# Patient Record
Sex: Female | Born: 1935 | Smoking: Former smoker
Health system: Southern US, Community
[De-identification: ages and names within clinical notes are randomized; demographics above are authoritative.]

## PROBLEM LIST (undated history)

## (undated) DIAGNOSIS — E785 Hyperlipidemia, unspecified: Secondary | ICD-10-CM

## (undated) DIAGNOSIS — I82409 Acute embolism and thrombosis of unspecified deep veins of unspecified lower extremity: Secondary | ICD-10-CM

## (undated) DIAGNOSIS — K5900 Constipation, unspecified: Secondary | ICD-10-CM

## (undated) DIAGNOSIS — I10 Essential (primary) hypertension: Secondary | ICD-10-CM

## (undated) DIAGNOSIS — M1990 Unspecified osteoarthritis, unspecified site: Secondary | ICD-10-CM

## (undated) DIAGNOSIS — E039 Hypothyroidism, unspecified: Secondary | ICD-10-CM

## (undated) DIAGNOSIS — I509 Heart failure, unspecified: Secondary | ICD-10-CM

## (undated) DIAGNOSIS — C541 Malignant neoplasm of endometrium: Secondary | ICD-10-CM

## (undated) HISTORY — PX: DILATION AND CURETTAGE OF UTERUS: SHX78

## (undated) HISTORY — DX: Unspecified osteoarthritis, unspecified site: M19.90

## (undated) HISTORY — DX: Hyperlipidemia, unspecified: E78.5

## (undated) HISTORY — DX: Malignant neoplasm of endometrium: C54.1

## (undated) HISTORY — PX: TONSILLECTOMY: SUR1361

---

## 2015-11-18 ENCOUNTER — Ambulatory Visit: Payer: BLUE CROSS/BLUE SHIELD | Admitting: Family

## 2016-01-27 ENCOUNTER — Ambulatory Visit: Payer: BLUE CROSS/BLUE SHIELD | Admitting: Family

## 2016-01-27 NOTE — H&P (Signed)
HPI:  Vickie Barton is a 82 y.o. [id number] here for female here for surgery. . Chief Complaint: post menopausal bleeding             Location:  uterus            Quality:  Heavy bleeding with clots            Severity:  moderate            Duration:  month            Timing:  Constant now            Modifying factors: nothing            Associated signs and symptoms:  Endorses weight loss, cramping, back pain,             Context:   Vickie Barton presented with first episode of PMB a couple weeks ago and it has progressively worsened.  She has lost 9 lbs in the last month.  She at first declined any intervention, but has since changed her mind as the cramping and pain have become bothersome.    She is s/p sonohystogram and endometrial biopsy.  Due to worsening of her symptoms, however, I would like to offer her a D&C.  Patient's last menstrual period was 1975               Problem List  Never Reviewed         Codes Priority Class Noted - Resolved   PMB (postmenopausal bleeding) ICD-10-CM: N95.0 ICD-9-CM: 627.1   Unknown - Present       Past Medical History:  has a past medical history of Hyperlipidemia, unspecified; Hypertension; and Thyroid disease.  Past Surgical History:  has a past surgical history that includes tonsillectomy. Family History: family history includes Diabetes mellitus in her father and paternal grandmother; Heart disease in her father and mother; Hypertension in her brother, father, mother, and sister; Stroke in her mother; Thyroid disease in her sister. Social History:  reports that she has quit smoking. She has never used smokeless tobacco. She reports that she does not drink alcohol or use drugs. OB/GYN History:          OB History    Gravida Para Term Preterm AB Living   5       5     SAB TAB Ectopic Molar Multiple Live Births   5                Allergies: is allergic to sulfa (sulfonamide antibiotics) and  zinc. Medications:  Current Outpatient Prescriptions:  .  ACETAMINOPHEN (TYLENOL ARTHRITIS ORAL), Take by mouth., Disp: , Rfl:  .  amLODIPine-benazepril (LOTREL) 5-20 mg capsule, Take 1 capsule by mouth once daily., Disp: , Rfl:  .  aspirin 81 MG EC tablet, Take 81 mg by mouth once daily., Disp: , Rfl:  .  hydroCHLOROthiazide (HYDRODIURIL) 25 MG tablet, Take 25 mg by mouth once daily., Disp: , Rfl:  .  levothyroxine (SYNTHROID, LEVOTHROID) 88 MCG tablet, Take 88 mcg by mouth once daily. Take on an empty stomach with a glass of water at least 30-60 minutes before breakfast., Disp: , Rfl:   Review of Systems: General:                      No fatigue or weight loss Eyes:  No vision changes Ears:                            No hearing difficulty Respiratory:                No cough or shortness of breath Pulmonary:                  No asthma or shortness of breath Cardiovascular:           No chest pain, palpitations, dyspnea on exertion Gastrointestinal:          No abdominal bloating, chronic diarrhea, constipations, masses, pain or hematochezia Genitourinary:             No hematuria, dysuria Lymphatic:                   No swollen lymph nodes Musculoskeletal:         No muscle weakness Neurologic:                  No extremity weakness, syncope, seizure disorder Psychiatric:                  No history of depression, delusions or suicidal/homicidal ideation    Exam:      Vitals:   01/27/16 1351  BP: 131/78  Pulse: 72    Body mass index is 33.63 kg/m.  WDWN white female in NAD   Lungs: normal respiratory effort, CTABL CV: RRR ABD: non tender, non distended Pelvic: tanner stage 5 ,              External genitalia: vulva /labia no lesions             Urethra: no prolapse, no diverticulum, no caruncle             Bladder: no tenderness to palpation, no cystocele             Vagina: normal physiologic d/c, no lesions, normal apical support              Cervix: no lesions, no cervical motion tenderness                 Ultrasound:  UT: 9x7x6cm with EE 64mm, heterogeneousm and with subserosal multiple fibroids LO: 4x2x2cm, RO 2x2x1cm No free fluid Poor distention with saline Endometrial biopsy collected.               Impression:   The encounter diagnosis was PMB (postmenopausal bleeding).    Plan:   - Given the thickness of the endometrium and the patient's current symptoms, I have offered her a D&C, hysteroscopy - I will also start her on Provera following her procedure -  The patient and I discussed the technical aspects of the procedure including the potential for risks and complications.These include but are not limited to the risk of infection requiring post-operative antibiotics or further procedures.We talked about the risk of injury to adjacent organs including bladder, bowel, ureter, blood vessels or nerves.We talked about the possibleneed for blood transfusion.We talked aboutpostop complications such asthromboembolic or cardiopulmonary complications.All of her questions were answered. Her preoperative exam was completed andthe appropriate consents were signed.     CHELSEA CRIST WARD, MD

## 2016-01-28 ENCOUNTER — Ambulatory Visit: Payer: Medicare Other | Admitting: Anesthesiology

## 2016-01-28 ENCOUNTER — Ambulatory Visit
Admission: RE | Admit: 2016-01-28 | Discharge: 2016-01-28 | Disposition: A | Discharge status: Home / Self Care | Payer: Medicare Other | Source: Ambulatory Visit | Attending: Obstetrics & Gynecology | Admitting: Obstetrics & Gynecology

## 2016-01-28 ENCOUNTER — Encounter: Payer: Self-pay | Admitting: *Deleted

## 2016-01-28 ENCOUNTER — Encounter
Admission: RE | Disposition: A | Discharge status: Home / Self Care | Payer: Self-pay | Source: Ambulatory Visit | Attending: Obstetrics & Gynecology

## 2016-01-28 DIAGNOSIS — Z7982 Long term (current) use of aspirin: Secondary | ICD-10-CM | POA: Insufficient documentation

## 2016-01-28 DIAGNOSIS — Z823 Family history of stroke: Secondary | ICD-10-CM | POA: Insufficient documentation

## 2016-01-28 DIAGNOSIS — N840 Polyp of corpus uteri: Secondary | ICD-10-CM | POA: Diagnosis not present

## 2016-01-28 DIAGNOSIS — I10 Essential (primary) hypertension: Secondary | ICD-10-CM | POA: Insufficient documentation

## 2016-01-28 DIAGNOSIS — Z833 Family history of diabetes mellitus: Secondary | ICD-10-CM | POA: Insufficient documentation

## 2016-01-28 DIAGNOSIS — E039 Hypothyroidism, unspecified: Secondary | ICD-10-CM | POA: Insufficient documentation

## 2016-01-28 DIAGNOSIS — Z882 Allergy status to sulfonamides status: Secondary | ICD-10-CM | POA: Diagnosis not present

## 2016-01-28 DIAGNOSIS — Z888 Allergy status to other drugs, medicaments and biological substances status: Secondary | ICD-10-CM | POA: Diagnosis not present

## 2016-01-28 DIAGNOSIS — E785 Hyperlipidemia, unspecified: Secondary | ICD-10-CM | POA: Diagnosis not present

## 2016-01-28 DIAGNOSIS — Z8249 Family history of ischemic heart disease and other diseases of the circulatory system: Secondary | ICD-10-CM | POA: Diagnosis not present

## 2016-01-28 DIAGNOSIS — N950 Postmenopausal bleeding: Secondary | ICD-10-CM | POA: Insufficient documentation

## 2016-01-28 DIAGNOSIS — Z87891 Personal history of nicotine dependence: Secondary | ICD-10-CM | POA: Insufficient documentation

## 2016-01-28 DIAGNOSIS — C541 Malignant neoplasm of endometrium: Secondary | ICD-10-CM

## 2016-01-28 HISTORY — PX: HYSTEROSCOPY WITH D & C: SHX1775

## 2016-01-28 HISTORY — DX: Malignant neoplasm of endometrium: C54.1

## 2016-01-28 HISTORY — DX: Hypothyroidism, unspecified: E03.9

## 2016-01-28 HISTORY — DX: Essential (primary) hypertension: I10

## 2016-01-28 LAB — TYPE AND SCREEN
ABO/RH(D): AB POS
Antibody Screen: NEGATIVE

## 2016-01-28 LAB — BASIC METABOLIC PANEL
Anion gap: 10 (ref 5–15)
BUN: 14 mg/dL (ref 6–20)
CHLORIDE: 98 mmol/L — AB (ref 101–111)
CO2: 30 mmol/L (ref 22–32)
CREATININE: 0.82 mg/dL (ref 0.44–1.00)
Calcium: 8.8 mg/dL — ABNORMAL LOW (ref 8.9–10.3)
GFR calc Af Amer: >60 mL/min (ref >60)
GFR calc non Af Amer: >60 mL/min (ref >60)
GLUCOSE: 106 mg/dL — AB (ref 65–99)
POTASSIUM: 3.1 mmol/L — AB (ref 3.5–5.1)
SODIUM: 138 mmol/L (ref 135–145)

## 2016-01-28 LAB — CBC
HCT: 36.1 % (ref 35.0–47.0)
Hemoglobin: 12.6 g/dL (ref 12.0–16.0)
MCH: 30.7 pg (ref 26.0–34.0)
MCHC: 35 g/dL (ref 32.0–36.0)
MCV: 87.9 fL (ref 80.0–100.0)
PLATELETS: 392 10*3/uL (ref 150–440)
RBC: 4.1 MIL/uL (ref 3.80–5.20)
RDW: 13.9 % (ref 11.5–14.5)
WBC: 6.4 10*3/uL (ref 3.6–11.0)

## 2016-01-28 SURGERY — DILATATION AND CURETTAGE /HYSTEROSCOPY
Anesthesia: General | Wound class: Clean Contaminated

## 2016-01-28 MED ORDER — FENTANYL CITRATE (PF) 100 MCG/2ML IJ SOLN
INTRAMUSCULAR | Status: AC
  Filled 2016-01-28: qty 2

## 2016-01-28 MED ORDER — LIDOCAINE HCL (PF) 1 % IJ SOLN
INTRAMUSCULAR | Status: AC
  Filled 2016-01-28: qty 30

## 2016-01-28 MED ORDER — MEDROXYPROGESTERONE ACETATE 10 MG PO TABS: 10.0000 mg | ORAL_TABLET | Freq: Two times a day (BID) | ORAL | 1 refills | Status: DC | PRN

## 2016-01-28 MED ORDER — LACTATED RINGERS IV SOLN
INTRAVENOUS | Status: DC | PRN
  Administered 2016-01-28: 10:00:00 via INTRAVENOUS

## 2016-01-28 MED ORDER — FENTANYL CITRATE (PF) 100 MCG/2ML IJ SOLN
25.0000 ug | INTRAMUSCULAR | Status: DC | PRN
  Administered 2016-01-28 (×4): 25 ug via INTRAVENOUS

## 2016-01-28 MED ORDER — ONDANSETRON HCL 4 MG/2ML IJ SOLN
INTRAMUSCULAR | Status: DC | PRN
  Administered 2016-01-28: 4 mg via INTRAVENOUS

## 2016-01-28 MED ORDER — LIDOCAINE HCL (CARDIAC) 20 MG/ML IV SOLN
INTRAVENOUS | Status: DC | PRN
  Administered 2016-01-28: 80 mg via INTRAVENOUS

## 2016-01-28 MED ORDER — OXYCODONE HCL 5 MG/5ML PO SOLN: 5.0000 mg | Freq: Once | ORAL | Status: DC | PRN

## 2016-01-28 MED ORDER — PROMETHAZINE HCL 25 MG/ML IJ SOLN
6.2500 mg | INTRAMUSCULAR | Status: DC | PRN
Start: 2016-01-28 — End: 2016-01-28

## 2016-01-28 MED ORDER — DEXAMETHASONE SODIUM PHOSPHATE 10 MG/ML IJ SOLN
INTRAMUSCULAR | Status: DC | PRN
  Administered 2016-01-28: 10 mg via INTRAVENOUS

## 2016-01-28 MED ORDER — MEPERIDINE HCL 25 MG/ML IJ SOLN: 6.2500 mg | INTRAMUSCULAR | Status: DC | PRN

## 2016-01-28 MED ORDER — PROPOFOL 10 MG/ML IV BOLUS
INTRAVENOUS | Status: DC | PRN
  Administered 2016-01-28: 150 mg via INTRAVENOUS

## 2016-01-28 MED ORDER — FENTANYL CITRATE (PF) 100 MCG/2ML IJ SOLN
INTRAMUSCULAR | Status: DC | PRN
  Administered 2016-01-28: 25 ug via INTRAVENOUS
  Administered 2016-01-28: 50 ug via INTRAVENOUS
  Administered 2016-01-28: 25 ug via INTRAVENOUS

## 2016-01-28 MED ORDER — OXYCODONE HCL 5 MG PO TABS: 5.0000 mg | ORAL_TABLET | Freq: Once | ORAL | Status: DC | PRN

## 2016-01-28 MED ORDER — KETOROLAC TROMETHAMINE 30 MG/ML IJ SOLN
INTRAMUSCULAR | Status: DC | PRN
  Administered 2016-01-28: 30 mg via INTRAVENOUS

## 2016-01-28 SURGICAL SUPPLY — 17 items
BAG INFUSER PRESSURE 100CC (MISCELLANEOUS) ×3 IMPLANT
ELECT REM PT RETURN 9FT ADLT (ELECTROSURGICAL) ×3
ELECTRODE REM PT RTRN 9FT ADLT (ELECTROSURGICAL) ×1 IMPLANT
GLOVE PI ORTHOPRO 6.5 (GLOVE) ×2
GLOVE PI ORTHOPRO STRL 6.5 (GLOVE) ×1 IMPLANT
GLOVE SURG SYN 6.5 ES PF (GLOVE) ×3 IMPLANT
GOWN STRL REUS W/ TWL LRG LVL3 (GOWN DISPOSABLE) ×2 IMPLANT
GOWN STRL REUS W/TWL LRG LVL3 (GOWN DISPOSABLE) ×4
IV LACTATED RINGERS 1000ML (IV SOLUTION) ×3 IMPLANT
KIT RM TURNOVER CYSTO AR (KITS) ×3 IMPLANT
NEEDLE SPNL 22GX3.5 QUINCKE BK (NEEDLE) ×3 IMPLANT
PACK DNC HYST (MISCELLANEOUS) ×3 IMPLANT
PAD OB MATERNITY 4.3X12.25 (PERSONAL CARE ITEMS) ×3 IMPLANT
PAD PREP 24X41 OB/GYN DISP (PERSONAL CARE ITEMS) ×3 IMPLANT
SYRINGE 10CC LL (SYRINGE) ×3 IMPLANT
TUBING CONNECTING 10 (TUBING) ×2 IMPLANT
TUBING CONNECTING 10' (TUBING) ×1

## 2016-01-28 NOTE — Op Note (Signed)
Operative Report Hysteroscopy, Dilation and Curettage 01/28/2016  Patient:  Vickie Barton  82 y.o. female Preoperative diagnosis:  PMB  Thickened Endometrium Postoperative diagnosis:  PMB  Thickened Endometrium, Endometrial polyps  PROCEDURE:  Procedure(s): DILATATION AND CURETTAGE /HYSTEROSCOPY (N/A) Surgeon:  Surgeon(s) and Role:    * Seryna Marek Loletha Grayer Cullin Dishman, MD - Primary Anesthesia:  LMA I/O: Total I/O In: 600 [I.V.:600] Out: -  Specimens:  Endometrial curettings Complications: None Apparent Disposition:  VS stable to PACU  Findings: Uterus, mobile, normal size, sounding to 9 cm; normal cervix, vagina, perineum.  Endometrium entirely replaced with fluffy polypoid tissue in cavity.    Indication for procedure/Consents: 82 y.o.  here for scheduled surgery for the aforementioned diagnoses. She has been bleeding with clots and significant pain.  Risks of surgery were discussed with the patient including but not limited to: bleeding which may require transfusion; infection which may require antibiotics; injury to uterus or surrounding organs; intrauterine scarring which may impair future fertility; need for additional procedures including laparotomy or laparoscopy; and other postoperative/anesthesia complications. Written informed consent was obtained.    Procedure Details:   The patient was then taken to the operating room where anesthesia was administered and was found to be adequate.  After a formal and adequate timeout was performed, she was placed in the dorsal lithotomy position and examined with the above findings. She was then prepped and draped in the sterile manner.  A speculum was then placed in the patient's vagina and a single tooth tenaculum was applied to the anterior lip of the cervix.    The uterus was sounded to 9 cm. Her cervix was serially dilated to accommodate the hysteroscope, with findings as above. Forceps were used to evacuate the polyp tissue. A sharp curettage was then  performed until there was a gritty texture in all four quadrants. The specimens were handed off to nursing.  The camera was reinserted and confirmed the uterus had been evacuated. The tenaculum was removed from the anterior lip of the cervix and the vaginal speculum was removed after noting good hemostasis. The patient tolerated the procedure well and was taken to the recovery area awake, extubated and in stable condition.  The patient will be discharged to home as per PACU criteria.  Routine postoperative instructions given. She will follow up in the clinic in two to four weeks for postoperative evaluation.  Larey Days, MD [facility] OBGYN Attending Gynecologist

## 2016-01-28 NOTE — Transfer of Care (Signed)
Immediate Anesthesia Transfer of Care Note  Patient: Vickie Barton  Procedure(s) Performed: Procedure(s): DILATATION AND CURETTAGE /HYSTEROSCOPY (N/A)  Patient Location: PACU  Anesthesia Type:General  Level of Consciousness: awake, alert  and oriented  Airway & Oxygen Therapy: Patient connected to face mask oxygen  Post-op Assessment: Post -op Vital signs reviewed and stable  Post vital signs: stable  Last Vitals:  Vitals:   01/28/16 0842 01/28/16 1117  BP: (!) 145/70 (!) 182/82  Pulse: 79 66  Resp: 16 17  Temp: 36.8 C 36.9 C    Last Pain:  Vitals:   01/28/16 1117  TempSrc: Temporal         Complications: No apparent anesthesia complications

## 2016-01-28 NOTE — Anesthesia Preprocedure Evaluation (Signed)
Anesthesia Evaluation  Patient identified by MRN, date of birth, ID band Patient awake    Reviewed: Allergy & Precautions, NPO status , Patient's Chart, lab work & pertinent test results  History of Anesthesia Complications Negative for: history of anesthetic complications  Airway Mallampati: III  TM Distance: >3 FB Neck ROM: Full    Dental  (+) Poor Dentition, Chipped   Pulmonary neg pulmonary ROS, neg sleep apnea, neg COPD,    breath sounds clear to auscultation- rhonchi (-) wheezing      Cardiovascular hypertension, Pt. on medications (-) CAD and (-) Past MI  Rhythm:Regular Rate:Normal - Systolic murmurs and - Diastolic murmurs    Neuro/Psych negative neurological ROS  negative psych ROS   GI/Hepatic negative GI ROS, Neg liver ROS,   Endo/Other  neg diabetesHypothyroidism   Renal/GU negative Renal ROS     Musculoskeletal negative musculoskeletal ROS (+)   Abdominal (+) + obese,   Peds  Hematology negative hematology ROS (+)   Anesthesia Other Findings Past Medical History: No date: Hypertension No date: Hypothyroidism   Reproductive/Obstetrics Post menopausal bleeding                             Anesthesia Physical Anesthesia Plan  ASA: II  Anesthesia Plan: General   Post-op Pain Management:    Induction: Intravenous  Airway Management Planned: LMA and Oral ETT  Additional Equipment:   Intra-op Plan:   Post-operative Plan: Extubation in OR  Informed Consent: I have reviewed the patients History and Physical, chart, labs and discussed the procedure including the risks, benefits and alternatives for the proposed anesthesia with the patient or authorized representative who has indicated his/her understanding and acceptance.   Dental advisory given  Plan Discussed with: CRNA and Anesthesiologist  Anesthesia Plan Comments:         Anesthesia Quick Evaluation

## 2016-01-28 NOTE — Anesthesia Postprocedure Evaluation (Signed)
Anesthesia Post Note  Patient: Vickie Barton  Procedure(s) Performed: Procedure(s) (LRB): DILATATION AND CURETTAGE /HYSTEROSCOPY (N/A)  Patient location during evaluation: PACU Anesthesia Type: General Level of consciousness: awake and alert and oriented Pain management: pain level controlled Vital Signs Assessment: post-procedure vital signs reviewed and stable Respiratory status: spontaneous breathing, nonlabored ventilation and respiratory function stable Cardiovascular status: blood pressure returned to baseline and stable Postop Assessment: no signs of nausea or vomiting Anesthetic complications: no    Last Vitals:  Vitals:   01/28/16 1157 01/28/16 1230  BP: (!) 186/71 (!) 164/68  Pulse: (!) 58 60  Resp: 16 16  Temp: 36.4 C     Last Pain:  Vitals:   01/28/16 1157  TempSrc: Temporal  PainSc:                  [name]

## 2016-01-28 NOTE — Discharge Instructions (Signed)
AMBULATORY SURGERY  DISCHARGE INSTRUCTIONS   1) The drugs that you were given will stay in your system until tomorrow so for the next 24 hours you should not:  A) Drive an automobile B) Make any legal decisions C) Drink any alcoholic beverage   2) You may resume regular meals tomorrow.  Today it is better to start with liquids and gradually work up to solid foods.  You may eat anything you prefer, but it is better to start with liquids, then soup and crackers, and gradually work up to solid foods.   3) Please notify your doctor immediately if you have any unusual bleeding, trouble breathing, redness and pain at the surgery site, drainage, fever, or pain not relieved by medication. 4)   5) Your post-operative visit with Dr.                                     is: Date:                        Time:    Please call to schedule your post-operative visit.  6) Additional Instructions:      You should expect to have some cramping and vaginal bleeding for about a week. This should taper off and subside, much like a period. If heavy bleeding continues or gets worse, you should contact the office for an earlier appointment.   Please call the office or physician on call for fever >101, severe pain, and heavy bleeding.   [phone]  NOTHING IN THE VAGINA FOR 2 WEEKS!!

## 2016-01-28 NOTE — H&P (Signed)
H&P Update   Pt was last seen in my office, and complete history and physical performed.  The surgical history has been reviewed and remains accurate without interval change. The patient was re-examined and patient's physiologic condition has not changed significantly in the last day.  No new pharmacological allergies or types of therapy has been initiated.  Allergies  Allergen Reactions  . Sulfa Antibiotics Rash  . Zinc Rash    Past Medical History:  Diagnosis Date  . Hypertension   . Hypothyroidism    Past Surgical History:  Procedure Laterality Date  . TONSILLECTOMY      BP (!) 145/70   Pulse 79   Temp 98.3 F (36.8 C) (Oral)   Resp 16   Ht 5' 1.5" (1.562 m)   Wt 79.4 kg (175 lb)   SpO2 97%   BMI 32.53 kg/m   NAD RRR no murmurs CTAB, no wheezing, resps unlabored +BS, soft, NTTP No c/c/e Pelvic exam deferred  The above history was confirmed with the patient. The condition still exists that makes this procedure necessary. Surgical plan includes Hysteroscopy, Dilation and Curettage, as confirmed on the consent. The treatment plan remains the same, without new options for care.  The patient understands the potential benefits and risks and the consents have been signed and placed on the chart.     Larey Days, MD Attending Obstetrician Gynecologist [facility], Department of [facility]

## 2016-01-28 NOTE — Anesthesia Procedure Notes (Signed)
Procedure Name: LMA Insertion Date/Time: 01/28/2016 10:32 AM Performed by: Aline Brochure Pre-anesthesia Checklist: Patient identified, Emergency Drugs available, Suction available and Patient being monitored Patient Re-evaluated:Patient Re-evaluated prior to inductionOxygen Delivery Method: Circle system utilized Preoxygenation: Pre-oxygenation with 100% oxygen Intubation Type: IV induction Ventilation: Mask ventilation without difficulty LMA: LMA inserted LMA Size: 3.5 Number of attempts: 1 Placement Confirmation: positive ETCO2 and breath sounds checked- equal and bilateral Tube secured with: Tape Dental Injury: Teeth and Oropharynx as per pre-operative assessment

## 2016-01-29 ENCOUNTER — Encounter: Payer: Self-pay | Admitting: Obstetrics & Gynecology

## 2016-01-30 ENCOUNTER — Other Ambulatory Visit: Payer: Self-pay | Admitting: Obstetrics & Gynecology

## 2016-01-30 DIAGNOSIS — C541 Malignant neoplasm of endometrium: Secondary | ICD-10-CM

## 2016-02-03 LAB — SURGICAL PATHOLOGY

## 2016-02-19 ENCOUNTER — Inpatient Hospital Stay: Payer: Medicare Other | Attending: Obstetrics and Gynecology | Admitting: Obstetrics and Gynecology

## 2016-02-19 VITALS — BP 153/74 | HR 75 | Temp 98.2°F | Ht 61.5 in | Wt 176.0 lb

## 2016-02-19 DIAGNOSIS — E039 Hypothyroidism, unspecified: Secondary | ICD-10-CM | POA: Diagnosis not present

## 2016-02-19 DIAGNOSIS — Z87891 Personal history of nicotine dependence: Secondary | ICD-10-CM | POA: Diagnosis not present

## 2016-02-19 DIAGNOSIS — Z7982 Long term (current) use of aspirin: Secondary | ICD-10-CM

## 2016-02-19 DIAGNOSIS — I10 Essential (primary) hypertension: Secondary | ICD-10-CM | POA: Insufficient documentation

## 2016-02-19 DIAGNOSIS — B373 Candidiasis of vulva and vagina: Secondary | ICD-10-CM | POA: Diagnosis not present

## 2016-02-19 DIAGNOSIS — E785 Hyperlipidemia, unspecified: Secondary | ICD-10-CM | POA: Insufficient documentation

## 2016-02-19 DIAGNOSIS — E876 Hypokalemia: Secondary | ICD-10-CM | POA: Diagnosis not present

## 2016-02-19 DIAGNOSIS — C541 Malignant neoplasm of endometrium: Secondary | ICD-10-CM | POA: Insufficient documentation

## 2016-02-19 DIAGNOSIS — Z79899 Other long term (current) drug therapy: Secondary | ICD-10-CM | POA: Insufficient documentation

## 2016-02-19 NOTE — Patient Instructions (Signed)
Total Laparoscopic Hysterectomy A total laparoscopic hysterectomy is a minimally invasive surgery to remove your uterus and cervix. This surgery is performed by making several small cuts (incisions) in your abdomen. It can also be done with a thin, lighted tube (laparoscope) inserted into two small incisions in your lower abdomen. Your fallopian tubes and ovaries can be removed (bilateral salpingo-oophorectomy) during this surgery as well.Benefits of minimally invasive surgery include:  Less pain.  Less risk of blood loss.  Less risk of infection.  Quicker return to normal activities. Tell a health care provider about:  Any allergies you have.  All medicines you are taking, including vitamins, herbs, eye drops, creams, and over-the-counter medicines.  Any problems you or family members have had with anesthetic medicines.  Any blood disorders you have.  Any surgeries you have had.  Any medical conditions you have. What are the risks? Generally, this is a safe procedure. However, as with any procedure, complications can occur. Possible complications include:  Bleeding.  Blood clots in the legs or lung.  Infection.  Injury to surrounding organs.  Problems with anesthesia.  Early menopause symptoms (hot flashes, night sweats, insomnia).  Risk of conversion to an open abdominal incision. What happens before the procedure?  Ask your health care provider about changing or stopping your regular medicines.  Do not take aspirin or blood thinners (anticoagulants) for 1 week before the surgery or as told by your health care provider.  Do not eat or drink anything for 8 hours before the surgery or as told by your health care provider.  Quit smoking if you smoke.  Arrange for a ride home after surgery and for someone to help you at home during recovery. What happens during the procedure?  You will be given antibiotic medicine.  An IV tube will be placed in your arm. You will  be given medicine to make you sleep (general anesthetic).  A gas (carbon dioxide) will be used to inflate your abdomen. This will allow your surgeon to look inside your abdomen, perform your surgery, and treat any other problems found if necessary.  Three or four small incisions (often less than 1/2 inch) will be made in your abdomen. One of these incisions will be made in the area of your belly button (navel). The laparoscope will be inserted into the incision. Your surgeon will look through the laparoscope while doing your procedure.  Other surgical instruments will be inserted through the other incisions.  Your uterus may be removed through your vagina or cut into small pieces and removed through the small incisions.  Your incisions will be closed. What happens after the procedure?  The gas will be released from inside your abdomen.  You will be taken to the recovery area where a nurse will watch and check your progress. Once you are awake, stable, and taking fluids well, without other problems, you will return to your room or be allowed to go home.  There is usually minimal discomfort following the surgery because the incisions are so small.  You will be given pain medicine while you are in the hospital and for when you go home. This information is not intended to replace advice given to you by your health care provider. Make sure you discuss any questions you have with your health care provider. Document Released: 01/04/2007 Document Revised: 08/15/2015 Document Reviewed: 09/27/2012 Elsevier Interactive Patient Education  2017 [place].   Uterine Cancer Uterine cancer is an abnormal growth of tissue (tumor) in the  uterus that is cancerous (malignant). Unlike noncancerous (benign) tumors, malignant tumors can spread to other parts of your body. The wall of the uterus has two layers of tissue. The inner layer is the endometrium. The outer layer of muscle tissue is the myometrium.  The most common type of uterine cancer begins in the endometrium. This is called endometrial cancer. Cancer that begins in the myometrium is called uterine sarcoma, which is very rare. What increases the risk? Although the exact cause of uterine cancer is unknown, there are a number of risk factors that can increase your chances of getting uterine cancer. They include:  Your age. Uterine cancer occurs mostly in women older than 50 years.  Having an enlarged endometrium (endometrial hyperplasia).  Using hormone therapy.  Obesity.  Taking the drug tamoxifen.  White race.  Infertility.  Never being pregnant.  Beginning menstrual periods at an age younger than 12 years.  Having menstrual periods at an age older than 36 years.  Personal history of ovarian, intestinal, or colorectal cancer.  Having a family history of uterine cancer.  Having a family history of hereditary nonpolyposis colon cancer (HNPCC).  Having diabetes, high blood pressure, thyroid disease, or gallbladder disease.  Long-term use of high-dose birth control pills.  Exposure to radiation.  Smoking. What are the signs or symptoms?  Abnormal vaginal bleeding or discharge. Bleeding may start as a watery, blood-streaked flow that gradually contains more blood.  Any vaginal bleeding after menopause.  Difficult or painful urination.  Pain during intercourse.  Pain in the pelvic area.  Mass in the vagina.  Pain or fullness in the abdomen.  Frequent urination.  Bleeding between periods.  Growth of the stomach.  Unexplained weight loss. Uterine cancer usually occurs after menopause. However, it may also occur around the time that menopause begins. Abnormal vaginal bleeding is the most common symptom of uterine cancer. Women should not assume that abnormal vaginal bleeding is part of menopause. How is this diagnosed? Your health care provider will ask about your medical history. He or she may also  perform a number of procedures, such as:  A physical and pelvic exam. Your health care provider will feel your pelvis for any lumps.  Blood and urine tests.  X-rays.  Imaging tests, such as CT scans, ultrasonography, or MRIs.  A hysteroscopy to view the inside of your uterus.  A Pap test to sample cells from the cervix and upper vagina to check for abnormal cells.  Taking a tissue sample (biopsy) from the uterine lining to look for cancer cells.  A dilation and curettage (D&C). This involves stretching (dilation) the cervix and scraping (curettage) the inside lining of the uterus to get a tissue sample. The sample is examined under a microscope to look for cancer cells. Your cancer will be staged to determine its severity and extent. Staging is a careful attempt to find out the size of the tumor, whether the cancer has spread, and if so, to what parts of the body. You may need to have more tests to determine the stage of your cancer. The test results will help determine what treatment plan is best for you. Cancer stages include:  Stage I. The cancer is only found in the uterus.  Stage II. The cancer has spread to the cervix.  Stage III. The cancer has spread outside the uterus, but not outside the pelvis. The cancer may have spread to the lymph nodes in the pelvis.  Stage IV. The cancer has spread  to other parts of the body, such as the bladder or rectum. How is this treated? Most women with uterine cancer are treated with surgery. This includes removing the uterus, cervix, fallopian tubes, and ovaries (total hysterectomy). Your lymph nodes near the tumor may also be removed. Some women have radiation, chemotherapy, or hormonal therapy. Other women have a combination of these therapies. Follow these instructions at home:  Take medicines only as directed by your health care provider.  Maintain a healthy diet.  Exercise regularly.  If you have diabetes, high blood pressure, thyroid  disease, or gallbladder disease, follow your health care provider's instructions to keep it under control.  Do not smoke.  Consider joining a support group. This may help you learn to cope with the stress of having uterine cancer.  Seek advice to help you manage treatment side effects.  Keep all follow-up visits as directed by your health care provider. Contact a health care provider if:  You have increased stomach or pelvic pain.  You cannot urinate.  You have abnormal bleeding. This information is not intended to replace advice given to you by your health care provider. Make sure you discuss any questions you have with your health care provider. Document Released: 03/09/2005 Document Revised: 08/15/2015 Document Reviewed: 08/26/2012 Elsevier Interactive Patient Education  2017 [place].    Skin Yeast Infection Skin yeast infection is a condition in which there is an overgrowth of yeast (candida) that normally lives on the skin. This condition usually occurs in areas of the skin that are constantly warm and moist, such as the armpits or the groin. What are the causes? This condition is caused by a change in the normal balance of the yeast and bacteria that live on the skin. What increases the risk? This condition is more likely to develop in:  People who are obese.  Pregnant women.  Women who take birth control pills.  People who have diabetes.  People who take antibiotic medicines.  People who take steroid medicines.  People who are malnourished.  People who have a weak defense (immune) system.  People who are 80 years of age or older. What are the signs or symptoms? Symptoms of this condition include:  A red, swollen area of the skin.  Bumps on the skin.  Itchiness. How is this diagnosed? This condition is diagnosed with a medical history and physical exam. Your health care provider may check for yeast by taking light scrapings of the skin to be viewed  under a microscope. How is this treated? This condition is treated with medicine. Medicines may be prescribed or be available over-the-counter. The medicines may be:  Taken by mouth (orally).  Applied as a cream. Follow these instructions at home:  Take or apply over-the-counter and prescription medicines only as told by your health care provider.  Eat more yogurt. This may help to keep your yeast infection from returning.  Maintain a healthy weight. If you need help losing weight, talk with your health care provider.  Keep your skin clean and dry.  If you have diabetes, keep your blood sugar under control. Contact a health care provider if:  Your symptoms go away and then return.  Your symptoms do not get better with treatment.  Your symptoms get worse.  Your rash spreads.  You have a fever or chills.  You have new symptoms.  You have new warmth or redness of your skin. This information is not intended to replace advice given to you by  your health care provider. Make sure you discuss any questions you have with your health care provider. Document Released: 11/25/2010 Document Revised: 11/03/2015 Document Reviewed: 09/10/2014 Elsevier Interactive Patient Education  2017 [place].   Hypokalemia Hypokalemia means that the amount of potassium in the blood is lower than normal.Potassium is a chemical that helps regulate the amount of fluid in the body (electrolyte). It also stimulates muscle tightening (contraction) and helps nerves work properly.Normally, most of the body's potassium is inside of cells, and only a very small amount is in the blood. Because the amount in the blood is so small, minor changes to potassium levels in the blood can be life-threatening. What are the causes? This condition may be caused by:  Antibiotic medicine.  Diarrhea or vomiting. Taking too much of a medicine that helps you have a bowel movement (laxative) can cause diarrhea and lead to  hypokalemia.  Chronic kidney disease (CKD).  Medicines that help the body get rid of excess fluid (diuretics).  Eating disorders, such as bulimia.  Low magnesium levels in the body.  Sweating a lot. What are the signs or symptoms? Symptoms of this condition include:  Weakness.  Constipation.  Fatigue.  Muscle cramps.  Mental confusion.  Skipped heartbeats or irregular heartbeat (palpitations).  Tingling or numbness. How is this diagnosed? This condition is diagnosed with a blood test. How is this treated? Hypokalemia can be treated by taking potassium supplements by mouth or adjusting the medicines that you take. Treatment may also include eating more foods that contain a lot of potassium. If your potassium level is very low, you may need to get potassium through an IV tube in one of your veins and be monitored in the hospital. Follow these instructions at home:  Take over-the-counter and prescription medicines only as told by your health care provider. This includes vitamins and supplements.  Eat a healthy diet. A healthy diet includes fresh fruits and vegetables, whole grains, healthy fats, and lean proteins.  If instructed, eat more foods that contain a lot of potassium, such as:  Nuts, such as peanuts and pistachios.  Seeds, such as sunflower seeds and pumpkin seeds.  Peas, lentils, and lima beans.  Whole grain and bran cereals and breads.  Fresh fruits and vegetables, such as apricots, avocado, bananas, cantaloupe, kiwi, oranges, tomatoes, asparagus, and potatoes.  Orange juice.  Tomato juice.  Red meats.  Yogurt.  Keep all follow-up visits as told by your health care provider. This is important. Contact a health care provider if:  You have weakness that gets worse.  You feel your heart pounding or racing.  You vomit.  You have diarrhea.  You have diabetes (diabetes mellitus) and you have trouble keeping your blood sugar (glucose) in your  target range. Get help right away if:  You have chest pain.  You have shortness of breath.  You have vomiting or diarrhea that lasts for more than 2 days.  You faint. This information is not intended to replace advice given to you by your health care provider. Make sure you discuss any questions you have with your health care provider. Document Released: 03/09/2005 Document Revised: 10/26/2015 Document Reviewed: 10/26/2015 Elsevier Interactive Patient Education  2017 [employer].

## 2016-02-19 NOTE — Progress Notes (Signed)
Patient here for initial visit. States she has a moderate of discharge using three pads a day. Her blood pressure is elevated patient states her blood pressure is usually much higher. She has lost 30 pounds this past year.

## 2016-02-19 NOTE — Progress Notes (Signed)
  Oncology Nurse Navigator Documentation Copy of signed consent faxed to [facility] at [facility]. [id number] Navigator Location: [facility] (02/19/16 1500)   )Navigator Encounter Type: Letter/Fax/Email (02/19/16 1500)                                                    Time Spent with Patient: 15 (02/19/16 1500)

## 2016-02-19 NOTE — Progress Notes (Signed)
[facility] Oncology Consult Visit   Referring Provider: Maceo Barton, [place]ick[place] Barton, [place] [address] [phone]   Chief Concern: endometrial cancer  Subjective:  Vickie Barton is a 82 y.o. female who is seen in consultation from Dr. Leonides Schanz for grade 2 endometrial cancer.   She presented with postmenopausal bleeding characterized Barton heavy bleeding with clots. Her evaluation included the following:   01/27/2016 sonohystogram and endometrial biopsy  Ultrasound:  UT: 9x7x6cm with EE 54m, heterogeneousm and with subserosal multiple fibroids LO: 4x2x2cm, RO 2x2x1cm No free fluid Poor distention with saline Endometrial biopsy collected - HIGH GRADE CARCINOMA, FAVOR SEROUS  Due to heavy bleeding the patient underwent D&C Hysteroscopy on 01/28/2016. The uterus sounded to 9 cm and uterine cavity filled with fluffy polypoid tissue.   Surgical Pathology  CASE: [id number]  PATIENT: Vickie Barton Surgical Pathology Report      SPECIMEN SUBMITTED:  A. Endometrial polyps  B. Endometrial curettings   CLINICAL HISTORY:  None provided   PRE-OPERATIVE DIAGNOSIS:  Post-menopausal bleeding, thickened endometrium   POST-OPERATIVE DIAGNOSIS:  Same as pre-op      DIAGNOSIS:  A. ENDOMETRIAL POLYPS; CURETTAGE:  - SEROUS CARCINOMA.  - ENDOMETRIAL POLYP FRAGMENTS.   B. ENDOMETRIUM; CURETTAGE:  - SEROUS CARCINOMA.  - ENDOMETRIAL POLYP FRAGMENTS.   Comment:  Much of the tumor shows a poorly differentiated solid growth pattern,  but there is focal differentiation morphologically consistent with  serous carcinoma. Immunohistochemistry (IHC) was performed for further  characterization. The neoplastic cells demonstrate aberrant staining for  p53, with diffuse strong nuclear staining. There is diffuse strong  nuclear and cytoplasmic staining for p16. These features support the  above diagnosis.   IHC for DNA mismatch repair (MMR) was also  performed, and the tumor  shows intact nuclear expression of MLH1, MSH2, MSH6, and PMS2. The  intact expression of MMR proteins indicates a low probability of MSI-H.  Serous carcinomas usually show intact MMR expression.   The serous carcinoma appears to involve fragments of endometrial polyps,  consistent with primary uterine serous carcinoma. Correlation with  clinical and imaging findings is recommended.        She has a CT scan ordered for tomorrow.  She is scheduled for surgery on 02/26/2016 with Drs. BCoolidgeand Ward. She presents today for preoperative assessment.     Problem List: Patient Active Problem List   Diagnosis Date Noted  . Endometrial cancer ([facility] 02/19/2016    Past Medical History: Past Medical History:  Diagnosis Date  . Arthritis   . Endometrial cancer ([facility] 01/28/2016  . Hyperlipidemia   . Hypertension   . Hypothyroidism     Past Surgical History: Past Surgical History:  Procedure Laterality Date  . HYSTEROSCOPY W/D&C N/A 01/28/2016   Procedure: DILATATION AND CURETTAGE /HYSTEROSCOPY;  Surgeon: CHonor LohWard, Vickie;  Location: [facility] ORS;  Service: Gynecology;  Laterality: N/A;  . TONSILLECTOMY      Past Vickie History:  Menarche: 18 History of Abnormal pap: no, no abnormalities Last mammogram: in her 387's  OB History:  OB History  Gravida Para Term Preterm AB Living  0 0          SAB TAB Ectopic Multiple Live Births                   Family History: Family History  Problem Relation Age of Onset  . Diabetes Father   . Heart disease Father   . Hypertension Father   .  Diabetes Paternal Grandmother   . Heart disease Mother   . Hypertension Mother   . Stroke Mother   . Hypertension Sister   . Thyroid disease Sister   . Hypertension Brother     Social History: Social History   Social History  . Marital status: Widowed    Spouse name: N/A  . Number of children: N/A  . Years of education: N/A   Occupational History  .  Not on file.   Social History Main Topics  . Smoking status: Former Research scientist (life sciences)  . Smokeless tobacco: Never Used  . Alcohol use No  . Drug use: No  . Sexual activity: Not on file   Other Topics Concern  . Not on file   Social History Narrative  . No narrative on file    Allergies: Allergies  Allergen Reactions  . Sulfa Antibiotics Rash  . Zinc Rash    Current Medications: Current Outpatient Prescriptions  Medication Sig Dispense Refill  . acetaminophen (TYLENOL) 325 MG tablet Take 650 mg Barton mouth every 6 (six) hours as needed for moderate pain.    [name] amLODipine-benazepril (LOTREL) 5-20 MG capsule Take 1 capsule Barton mouth daily.    [name] aspirin 81 MG chewable tablet Chew 81 mg Barton mouth daily.    . hydrochlorothiazide (HYDRODIURIL) 25 MG tablet Take 25 mg Barton mouth daily.    [name] levothyroxine (SYNTHROID, LEVOTHROID) 88 MCG tablet Take 88 mcg Barton mouth daily before breakfast.    . medroxyPROGESTERone (PROVERA) 10 MG tablet Take 1 tablet (10 mg total) Barton mouth 3 times/day as needed-between meals & bedtime. 60 tablet 1   No current facility-administered medications for this visit.     Review of Systems General: negative for, fevers, changes in weight or appetite. Intentional weight loss of 30 pounds.  Skin: negative for changes in color, texture, moles or lesions Eyes: negative for, changes in vision HEENT: negative for, change in hearing, tinnitus, sore throat Pulmonary: negative for, dyspnea, productive cough Cardiac: negative for, palpitations, pain Gastrointestinal: negative for, nausea, vomiting, constipation, diarrhea, hematemesis, hematochezia Genitourinary/Sexual: negative for, dysuria, hesitancy, retention, infections Ob/Gyn: irregular bleeding Musculoskeletal: positive for left knee pain. She did have left lower extremity swelling in [place] but that has resolved.  Hematology: negative for, easy bruising, bleeding Neurologic/Psych: negative for, headaches, seizures, paralysis,  weakness  Objective:  Physical Examination:  BP (!) 153/74 (BP Location: Left Arm, Patient Position: Sitting)   Pulse 75   Temp 98.2 F (36.8 C) (Tympanic)   Ht 5' 1.5" (1.562 m)   Wt 176 lb (79.8 kg)   BMI 32.72 kg/m    ECOG Performance Status: 1 - Symptomatic but completely ambulatory  General appearance: alert, cooperative and appears stated age HEENT:PERRLA, extra ocular movement intact and sclera clear, anicteric Lymph node survey: non-palpable, axillary, inguinal, supraclavicular Breast exam: Cardiovascular: regular rate and rhythm Respiratory: normal air entry, lungs clear to auscultation Abdomen: soft, protuberant, nondistended, nontender, no masses palpated, no ascites and no hernias Back: inspection of back is normal Extremities: extremities normal, atraumatic, no cyanosis or edema. No cords or Homan's sign. Skin exam - normal coloration and turgor, no rashes, no suspicious skin lesions noted. Neurological exam reveals alert, oriented, normal speech, no focal findings or movement disorder noted.  Pelvic: exam chaperoned Barton nurse;  Vulva: vulvar erythema otherwise normal; Vagina: normal vagina; Adnexa: unable to determine size due to habitus, nontender and no masses; Uterus: uterus is normal size, shape, may be retroverted approximately 10 cm and nontender; Cervix: no  lesions, nulliparous; Rectal: not indicated    Lab Review   Chemistry      Component Value Date/Time   NA 138 01/28/2016 0809   K 3.1 (L) 01/28/2016 0809   CL 98 (L) 01/28/2016 0809   CO2 30 01/28/2016 0809   BUN 14 01/28/2016 0809   CREATININE 0.82 01/28/2016 0809      Component Value Date/Time   CALCIUM 8.8 (L) 01/28/2016 0809     Lab Results  Component Value Date   WBC 6.4 01/28/2016   HGB 12.6 01/28/2016   HCT 36.1 01/28/2016   MCV 87.9 01/28/2016   PLT 392 01/28/2016    Radiologic Imaging: CT tomorrow    Assessment:  Jahara Dail is a 82 y.o. female diagnosed with high grade  serous endometrial cancer.  Hypokalemia. Vulvar candidiasis, symptomatic.  Medical co-morbidities complicating care: HTN and obesity. Body mass index is 32.72 kg/m.  Plan:   Problem List Items Addressed This Visit      Genitourinary   Endometrial cancer ([place]) - Primary      We discussed options for management including surgery, radiation, and hormonal therapy. Based on her diagnosis and no contraindications for surgery, we recommend definitive surgical evaluation. We will follow up on CT scan. We will contact Dr. Leonides Schanz about her potassium levels which can be rechecked preop. We gave her information about dietary supplementation today. She will stop the baby aspirin today.   On exam there is no evidence of assymetrical leg swelling. We discussed risks of VTE.   Vulvar candidiasis - she would like to try diflucan for treatment of yeast infection, but that is not on formulary for her. Therefore I recommended topical anti-yeast creams that she can purchase over the counter.  Risks were discussed in detail. These include infection, anesthesia, bleeding, transfusion, wound separation, vaginal cuff dehiscence, medical issues (blood clots, stroke, heart attack, fluid in the lungs, pneumonia, abnormal heart rhythm, death), possible exploratory surgery with larger incision, lymphedema, lymphocyst, allergic reaction, injury to adjacent organs (bowel, bladder, blood vessels, nerves, ureters, uterus)    Suggested return to clinic in  6 weeks for postoperative visit.    The patient's diagnosis, an outline of the further diagnostic and laboratory studies which will be required, the recommendation, and alternatives were discussed.  All questions were answered to the patient's satisfaction.  A total of 60 minutes were spent with the patient/family today; 50% was spent in education, counseling and coordination of care for endometrial cancer.    Gillis Ends, Vickie    CC:  Maceo Pro,  [place], [place] [address] [phone]

## 2016-02-19 NOTE — Progress Notes (Signed)
  Oncology Nurse Navigator Documentation Chaperoned pelvic exam. Surgery has been arranged by Dr Guido Sander office for 12/6 with Dr. Andreas Ohm Navigator Location: [facility] (02/19/16 1100)   )Navigator Encounter Type: Initial GynOnc (02/19/16 1100)                                                    Time Spent with Patient: 30 (02/19/16 1100)

## 2016-02-20 ENCOUNTER — Ambulatory Visit
Admission: RE | Admit: 2016-02-20 | Discharge: 2016-02-20 | Disposition: A | Discharge status: Home / Self Care | Payer: Medicare Other | Source: Ambulatory Visit | Attending: Obstetrics & Gynecology | Admitting: Obstetrics & Gynecology

## 2016-02-20 DIAGNOSIS — K5730 Diverticulosis of large intestine without perforation or abscess without bleeding: Secondary | ICD-10-CM | POA: Insufficient documentation

## 2016-02-20 DIAGNOSIS — C541 Malignant neoplasm of endometrium: Secondary | ICD-10-CM | POA: Diagnosis present

## 2016-02-20 DIAGNOSIS — I2510 Atherosclerotic heart disease of native coronary artery without angina pectoris: Secondary | ICD-10-CM | POA: Diagnosis not present

## 2016-02-20 DIAGNOSIS — I700 Atherosclerosis of aorta: Secondary | ICD-10-CM | POA: Diagnosis not present

## 2016-02-20 DIAGNOSIS — K449 Diaphragmatic hernia without obstruction or gangrene: Secondary | ICD-10-CM | POA: Insufficient documentation

## 2016-02-20 DIAGNOSIS — D71 Functional disorders of polymorphonuclear neutrophils: Secondary | ICD-10-CM | POA: Diagnosis not present

## 2016-02-20 MED ORDER — IOPAMIDOL (ISOVUE-300) INJECTION 61%
100.0000 mL | Freq: Once | INTRAVENOUS | Status: AC | PRN
  Administered 2016-02-20: 100 mL via INTRAVENOUS

## 2016-02-24 NOTE — H&P (Signed)
Chief Complaint:    Patient ID: Vickie Barton is a 82 y.o. female presenting with Pre Op Consulting   HPI: Vickie Barton was diagnosed with endometrial cancer after presenting with post menopausal bleeding.  She had an ultrasound that showed a mass in the endometrium with a heterogenous uterus.  She was heavily bleeding and was brought to the OR for a D&C.  The specimen from this procedure showed serous cancer, undifferentiated type but favoring uterine origin.  A CT scan was obtained that showed a heterogenous uterus and a 4cm left ovarian mass.  No lymphadenopathy in the pelvis or para-aortic.  She continues to have bleeding.    Past Medical History:  has a past medical history of Cancer (CMS-HCC); Hyperlipidemia, unspecified; Hypertension; and Thyroid disease.  Past Surgical History:  has a past surgical history that includes tonsillectomy. Family History: family history includes Diabetes in her father and paternal grandmother; Heart disease in her father and mother; High blood pressre (Hypertension) in her brother, father, mother, and sister; Stroke in her mother; Thyroid disease in her sister. Social History:  reports that she has quit smoking. She has never used smokeless tobacco. She reports that she does not drink alcohol or use drugs. OB/GYN History:  OB History    Gravida Para Term Preterm AB Living   5       5     SAB TAB Ectopic Molar Multiple Live Births   5                Allergies: is allergic to sulfa (sulfonamide antibiotics) and zinc. Medications:  Current Outpatient Prescriptions:  .  ACETAMINOPHEN (TYLENOL ARTHRITIS ORAL), Take by mouth., Disp: , Rfl:  .  amLODIPine-benazepril (LOTREL) 5-20 mg capsule, Take 1 capsule by mouth once daily., Disp: , Rfl:  .  aspirin 81 MG EC tablet, Take 81 mg by mouth once daily., Disp: , Rfl:  .  hydroCHLOROthiazide (HYDRODIURIL) 25 MG tablet, Take 25 mg by mouth once daily., Disp: , Rfl:  .  levothyroxine (SYNTHROID, LEVOTHROID) 88  MCG tablet, Take 88 mcg by mouth once daily. Take on an empty stomach with a glass of water at least 30-60 minutes before breakfast., Disp: , Rfl:  .  medroxyPROGESTERone (PROVERA) 10 MG tablet, Take by mouth., Disp: , Rfl:    Review of Systems: No SOB, no palpitations or chest pain, no new lower extremity edema, no nausea or vomiting or bowel or bladder complaints. See HPI for gyn specific ROS.   Exam:     BP 115/75   Pulse 75   Ht 154.9 cm (5\' 1" )   Wt 80 kg (176 lb 6.4 oz)   LMP 03/23/1973 (Approximate)   BMI 33.33 kg/m   General: Patient is well-groomed, well-nourished, appears stated age in no acute distress  HEENT: head is atraumatic and normocephalic, trachea is midline, neck is supple with no palpable nodules  CV: Regular rhythm and normal heart rate, no murmur  Pulm: Clear to auscultation throughout lung fields with no wheezing, crackles, or rhonchi. No increased work of breathing  Abdomen: soft , no mass, non-tender, no rebound tenderness, no hepatomegaly  Pelvic: deferred, done by Dr. Theora Gianotti on 11/28.  Impression:   The encounter diagnosis was Endometrial cancer (CMS-HCC).    Plan:   The patient and I discussed the technical aspects of the procedure including the potential for risks and complications.These include but are not limited to the risk of infection requiring post-operative antibiotics or further procedures.We talked about  the risk of injury to adjacent organs including bladder, bowel, ureter, blood vessels or nerves.We talked about the need to convert to an open incision.We talked about the possibleneed for blood transfusion.We talked aboutpostop complications such asthromboembolic or cardiopulmonary complications.All of her questions were answered. Her preoperative exam was completed andthe appropriate consents were signed. She is scheduled to undergo this procedure in the near future.  She will return on 12/6 for surgery: TLH BSO  sentinel node mapping/biopsy, possible pelvic/para=aortic lymph node dissection, possible open.  Consents signed today.    Return in about 2 weeks (around 03/09/2016) for operative follow up.  CHELSEA CRIST WARD, MD

## 2016-02-26 ENCOUNTER — Inpatient Hospital Stay: Payer: Medicare Other | Admitting: *Deleted

## 2016-02-26 ENCOUNTER — Ambulatory Visit
Admission: RE | Admit: 2016-02-26 | Discharge: 2016-02-26 | Disposition: A | Discharge status: Home / Self Care | Payer: Medicare Other | Source: Ambulatory Visit | Attending: Obstetrics & Gynecology | Admitting: Obstetrics & Gynecology

## 2016-02-26 ENCOUNTER — Other Ambulatory Visit: Payer: Self-pay | Admitting: *Deleted

## 2016-02-26 ENCOUNTER — Other Ambulatory Visit: Payer: Federal, State, Local not specified - PPO

## 2016-02-26 ENCOUNTER — Encounter
Admission: RE | Disposition: A | Discharge status: Home / Self Care | Payer: Self-pay | Source: Ambulatory Visit | Attending: Obstetrics & Gynecology

## 2016-02-26 DIAGNOSIS — N838 Other noninflammatory disorders of ovary, fallopian tube and broad ligament: Secondary | ICD-10-CM | POA: Insufficient documentation

## 2016-02-26 DIAGNOSIS — N72 Inflammatory disease of cervix uteri: Secondary | ICD-10-CM | POA: Diagnosis not present

## 2016-02-26 DIAGNOSIS — D649 Anemia, unspecified: Secondary | ICD-10-CM | POA: Insufficient documentation

## 2016-02-26 DIAGNOSIS — I10 Essential (primary) hypertension: Secondary | ICD-10-CM | POA: Insufficient documentation

## 2016-02-26 DIAGNOSIS — D259 Leiomyoma of uterus, unspecified: Secondary | ICD-10-CM | POA: Insufficient documentation

## 2016-02-26 DIAGNOSIS — C541 Malignant neoplasm of endometrium: Secondary | ICD-10-CM | POA: Diagnosis present

## 2016-02-26 DIAGNOSIS — C7982 Secondary malignant neoplasm of genital organs: Secondary | ICD-10-CM | POA: Insufficient documentation

## 2016-02-26 DIAGNOSIS — E039 Hypothyroidism, unspecified: Secondary | ICD-10-CM | POA: Insufficient documentation

## 2016-02-26 DIAGNOSIS — Z888 Allergy status to other drugs, medicaments and biological substances status: Secondary | ICD-10-CM | POA: Diagnosis not present

## 2016-02-26 DIAGNOSIS — N803 Endometriosis of pelvic peritoneum: Secondary | ICD-10-CM | POA: Insufficient documentation

## 2016-02-26 DIAGNOSIS — Z882 Allergy status to sulfonamides status: Secondary | ICD-10-CM | POA: Diagnosis not present

## 2016-02-26 DIAGNOSIS — E785 Hyperlipidemia, unspecified: Secondary | ICD-10-CM | POA: Insufficient documentation

## 2016-02-26 DIAGNOSIS — Z7982 Long term (current) use of aspirin: Secondary | ICD-10-CM | POA: Insufficient documentation

## 2016-02-26 DIAGNOSIS — Z87891 Personal history of nicotine dependence: Secondary | ICD-10-CM | POA: Insufficient documentation

## 2016-02-26 HISTORY — PX: SENTINEL NODE BIOPSY: SHX6608

## 2016-02-26 HISTORY — PX: LAPAROSCOPIC HYSTERECTOMY: SHX1926

## 2016-02-26 HISTORY — PX: LAPAROSCOPIC BILATERAL SALPINGO OOPHERECTOMY: SHX5890

## 2016-02-26 LAB — POCT I-STAT 4, (NA,K, GLUC, HGB,HCT)
GLUCOSE: 95 mg/dL (ref 65–99)
HEMATOCRIT: 38 % (ref 36.0–46.0)
HEMOGLOBIN: 12.9 g/dL (ref 12.0–15.0)
POTASSIUM: 3.3 mmol/L — AB (ref 3.5–5.1)
SODIUM: 140 mmol/L (ref 135–145)

## 2016-02-26 LAB — TYPE AND SCREEN
ABO/RH(D): AB POS
Antibody Screen: NEGATIVE

## 2016-02-26 SURGERY — HYSTERECTOMY, TOTAL, LAPAROSCOPIC
Anesthesia: General

## 2016-02-26 MED ORDER — FAMOTIDINE 20 MG PO TABS
20.0000 mg | ORAL_TABLET | Freq: Once | ORAL | Status: AC
  Administered 2016-02-26: 20 mg via ORAL

## 2016-02-26 MED ORDER — BUPIVACAINE HCL (PF) 0.5 % IJ SOLN
INTRAMUSCULAR | Status: AC
  Filled 2016-02-26: qty 30

## 2016-02-26 MED ORDER — FENTANYL CITRATE (PF) 100 MCG/2ML IJ SOLN
INTRAMUSCULAR | Status: DC | PRN
  Administered 2016-02-26: 100 ug via INTRAVENOUS
  Administered 2016-02-26 (×2): 50 ug via INTRAVENOUS
  Administered 2016-02-26: 100 ug via INTRAVENOUS

## 2016-02-26 MED ORDER — ACETAMINOPHEN 325 MG PO TABS: 650.0000 mg | ORAL_TABLET | ORAL | Status: DC | PRN

## 2016-02-26 MED ORDER — ACETAMINOPHEN 650 MG RE SUPP
650.0000 mg | RECTAL | Status: DC | PRN
  Filled 2016-02-26: qty 1

## 2016-02-26 MED ORDER — THROMBIN 5000 UNITS EX SOLR
CUTANEOUS | Status: DC | PRN
  Administered 2016-02-26: 5000 [IU] via TOPICAL

## 2016-02-26 MED ORDER — HEPARIN SODIUM (PORCINE) 5000 UNIT/ML IJ SOLN
5000.0000 [IU] | Freq: Once | INTRAMUSCULAR | Status: AC
  Administered 2016-02-26: 5000 [IU] via SUBCUTANEOUS

## 2016-02-26 MED ORDER — IBUPROFEN 600 MG PO TABS: 600.0000 mg | ORAL_TABLET | Freq: Four times a day (QID) | ORAL | 1 refills | Status: DC

## 2016-02-26 MED ORDER — HEPARIN SODIUM (PORCINE) 5000 UNIT/ML IJ SOLN
INTRAMUSCULAR | Status: AC
  Administered 2016-02-26: 5000 [IU] via SUBCUTANEOUS
  Filled 2016-02-26: qty 1

## 2016-02-26 MED ORDER — GABAPENTIN 300 MG PO CAPS
300.0000 mg | ORAL_CAPSULE | Freq: Once | ORAL | Status: AC
  Administered 2016-02-26: 300 mg via ORAL

## 2016-02-26 MED ORDER — LACTATED RINGERS IV SOLN: INTRAVENOUS | Status: DC | PRN

## 2016-02-26 MED ORDER — FENTANYL CITRATE (PF) 100 MCG/2ML IJ SOLN
25.0000 ug | INTRAMUSCULAR | Status: DC | PRN
  Administered 2016-02-26 (×4): 25 ug via INTRAVENOUS

## 2016-02-26 MED ORDER — THROMBIN 5000 UNITS EX SOLR
CUTANEOUS | Status: AC
  Filled 2016-02-26: qty 5000

## 2016-02-26 MED ORDER — INDOCYANINE GREEN 25 MG IV SOLR
INTRAVENOUS | Status: AC
  Filled 2016-02-26: qty 25

## 2016-02-26 MED ORDER — GLYCOPYRROLATE 0.2 MG/ML IJ SOLN
INTRAMUSCULAR | Status: DC | PRN
  Administered 2016-02-26: 0.2 mg via INTRAVENOUS

## 2016-02-26 MED ORDER — CEFAZOLIN SODIUM-DEXTROSE 2-4 GM/100ML-% IV SOLN
2.0000 g | Freq: Once | INTRAVENOUS | Status: AC
  Administered 2016-02-26: 2 g via INTRAVENOUS

## 2016-02-26 MED ORDER — INDOCYANINE GREEN 25 MG IV SOLR
INTRAVENOUS | Status: DC | PRN
  Administered 2016-02-26: 20 mg

## 2016-02-26 MED ORDER — ROCURONIUM BROMIDE 100 MG/10ML IV SOLN
INTRAVENOUS | Status: DC | PRN
  Administered 2016-02-26: 5 mg via INTRAVENOUS
  Administered 2016-02-26: 20 mg via INTRAVENOUS
  Administered 2016-02-26: 5 mg via INTRAVENOUS

## 2016-02-26 MED ORDER — CEFAZOLIN SODIUM-DEXTROSE 2-4 GM/100ML-% IV SOLN
INTRAVENOUS | Status: AC
  Administered 2016-02-26: 2 g via INTRAVENOUS
  Filled 2016-02-26: qty 100

## 2016-02-26 MED ORDER — GABAPENTIN 300 MG PO CAPS
ORAL_CAPSULE | ORAL | Status: AC
  Administered 2016-02-26: 300 mg via ORAL
  Filled 2016-02-26: qty 1

## 2016-02-26 MED ORDER — LABETALOL HCL 5 MG/ML IV SOLN
INTRAVENOUS | Status: DC | PRN
  Administered 2016-02-26 (×3): 5 mg via INTRAVENOUS

## 2016-02-26 MED ORDER — KETOROLAC TROMETHAMINE 30 MG/ML IJ SOLN: 30.0000 mg | Freq: Four times a day (QID) | INTRAMUSCULAR | Status: DC

## 2016-02-26 MED ORDER — MORPHINE SULFATE (PF) 4 MG/ML IV SOLN: 1.0000 mg | INTRAVENOUS | Status: DC | PRN

## 2016-02-26 MED ORDER — ONDANSETRON HCL 4 MG/2ML IJ SOLN
INTRAMUSCULAR | Status: AC
  Administered 2016-02-26: 4 mg via INTRAVENOUS
  Filled 2016-02-26: qty 2

## 2016-02-26 MED ORDER — MIDAZOLAM HCL 2 MG/2ML IJ SOLN
INTRAMUSCULAR | Status: DC | PRN
  Administered 2016-02-26: 1 mg via INTRAVENOUS

## 2016-02-26 MED ORDER — BUPIVACAINE HCL 0.5 % IJ SOLN
INTRAMUSCULAR | Status: DC | PRN
  Administered 2016-02-26: 10 mL

## 2016-02-26 MED ORDER — SUGAMMADEX SODIUM 200 MG/2ML IV SOLN
INTRAVENOUS | Status: DC | PRN
  Administered 2016-02-26: 159.6 mg via INTRAVENOUS

## 2016-02-26 MED ORDER — OXYCODONE HCL 5 MG PO TABS: 5.0000 mg | ORAL_TABLET | ORAL | 0 refills | Status: DC | PRN

## 2016-02-26 MED ORDER — PROPOFOL 10 MG/ML IV BOLUS
INTRAVENOUS | Status: DC | PRN
  Administered 2016-02-26: 125 mg via INTRAVENOUS

## 2016-02-26 MED ORDER — ONDANSETRON HCL 4 MG/2ML IJ SOLN
4.0000 mg | Freq: Once | INTRAMUSCULAR | Status: AC | PRN
  Administered 2016-02-26: 4 mg via INTRAVENOUS

## 2016-02-26 MED ORDER — LACTATED RINGERS IV SOLN
INTRAVENOUS | Status: DC
  Administered 2016-02-26 (×2): via INTRAVENOUS

## 2016-02-26 MED ORDER — FENTANYL CITRATE (PF) 100 MCG/2ML IJ SOLN
INTRAMUSCULAR | Status: AC
  Administered 2016-02-26: 25 ug via INTRAVENOUS
  Filled 2016-02-26: qty 2

## 2016-02-26 SURGICAL SUPPLY — 77 items
APPLICATOR SURGIFLO ENDO (HEMOSTASIS) ×4 IMPLANT
BACTOSHIELD CHG 4% 4OZ (MISCELLANEOUS)
BAG URO DRAIN 2000ML W/SPOUT (MISCELLANEOUS) ×4 IMPLANT
BLADE SURG SZ11 CARB STEEL (BLADE) ×4 IMPLANT
CANISTER SUCT 1200ML W/VALVE (MISCELLANEOUS) ×4 IMPLANT
CANNULA DILATOR  5MM W/SLV (CANNULA) ×2
CANNULA DILATOR 5 W/SLV (CANNULA) ×6 IMPLANT
CATH FOLEY 2WAY  5CC 16FR (CATHETERS) ×2
CATH ROBINSON RED A/P 16FR (CATHETERS) ×4 IMPLANT
CATH URTH 16FR FL 2W BLN LF (CATHETERS) ×2 IMPLANT
CHLORAPREP W/TINT 26ML (MISCELLANEOUS) ×8 IMPLANT
DEFOGGER SCOPE WARMER CLEARIFY (MISCELLANEOUS) ×8 IMPLANT
DEVICE SUTURE ENDOST 10MM (ENDOMECHANICALS) IMPLANT
DRAPE LEGGINS SURG 28X43 STRL (DRAPES) ×4 IMPLANT
DRAPE SHEET LG 3/4 BI-LAMINATE (DRAPES) ×4 IMPLANT
DRAPE UNDER BUTTOCK W/FLU (DRAPES) ×4 IMPLANT
DRESSING TELFA 4X3 1S ST N-ADH (GAUZE/BANDAGES/DRESSINGS) IMPLANT
DRSG TEGADERM 2-3/8X2-3/4 SM (GAUZE/BANDAGES/DRESSINGS) IMPLANT
ENDOPOUCH RETRIEVER 10 (MISCELLANEOUS) IMPLANT
GAUZE SPONGE NON-WVN 2X2 STRL (MISCELLANEOUS) IMPLANT
GLOVE BIOGEL PI IND STRL 6.5 (GLOVE) ×8 IMPLANT
GLOVE BIOGEL PI INDICATOR 6.5 (GLOVE) ×8
GLOVE PI ORTHOPRO 6.5 (GLOVE) ×2
GLOVE PI ORTHOPRO STRL 6.5 (GLOVE) ×2 IMPLANT
GLOVE SURG SYN 6.5 ES PF (GLOVE) ×24 IMPLANT
GOWN STRL REUS W/ TWL LRG LVL3 (GOWN DISPOSABLE) ×6 IMPLANT
GOWN STRL REUS W/ TWL XL LVL3 (GOWN DISPOSABLE) ×2 IMPLANT
GOWN STRL REUS W/TWL LRG LVL3 (GOWN DISPOSABLE) ×6
GOWN STRL REUS W/TWL XL LVL3 (GOWN DISPOSABLE) ×2
GRASPER SUT TROCAR 14GX15 (MISCELLANEOUS) ×4 IMPLANT
IRRIGATION STRYKERFLOW (MISCELLANEOUS) ×2 IMPLANT
IRRIGATOR STRYKERFLOW (MISCELLANEOUS) ×4
IV LACTATED RINGERS 1000ML (IV SOLUTION) ×4 IMPLANT
KIT PINK PAD W/HEAD ARE REST (MISCELLANEOUS) ×4
KIT PINK PAD W/HEAD ARM REST (MISCELLANEOUS) ×2 IMPLANT
KIT RM TURNOVER CYSTO AR (KITS) ×4 IMPLANT
L-HOOK LAP DISP 36CM (ELECTROSURGICAL)
LABEL OR SOLS (LABEL) ×4 IMPLANT
LHOOK LAP DISP 36CM (ELECTROSURGICAL) IMPLANT
LIGASURE BLUNT 5MM 37CM (INSTRUMENTS) ×4 IMPLANT
LIGASURE MARYLAND LAP STAND (ELECTROSURGICAL) IMPLANT
LIQUID BAND (GAUZE/BANDAGES/DRESSINGS) ×4 IMPLANT
MANIPULATOR UTERINE 4.5 ZUMI (MISCELLANEOUS) IMPLANT
MANIPULATOR VCARE LG CRV RETR (MISCELLANEOUS) IMPLANT
MANIPULATOR VCARE SML CRV RETR (MISCELLANEOUS) ×4 IMPLANT
MANIPULATOR VCARE STD CRV RETR (MISCELLANEOUS) IMPLANT
NEEDLE VERESS 14GA 120MM (NEEDLE) ×4 IMPLANT
NS IRRIG 500ML POUR BTL (IV SOLUTION) ×4 IMPLANT
PACK LAP CHOLECYSTECTOMY (MISCELLANEOUS) ×4 IMPLANT
PAD OB MATERNITY 4.3X12.25 (PERSONAL CARE ITEMS) ×4 IMPLANT
PAD PREP 24X41 OB/GYN DISP (PERSONAL CARE ITEMS) ×4 IMPLANT
PENCIL ELECTRO HAND CTR (MISCELLANEOUS) ×4 IMPLANT
SCISSORS METZENBAUM CVD 33 (INSTRUMENTS) IMPLANT
SCRUB CHG 4% DYNA-HEX 4OZ (MISCELLANEOUS) IMPLANT
SET CYSTO W/LG BORE CLAMP LF (SET/KITS/TRAYS/PACK) IMPLANT
SHEARS ENDO 5MM 31CM (CUTTER) ×4 IMPLANT
SHEARS HARMONIC ACE PLUS 36CM (ENDOMECHANICALS) IMPLANT
SLEEVE ENDOPATH XCEL 5M (ENDOMECHANICALS) IMPLANT
SPONGE VERSALON 2X2 STRL (MISCELLANEOUS)
SPONGE XRAY 4X4 16PLY STRL (MISCELLANEOUS) ×4 IMPLANT
SURGIFLO W/THROMBIN 8M KIT (HEMOSTASIS) ×4 IMPLANT
SURGILUBE 2OZ TUBE FLIPTOP (MISCELLANEOUS) ×4 IMPLANT
SUT ENDO VLOC 180-0-8IN (SUTURE) ×4 IMPLANT
SUT MNCRL 4-0 (SUTURE) ×2
SUT MNCRL 4-0 27XMFL (SUTURE) ×2
SUT MNCRL AB 4-0 PS2 18 (SUTURE) ×4 IMPLANT
SUT VIC AB 0 CT1 36 (SUTURE) ×8 IMPLANT
SUT VIC AB 2-0 UR6 27 (SUTURE) ×8 IMPLANT
SUT VIC AB 4-0 PS2 18 (SUTURE) IMPLANT
SUTURE MNCRL 4-0 27XMF (SUTURE) ×2 IMPLANT
SYRINGE 10CC LL (SYRINGE) ×4 IMPLANT
TROCAR BLUNT TIP 12MM OMST12BT (TROCAR) IMPLANT
TROCAR ENDO BLADELESS 11MM (ENDOMECHANICALS) IMPLANT
TROCAR VERSASTEP PLUS 12MM (TROCAR) ×4 IMPLANT
TROCAR XCEL NON-BLD 5MMX100MML (ENDOMECHANICALS) IMPLANT
TUBING INSUFFLATOR HEATED (MISCELLANEOUS) ×4 IMPLANT
TUBING INSUFFLATOR HI FLOW (MISCELLANEOUS) ×4 IMPLANT

## 2016-02-26 NOTE — Anesthesia Preprocedure Evaluation (Signed)
Anesthesia Evaluation  Patient identified by MRN, date of birth, ID band Patient awake    Reviewed: Allergy & Precautions, NPO status , Patient's Chart, lab work & pertinent test results  History of Anesthesia Complications Negative for: history of anesthetic complications  Airway Mallampati: III       Dental   Pulmonary neg pulmonary ROS, former smoker,           Cardiovascular hypertension, Pt. on medications (-) Past MI and (-) CHF (-) dysrhythmias (-) Valvular Problems/Murmurs     Neuro/Psych negative neurological ROS     GI/Hepatic negative GI ROS, Neg liver ROS,   Endo/Other  Hypothyroidism   Renal/GU negative Renal ROS     Musculoskeletal   Abdominal   Peds  Hematology  (+) anemia ,   Anesthesia Other Findings   Reproductive/Obstetrics                            Anesthesia Physical Anesthesia Plan  ASA: II  Anesthesia Plan: General   Post-op Pain Management:    Induction: Intravenous  Airway Management Planned: Oral ETT  Additional Equipment:   Intra-op Plan:   Post-operative Plan:   Informed Consent: I have reviewed the patients History and Physical, chart, labs and discussed the procedure including the risks, benefits and alternatives for the proposed anesthesia with the patient or authorized representative who has indicated his/her understanding and acceptance.     Plan Discussed with:   Anesthesia Plan Comments:         Anesthesia Quick Evaluation

## 2016-02-26 NOTE — Anesthesia Postprocedure Evaluation (Signed)
Anesthesia Post Note  Patient: Vickie Barton  Procedure(s) Performed: Procedure(s) (LRB): HYSTERECTOMY TOTAL LAPAROSCOPIC (N/A) LAPAROSCOPIC BILATERAL SALPINGO OOPHORECTOMY (Bilateral) SENTINEL NODE BIOPSY (N/A)  Patient location during evaluation: PACU Anesthesia Type: General Level of consciousness: awake and alert Pain management: pain level controlled Vital Signs Assessment: post-procedure vital signs reviewed and stable Respiratory status: spontaneous breathing and respiratory function stable Cardiovascular status: stable Anesthetic complications: no    Last Vitals:  Vitals:   02/26/16 1410 02/26/16 1415  BP: (!) 142/67 (!) 142/67  Pulse: 63 65  Resp: 20 (!) 22  Temp:      Last Pain:  Vitals:   02/26/16 1415  TempSrc:   PainSc: 4                  KEPHART,WILLIAM K

## 2016-02-26 NOTE — Anesthesia Procedure Notes (Signed)
Procedure Name: Intubation Date/Time: 02/26/2016 11:15 AM Performed by: Jennette Bill Pre-anesthesia Checklist: Patient identified, Emergency Drugs available, Suction available, Timeout performed and Patient being monitored Patient Re-evaluated:Patient Re-evaluated prior to inductionOxygen Delivery Method: Circle system utilized Preoxygenation: Pre-oxygenation with 100% oxygen Intubation Type: IV induction Ventilation: Mask ventilation without difficulty Laryngoscope Size: Mac and 3 Grade View: Grade III Tube type: Oral Tube size: 7.0 mm Number of attempts: 1 Airway Equipment and Method: Stylet Placement Confirmation: ETT inserted through vocal cords under direct vision,  positive ETCO2 and breath sounds checked- equal and bilateral Secured at: 21 cm Tube secured with: Tape Dental Injury: Teeth and Oropharynx as per pre-operative assessment

## 2016-02-26 NOTE — OR Nursing (Signed)
Voided 100 ml in bedside commode. Pink in color.

## 2016-02-26 NOTE — Transfer of Care (Signed)
Immediate Anesthesia Transfer of Care Note  Patient: Vickie Barton  Procedure(s) Performed: Procedure(s): HYSTERECTOMY TOTAL LAPAROSCOPIC (N/A) LAPAROSCOPIC BILATERAL SALPINGO OOPHORECTOMY (Bilateral) SENTINEL NODE BIOPSY (N/A)  Patient Location: PACU  Anesthesia Type:General  Level of Consciousness: awake, alert  and oriented  Airway & Oxygen Therapy: Patient Spontanous Breathing and Patient connected to face mask oxygen  Post-op Assessment: Report given to RN and Post -op Vital signs reviewed and stable  Post vital signs: Reviewed and stable  Last Vitals:  Vitals:   02/26/16 1052 02/26/16 1355  BP:  (!) 164/71  Pulse:  66  Resp:  (!) 21  Temp: 37.1 C 36.9 C    Last Pain:  Vitals:   02/26/16 1355  TempSrc:   PainSc: Asleep         Complications: No apparent anesthesia complications

## 2016-02-26 NOTE — Op Note (Addendum)
Operative Note   02/26/2016 2:43 PM  PRE-OP DIAGNOSIS: High grade serous endometrial cancer    POST-OP DIAGNOSIS: High grade serous endometrial cancer   SURGEON:  Primary - Mellody Drown, MD  ASSISTANT:  [name], MD  ANESTHESIA: General ET  PROCEDURE: HYSTERECTOMY TOTAL LAPAROSCOPIC, LAPAROSCOPIC BILATERAL SALPINGO OOPHORECTOMY, PELVIC SENTINEL LYMPH NODE MAPPING AND BIOPSY   ESTIMATED BLOOD LOSS: less than 100 mL  DRAINS: NONE   TOTAL IV FLUIDS: per Anesthesia  SPECIMENS: Uterus, fallopian tubes, ovaries and left external iliac sentinel lymph node.   COMPLICATIONS: none  DISPOSITION: PACU - hemodynamically stable.  CONDITION: stable  INDICATIONS: Endometrial biopsy showed high grade serous adenocarcinoma.  Some concern for ovarian enlargement on CT scan, but no carcinomatosis.  FINDINGS: Normal omentum and upper abdomen.  Uterus slightly enlarged and fixed posteriorly due to scarring consistent with endometriosis.  Adnexa were adherent posteriorly, but no masses.  SLN mapping revealed green node on left side below the external iliac artery.  No channels or nodes mapped on the right.   Frozen section not done because no further surgery would be performed based on results.  The iliac vessels were very tortuous and the endoshears touched the left external iliac with a small burn.  Dr. Lucky Cowboy from vascular surgery came and looked at the area and had not concern about this superficial burn.   PROCEDURE IN DETAIL: After informed consent was obtained, the patient was taken to the operating room where anesthesia was obtained without difficulty. The patient was positioned in the dorsal lithotomy position in [place] and her arms were carefully tucked at her sides and the usual precautions were taken.  She was prepped and draped in normal sterile fashion.  Time-out was performed and a Foley catheter was placed into the bladder and the cervix was infiltrated with 4 ml of ICG dye  at 3 an 9 o'clock both superficial and deep injections. A small VCare uterine manipulator was then placed in the uterus without incident.    An open Hasson technique was used to place an infraumbilical [id number] baloon trocar under direct visualization. The laparoscope was introduced and CO2 gas was infused for pneumoperitoneum to a pressure of 15 mm Hg.  Right and left lateral 5-mm ports and a 5-12 mm suprapubic port were placed under direct visualization of the laparoscope using an EndoStep technique.  Cytologic washings were obtained.  The patient was placed in Trendelenburg and the bowel was displaced up into the upper abdomen.  Round ligaments were divided on each side with the EndoShears and the retroperitoneal space was opened bilaterally.  The ureters were identified and preserved.  At this point the retroperitoneal spaces were developed and the lymphatic channels were mapped to each side.  The sentinel node on the left side was then identified, skeletonized and removed taking care not to injure the  obturator nerve, the ureter or the pelvic vasculature.  No nodes were mapped ton the right sdie. With hemostasis secured, the infundibulopelvic ligaments were skeletonized, sealed and divided with the LigaSure device taking care to avoid the ureters.  A bladder flap was created and the bladder was dissected down off the lower uterine segment and cervix using endoshears and electrocautery.  The uterine arteries were skeletonized bilaterally, sealed and divided with the LigaSure device.  The rectosigmoid was adherent to the back of the uterus and this was taken down sharply.  A colpotomy was performed circumferentially along the V-Care ring with electrocautery and the cervix was incised from  the vagina and the specimen was removed through the vagina.  A pneumo balloon was placed in the vagina and the vaginal cuff was then closed in a running continuous fashion using the EndoStitch technique with 0 V-Lock suture with  careful attention to include the vaginal cuff angles and the vaginal mucosa within the closure.  There was some bleeding from the area where the rectosigmoid had been dissected off the back of the uterus.  A bleeding vessel in the mesentery was controlled with the ligasure and 10 ml of floseal was placed in the pelvis. Hemostasis was observed. The intraperitoneal pressure was dropped, and all planes of dissection, vascular pedicles and the vaginal cuff were found to be hemostatic.    The suprapubic trocar was removed and the fascia was closed with 0 Vicryl suture using the Endoclose technique. The lateral trocars were removed under visualization.   Before the umbilical trocar was removed the CO2 gas was released.  The fascia there was closed with 0 Vicryl suture in interrupted technique.   The skin incisions were closed with subcuticular stitches and glue.  The patient tolerated the procedure well.  Sponge, lap and needle counts were correct.  The patient was taken to recovery room in excellent condition.  Antibiotics: Ancef given within one hour of skin incision.  VTE prophylaxis: was ordered perioperatively.  Mellody Drown, MD

## 2016-02-26 NOTE — Interval H&P Note (Signed)
History and Physical Interval Note:  02/26/2016 10:21 AM  Vickie Barton  has presented today for surgery, with the diagnosis of Endometrial Cancer  The various methods of treatment have been discussed with the patient and family. After consideration of risks, benefits and other options for treatment, the patient has consented to  Procedure(s): HYSTERECTOMY TOTAL LAPAROSCOPIC (N/A) LAPAROSCOPIC BILATERAL SALPINGO OOPHORECTOMY (Bilateral) SENTINEL NODE BIOPSY (N/A) as a surgical intervention .  The patient's history has been reviewed, patient examined, no change in status, stable for surgery.  I have reviewed the patient's chart and labs.  Questions were answered to the patient's satisfaction.     [place]

## 2016-02-26 NOTE — Discharge Instructions (Signed)
Discharge instructions:  °Call office if you have any of the following: fever >101 F, chills, excessive vaginal bleeding, incision drainage or problems, leg pain or redness, or any other concerns.  ° °Activity: Do not lift > 10 lbs for 8 weeks.  °No intercourse or tampons for 8 weeks.  °No driving for 1-2 weeks.  ° °Please don't limit yourself in terms of routine activity.  You will be able to do most things, although they may take longer to do or be a little painful.  You can do it! ° °Don't be a hero, but don't be a wimp either!  ° °

## 2016-02-26 NOTE — OR Nursing (Signed)
Verbal order to dc celebrex because of sulfa allergy

## 2016-02-26 NOTE — Anesthesia Procedure Notes (Signed)
Date/Time: 02/26/2016 11:45 AM Performed by: Jennette Bill

## 2016-02-28 ENCOUNTER — Encounter: Payer: Self-pay | Admitting: Obstetrics & Gynecology

## 2016-02-28 ENCOUNTER — Telehealth: Payer: Self-pay | Admitting: Obstetrics & Gynecology

## 2016-02-28 LAB — CYTOLOGY - NON PAP

## 2016-02-28 NOTE — Telephone Encounter (Signed)
Called patient, she is doing well, eating lunch, moving around.  Feels sore but in great spirits.  Pathology not back yet.  [place]

## 2016-04-01 ENCOUNTER — Ambulatory Visit: Payer: Federal, State, Local not specified - PPO

## 2016-04-13 ENCOUNTER — Other Ambulatory Visit: Payer: Self-pay

## 2016-04-15 ENCOUNTER — Telehealth: Payer: Self-pay

## 2016-04-15 NOTE — Telephone Encounter (Signed)
  Oncology Nurse Navigator Documentation Vickie Barton called and spoke with Vickie Barton. He reiterated her pathology and the need for further treatment with chemotherapy and/or vaginal brachytherapy. At this time she continues to refuse further treatment. She will continue to follow with Vickie Barton and see Gyn Onc as needed. Navigator Location: [facility] (04/15/16 1600)   )Navigator Encounter Type: Telephone;Diagnostic Results (04/15/16 1600)                                                    Time Spent with Patient: 15 (04/15/16 1600)

## 2016-07-13 DIAGNOSIS — M5417 Radiculopathy, lumbosacral region: Secondary | ICD-10-CM | POA: Diagnosis not present

## 2016-07-13 DIAGNOSIS — M79605 Pain in left leg: Secondary | ICD-10-CM | POA: Diagnosis not present

## 2016-07-13 DIAGNOSIS — M7062 Trochanteric bursitis, left hip: Secondary | ICD-10-CM | POA: Diagnosis not present

## 2016-07-21 ENCOUNTER — Other Ambulatory Visit: Payer: Self-pay | Admitting: Student

## 2016-07-21 DIAGNOSIS — M5442 Lumbago with sciatica, left side: Secondary | ICD-10-CM

## 2016-07-24 ENCOUNTER — Ambulatory Visit
Admission: RE | Admit: 2016-07-24 | Discharge: 2016-07-24 | Disposition: A | Discharge status: Home / Self Care | Payer: Medicare Other | Source: Ambulatory Visit | Attending: Student | Admitting: Student

## 2016-07-24 ENCOUNTER — Encounter: Payer: Self-pay | Admitting: Radiology

## 2016-07-24 DIAGNOSIS — M5442 Lumbago with sciatica, left side: Secondary | ICD-10-CM

## 2016-07-24 DIAGNOSIS — M5416 Radiculopathy, lumbar region: Secondary | ICD-10-CM | POA: Insufficient documentation

## 2016-07-24 DIAGNOSIS — M5126 Other intervertebral disc displacement, lumbar region: Secondary | ICD-10-CM | POA: Diagnosis not present

## 2016-07-24 DIAGNOSIS — Z8542 Personal history of malignant neoplasm of other parts of uterus: Secondary | ICD-10-CM | POA: Diagnosis not present

## 2016-07-24 DIAGNOSIS — R590 Localized enlarged lymph nodes: Secondary | ICD-10-CM | POA: Insufficient documentation

## 2016-07-24 DIAGNOSIS — M47896 Other spondylosis, lumbar region: Secondary | ICD-10-CM | POA: Diagnosis not present

## 2016-07-27 ENCOUNTER — Other Ambulatory Visit: Payer: Self-pay | Admitting: Student

## 2016-07-27 DIAGNOSIS — M5416 Radiculopathy, lumbar region: Secondary | ICD-10-CM

## 2016-07-30 ENCOUNTER — Ambulatory Visit
Admission: RE | Admit: 2016-07-30 | Discharge: 2016-07-30 | Disposition: A | Discharge status: Home / Self Care | Payer: Medicare Other | Source: Ambulatory Visit | Attending: Student | Admitting: Student

## 2016-07-30 DIAGNOSIS — M5416 Radiculopathy, lumbar region: Secondary | ICD-10-CM

## 2016-07-30 DIAGNOSIS — M545 Low back pain: Secondary | ICD-10-CM | POA: Diagnosis not present

## 2016-07-30 MED ORDER — METHYLPREDNISOLONE ACETATE 40 MG/ML INJ SUSP (RADIOLOG
120.0000 mg | Freq: Once | INTRAMUSCULAR | Status: AC
  Administered 2016-07-30: 120 mg via EPIDURAL

## 2016-07-30 MED ORDER — IOPAMIDOL (ISOVUE-M 200) INJECTION 41%
1.0000 mL | Freq: Once | INTRAMUSCULAR | Status: AC
  Administered 2016-07-30: 1 mL via EPIDURAL

## 2016-07-30 NOTE — Discharge Instructions (Signed)

## 2016-10-06 DIAGNOSIS — E785 Hyperlipidemia, unspecified: Secondary | ICD-10-CM | POA: Diagnosis not present

## 2016-10-06 DIAGNOSIS — E039 Hypothyroidism, unspecified: Secondary | ICD-10-CM | POA: Diagnosis not present

## 2016-10-06 DIAGNOSIS — I10 Essential (primary) hypertension: Secondary | ICD-10-CM | POA: Diagnosis not present

## 2016-10-06 DIAGNOSIS — E876 Hypokalemia: Secondary | ICD-10-CM | POA: Diagnosis not present

## 2016-10-24 ENCOUNTER — Emergency Department: Payer: Medicare Other

## 2016-10-24 ENCOUNTER — Encounter: Payer: Self-pay | Admitting: Emergency Medicine

## 2016-10-24 ENCOUNTER — Inpatient Hospital Stay
Admission: EM | Admit: 2016-10-24 | Discharge: 2016-10-26 | DRG: 193 | Disposition: A | Discharge status: Home / Self Care | Payer: Medicare Other | Attending: Internal Medicine | Admitting: Internal Medicine

## 2016-10-24 DIAGNOSIS — Z87891 Personal history of nicotine dependence: Secondary | ICD-10-CM | POA: Diagnosis not present

## 2016-10-24 DIAGNOSIS — R4182 Altered mental status, unspecified: Secondary | ICD-10-CM | POA: Diagnosis not present

## 2016-10-24 DIAGNOSIS — Z882 Allergy status to sulfonamides status: Secondary | ICD-10-CM | POA: Diagnosis not present

## 2016-10-24 DIAGNOSIS — G9341 Metabolic encephalopathy: Secondary | ICD-10-CM | POA: Diagnosis present

## 2016-10-24 DIAGNOSIS — Z7982 Long term (current) use of aspirin: Secondary | ICD-10-CM

## 2016-10-24 DIAGNOSIS — Z79899 Other long term (current) drug therapy: Secondary | ICD-10-CM

## 2016-10-24 DIAGNOSIS — E785 Hyperlipidemia, unspecified: Secondary | ICD-10-CM | POA: Diagnosis present

## 2016-10-24 DIAGNOSIS — E039 Hypothyroidism, unspecified: Secondary | ICD-10-CM | POA: Diagnosis present

## 2016-10-24 DIAGNOSIS — Z8542 Personal history of malignant neoplasm of other parts of uterus: Secondary | ICD-10-CM

## 2016-10-24 DIAGNOSIS — I10 Essential (primary) hypertension: Secondary | ICD-10-CM | POA: Diagnosis present

## 2016-10-24 DIAGNOSIS — R410 Disorientation, unspecified: Secondary | ICD-10-CM | POA: Diagnosis not present

## 2016-10-24 DIAGNOSIS — J189 Pneumonia, unspecified organism: Principal | ICD-10-CM | POA: Diagnosis present

## 2016-10-24 DIAGNOSIS — G9340 Encephalopathy, unspecified: Secondary | ICD-10-CM | POA: Diagnosis not present

## 2016-10-24 DIAGNOSIS — M1990 Unspecified osteoarthritis, unspecified site: Secondary | ICD-10-CM | POA: Diagnosis present

## 2016-10-24 DIAGNOSIS — Z91048 Other nonmedicinal substance allergy status: Secondary | ICD-10-CM | POA: Diagnosis not present

## 2016-10-24 LAB — COMPREHENSIVE METABOLIC PANEL
ALK PHOS: 71 U/L (ref 38–126)
ALT: 10 U/L — ABNORMAL LOW (ref 14–54)
ANION GAP: 9 (ref 5–15)
AST: 19 U/L (ref 15–41)
Albumin: 3.5 g/dL (ref 3.5–5.0)
BUN: 15 mg/dL (ref 6–20)
CALCIUM: 9 mg/dL (ref 8.9–10.3)
CO2: 27 mmol/L (ref 22–32)
Chloride: 99 mmol/L — ABNORMAL LOW (ref 101–111)
Creatinine, Ser: 0.74 mg/dL (ref 0.44–1.00)
GFR calc non Af Amer: >60 mL/min (ref >60)
Glucose, Bld: 114 mg/dL — ABNORMAL HIGH (ref 65–99)
Potassium: 3.5 mmol/L (ref 3.5–5.1)
Sodium: 135 mmol/L (ref 135–145)
TOTAL PROTEIN: 7.8 g/dL (ref 6.5–8.1)
Total Bilirubin: 0.7 mg/dL (ref 0.3–1.2)

## 2016-10-24 LAB — TSH: TSH: 13.19 u[IU]/mL — AB (ref 0.350–4.500)

## 2016-10-24 LAB — CBC
HCT: 42.2 % (ref 35.0–47.0)
HEMOGLOBIN: 14.5 g/dL (ref 12.0–16.0)
MCH: 30.1 pg (ref 26.0–34.0)
MCHC: 34.3 g/dL (ref 32.0–36.0)
MCV: 87.8 fL (ref 80.0–100.0)
Platelets: 327 10*3/uL (ref 150–440)
RBC: 4.8 MIL/uL (ref 3.80–5.20)
RDW: 14.6 % — ABNORMAL HIGH (ref 11.5–14.5)
WBC: 7.1 10*3/uL (ref 3.6–11.0)

## 2016-10-24 LAB — URINALYSIS, COMPLETE (UACMP) WITH MICROSCOPIC
BILIRUBIN URINE: NEGATIVE
Glucose, UA: NEGATIVE mg/dL
HGB URINE DIPSTICK: NEGATIVE
Ketones, ur: NEGATIVE mg/dL
LEUKOCYTES UA: NEGATIVE
NITRITE: NEGATIVE
Protein, ur: >=300 mg/dL — AB
SPECIFIC GRAVITY, URINE: 1.03 (ref 1.005–1.030)
pH: 5 (ref 5.0–8.0)

## 2016-10-24 LAB — TROPONIN I

## 2016-10-24 MED ORDER — DEXTROSE 5 % IV SOLN
1.0000 g | Freq: Once | INTRAVENOUS | Status: AC
  Administered 2016-10-24: 1 g via INTRAVENOUS
  Filled 2016-10-24: qty 10

## 2016-10-24 MED ORDER — DEXTROSE 5 % IV SOLN
500.0000 mg | INTRAVENOUS | Status: DC
  Administered 2016-10-25: 22:00:00 500 mg via INTRAVENOUS
  Filled 2016-10-24 (×2): qty 500

## 2016-10-24 MED ORDER — DEXTROSE 5 % IV SOLN
500.0000 mg | Freq: Once | INTRAVENOUS | Status: AC
  Administered 2016-10-25: 500 mg via INTRAVENOUS
  Filled 2016-10-24: qty 500

## 2016-10-24 NOTE — ED Triage Notes (Signed)
Patient arrived POV with family co that patient "just is not right"  Her last known well was last week, and at that time EMS was called but she declines assistance.  Patient appears confused at times and has times where she appears to be searching for the right words to say.  Pt is pleasant and cooperative and does not like the BP cuff squeezing her arm.  VS are WNL.

## 2016-10-24 NOTE — ED Provider Notes (Signed)
[facility] Emergency Department Provider Note  ____________________________________________   First MD Initiated Contact with Patient 10/24/16 2156     (approximate)  I have reviewed the triage vital signs and the nursing notes.   HISTORY  Chief Complaint Altered Mental Status   HPI Vickie Barton is a 83 y.o. female with a history of endometrial cancer as well as hypertension who is presenting to the emergency department today with altered mental status. She presents with her family who say that she has had worsening confusion over the past week. The patient is denying any pain. The family says that she began acting confused after a trip to [place]. No history of trauma. No focal weakness.   Past Medical History:  Diagnosis Date  . Arthritis   . Endometrial cancer ([place]) 01/28/2016  . Hyperlipidemia   . Hypertension   . Hypothyroidism     Patient Active Problem List   Diagnosis Date Noted  . Endometrial cancer ([place]) 02/19/2016    Past Surgical History:  Procedure Laterality Date  . DILATION AND CURETTAGE OF UTERUS    . HYSTEROSCOPY W/D&C N/A 01/28/2016   Procedure: DILATATION AND CURETTAGE /HYSTEROSCOPY;  Surgeon: [name], MD;  Location: [facility] ORS;  Service: Gynecology;  Laterality: N/A;  . LAPAROSCOPIC BILATERAL SALPINGO OOPHERECTOMY Bilateral 02/26/2016   Procedure: LAPAROSCOPIC BILATERAL SALPINGO OOPHORECTOMY;  Surgeon: [name], MD;  Location: [facility] ORS;  Service: Gynecology;  Laterality: Bilateral;  . LAPAROSCOPIC HYSTERECTOMY N/A 02/26/2016   Procedure: HYSTERECTOMY TOTAL LAPAROSCOPIC;  Surgeon: [name], MD;  Location: [facility] ORS;  Service: Gynecology;  Laterality: N/A;  . SENTINEL NODE BIOPSY N/A 02/26/2016   Procedure: SENTINEL NODE BIOPSY;  Surgeon: [name], MD;  Location: [facility] ORS;  Service: Gynecology;  Laterality: N/A;  . TONSILLECTOMY      Prior to Admission medications   Medication Sig Start Date End  Date Taking? Authorizing Provider  acetaminophen (TYLENOL) 325 MG tablet Take 650 mg by mouth every 6 (six) hours as needed for moderate pain.   Yes [provider]  amLODipine-benazepril (LOTREL) 5-20 MG capsule Take 1 capsule by mouth daily.   Yes [provider]  hydrochlorothiazide (HYDRODIURIL) 25 MG tablet Take 25 mg by mouth daily.   Yes [provider]  levothyroxine (SYNTHROID, LEVOTHROID) 100 MCG tablet Take 100 mcg by mouth every morning. 10/08/16  Yes [provider]  aspirin 81 MG chewable tablet Chew 81 mg by mouth daily.    [provider]    Allergies Sulfa antibiotics and Zinc  Family History  Problem Relation Age of Onset  . Diabetes Father   . Heart disease Father   . Hypertension Father   . Diabetes Paternal Grandmother   . Heart disease Mother   . Hypertension Mother   . Stroke Mother   . Hypertension Sister   . Thyroid disease Sister   . Hypertension Brother     Social History Social History  Substance Use Topics  . Smoking status: Former Research scientist (life sciences)  . Smokeless tobacco: Never Used  . Alcohol use No    Review of Systems  Constitutional: No fever/chills Eyes: No visual changes. ENT: No sore throat. Cardiovascular: Denies chest pain. Respiratory: Denies shortness of breath. Gastrointestinal: No abdominal pain.  No nausea, no vomiting.  No diarrhea.  No constipation. Genitourinary: Negative for dysuria. Musculoskeletal: Negative for back pain. Skin: Negative for rash. Neurological: Negative for headaches, focal weakness or numbness.   ____________________________________________   PHYSICAL EXAM:  VITAL SIGNS: ED Triage Vitals [10/24/16 2056]  Enc Vitals Group     BP (!) 141/71     Pulse Rate 80     Resp 18     Temp 98.6 F (37 C)     Temp Source Oral     SpO2 93 %     Weight 160 lb (72.6 kg)     Height 5\' 2"  (1.575 m)     Head Circumference      Peak Flow      Pain Score 0     Pain Loc       Pain Edu?      Excl. in [place]?     Constitutional: Alert and oriented To self and place but does not know the year which family says is abnormal for her. Well appearing and in no acute distress. Eyes: Conjunctivae are normal.  Head: Atraumatic. Nose: No congestion/rhinnorhea. Mouth/Throat: Mucous membranes are moist.  Neck: No stridor.   Cardiovascular: Normal rate, regular rhythm. Grossly normal heart sounds.   Respiratory: Normal respiratory effort.  No retractions. Lungs CTAB. Gastrointestinal: Soft and nontender. No distention.  Musculoskeletal: No lower extremity tenderness nor edema.  No joint effusions. Neurologic:  Normal speech and language. No gross focal neurologic deficits are appreciated. Skin:  Skin is warm, dry and intact. No rash noted. Psychiatric: Mood and affect are normal. Speech and behavior are normal.  ____________________________________________   LABS (all labs ordered are listed, but only abnormal results are displayed)  Labs Reviewed  COMPREHENSIVE METABOLIC PANEL - Abnormal; Notable for the following:       Result Value   Chloride 99 (*)    Glucose, Bld 114 (*)    ALT 10 (*)    All other components within normal limits  CBC - Abnormal; Notable for the following:    RDW 14.6 (*)    All other components within normal limits  TROPONIN I  TSH  CBG MONITORING, ED   ____________________________________________  EKG  ED ECG REPORT I, Doran Stabler, the attending physician, personally viewed and interpreted this ECG.   Date: 10/24/2016  EKG Time: 2303  Rate: 65  Rhythm: normal sinus rhythm  Axis: Normal  Intervals:none  ST&T Change: No ST segment elevation or depression. Sigel T-wave inversion in aVL.  ____________________________________________  RADIOLOGY  Mild retrocardiac opacity on the chest x-ray which could reflect pneumonia. CT head without any acute  finding. ____________________________________________   PROCEDURES  Procedure(s) performed:   Procedures  Critical Care performed:   ____________________________________________   INITIAL IMPRESSION / ASSESSMENT AND PLAN / ED COURSE  Pertinent labs & imaging results that were available during my care of the patient were reviewed by me and considered in my medical decision making (see chart for details).  ----------------------------------------- 11:20 PM on 10/24/2016 -----------------------------------------  Patient with altered mental status which is worsening over the course of one week. Chest x-ray showing possible retrocardiac opacity. Pending urinalysis at this time. Plan on admitting to the hospital. [facility] cover for community-acquired pneumonia was also cover for UTI.   Plan the family as well as the patient were understanding and wanted to comply. Discussed case with Dr. Estanislado Pandy who will be admitting the patient.      ____________________________________________   FINAL CLINICAL IMPRESSION(S) / ED DIAGNOSES  Altered mental status. Community acquired pneumonia.    NEW MEDICATIONS STARTED DURING THIS VISIT:  New Prescriptions   No medications on file     Note:  This document was  prepared using Systems analyst and may include unintentional dictation errors.     Orbie Pyo, MD 10/24/16 [phone]

## 2016-10-24 NOTE — ED Notes (Signed)
Pt daughter reports that pt has been altered X 1 week. Pt came back from trip to [place] last Satuday and daughter reports that she has not been herself since. Sleeping a lot, weakness, slurred speech, aphasia, disorientation, decreased appetite. Pt will not cooperate with this writer exam. Pt pulling at cords when aroused but lying in bed with eyes shut when she is not stimulated.

## 2016-10-25 ENCOUNTER — Inpatient Hospital Stay: Payer: Medicare Other

## 2016-10-25 LAB — CBC
HCT: 36.5 % (ref 35.0–47.0)
Hemoglobin: 12.5 g/dL (ref 12.0–16.0)
MCH: 30.3 pg (ref 26.0–34.0)
MCHC: 34.2 g/dL (ref 32.0–36.0)
MCV: 88.7 fL (ref 80.0–100.0)
PLATELETS: 266 10*3/uL (ref 150–440)
RBC: 4.12 MIL/uL (ref 3.80–5.20)
RDW: 14 % (ref 11.5–14.5)
WBC: 6.3 10*3/uL (ref 3.6–11.0)

## 2016-10-25 LAB — BASIC METABOLIC PANEL
Anion gap: 7 (ref 5–15)
BUN: 14 mg/dL (ref 6–20)
CALCIUM: 8.2 mg/dL — AB (ref 8.9–10.3)
CO2: 26 mmol/L (ref 22–32)
CREATININE: 0.74 mg/dL (ref 0.44–1.00)
Chloride: 102 mmol/L (ref 101–111)
GFR calc Af Amer: >60 mL/min (ref >60)
GFR calc non Af Amer: >60 mL/min (ref >60)
GLUCOSE: 96 mg/dL (ref 65–99)
Potassium: 3.3 mmol/L — ABNORMAL LOW (ref 3.5–5.1)
Sodium: 135 mmol/L (ref 135–145)

## 2016-10-25 LAB — MAGNESIUM: Magnesium: 1.9 mg/dL (ref 1.7–2.4)

## 2016-10-25 MED ORDER — ONDANSETRON HCL 4 MG/2ML IJ SOLN: 4.0000 mg | Freq: Four times a day (QID) | INTRAMUSCULAR | Status: DC | PRN

## 2016-10-25 MED ORDER — SODIUM CHLORIDE 0.9 % IV SOLN
INTRAVENOUS | Status: DC
  Administered 2016-10-25 (×2): via INTRAVENOUS

## 2016-10-25 MED ORDER — BENAZEPRIL HCL 20 MG PO TABS
20.0000 mg | ORAL_TABLET | Freq: Every day | ORAL | Status: DC
  Administered 2016-10-26: 09:00:00 20 mg via ORAL
  Filled 2016-10-25 (×2): qty 1

## 2016-10-25 MED ORDER — SODIUM CHLORIDE 0.9% FLUSH: 3.0000 mL | INTRAVENOUS | Status: DC | PRN

## 2016-10-25 MED ORDER — ASPIRIN 81 MG PO CHEW
81.0000 mg | CHEWABLE_TABLET | Freq: Every day | ORAL | Status: DC
  Administered 2016-10-26: 81 mg via ORAL
  Filled 2016-10-25: qty 1

## 2016-10-25 MED ORDER — SENNOSIDES-DOCUSATE SODIUM 8.6-50 MG PO TABS: 1.0000 | ORAL_TABLET | Freq: Every evening | ORAL | Status: DC | PRN

## 2016-10-25 MED ORDER — SODIUM CHLORIDE 0.9 % IV SOLN: 250.0000 mL | INTRAVENOUS | Status: DC | PRN

## 2016-10-25 MED ORDER — GUAIFENESIN 100 MG/5ML PO SOLN
5.0000 mL | ORAL | Status: DC | PRN
  Filled 2016-10-25: qty 5

## 2016-10-25 MED ORDER — DEXTROSE 5 % IV SOLN
1.0000 g | INTRAVENOUS | Status: DC
  Administered 2016-10-25: 22:00:00 1 g via INTRAVENOUS
  Filled 2016-10-25 (×2): qty 10

## 2016-10-25 MED ORDER — AMLODIPINE BESY-BENAZEPRIL HCL 5-20 MG PO CAPS: 1.0000 | ORAL_CAPSULE | Freq: Every day | ORAL | Status: DC

## 2016-10-25 MED ORDER — ONDANSETRON HCL 4 MG PO TABS: 4.0000 mg | ORAL_TABLET | Freq: Four times a day (QID) | ORAL | Status: DC | PRN

## 2016-10-25 MED ORDER — AMLODIPINE BESYLATE 5 MG PO TABS
5.0000 mg | ORAL_TABLET | Freq: Every day | ORAL | Status: DC
  Administered 2016-10-26: 5 mg via ORAL
  Filled 2016-10-25: qty 1

## 2016-10-25 MED ORDER — ENOXAPARIN SODIUM 40 MG/0.4ML ~~LOC~~ SOLN
40.0000 mg | SUBCUTANEOUS | Status: DC
  Administered 2016-10-25 – 2016-10-26 (×2): 40 mg via SUBCUTANEOUS
  Filled 2016-10-25 (×2): qty 0.4

## 2016-10-25 MED ORDER — ACETAMINOPHEN 325 MG PO TABS
650.0000 mg | ORAL_TABLET | Freq: Four times a day (QID) | ORAL | Status: DC | PRN
  Administered 2016-10-25: 650 mg via ORAL
  Filled 2016-10-25: qty 2

## 2016-10-25 MED ORDER — POTASSIUM CHLORIDE CRYS ER 20 MEQ PO TBCR
40.0000 meq | EXTENDED_RELEASE_TABLET | Freq: Once | ORAL | Status: AC
  Administered 2016-10-25: 40 meq via ORAL

## 2016-10-25 MED ORDER — ALBUTEROL SULFATE (2.5 MG/3ML) 0.083% IN NEBU: 2.5000 mg | INHALATION_SOLUTION | RESPIRATORY_TRACT | Status: DC | PRN

## 2016-10-25 MED ORDER — PNEUMOCOCCAL VAC POLYVALENT 25 MCG/0.5ML IJ INJ: 0.5000 mL | INJECTION | INTRAMUSCULAR | Status: DC

## 2016-10-25 MED ORDER — SODIUM CHLORIDE 0.9% FLUSH
3.0000 mL | Freq: Two times a day (BID) | INTRAVENOUS | Status: DC
  Administered 2016-10-25 – 2016-10-26 (×3): 3 mL via INTRAVENOUS

## 2016-10-25 MED ORDER — LEVOTHYROXINE SODIUM 100 MCG PO TABS
100.0000 ug | ORAL_TABLET | Freq: Every day | ORAL | Status: DC
  Administered 2016-10-26: 08:00:00 100 ug via ORAL
  Filled 2016-10-25: qty 1

## 2016-10-25 MED ORDER — ENSURE ENLIVE PO LIQD
237.0000 mL | Freq: Two times a day (BID) | ORAL | Status: DC
  Administered 2016-10-26: 10:00:00 237 mL via ORAL

## 2016-10-25 MED ORDER — ACETAMINOPHEN 650 MG RE SUPP: 650.0000 mg | Freq: Four times a day (QID) | RECTAL | Status: DC | PRN

## 2016-10-25 NOTE — Clinical Social Work Note (Signed)
CSW received consult that patient has decompensated in the past 10 days and is no longer able to complete ADLs. CSW will follow pending PT recommendations.  Santiago Bumpers, MSW, Latanya Presser [phone]

## 2016-10-25 NOTE — ED Notes (Signed)
Admitting MD at bedside.

## 2016-10-25 NOTE — H&P (Signed)
[place] [facility] [place] NAME: Vickie Barton    MR#:  [id number]  DATE OF BIRTH:  1930-05-31  DATE OF ADMISSION:  10/24/2016  PRIMARY CARE PHYSICIAN: Patient, No Pcp Per   REQUESTING/REFERRING PHYSICIAN:   CHIEF COMPLAINT:   Chief Complaint  Patient presents with  . Altered Mental Status    HISTORY OF PRESENT ILLNESS: Vickie Barton  is a 83 y.o. female with a known history of Arthritis, endometrial cancer, hyperlipidemia, hypertension, hypothyroidism was brought by the family members because of lethargy and confusion. Patient has been more lethargic for the last couple of days. She is independent in activities of daily living but not for the last few days. No complaints of any cough. No history of any fall and head injury. Patient was evaluated in the emergency room was found to have pneumonia and was started on IV antibiotics. She is oriented to self and place but not completely to time. No complaints of any chest pain. Patient was worked up with CT head which showed no acute intracranial abnormality. Hospitalist service was consulted.  PAST MEDICAL HISTORY:   Past Medical History:  Diagnosis Date  . Arthritis   . Endometrial cancer ([place]) 01/28/2016  . Hyperlipidemia   . Hypertension   . Hypothyroidism     PAST SURGICAL HISTORY: Past Surgical History:  Procedure Laterality Date  . DILATION AND CURETTAGE OF UTERUS    . HYSTEROSCOPY W/D&C N/A 01/28/2016   Procedure: DILATATION AND CURETTAGE /HYSTEROSCOPY;  Surgeon: [name], MD;  Location: [facility] ORS;  Service: Gynecology;  Laterality: N/A;  . LAPAROSCOPIC BILATERAL SALPINGO OOPHERECTOMY Bilateral 02/26/2016   Procedure: LAPAROSCOPIC BILATERAL SALPINGO OOPHORECTOMY;  Surgeon: [name], MD;  Location: [facility] ORS;  Service: Gynecology;  Laterality: Bilateral;  . LAPAROSCOPIC HYSTERECTOMY N/A 02/26/2016   Procedure: HYSTERECTOMY TOTAL LAPAROSCOPIC;  Surgeon: [name], MD;   Location: [facility] ORS;  Service: Gynecology;  Laterality: N/A;  . SENTINEL NODE BIOPSY N/A 02/26/2016   Procedure: SENTINEL NODE BIOPSY;  Surgeon: [name], MD;  Location: [facility] ORS;  Service: Gynecology;  Laterality: N/A;  . TONSILLECTOMY      SOCIAL HISTORY:  Social History  Substance Use Topics  . Smoking status: Former Research scientist (life sciences)  . Smokeless tobacco: Never Used  . Alcohol use No    FAMILY HISTORY:  Family History  Problem Relation Age of Onset  . Diabetes Father   . Heart disease Father   . Hypertension Father   . Diabetes Paternal Grandmother   . Heart disease Mother   . Hypertension Mother   . Stroke Mother   . Hypertension Sister   . Thyroid disease Sister   . Hypertension Brother     DRUG ALLERGIES:  Allergies  Allergen Reactions  . Sulfa Antibiotics Rash  . Zinc Rash    REVIEW OF SYSTEMS:   CONSTITUTIONAL: No fever, has weakness.  EYES: No blurred or double vision.  EARS, NOSE, AND THROAT: No tinnitus or ear pain.  RESPIRATORY: No cough, shortness of breath, wheezing or hemoptysis.  CARDIOVASCULAR: No chest pain, orthopnea, edema.  GASTROINTESTINAL: No nausea, vomiting, diarrhea or abdominal pain.  GENITOURINARY: No dysuria, hematuria.  ENDOCRINE: No polyuria, nocturia,  HEMATOLOGY: No anemia, easy bruising or bleeding SKIN: No rash or lesion. MUSCULOSKELETAL: No joint pain or arthritis.   NEUROLOGIC: No tingling, numbness, weakness.  Has confusion PSYCHIATRY: No anxiety or depression.   MEDICATIONS AT HOME:  Prior to Admission medications   Medication Sig  Start Date End Date Taking? Authorizing Provider  acetaminophen (TYLENOL) 325 MG tablet Take 650 mg by mouth every 6 (six) hours as needed for moderate pain.   Yes [provider]  amLODipine-benazepril (LOTREL) 5-20 MG capsule Take 1 capsule by mouth daily.   Yes [provider]  hydrochlorothiazide (HYDRODIURIL) 25 MG tablet Take 25 mg by mouth daily.   Yes [provider]  levothyroxine (SYNTHROID, LEVOTHROID) 100 MCG tablet Take 100 mcg by mouth every morning. 10/08/16  Yes [provider]  aspirin 81 MG chewable tablet Chew 81 mg by mouth daily.    [provider]      PHYSICAL EXAMINATION:   VITAL SIGNS: Blood pressure 134/68, pulse 66, temperature 98.6 F (37 C), temperature source Oral, resp. rate 20, height 5\' 2"  (1.575 m), weight 72.6 kg (160 lb), SpO2 90 %.  GENERAL:  83 y.o.-year-old patient lying in the bed with no acute distress.  EYES: Pupils equal, round, reactive to light and accommodation. No scleral icterus. Extraocular muscles intact.  HEENT: Head atraumatic, normocephalic. Oropharynx dry and nasopharynx clear.  NECK:  Supple, no jugular venous distention. No thyroid enlargement, no tenderness.  LUNGS: Normal breath sounds bilaterally, rales heard in right lung. No use of accessory muscles of respiration.  CARDIOVASCULAR: S1, S2 normal. No murmurs, rubs, or gallops.  ABDOMEN: Soft, nontender, nondistended. Bowel sounds present. No organomegaly or mass.  EXTREMITIES: No pedal edema, cyanosis, or clubbing.  NEUROLOGIC: Cranial nerves II through XII are intact. Muscle strength 5/5 in all extremities. Sensation intact. Gait not checked.  PSYCHIATRIC: The patient is alert and oriented x 3.  SKIN: No obvious rash, lesion, or ulcer.   LABORATORY PANEL:   CBC  Recent Labs Lab 10/24/16 2102  WBC 7.1  HGB 14.5  HCT 42.2  PLT 327  MCV 87.8  MCH 30.1  MCHC 34.3  RDW 14.6*   ------------------------------------------------------------------------------------------------------------------  Chemistries   Recent Labs Lab 10/24/16 2102  NA 135  K 3.5  CL 99*  CO2 27  GLUCOSE 114*  BUN 15  CREATININE 0.74  CALCIUM 9.0  AST 19  ALT 10*  ALKPHOS 71  BILITOT 0.7   ------------------------------------------------------------------------------------------------------------------ estimated creatinine clearance  is 48.8 mL/min (by C-G formula based on SCr of 0.74 mg/dL). ------------------------------------------------------------------------------------------------------------------  Recent Labs  10/24/16 2102  TSH 13.190*     Coagulation profile No results for input(s): INR, PROTIME in the last 168 hours. ------------------------------------------------------------------------------------------------------------------- No results for input(s): DDIMER in the last 72 hours. -------------------------------------------------------------------------------------------------------------------  Cardiac Enzymes  Recent Labs Lab 10/24/16 2102  TROPONINI <0.03   ------------------------------------------------------------------------------------------------------------------ Invalid input(s): POCBNP  ---------------------------------------------------------------------------------------------------------------  Urinalysis    Component Value Date/Time   COLORURINE AMBER (A) 10/24/2016 2319   APPEARANCEUR CLEAR (A) 10/24/2016 2319   LABSPEC 1.030 10/24/2016 2319   PHURINE 5.0 10/24/2016 2319   GLUCOSEU NEGATIVE 10/24/2016 2319   HGBUR NEGATIVE 10/24/2016 2319   BILIRUBINUR NEGATIVE 10/24/2016 2319   KETONESUR NEGATIVE 10/24/2016 2319   PROTEINUR >=300 (A) 10/24/2016 2319   NITRITE NEGATIVE 10/24/2016 2319   LEUKOCYTESUR NEGATIVE 10/24/2016 2319     RADIOLOGY: Dg Chest 1 View  Result Date: 10/24/2016 CLINICAL DATA:  Acute onset of altered mental status. Initial encounter. EXAM: CHEST 1 VIEW COMPARISON:  CT of the chest performed 02/20/2016 FINDINGS: The lungs are well-aerated. Mild retrocardiac opacity could reflect mild pneumonia, depending on the patient's symptoms. There is no evidence of effusion or pneumothorax. The cardiomediastinal silhouette is mildly enlarged. No acute osseous abnormalities are seen.  IMPRESSION: 1. Mild retrocardiac opacity could reflect mild pneumonia, depending  on the patient's symptoms. 2. Mild cardiomegaly. Electronically Signed   By: Garald Balding M.D.   On: 10/24/2016 23:14   Ct Head Wo Contrast  Result Date: 10/24/2016 CLINICAL DATA:  Initial evaluation for acute altered mental status. EXAM: CT HEAD WITHOUT CONTRAST TECHNIQUE: Contiguous axial images were obtained from the base of the skull through the vertex without intravenous contrast. COMPARISON:  None. FINDINGS: Brain: Age related cerebral atrophy with mild chronic microvascular ischemic disease. No acute intracranial hemorrhage. No evidence for acute large vessel territory infarct. No mass lesion, midline shift or mass effect. No hydrocephalus. No extra-axial fluid collection. Vascular: No hyperdense vessel. Scattered intracranial atherosclerosis noted. Skull: Scalp soft tissues within normal limits.  Calvarium intact. Sinuses/Orbits: Globes and orbital soft tissues within normal limits. Paranasal sinuses and mastoid air cells are clear. IMPRESSION: 1. No acute intracranial process. 2. Generalized age appropriate cerebral atrophy with mild chronic small vessel ischemic disease. Electronically Signed   By: Jeannine Boga M.D.   On: 10/24/2016 23:07    EKG: Orders placed or performed during the hospital encounter of 10/24/16  . ED EKG  . ED EKG  . EKG 12-Lead  . EKG 12-Lead    IMPRESSION AND PLAN: 83 year old female patient history of hypothyroidism, arthritis, endometrial cancer, hypertension, hyperlipidemia presented to the emergency room with weakness, confusion. Admitting diagnosis 1. Disorientation 2. Community-acquired pneumonia 3. Hypertension 4. Hyperlipidemia Treatment plan Admit patient to medical floor Start patient on IV Rocephin and IV Zithromax antibiotic IV fluid hydration Follow-up urinalysis and culture DVT prophylaxis subcutaneous Lovenox 40 MG daily   All the records are reviewed and case discussed with ED provider. Management plans discussed with the  patient, family and they are in agreement.  CODE STATUS:FULL CODE Surrogate Decision maker : Daughter Code Status History    This patient does not have a recorded code status. Please follow your organizational policy for patients in this situation.       TOTAL TIME TAKING CARE OF THIS PATIENT: 50 minutes.    Saundra Shelling M.D on 10/25/2016 at 12:21 AM  Between 7am to 6pm - Pager - [phone]  After 6pm go to [email or web address] - password [email or web address] [facility]  Office  [phone]  CC: Primary care physician; Patient, No Pcp Per

## 2016-10-25 NOTE — Progress Notes (Signed)
Pharmacy Antibiotic Note  Vickie Barton is a 83 y.o. female admitted on 10/24/2016 with pneumonia.  Pharmacy has been consulted for ceftriaxone dosing.  Plan: Patient received azithromycin 500 mg and ceftriaxone 1g IV x 1 in ED  Will continue azithromycin 500 mg and ceftriaxone 1g IV daily  Height: 5\' 2"  (157.5 cm) Weight: 160 lb (72.6 kg) IBW/kg (Calculated) : 50.1  Temp (24hrs), Avg:98.6 F (37 C), Min:98.6 F (37 C), Max:98.6 F (37 C)   Recent Labs Lab 10/24/16 2102  WBC 7.1  CREATININE 0.74    Estimated Creatinine Clearance: 48.8 mL/min (by C-G formula based on SCr of 0.74 mg/dL).    Allergies  Allergen Reactions  . Sulfa Antibiotics Rash  . Zinc Rash    Thank you for allowing pharmacy to be a part of this patient's care.  Tobie Lords, PharmD, BCPS Clinical Pharmacist 10/25/2016

## 2016-10-25 NOTE — Progress Notes (Signed)
Initial Nutrition Assessment  DOCUMENTATION CODES:   Not applicable  INTERVENTION:  Provide Ensure Enlive po BID, each supplement provides 350 kcal and 20 grams of protein.   NUTRITION DIAGNOSIS:   Inadequate oral intake related to lethargy/confusion as evidenced by meal completion < 50%.  GOAL:   Patient will meet greater than or equal to 90% of their needs  MONITOR:   PO intake, Supplement acceptance, Labs, Weight trends, I & O's  REASON FOR ASSESSMENT:   Malnutrition Screening Tool    ASSESSMENT:   83 year old female with PMHx of hypothyroidism, HTN, HLD, hx of endometrial cancer 01/2016 s/p laparoscopic hysterectomy and bilateral salpingo oophorectomy on 02/26/2016 who presented with lethargy/confusion found to have acute metabolic encephalopathy, PNA.   -Pending SLP consult. Noted in chart patient was advanced to soft diet after passing bedside swallow evaluation.  Spoke with patient at bedside. No family members present at time of assessment. Patient reports that her appetite is good. She reports that she eats 3 meals per day. Patient denies any N/V, abdominal pain, constipation/diarrhea, or difficulty chewing/swallowing.  Patient denies any weight loss. Per chart patient was 176 lbs on 02/26/2016. She has lost 14.4 lbs (8.2% body weight) over 9 months, which is not significant for time frame.  Medications reviewed and include: levothyroxine, potassium chloride 40 mEq once today, NS @ 75 ml/hr, azithromycin, ceftriaxone.  Labs reviewed: Potassium 3.3.   Nutrition-Focused physical exam completed. Findings are mild-moderate fat depletion in upper arm region, mild-moderate muscle depletion (clavicle bone region, clavicle and acromion bone region, scapular bone region), and mild edema.   Patient does not meet criteria for malnutrition at this time.  Discussed with RN. Patient intermittently confused.  Diet Order:  DIET SOFT Room service appropriate? Yes; Fluid  consistency: Thin  Skin:  Wound (see comment) (MSAD to b/l breasts)  Last BM:  Unknown  Height:   Ht Readings from Last 1 Encounters:  10/25/16 5\' 2"  (1.575 m)    Weight:   Wt Readings from Last 1 Encounters:  10/25/16 161 lb 12.8 oz (73.4 kg)    Ideal Body Weight:  50 kg  BMI:  Body mass index is 29.59 kg/m.  Estimated Nutritional Needs:   Kcal:  1375-1600 (MSJ x 1.2-1.4)  Protein:  73-88 grams (1-1.2 grams/kg)  Fluid:  1.8 L/day (25 ml/kg)  EDUCATION NEEDS:   No education needs identified at this time  Willey Blade, MS, RD, LDN Pager: [phone] After Hours Pager: [phone]

## 2016-10-25 NOTE — ED Notes (Signed)
Pt taken to floor by this RN with all of her belongings. Accompanied by family. Pt refused repeat VS prior to transport, takes off monitor cords.

## 2016-10-25 NOTE — Progress Notes (Signed)
[place] at [place] NAME: Alvin Rubano    MR#:  [id number]  DATE OF BIRTH:  1931/03/17  SUBJECTIVE:  CHIEF COMPLAINT:   Chief Complaint  Patient presents with  . Altered Mental Status   The patient denies any complaints. She confused but AAOx3. Per RN, the patient sometimes confused. REVIEW OF SYSTEMS:  Review of Systems  Constitutional: Negative for chills, fever and malaise/fatigue.  HENT: Negative for sore throat.   Eyes: Negative for blurred vision and double vision.  Respiratory: Negative for cough, hemoptysis, shortness of breath, wheezing and stridor.   Cardiovascular: Negative for chest pain, palpitations, orthopnea and leg swelling.  Gastrointestinal: Negative for abdominal pain, blood in stool, diarrhea, melena, nausea and vomiting.  Genitourinary: Negative for dysuria, flank pain and hematuria.  Musculoskeletal: Negative for back pain and joint pain.  Neurological: Negative for dizziness, sensory change, focal weakness, seizures, loss of consciousness, weakness and headaches.  Endo/Heme/Allergies: Negative for polydipsia.  Psychiatric/Behavioral: Negative for depression. The patient is not nervous/anxious.     DRUG ALLERGIES:   Allergies  Allergen Reactions  . Sulfa Antibiotics Rash  . Zinc Rash   VITALS:  Blood pressure (!) 113/59, pulse (!) 55, temperature 98.7 F (37.1 C), temperature source Oral, resp. rate 18, height 5\' 2"  (1.575 m), weight 161 lb 12.8 oz (73.4 kg), SpO2 94 %. PHYSICAL EXAMINATION:  Physical Exam  Constitutional: She is oriented to person, place, and time and well-developed, well-nourished, and in no distress.  HENT:  Head: Normocephalic.  Mouth/Throat: Oropharynx is clear and moist.  Eyes: Pupils are equal, round, and reactive to light. Conjunctivae and EOM are normal. No scleral icterus.  Neck: Normal range of motion. Neck supple. No JVD present. No tracheal deviation present.    Cardiovascular: Normal rate, regular rhythm and normal heart sounds.  Exam reveals no gallop.   No murmur heard. Pulmonary/Chest: Effort normal and breath sounds normal. No respiratory distress. She has no wheezes. She has no rales.  Abdominal: Soft. Bowel sounds are normal. She exhibits no distension. There is no tenderness. There is no rebound.  Musculoskeletal: Normal range of motion. She exhibits no edema or tenderness.  Neurological: She is alert and oriented to person, place, and time. No cranial nerve deficit.  Skin: No rash noted. No erythema.  Psychiatric: Affect normal.   LABORATORY PANEL:  Female CBC  Recent Labs Lab 10/25/16 0515  WBC 6.3  HGB 12.5  HCT 36.5  PLT 266   ------------------------------------------------------------------------------------------------------------------ Chemistries   Recent Labs Lab 10/24/16 2102 10/25/16 0515  NA 135 135  K 3.5 3.3*  CL 99* 102  CO2 27 26  GLUCOSE 114* 96  BUN 15 14  CREATININE 0.74 0.74  CALCIUM 9.0 8.2*  MG  --  1.9  AST 19  --   ALT 10*  --   ALKPHOS 71  --   BILITOT 0.7  --    RADIOLOGY:  Dg Chest 1 View  Result Date: 10/24/2016 CLINICAL DATA:  Acute onset of altered mental status. Initial encounter. EXAM: CHEST 1 VIEW COMPARISON:  CT of the chest performed 02/20/2016 FINDINGS: The lungs are well-aerated. Mild retrocardiac opacity could reflect mild pneumonia, depending on the patient's symptoms. There is no evidence of effusion or pneumothorax. The cardiomediastinal silhouette is mildly enlarged. No acute osseous abnormalities are seen. IMPRESSION: 1. Mild retrocardiac opacity could reflect mild pneumonia, depending on the patient's symptoms. 2. Mild cardiomegaly. Electronically Signed   By: Garald Balding  M.D.   On: 10/24/2016 23:14   Ct Head Wo Contrast  Result Date: 10/24/2016 CLINICAL DATA:  Initial evaluation for acute altered mental status. EXAM: CT HEAD WITHOUT CONTRAST TECHNIQUE: Contiguous axial  images were obtained from the base of the skull through the vertex without intravenous contrast. COMPARISON:  None. FINDINGS: Brain: Age related cerebral atrophy with mild chronic microvascular ischemic disease. No acute intracranial hemorrhage. No evidence for acute large vessel territory infarct. No mass lesion, midline shift or mass effect. No hydrocephalus. No extra-axial fluid collection. Vascular: No hyperdense vessel. Scattered intracranial atherosclerosis noted. Skull: Scalp soft tissues within normal limits.  Calvarium intact. Sinuses/Orbits: Globes and orbital soft tissues within normal limits. Paranasal sinuses and mastoid air cells are clear. IMPRESSION: 1. No acute intracranial process. 2. Generalized age appropriate cerebral atrophy with mild chronic small vessel ischemic disease. Electronically Signed   By: Jeannine Boga M.D.   On: 10/24/2016 23:07   ASSESSMENT AND PLAN:   83 year old female patient history of hypothyroidism, arthritis, endometrial cancer, hypertension, hyperlipidemia presented to the emergency room with weakness, confusion.  1. Acute metabolic encephalopathy, possible due to pneumonia. Improving. Aspiration and fall precaution, PT evaluation. Speech study. Neuro check.  2. Community-acquired pneumonia Continue Zithromax and Rocephin IV. Follow-up cultures. 3. Hypertension. Continue hypertension medication. 4. Hyperlipidemia  All the records are reviewed and case discussed with Care Management/Social Worker. Management plans discussed with the patient, family and they are in agreement.  CODE STATUS: Full Code  TOTAL TIME TAKING CARE OF THIS PATIENT: 35 minutes.   More than 50% of the time was spent in counseling/coordination of care: YES  POSSIBLE D/C IN 2 DAYS, DEPENDING ON CLINICAL CONDITION.   Demetrios Loll M.D on 10/25/2016 at 12:19 PM  Between 7am to 6pm - Pager - [phone]  After 6pm go to [email or web address] - Proofreader  [employer]  [facility]  Office  [phone]  CC: Primary care physician; Patient, No Pcp Per  Note: This dictation was prepared with Dragon dictation along with smaller phrase technology. Any transcriptional errors that result from this process are unintentional.

## 2016-10-25 NOTE — Progress Notes (Signed)
Passed swallow screen. Soft diet ordered per daughter.

## 2016-10-25 NOTE — Progress Notes (Signed)
Patient, at times, can answer questions appropriately.  At other times, can't answer my questions or follow commands.  Pt did not understand how to swallow tylenol.  This nurse will get an order for NPO status until speech can assess her.  According to family, pt has had a hard time swallowing whole foods, so they have been giving her soft foods.  She has had a hard time with those as well. This nurse placed safety mitts on pt.  She pulled an IV out on the way from the ED, and has continually tried to pull at the other one, her telemetry leads and her gown.

## 2016-10-25 NOTE — ED Notes (Signed)
Report to Caren Griffins, RN on 1C.

## 2016-10-26 LAB — BASIC METABOLIC PANEL
ANION GAP: 7 (ref 5–15)
BUN: 9 mg/dL (ref 6–20)
CALCIUM: 8.4 mg/dL — AB (ref 8.9–10.3)
CO2: 27 mmol/L (ref 22–32)
Chloride: 105 mmol/L (ref 101–111)
Creatinine, Ser: 0.78 mg/dL (ref 0.44–1.00)
GFR calc Af Amer: >60 mL/min (ref >60)
GFR calc non Af Amer: >60 mL/min (ref >60)
GLUCOSE: 95 mg/dL (ref 65–99)
Potassium: 3.4 mmol/L — ABNORMAL LOW (ref 3.5–5.1)
Sodium: 139 mmol/L (ref 135–145)

## 2016-10-26 LAB — URINE CULTURE: CULTURE: NO GROWTH

## 2016-10-26 MED ORDER — LEVOFLOXACIN 250 MG PO TABS: 250.0000 mg | ORAL_TABLET | Freq: Every day | ORAL | 0 refills | Status: DC

## 2016-10-26 MED ORDER — POTASSIUM CHLORIDE CRYS ER 20 MEQ PO TBCR
40.0000 meq | EXTENDED_RELEASE_TABLET | Freq: Once | ORAL | Status: AC
  Administered 2016-10-26: 09:00:00 40 meq via ORAL
  Filled 2016-10-26: qty 2

## 2016-10-26 MED ORDER — LEVOTHYROXINE SODIUM 150 MCG PO TABS: 150.0000 ug | ORAL_TABLET | Freq: Every day | ORAL | 0 refills | Status: DC

## 2016-10-26 MED ORDER — LEVOFLOXACIN 250 MG PO TABS: 250.0000 mg | ORAL_TABLET | Freq: Every day | ORAL | Status: DC

## 2016-10-26 MED ORDER — LEVOFLOXACIN 500 MG PO TABS: 500.0000 mg | ORAL_TABLET | Freq: Once | ORAL | Status: DC

## 2016-10-26 MED ORDER — LEVOTHYROXINE SODIUM 50 MCG PO TABS: 150.0000 ug | ORAL_TABLET | Freq: Every day | ORAL | Status: DC

## 2016-10-26 NOTE — Care Management Note (Signed)
Case Management Note  Patient Details  Name: Vickie Barton MRN: [id number] Date of Birth: Jan 01, 1931  Subjective/Objective:    Admitted to [facility] with the diagnosis of pneumonia. Lives with daughter, Vivien Rota, [phone]).Orginally from [place] has livd with daughter x 2 years.  Last seen Dr. Kary Kos 3 weeks ago. Prescriptions are filled at [facility] on [address]. No home health. No skilled facility. No home oxygen. No equipment in the home. Takes care of all basic activities of daily living herself, drives. No falls. Good appetite                 Action/Plan: Physical therapy evaluation completed. Recommending outpatient therapy.  [name] is in agreement with this plan. Will get referral signed and fax to Outpatient Rehabilitation.   Expected Discharge Date:  10/26/16               Expected Discharge Plan:     In-House Referral:     Discharge planning Services     Post Acute Care Choice:    Choice offered to:     DME Arranged:    DME Agency:     HH Arranged:    HH Agency:     Status of Service:     If discussed at [employer], dates discussed:    Additional Comments:  Shelbie Ammons, RN MSN CCM Care Management [phone] 10/26/2016, 11:00 AM

## 2016-10-26 NOTE — Discharge Summary (Signed)
[place] at [place] NAME: Vickie Barton    MR#:  [id number]  DATE OF BIRTH:  [birth year]  DATE OF ADMISSION:  10/24/2016   ADMITTING PHYSICIAN: Saundra Shelling, MD  DATE OF DISCHARGE: 10/26/2016  1:01 PM  PRIMARY CARE PHYSICIAN: Patient, No Pcp Per   ADMISSION DIAGNOSIS:  Altered mental status, unspecified altered mental status type [R41.82] Community acquired pneumonia, unspecified laterality [J18.9] DISCHARGE DIAGNOSIS:  Active Problems:   CAP (community acquired pneumonia) Acute metabolic encephalopathy, possible due to cognitive impairment due to hypothyroidism. SECONDARY DIAGNOSIS:   Past Medical History:  Diagnosis Date  . Arthritis   . Endometrial cancer ([place]) 01/28/2016  . Hyperlipidemia   . Hypertension   . Hypothyroidism    HOSPITAL COURSE:   83 year old female patient history of hypothyroidism, arthritis, endometrial cancer, hypertension, hyperlipidemia presented to the emergency room with weakness, confusion.  1.Acute metabolic encephalopathy, possible due to confusion, cognitive impairment and memory loss secondary to worsening hypothyroidism. Improved. Aspiration and fall precaution, PT evaluation: Outpatient PT.. CTH and MRI brain didn't show acute CVA. TSH is 13.19. Increase Synthroid from 100 g 150 micrograms. Follow-up PCP to adjust dose.  2.Community-acquired pneumonia She was given Zithromax and Rocephin IV. Changed to by mouth Levaquin. 3.Hypertension. Continue hypertension medication. 4.Hyperlipidemia Hypokalemia. Give potassium supplement. Magnesium level is normal.  DISCHARGE CONDITIONS:  Stable, discharged to home today. CONSULTS OBTAINED:   DRUG ALLERGIES:   Allergies  Allergen Reactions  . Sulfa Antibiotics Rash  . Zinc Rash   DISCHARGE MEDICATIONS:   Allergies as of 10/26/2016      Reactions   Sulfa Antibiotics Rash   Zinc Rash      Medication List    TAKE these medications     acetaminophen 325 MG tablet Commonly known as:  TYLENOL Take 650 mg by mouth every 6 (six) hours as needed for moderate pain.   amLODipine-benazepril 5-20 MG capsule Commonly known as:  LOTREL Take 1 capsule by mouth daily.   aspirin 81 MG chewable tablet Chew 81 mg by mouth daily.   hydrochlorothiazide 25 MG tablet Commonly known as:  HYDRODIURIL Take 25 mg by mouth daily.   levofloxacin 250 MG tablet Commonly known as:  LEVAQUIN Take 1 tablet (250 mg total) by mouth daily at 6 PM.   levothyroxine 150 MCG tablet Commonly known as:  SYNTHROID, LEVOTHROID Take 1 tablet (150 mcg total) by mouth daily before breakfast. What changed:  medication strength  how much to take  when to take this        DISCHARGE INSTRUCTIONS:  See AVS.  If you experience worsening of your admission symptoms, develop shortness of breath, life threatening emergency, suicidal or homicidal thoughts you must seek medical attention immediately by calling 911 or calling your MD immediately  if symptoms less severe.  You Must read complete instructions/literature along with all the possible adverse reactions/side effects for all the Medicines you take and that have been prescribed to you. Take any new Medicines after you have completely understood and accpet all the possible adverse reactions/side effects.   Please note  You were cared for by a hospitalist during your hospital stay. If you have any questions about your discharge medications or the care you received while you were in the hospital after you are discharged, you can call the unit and asked to speak with the hospitalist on call if the hospitalist that took care of you is not available. Once you are discharged, your  primary care physician will handle any further medical issues. Please note that NO REFILLS for any discharge medications will be authorized once you are discharged, as it is imperative that you return to your primary care physician  (or establish a relationship with a primary care physician if you do not have one) for your aftercare needs so that they can reassess your need for medications and monitor your lab values.    On the day of Discharge:  VITAL SIGNS:  Blood pressure 131/80, pulse (!) 57, temperature 97.9 F (36.6 C), temperature source Oral, resp. rate 17, height 5\' 2"  (1.575 m), weight 161 lb 12.8 oz (73.4 kg), SpO2 95 %. PHYSICAL EXAMINATION:  GENERAL:  83 y.o.-year-old patient lying in the bed with no acute distress.  EYES: Pupils equal, round, reactive to light and accommodation. No scleral icterus. Extraocular muscles intact.  HEENT: Head atraumatic, normocephalic. Oropharynx and nasopharynx clear.  NECK:  Supple, no jugular venous distention. No thyroid enlargement, no tenderness.  LUNGS: Normal breath sounds bilaterally, no wheezing, rales,rhonchi or crepitation. No use of accessory muscles of respiration.  CARDIOVASCULAR: S1, S2 normal. No murmurs, rubs, or gallops.  ABDOMEN: Soft, non-tender, non-distended. Bowel sounds present. No organomegaly or mass.  EXTREMITIES: No pedal edema, cyanosis, or clubbing.  NEUROLOGIC: Cranial nerves II through XII are intact. Muscle strength 5/5 in all extremities. Sensation intact. Gait not checked.  PSYCHIATRIC: The patient is alert and oriented x 3.  SKIN: No obvious rash, lesion, or ulcer.  DATA REVIEW:   CBC  Recent Labs Lab 10/25/16 0515  WBC 6.3  HGB 12.5  HCT 36.5  PLT 266    Chemistries   Recent Labs Lab 10/24/16 2102 10/25/16 0515 10/26/16 0509  NA 135 135 139  K 3.5 3.3* 3.4*  CL 99* 102 105  CO2 27 26 27   GLUCOSE 114* 96 95  BUN 15 14 9   CREATININE 0.74 0.74 0.78  CALCIUM 9.0 8.2* 8.4*  MG  --  1.9  --   AST 19  --   --   ALT 10*  --   --   ALKPHOS 71  --   --   BILITOT 0.7  --   --      Microbiology Results  Results for orders placed or performed during the hospital encounter of 10/24/16  Urine Culture     Status: None    Collection Time: 10/24/16 11:19 PM  Result Value Ref Range Status   Specimen Description URINE, RANDOM  Final   Special Requests NONE  Final   Culture   Final    NO GROWTH Performed at [facility], [place] [address] [place], [place] [address]    Report Status 10/26/2016 FINAL  Final  Blood culture (routine x 2)     Status: None (Preliminary result)   Collection Time: 10/24/16 11:34 PM  Result Value Ref Range Status   Specimen Description BLOOD RIGHT HAND  Final   Special Requests   Final    BOTTLES DRAWN AEROBIC AND ANAEROBIC Blood Culture adequate volume   Culture NO GROWTH 2 DAYS  Final   Report Status PENDING  Incomplete  Blood culture (routine x 2)     Status: None (Preliminary result)   Collection Time: 10/24/16 11:34 PM  Result Value Ref Range Status   Specimen Description BLOOD LEFT ANTECUBITAL  Final   Special Requests   Final    BOTTLES DRAWN AEROBIC AND ANAEROBIC Blood Culture adequate volume   Culture NO GROWTH  2 DAYS  Final   Report Status PENDING  Incomplete    RADIOLOGY:  Mr Brain 33 Contrast  Result Date: 10/25/2016 CLINICAL DATA:  83 year old female with altered mental status, worsening confusion for 1 week. EXAM: MRI HEAD WITHOUT CONTRAST TECHNIQUE: Multiplanar, multiecho pulse sequences of the brain and surrounding structures were obtained without intravenous contrast. COMPARISON:  Head CT without contrast 10/24/2016. FINDINGS: Brain: Study is intermittently degraded by motion artifact despite repeated imaging attempts. No restricted diffusion to suggest acute infarction. No midline shift, mass effect, evidence of mass lesion, ventriculomegaly, extra-axial collection or acute intracranial hemorrhage. Cervicomedullary junction and pituitary are within normal limits. Pearline Cables and white matter signal is within normal limits for age throughout the brain. No cortical encephalomalacia or chronic cerebral blood products are identified. Vascular: Major intracranial vascular  flow voids are preserved. Skull and upper cervical spine: Negative. Normal bone marrow signal. Sinuses/Orbits: Orbits soft tissues appear normal. Visualized paranasal sinuses and mastoids are stable and well pneumatized. Other: Grossly normal visualized internal auditory structures. Scalp and face soft tissues appear negative. IMPRESSION: No acute intracranial abnormality identified. Normal for age noncontrast MRI appearance of the brain. Electronically Signed   By: Genevie Ann M.D.   On: 10/25/2016 17:56     Management plans discussed with the patient, her daughter and they are in agreement.  CODE STATUS: Full Code   TOTAL TIME TAKING CARE OF THIS PATIENT: 33 minutes.    Demetrios Loll M.D on 10/26/2016 at 3:51 PM  Between 7am to 6pm - Pager - [phone]  After 6pm go to [email or web address] - Proofreader  [employer] [facility]  Office  [phone]  CC: Primary care physician; Patient, No Pcp Per   Note: This dictation was prepared with Dragon dictation along with smaller phrase technology. Any transcriptional errors that result from this process are unintentional.

## 2016-10-26 NOTE — Evaluation (Signed)
Physical Therapy Evaluation Patient Details Name: Vickie Barton MRN: [id number] DOB: November 21, 1930 Today's Date: 10/26/2016   History of Present Illness  Pt is a 83 y/o F who presented with lethargy and confusion. Pt was found to have pneumonia.  Patient was worked up with CT head which showed no acute intracranial abnormality. Pt's PMH includes endometrial cancer, hypothyroidism.    Clinical Impression  Pt admitted with above diagnosis. PTA pt ambulating ind without AD and denies any falls over the past 6 months.  She requires assist for bathing but is otherwise independent with ADLs.  She will have 24/7 assist/supervision from daughter whom she lives with, at d/c.  She currently presents at up to supervision level for all aspects of mobility, recommending OPPT to address mild balance deficits.  Pt's main impairment is her cognition.  All further PT needs can be addressed in OPPT.  PT will sign off acutely.    Follow Up Recommendations Outpatient PT;Supervision/Assistance - 24 hour    Equipment Recommendations  None recommended by PT    Recommendations for Other Services       Precautions / Restrictions Precautions Precautions: Fall Restrictions Weight Bearing Restrictions: No      Mobility  Bed Mobility Overal bed mobility: Independent             General bed mobility comments: Pt requires cues to remain focused on task of supine>sit due to distractibility and pt repeatedly reaches for blanket to cover back up.  No physical assist required, no increased effort to perform.  Transfers Overall transfer level: Needs assistance Equipment used: None Transfers: Sit to/from Stand Sit to Stand: Supervision         General transfer comment: Supervision for safety.  No sign of instability.  Ambulation/Gait Ambulation/Gait assistance: Min guard Ambulation Distance (Feet): 200 Feet Assistive device: None Gait Pattern/deviations: Step-through pattern   Gait velocity  interpretation: at or above normal speed for age/gender General Gait Details: Pt mildly unsteady but no LOB and no assist or cues needed.    Stairs            Wheelchair Mobility    Modified Rankin (Stroke Patients Only)       Balance Overall balance assessment: Needs assistance Sitting-balance support: No upper extremity supported;Feet supported Sitting balance-Leahy Scale: Good     Standing balance support: No upper extremity supported;During functional activity Standing balance-Leahy Scale: Fair Standing balance comment: Pt able to stand statically and ambulate with only up to mild instability.  Likely would lose her balance with perturbation.             High level balance activites: Sudden stops;Head turns;Turns;Direction changes High Level Balance Comments: No signs of instability with higher level balance activities.  Pt unable to cognitively follow commands to perform sudden stops.             Pertinent Vitals/Pain Pain Assessment: Faces Faces Pain Scale: Hurts little more Pain Location: Bil feet/ LEs when donning socks Pain Descriptors / Indicators: Grimacing Pain Intervention(s): Limited activity within patient's tolerance;Monitored during session    Home Living Family/patient expects to be discharged to:: Private residence Living Arrangements: Children Available Help at Discharge: Family;Available 24 hours/day Type of Home: House Home Access: Stairs to enter Entrance Stairs-Rails: Left ("Oh let's say the left side") Entrance Stairs-Number of Steps:  (pt unable to recall) Home Layout: One level Home Equipment: Shower seat;Hand held shower head      Prior Function Level of Independence: Needs assistance  Gait / Transfers Assistance Needed: Pt ambulates without AD, denies any falls in the past 6 months.    ADL's / Homemaking Assistance Needed: Reports she is independent with dressing and driving.  Daugther does the cooking, cleaning.  Daughter  assists with bathing (pt gets in the shower for this).          Hand Dominance        Extremity/Trunk Assessment   Upper Extremity Assessment Upper Extremity Assessment: Overall WFL for tasks assessed    Lower Extremity Assessment Lower Extremity Assessment:  (BLE strength grossly 4/5)       Communication      Cognition Arousal/Alertness: Awake/alert Behavior During Therapy: Flat affect Overall Cognitive Status: No family/caregiver present to determine baseline cognitive functioning                                 General Comments: Per SLP the pt has presented with cognitive deficits at baseline.  No family to confirm baseline cognitive status.  She was not oriented to situation.  Pt is unable to follow multi-step commands and requires repeated single step commands.      General Comments      Exercises     Assessment/Plan    PT Assessment Patent does not need any further PT services  PT Problem List         PT Treatment Interventions      PT Goals (Current goals can be found in the Care Plan section)  Acute Rehab PT Goals Patient Stated Goal: non stated PT Goal Formulation: With patient Time For Goal Achievement: 11/09/16 Potential to Achieve Goals: Good    Frequency     Barriers to discharge        Co-evaluation               AM-PAC PT "6 Clicks" Daily Activity  Outcome Measure Difficulty turning over in bed (including adjusting bedclothes, sheets and blankets)?: None Difficulty moving from lying on back to sitting on the side of the bed? : None Difficulty sitting down on and standing up from a chair with arms (e.g., wheelchair, bedside commode, etc,.)?: A Little Help needed moving to and from a bed to chair (including a wheelchair)?: A Little Help needed walking in hospital room?: A Little Help needed climbing 3-5 steps with a railing? : A Little 6 Click Score: 20    End of Session Equipment Utilized During Treatment: Gait  belt Activity Tolerance: Patient tolerated treatment well Patient left: in chair;with call bell/phone within reach;with chair alarm set Nurse Communication: Mobility status PT Visit Diagnosis: Unsteadiness on feet (R26.81)    Time: 0071-2197 PT Time Calculation (min) (ACUTE ONLY): 15 min   Charges:   PT Evaluation $PT Eval Low Complexity: 1 Low     PT G CodesCollie Siad PT, DPT 10/26/2016, 9:30 AM

## 2016-10-26 NOTE — Discharge Instructions (Signed)
Fall and aspiration precaution. Outpatient PT.

## 2016-10-26 NOTE — Evaluation (Signed)
Clinical/Bedside Swallow Evaluation Patient Details  Name: Vickie Barton MRN: [id number] Date of Birth: 1930/07/19  Today's Date: 10/26/2016 Time: SLP Start Time (ACUTE ONLY): 0805 SLP Stop Time (ACUTE ONLY): 0905 SLP Time Calculation (min) (ACUTE ONLY): 60 min  Past Medical History:  Past Medical History:  Diagnosis Date  . Arthritis   . Endometrial cancer ([place]) 01/28/2016  . Hyperlipidemia   . Hypertension   . Hypothyroidism    Past Surgical History:  Past Surgical History:  Procedure Laterality Date  . DILATION AND CURETTAGE OF UTERUS    . HYSTEROSCOPY W/D&C N/A 01/28/2016   Procedure: DILATATION AND CURETTAGE /HYSTEROSCOPY;  Surgeon: [name], MD;  Location: [facility];  Service: Gynecology;  Laterality: N/A;  . LAPAROSCOPIC BILATERAL SALPINGO OOPHERECTOMY Bilateral 02/26/2016   Procedure: LAPAROSCOPIC BILATERAL SALPINGO OOPHORECTOMY;  Surgeon: [name], MD;  Location: [facility];  Service: Gynecology;  Laterality: Bilateral;  . LAPAROSCOPIC HYSTERECTOMY N/A 02/26/2016   Procedure: HYSTERECTOMY TOTAL LAPAROSCOPIC;  Surgeon: [name], MD;  Location: [facility];  Service: Gynecology;  Laterality: N/A;  . SENTINEL NODE BIOPSY N/A 02/26/2016   Procedure: SENTINEL NODE BIOPSY;  Surgeon: [name], MD;  Location: [facility];  Service: Gynecology;  Laterality: N/A;  . TONSILLECTOMY     HPI:  Pt is a 83 y.o. female with a known history of Arthritis, endometrial cancer, hyperlipidemia, hypertension, hypothyroidism was brought by the family members because of lethargy and confusion. Patient has been more lethargic for the last couple of days. She is independent in activities of daily living but not for the last few days. No complaints of any cough. No history of any fall and head injury. Patient was evaluated in the emergency room w/ possible pneumonia and was started on IV antibiotics. She is oriented to self and place but not completely to time. Pt is currently awake,  verbally answering basic questions, although, pt appears to exhibit some Cognitive deficits when answering some questions (orientation, where she lived before [place], morning events). Also noted pt had difficulty following multi-step commands but did better w/ single-step commands.    Assessment / Plan / Recommendation Clinical Impression  Pt appears to present w/ min Oral phase dysphagia suspect impacted by Cognitive status; NO pharyngeal phase deficits appreciated during po intake. Pt appears at reduced risk for aspiration when following general aspiration precautions and following a min modified diet and reducing environmental distractions during meals. Pt consumed po trials of thin liquids, purees, and mech soft foods w/ no overt s/s of aspiration noted but min Oral phase dysphagia noted c/b increased munching and oral phase time w/ increased textures/foods; timely swallowing noted w/ thin liquids via cup/straw though. Pt exhibited more of a munching pattern when masticating foods w/ increased oral time for bolus management b/f swallowing. This is often seen w/ Cognitive decline as well as the characteristics of being easily distracted by environmental stimuli such as what pt was doing(ie. picking at covers, menu and phone). Pt was verbally responsive w/ min Cognitive inconsistencies noted. Recommend f/u w/ Neurology for full Cognitive assessment and management to determine any further needs in home setting(ie. supervision, safety). Recommend a mech soft diet w/ thin liquids; general aspiration precautions; Pills given in a Puree for easier swallowing if pt is having difficulty coordinating the oral phase of swallowing Pills(again, often seen w/ Cognitive decline). NSG and MD updated.  SLP Visit Diagnosis: Dysphagia, oral phase (R13.11)    Aspiration Risk   (reduced)    Diet  Recommendation  Mech soft diet w/ Thin liquids; general aspiration precautions; support during meals including reducing  distractions at mealtime  Medication Administration: Whole meds with puree (as needed for easier swallowing)    Other  Recommendations Recommended Consults:  (Dietician f/u as needed) Oral Care Recommendations: Oral care BID;Patient independent with oral care;Staff/trained caregiver to provide oral care Other Recommendations:  (n/a)   Follow up Recommendations None      Frequency and Duration  n/a         Prognosis Prognosis for Safe Diet Advancement: Good Barriers to Reach Goals: Cognitive deficits ((?))      Swallow Study   General Date of Onset: 10/24/16 HPI: Pt is a 83 y.o. female with a known history of Arthritis, endometrial cancer, hyperlipidemia, hypertension, hypothyroidism was brought by the family members because of lethargy and confusion. Patient has been more lethargic for the last couple of days. She is independent in activities of daily living but not for the last few days. No complaints of any cough. No history of any fall and head injury. Patient was evaluated in the emergency room w/ possible pneumonia and was started on IV antibiotics. She is oriented to self and place but not completely to time. Pt is currently awake, verbally answering basic questions, although, pt appears to exhibit some Cognitive deficits when answering some questions (orientation, where she lived before [place], morning events). Also noted pt had difficulty following multi-step commands but did better w/ single-step commands.  Type of Study: Bedside Swallow Evaluation Previous Swallow Assessment: none Diet Prior to this Study: Regular;Thin liquids (eating "softer" foods at home per family report) Temperature Spikes Noted: No (wbc 6.3) Respiratory Status: Room air History of Recent Intubation: No Behavior/Cognition: Alert;Cooperative;Pleasant mood;Confused;Distractible;Requires cueing (picking at covers, menu) Oral Cavity Assessment: Within Functional Limits Oral Care Completed by SLP: Recent  completion by staff Oral Cavity - Dentition: Adequate natural dentition Vision: Functional for self-feeding Self-Feeding Abilities: Able to feed self;Needs set up Patient Positioning: Upright in bed Baseline Vocal Quality: Normal Volitional Cough: Strong Volitional Swallow: Able to elicit    Oral/Motor/Sensory Function Overall Oral Motor/Sensory Function: Within functional limits   Ice Chips Ice chips: Not tested Other Comments: already eating breakfast meal   Thin Liquid Thin Liquid: Within functional limits Presentation: Cup;Self Fed;Straw (~6+ ozs total)    Nectar Thick Nectar Thick Liquid: Not tested   Honey Thick Honey Thick Liquid: Not tested   Puree Puree: Impaired Presentation: Self Fed;Spoon (10+ trials) Oral Phase Impairments: Poor awareness of bolus (reduced - munching pattern) Oral Phase Functional Implications: Prolonged oral transit (munching) Pharyngeal Phase Impairments:  (none)   Solid   GO   Solid: Impaired (mech soft trials - 7-8) Presentation: Self Fed;Spoon Oral Phase Impairments: Poor awareness of bolus (increased munching and oral phase time) Oral Phase Functional Implications: Prolonged oral transit (munching) Pharyngeal Phase Impairments:  (none)    Functional Assessment Tool Used: clinical judgement Functional Limitations: Swallowing Swallow Current Status (K1601): At least 1 percent but less than 20 percent impaired, limited or restricted Swallow Goal Status [phone]): At least 1 percent but less than 20 percent impaired, limited or restricted Swallow Discharge Status [phone]): At least 1 percent but less than 20 percent impaired, limited or restricted    Orinda Kenner, MS, CCC-SLP Vickie Barton 10/26/2016,10:41 AM

## 2016-10-26 NOTE — Progress Notes (Signed)
PHARMACY NOTE:  ANTIMICROBIAL RENAL DOSAGE ADJUSTMENT  Current antimicrobial regimen includes a mismatch between antimicrobial dosage and estimated renal function.  As per policy approved by the Pharmacy & Therapeutics and Medical Executive Committees, the antimicrobial dosage will be adjusted accordingly.  Current antimicrobial dosage:  Levofloxacin 250mg  daily  Indication: PNA  Renal Function:  Estimated Creatinine Clearance: 49.1 mL/min (by C-G formula based on SCr of 0.78 mg/dL). []      On intermittent HD, scheduled: []      On CRRT    Antimicrobial dosage has been changed to:  levofloxacin 500mg  once then 250mg  daily  Additional comments:   Thank you for allowing pharmacy to be a part of this patient's care.  Ramond Dial, Pharm.D, BCPS Clinical Pharmacist  10/26/2016 9:55 AM

## 2016-10-26 NOTE — Progress Notes (Addendum)
Patient being discharged home with family. IV removed with cath intact. Patient doesn't have a PCP, strongly suggested patient getting one and daughter agreeable to finding a PCP and setting up follow-up appointment. Reviewed with patient and daughter the importance of keeping patient up and finding an activity for patient. Instructed on IS and using for 30 days. Reviewed meds, scripts, and last dose given. Allowed time for questions.  Gave daughter list of resources in community from [place], [place].

## 2016-10-27 LAB — T4: T4, Total: 6.1 ug/dL (ref 4.5–12.0)

## 2016-10-27 LAB — T3: T3 TOTAL: 56 ng/dL — AB (ref 71–180)

## 2016-10-29 LAB — CULTURE, BLOOD (ROUTINE X 2)
CULTURE: NO GROWTH
Culture: NO GROWTH
SPECIAL REQUESTS: ADEQUATE
Special Requests: ADEQUATE

## 2016-11-04 ENCOUNTER — Encounter: Payer: Self-pay | Admitting: Intensive Care

## 2016-11-04 ENCOUNTER — Emergency Department: Payer: Medicare Other

## 2016-11-04 ENCOUNTER — Emergency Department
Admission: EM | Admit: 2016-11-04 | Discharge: 2016-11-04 | Disposition: A | Discharge status: Other Acute Inpatient Hospital | Payer: Medicare Other | Attending: Emergency Medicine | Admitting: Emergency Medicine

## 2016-11-04 DIAGNOSIS — Z87891 Personal history of nicotine dependence: Secondary | ICD-10-CM | POA: Insufficient documentation

## 2016-11-04 DIAGNOSIS — R809 Proteinuria, unspecified: Secondary | ICD-10-CM | POA: Insufficient documentation

## 2016-11-04 DIAGNOSIS — I82432 Acute embolism and thrombosis of left popliteal vein: Secondary | ICD-10-CM | POA: Diagnosis not present

## 2016-11-04 DIAGNOSIS — Z8673 Personal history of transient ischemic attack (TIA), and cerebral infarction without residual deficits: Secondary | ICD-10-CM | POA: Diagnosis not present

## 2016-11-04 DIAGNOSIS — I10 Essential (primary) hypertension: Secondary | ICD-10-CM | POA: Insufficient documentation

## 2016-11-04 DIAGNOSIS — R7989 Other specified abnormal findings of blood chemistry: Secondary | ICD-10-CM | POA: Diagnosis not present

## 2016-11-04 DIAGNOSIS — M7989 Other specified soft tissue disorders: Secondary | ICD-10-CM | POA: Diagnosis not present

## 2016-11-04 DIAGNOSIS — J9811 Atelectasis: Secondary | ICD-10-CM | POA: Diagnosis not present

## 2016-11-04 DIAGNOSIS — I2692 Saddle embolus of pulmonary artery without acute cor pulmonale: Secondary | ICD-10-CM | POA: Diagnosis present

## 2016-11-04 DIAGNOSIS — I871 Compression of vein: Secondary | ICD-10-CM | POA: Diagnosis not present

## 2016-11-04 DIAGNOSIS — R946 Abnormal results of thyroid function studies: Secondary | ICD-10-CM | POA: Insufficient documentation

## 2016-11-04 DIAGNOSIS — I429 Cardiomyopathy, unspecified: Secondary | ICD-10-CM | POA: Diagnosis not present

## 2016-11-04 DIAGNOSIS — Z95828 Presence of other vascular implants and grafts: Secondary | ICD-10-CM | POA: Diagnosis not present

## 2016-11-04 DIAGNOSIS — R4189 Other symptoms and signs involving cognitive functions and awareness: Secondary | ICD-10-CM | POA: Diagnosis not present

## 2016-11-04 DIAGNOSIS — I517 Cardiomegaly: Secondary | ICD-10-CM | POA: Diagnosis not present

## 2016-11-04 DIAGNOSIS — G9340 Encephalopathy, unspecified: Secondary | ICD-10-CM | POA: Diagnosis not present

## 2016-11-04 DIAGNOSIS — E538 Deficiency of other specified B group vitamins: Secondary | ICD-10-CM | POA: Diagnosis not present

## 2016-11-04 DIAGNOSIS — R4182 Altered mental status, unspecified: Secondary | ICD-10-CM

## 2016-11-04 DIAGNOSIS — Z7982 Long term (current) use of aspirin: Secondary | ICD-10-CM | POA: Diagnosis not present

## 2016-11-04 DIAGNOSIS — Z7901 Long term (current) use of anticoagulants: Secondary | ICD-10-CM | POA: Diagnosis not present

## 2016-11-04 DIAGNOSIS — G4700 Insomnia, unspecified: Secondary | ICD-10-CM | POA: Diagnosis not present

## 2016-11-04 DIAGNOSIS — E039 Hypothyroidism, unspecified: Secondary | ICD-10-CM | POA: Insufficient documentation

## 2016-11-04 DIAGNOSIS — E785 Hyperlipidemia, unspecified: Secondary | ICD-10-CM | POA: Diagnosis not present

## 2016-11-04 DIAGNOSIS — R935 Abnormal findings on diagnostic imaging of other abdominal regions, including retroperitoneum: Secondary | ICD-10-CM | POA: Diagnosis not present

## 2016-11-04 DIAGNOSIS — Z86711 Personal history of pulmonary embolism: Secondary | ICD-10-CM | POA: Diagnosis not present

## 2016-11-04 DIAGNOSIS — R7881 Bacteremia: Secondary | ICD-10-CM | POA: Diagnosis not present

## 2016-11-04 DIAGNOSIS — I21A1 Myocardial infarction type 2: Secondary | ICD-10-CM | POA: Diagnosis not present

## 2016-11-04 DIAGNOSIS — R109 Unspecified abdominal pain: Secondary | ICD-10-CM | POA: Diagnosis not present

## 2016-11-04 DIAGNOSIS — I272 Pulmonary hypertension, unspecified: Secondary | ICD-10-CM | POA: Diagnosis not present

## 2016-11-04 DIAGNOSIS — I959 Hypotension, unspecified: Secondary | ICD-10-CM | POA: Diagnosis not present

## 2016-11-04 DIAGNOSIS — I82412 Acute embolism and thrombosis of left femoral vein: Secondary | ICD-10-CM | POA: Diagnosis not present

## 2016-11-04 DIAGNOSIS — Z7189 Other specified counseling: Secondary | ICD-10-CM | POA: Diagnosis not present

## 2016-11-04 DIAGNOSIS — D72829 Elevated white blood cell count, unspecified: Secondary | ICD-10-CM | POA: Insufficient documentation

## 2016-11-04 DIAGNOSIS — I638 Other cerebral infarction: Secondary | ICD-10-CM | POA: Diagnosis not present

## 2016-11-04 DIAGNOSIS — N281 Cyst of kidney, acquired: Secondary | ICD-10-CM | POA: Diagnosis not present

## 2016-11-04 DIAGNOSIS — I119 Hypertensive heart disease without heart failure: Secondary | ICD-10-CM | POA: Diagnosis not present

## 2016-11-04 DIAGNOSIS — R5381 Other malaise: Secondary | ICD-10-CM | POA: Diagnosis not present

## 2016-11-04 DIAGNOSIS — R570 Cardiogenic shock: Secondary | ICD-10-CM | POA: Diagnosis present

## 2016-11-04 DIAGNOSIS — C541 Malignant neoplasm of endometrium: Secondary | ICD-10-CM | POA: Diagnosis not present

## 2016-11-04 DIAGNOSIS — M79605 Pain in left leg: Secondary | ICD-10-CM | POA: Diagnosis not present

## 2016-11-04 DIAGNOSIS — I5021 Acute systolic (congestive) heart failure: Secondary | ICD-10-CM | POA: Diagnosis present

## 2016-11-04 DIAGNOSIS — J9601 Acute respiratory failure with hypoxia: Secondary | ICD-10-CM | POA: Diagnosis present

## 2016-11-04 DIAGNOSIS — Z90710 Acquired absence of both cervix and uterus: Secondary | ICD-10-CM | POA: Diagnosis not present

## 2016-11-04 DIAGNOSIS — I110 Hypertensive heart disease with heart failure: Secondary | ICD-10-CM | POA: Diagnosis present

## 2016-11-04 DIAGNOSIS — Z4682 Encounter for fitting and adjustment of non-vascular catheter: Secondary | ICD-10-CM | POA: Diagnosis not present

## 2016-11-04 DIAGNOSIS — I745 Embolism and thrombosis of iliac artery: Secondary | ICD-10-CM | POA: Diagnosis not present

## 2016-11-04 DIAGNOSIS — R918 Other nonspecific abnormal finding of lung field: Secondary | ICD-10-CM | POA: Diagnosis not present

## 2016-11-04 DIAGNOSIS — I639 Cerebral infarction, unspecified: Secondary | ICD-10-CM | POA: Diagnosis not present

## 2016-11-04 DIAGNOSIS — Z515 Encounter for palliative care: Secondary | ICD-10-CM | POA: Diagnosis not present

## 2016-11-04 DIAGNOSIS — Z8542 Personal history of malignant neoplasm of other parts of uterus: Secondary | ICD-10-CM | POA: Diagnosis not present

## 2016-11-04 DIAGNOSIS — R748 Abnormal levels of other serum enzymes: Secondary | ICD-10-CM | POA: Diagnosis not present

## 2016-11-04 DIAGNOSIS — R262 Difficulty in walking, not elsewhere classified: Secondary | ICD-10-CM | POA: Diagnosis not present

## 2016-11-04 DIAGNOSIS — Z7401 Bed confinement status: Secondary | ICD-10-CM | POA: Diagnosis not present

## 2016-11-04 DIAGNOSIS — K5730 Diverticulosis of large intestine without perforation or abscess without bleeding: Secondary | ICD-10-CM | POA: Diagnosis not present

## 2016-11-04 DIAGNOSIS — R531 Weakness: Secondary | ICD-10-CM | POA: Diagnosis not present

## 2016-11-04 DIAGNOSIS — R778 Other specified abnormalities of plasma proteins: Secondary | ICD-10-CM

## 2016-11-04 DIAGNOSIS — N179 Acute kidney failure, unspecified: Secondary | ICD-10-CM | POA: Diagnosis not present

## 2016-11-04 DIAGNOSIS — I2510 Atherosclerotic heart disease of native coronary artery without angina pectoris: Secondary | ICD-10-CM | POA: Diagnosis present

## 2016-11-04 DIAGNOSIS — I2729 Other secondary pulmonary hypertension: Secondary | ICD-10-CM | POA: Diagnosis present

## 2016-11-04 DIAGNOSIS — C55 Malignant neoplasm of uterus, part unspecified: Secondary | ICD-10-CM | POA: Diagnosis not present

## 2016-11-04 DIAGNOSIS — R1900 Intra-abdominal and pelvic swelling, mass and lump, unspecified site: Secondary | ICD-10-CM | POA: Diagnosis not present

## 2016-11-04 DIAGNOSIS — E539 Vitamin B deficiency, unspecified: Secondary | ICD-10-CM | POA: Diagnosis not present

## 2016-11-04 DIAGNOSIS — I2609 Other pulmonary embolism with acute cor pulmonale: Secondary | ICD-10-CM | POA: Diagnosis not present

## 2016-11-04 DIAGNOSIS — I82890 Acute embolism and thrombosis of other specified veins: Secondary | ICD-10-CM | POA: Diagnosis not present

## 2016-11-04 DIAGNOSIS — Z7409 Other reduced mobility: Secondary | ICD-10-CM | POA: Diagnosis not present

## 2016-11-04 DIAGNOSIS — I5181 Takotsubo syndrome: Secondary | ICD-10-CM | POA: Diagnosis not present

## 2016-11-04 DIAGNOSIS — C775 Secondary and unspecified malignant neoplasm of intrapelvic lymph nodes: Secondary | ICD-10-CM | POA: Diagnosis present

## 2016-11-04 DIAGNOSIS — R931 Abnormal findings on diagnostic imaging of heart and coronary circulation: Secondary | ICD-10-CM | POA: Diagnosis not present

## 2016-11-04 DIAGNOSIS — I214 Non-ST elevation (NSTEMI) myocardial infarction: Secondary | ICD-10-CM | POA: Diagnosis not present

## 2016-11-04 DIAGNOSIS — Z79899 Other long term (current) drug therapy: Secondary | ICD-10-CM | POA: Diagnosis not present

## 2016-11-04 DIAGNOSIS — I2699 Other pulmonary embolism without acute cor pulmonale: Secondary | ICD-10-CM | POA: Diagnosis not present

## 2016-11-04 DIAGNOSIS — M1990 Unspecified osteoarthritis, unspecified site: Secondary | ICD-10-CM | POA: Diagnosis present

## 2016-11-04 LAB — COMPREHENSIVE METABOLIC PANEL
ALT: 12 U/L — ABNORMAL LOW (ref 14–54)
ANION GAP: 14 (ref 5–15)
AST: 28 U/L (ref 15–41)
Albumin: 2.6 g/dL — ABNORMAL LOW (ref 3.5–5.0)
Alkaline Phosphatase: 76 U/L (ref 38–126)
BILIRUBIN TOTAL: 0.8 mg/dL (ref 0.3–1.2)
BUN: 38 mg/dL — ABNORMAL HIGH (ref 6–20)
CHLORIDE: 91 mmol/L — AB (ref 101–111)
CO2: 24 mmol/L (ref 22–32)
Calcium: 8.3 mg/dL — ABNORMAL LOW (ref 8.9–10.3)
Creatinine, Ser: 1.41 mg/dL — ABNORMAL HIGH (ref 0.44–1.00)
GFR, EST AFRICAN AMERICAN: 38 mL/min — AB (ref >60)
GFR, EST NON AFRICAN AMERICAN: 33 mL/min — AB (ref >60)
Glucose, Bld: 150 mg/dL — ABNORMAL HIGH (ref 65–99)
POTASSIUM: 3.3 mmol/L — AB (ref 3.5–5.1)
Sodium: 129 mmol/L — ABNORMAL LOW (ref 135–145)
TOTAL PROTEIN: 7.3 g/dL (ref 6.5–8.1)

## 2016-11-04 LAB — CBC WITH DIFFERENTIAL/PLATELET
BASOS ABS: 0 10*3/uL (ref 0–0.1)
Basophils Relative: 0 %
EOS PCT: 0 %
Eosinophils Absolute: 0 10*3/uL (ref 0–0.7)
HEMATOCRIT: 40.5 % (ref 35.0–47.0)
Hemoglobin: 13.6 g/dL (ref 12.0–16.0)
LYMPHS PCT: 5 %
Lymphs Abs: 0.8 10*3/uL — ABNORMAL LOW (ref 1.0–3.6)
MCH: 29.4 pg (ref 26.0–34.0)
MCHC: 33.5 g/dL (ref 32.0–36.0)
MCV: 87.6 fL (ref 80.0–100.0)
MONO ABS: 1.1 10*3/uL — AB (ref 0.2–0.9)
MONOS PCT: 6 %
Neutro Abs: 15.6 10*3/uL — ABNORMAL HIGH (ref 1.4–6.5)
Neutrophils Relative %: 89 %
PLATELETS: 239 10*3/uL (ref 150–440)
RBC: 4.62 MIL/uL (ref 3.80–5.20)
RDW: 14.2 % (ref 11.5–14.5)
WBC: 17.6 10*3/uL — ABNORMAL HIGH (ref 3.6–11.0)

## 2016-11-04 LAB — APTT: aPTT: 36 seconds (ref 24–36)

## 2016-11-04 LAB — URINALYSIS, COMPLETE (UACMP) WITH MICROSCOPIC
BILIRUBIN URINE: NEGATIVE
Bacteria, UA: NONE SEEN
Glucose, UA: NEGATIVE mg/dL
HGB URINE DIPSTICK: NEGATIVE
Ketones, ur: NEGATIVE mg/dL
LEUKOCYTES UA: NEGATIVE
Nitrite: NEGATIVE
Specific Gravity, Urine: 1.019 (ref 1.005–1.030)
pH: 5 (ref 5.0–8.0)

## 2016-11-04 LAB — LACTIC ACID, PLASMA: Lactic Acid, Venous: 2.1 mmol/L (ref 0.5–1.9)

## 2016-11-04 LAB — PROTIME-INR
INR: 1.27
PROTHROMBIN TIME: 16 s — AB (ref 11.4–15.2)

## 2016-11-04 LAB — TSH: TSH: 1.187 u[IU]/mL (ref 0.350–4.500)

## 2016-11-04 LAB — TROPONIN I: Troponin I: 0.7 ng/mL (ref <0.03)

## 2016-11-04 LAB — T4, FREE: Free T4: 2.5 ng/dL — ABNORMAL HIGH (ref 0.61–1.12)

## 2016-11-04 MED ORDER — IOPAMIDOL (ISOVUE-300) INJECTION 61%
75.0000 mL | Freq: Once | INTRAVENOUS | Status: AC | PRN
  Administered 2016-11-04: 75 mL via INTRAVENOUS

## 2016-11-04 MED ORDER — SODIUM CHLORIDE 0.9 % IV SOLN
Freq: Once | INTRAVENOUS | Status: AC
  Administered 2016-11-04: 16:00:00 via INTRAVENOUS

## 2016-11-04 MED ORDER — HEPARIN BOLUS VIA INFUSION
3900.0000 [IU] | Freq: Once | INTRAVENOUS | Status: AC
  Administered 2016-11-04: 3900 [IU] via INTRAVENOUS
  Filled 2016-11-04: qty 3900

## 2016-11-04 MED ORDER — SODIUM CHLORIDE 0.9 % IV SOLN
1000.0000 mL | Freq: Once | INTRAVENOUS | Status: AC
  Administered 2016-11-04: 1000 mL via INTRAVENOUS

## 2016-11-04 MED ORDER — HEPARIN (PORCINE) IN NACL 100-0.45 UNIT/ML-% IJ SOLN: 12.0000 [IU]/kg/h | Freq: Once | INTRAMUSCULAR | Status: DC

## 2016-11-04 MED ORDER — HEPARIN SODIUM (PORCINE) 5000 UNIT/ML IJ SOLN: 4000.0000 [IU] | Freq: Once | INTRAMUSCULAR | Status: DC

## 2016-11-04 MED ORDER — HEPARIN (PORCINE) IN NACL 100-0.45 UNIT/ML-% IJ SOLN
800.0000 [IU]/h | Freq: Once | INTRAMUSCULAR | Status: AC
  Administered 2016-11-04: 800 [IU]/h via INTRAVENOUS
  Filled 2016-11-04: qty 250

## 2016-11-04 MED ORDER — SODIUM CHLORIDE 0.9 % IV SOLN
Freq: Once | INTRAVENOUS | Status: AC
  Administered 2016-11-04: 14:00:00 via INTRAVENOUS

## 2016-11-04 NOTE — ED Triage Notes (Signed)
Patient arrived by EMS from [facility] clinic for checkup. Family at bedside reports patient is not herself because she is having trouble speaking at times, confused, and not able to answer questions at baseline. Patient oriented to self and situation only upon arrival

## 2016-11-04 NOTE — ED Notes (Signed)
Patient transported to CT 

## 2016-11-04 NOTE — ED Provider Notes (Addendum)
[facility] Emergency Department Provider Note       Time seen: ----------------------------------------- 12:10 PM on 11/04/2016 -----------------------------------------   Level V caveat: History/ROS limited by altered mental status  I have reviewed the triage vital signs and the nursing notes.   HISTORY   Chief Complaint Altered Mental Status    HPI Vickie Barton is a 83 y.o. female who presents to the ED for hypotension and hypoxia. Patient was sent from [facility] for an evaluation. Patient was being seen there for hospital follow-up regarding recent admission for hypothyroidism and pneumonia. Family reports patient's been acting herself and she is having trouble speaking at times, has been confused and weak. Patient denies complaints but arrives hypoxic and hypotensive.   Past Medical History:  Diagnosis Date  . Arthritis   . Endometrial cancer ([place]) 01/28/2016  . Hyperlipidemia   . Hypertension   . Hypothyroidism     Patient Active Problem List   Diagnosis Date Noted  . CAP (community acquired pneumonia) 10/24/2016  . Endometrial cancer ([place]) 02/19/2016    Past Surgical History:  Procedure Laterality Date  . DILATION AND CURETTAGE OF UTERUS    . HYSTEROSCOPY W/D&C N/A 01/28/2016   Procedure: DILATATION AND CURETTAGE /HYSTEROSCOPY;  Surgeon: [name], MD;  Location: [facility] OR[facility];  Service: Gynecology;  Laterality: N/A;  . LAPAROSCOPIC BILATERAL SALPINGO OOPHERECTOMY Bilateral 02/26/2016   Procedure: LAPAROSCOPIC BILATERAL SALPINGO OOPHORECTOMY;  Surgeon: [name], MD;  Location: [facility] OR[facility];  Service: Gynecology;  Laterality: Bilateral;  . LAPAROSCOPIC HYSTERECTOMY N/A 02/26/2016   Procedure: HYSTERECTOMY TOTAL LAPAROSCOPIC;  Surgeon: [name], MD;  Location: [facility] ORS;  Service: Gynecology;  Laterality: N/A;  . SENTINEL NODE BIOPSY N/A 02/26/2016   Procedure: SENTINEL NODE BIOPSY;  Surgeon: [name], MD;  Location:  [facility] ORS;  Service: Gynecology;  Laterality: N/A;  . TONSILLECTOMY      Allergies Sulfa antibiotics and Zinc  Social History Social History  Substance Use Topics  . Smoking status: Former Research scientist (life sciences)  . Smokeless tobacco: Never Used  . Alcohol use No    Review of Systems Unknown, patient denies complaints but does appear confused  All systems negative/normal/unremarkable except as stated in the HPI  ____________________________________________   PHYSICAL EXAM:  VITAL SIGNS: ED Triage Vitals  Enc Vitals Group     BP 11/04/16 1200 (!) 87/60     Pulse Rate 11/04/16 1200 85     Resp 11/04/16 1200 (!) 25     Temp --      Temp src --      SpO2 11/04/16 1200 (!) 86 %     Weight 11/04/16 1207 161 lb (73 kg)     Height 11/04/16 1207 5\' 2"  (1.575 m)     Head Circumference --      Peak Flow --      Pain Score --      Pain Loc --      Pain Edu? --      Excl. in [place]? --     Constitutional: Alert But disoriented, Well appearing and in no distress. Eyes: Conjunctivae are normal. Normal extraocular movements. ENT   Head: Normocephalic and atraumatic.   Nose: No congestion/rhinnorhea.   Mouth/Throat: Mucous membranes are moist.   Neck: No stridor. Cardiovascular: Normal rate, regular rhythm. No murmurs, rubs, or gallops. Respiratory: Normal respiratory effort without tachypnea nor retractions. Breath sounds are clear and equal bilaterally. No wheezes/rales/rhonchi. Gastrointestinal: Soft and nontender. Normal bowel sounds Musculoskeletal:  Nontender with normal range of motion in extremities. No lower extremity tenderness nor edema. Neurologic:  Normal speech and language. No gross focal neurologic deficits are appreciated.  Skin:  Skin is warm, dry and intact. No rash noted. Psychiatric: Flat affect ____________________________________________  EKG: Interpreted by me. Sinus rhythm rate 84 bpm, prolonged PR interval, possible inferior and anterior infarct age  indeterminate. Left axis deviation  ____________________________________________  ED COURSE:  Pertinent labs & imaging results that were available during my care of the patient were reviewed by me and considered in my medical decision making (see chart for details). Patient presents for weakness, we will assess with labs and imaging as indicated.   Procedures ____________________________________________   LABS (pertinent positives/negatives)  Labs Reviewed  CBC WITH DIFFERENTIAL/PLATELET - Abnormal; Notable for the following:       Result Value   WBC 17.6 (*)    Neutro Abs 15.6 (*)    Lymphs Abs 0.8 (*)    Monocytes Absolute 1.1 (*)    All other components within normal limits  COMPREHENSIVE METABOLIC PANEL - Abnormal; Notable for the following:    Sodium 129 (*)    Potassium 3.3 (*)    Chloride 91 (*)    Glucose, Bld 150 (*)    BUN 38 (*)    Creatinine, Ser 1.41 (*)    Calcium 8.3 (*)    Albumin 2.6 (*)    ALT 12 (*)    GFR calc non Af Amer 33 (*)    GFR calc Af Amer 38 (*)    All other components within normal limits  TROPONIN I - Abnormal; Notable for the following:    Troponin I 0.70 (*)    All other components within normal limits  URINALYSIS, COMPLETE (UACMP) WITH MICROSCOPIC - Abnormal; Notable for the following:    Color, Urine AMBER (*)    APPearance HAZY (*)    Protein, ur >=300 (*)    Squamous Epithelial / LPF 0-5 (*)    All other components within normal limits  T4, FREE - Abnormal; Notable for the following:    Free T4 2.50 (*)    All other components within normal limits  LACTIC ACID, PLASMA - Abnormal; Notable for the following:    Lactic Acid, Venous 2.1 (*)    All other components within normal limits  CULTURE, BLOOD (ROUTINE X 2)  CULTURE, BLOOD (ROUTINE X 2)  TSH  BLOOD GAS, VENOUS  LACTIC ACID, PLASMA   CRITICAL CARE Performed by: Earleen Newport   Total critical care time: 30 minutes  Critical care time was exclusive of  separately billable procedures and treating other patients.  Critical care was necessary to treat or prevent imminent or life-threatening deterioration.  Critical care was time spent personally by me on the following activities: development of treatment plan with patient and/or surrogate as well as nursing, discussions with consultants, evaluation of patient's response to treatment, examination of patient, obtaining history from patient or surrogate, ordering and performing treatments and interventions, ordering and review of laboratory studies, ordering and review of radiographic studies, pulse oximetry and re-evaluation of patient's condition.  RADIOLOGY Images were viewed by me  Chest x-ray IMPRESSION: Mild bibasilar subsegmental atelectasis. CT scan of the chest/abdomen and pelvis IMPRESSION: 1. Examination is positive for are large bilateral pulmonary emboli with associated right heart strain. RV to LV ratio equals 2.0. 2. Right middle lobe and posterior right lung base infarcts. 3. Interval progression of abdominal and pelvic metastatic adenopathy. 4.  Critical Value/emergent results were called by telephone at the time of interpretation on 11/04/2016 at 3:00 pm to Dr. Lenise Arena , who verbally acknowledged these results. ____________________________________________  FINAL ASSESSMENT AND PLAN  Weakness, bilateral pulmonary emboli with pulmonary infarction, Elevated troponin, hyperthyroidism, mild renal insufficiency, proteinuria, metastatic adenopathy   Plan: Patient's labs and imaging were dictated above. Patient had presented for confusion and altered mental status. She was recently admitted to the hospital for same for the possible diagnosis of hypothyroidism. Today she is actually hyperthyroid but does appear confused again. Troponin is also elevated and she appears dehydrated. I have placed her on a heparin drip due to her elevated troponin and subsequently discovered  pulmonary emboli. Remarkably she does not have any chest pain or difficulty breathing. Currently she is on 3 L of oxygen. Family has requested transferred to [facility]. Current blood pressure is markedly improved after 2 L of saline.   Earleen Newport, MD   Note: This note was generated in part or whole with voice recognition software. Voice recognition is usually quite accurate but there are transcription errors that can and very often do occur. I apologize for any typographical errors that were not detected and corrected.     Earleen Newport, MD 11/04/16 1430    Earleen Newport, MD 11/04/16 [phone]

## 2016-11-04 NOTE — ED Provider Notes (Signed)
Clinical Course as of Nov 04 1852  Wed Nov 04, 2016  1536 Patient awaiting bed assignment and transfer to [facility].  Will need reassessment prior to transfer.  [CF]  1739 Reassessed patient just as EMS was arriving for transport.  She is stable and in no acute distress.  Borderline hypotensive but consistent with prior blood pressures.  Still on heparin.  No complaints at this time.  Appropriate for transport for definitive care at receiving facility  [CF]    Clinical Course User Index [CF] Hinda Kehr, MD      Hinda Kehr, MD 11/04/16 [phone]

## 2016-11-04 NOTE — ED Notes (Signed)
Patient back from CT.

## 2016-11-04 NOTE — Progress Notes (Signed)
ANTICOAGULATION CONSULT NOTE - Initial Consult  Pharmacy Consult for heparin gtt Indication: chest pain/ACS  Allergies  Allergen Reactions  . Sulfa Antibiotics Rash  . Zinc Rash    Patient Measurements: Height: 5\' 2"  (157.5 cm) Weight: 161 lb (73 kg) IBW/kg (Calculated) : 50.1 Heparin Dosing Weight:  65.7   Vital Signs: BP: 109/60 (08/15 1400) Pulse Rate: 78 (08/15 1405)  Labs:  Recent Labs  11/04/16 1207  HGB 13.6  HCT 40.5  PLT 239  CREATININE 1.41*  TROPONINI 0.70*    Estimated Creatinine Clearance: 27.8 mL/min (A) (by C-G formula based on SCr of 1.41 mg/dL (H)).   Medical History: Past Medical History:  Diagnosis Date  . Arthritis   . Endometrial cancer ([place]) 01/28/2016  . Hyperlipidemia   . Hypertension   . Hypothyroidism     Medications:   (Not in a hospital admission) Scheduled:  . heparin  3,900 Units Intravenous Once   Infusions:  . heparin     PRN:   Assessment: 83 yo female with elevated troponin, MD would like heparin gtt per pharmacy management  Goal of Therapy:  Heparin level 0.3-0.7 units/ml Monitor platelets by anticoagulation protocol: Yes   Plan:  Give 3900 units bolus x 1 Start heparin infusion at 800 units/hr Check anti-Xa level in 8 hours and daily while on heparin Continue to monitor H&H and platelets  Donna Christen Jonmarc Bodkin, PharmD, MBA, [facility]     11/04/2016,2:40 PM

## 2016-11-04 NOTE — ED Notes (Signed)
EMTALA completed by edp and primary RN  and reviewed by this RN.

## 2016-11-04 NOTE — ED Notes (Signed)
Patient placed on 5L O2 with saturation of 92%

## 2016-11-09 LAB — CULTURE, BLOOD (ROUTINE X 2)
CULTURE: NO GROWTH
Culture: NO GROWTH
SPECIAL REQUESTS: ADEQUATE
Special Requests: ADEQUATE

## 2016-11-09 LAB — BLOOD GAS, VENOUS
Patient temperature: 37
pCO2, Ven: 44 mmHg (ref 44.0–60.0)
pH, Ven: 7.43 (ref 7.250–7.430)

## 2016-11-18 DIAGNOSIS — I119 Hypertensive heart disease without heart failure: Secondary | ICD-10-CM | POA: Diagnosis not present

## 2016-11-18 DIAGNOSIS — I429 Cardiomyopathy, unspecified: Secondary | ICD-10-CM | POA: Diagnosis not present

## 2016-11-18 DIAGNOSIS — R52 Pain, unspecified: Secondary | ICD-10-CM | POA: Diagnosis not present

## 2016-11-18 DIAGNOSIS — I509 Heart failure, unspecified: Secondary | ICD-10-CM | POA: Diagnosis not present

## 2016-11-18 DIAGNOSIS — I871 Compression of vein: Secondary | ICD-10-CM | POA: Diagnosis not present

## 2016-11-18 DIAGNOSIS — I214 Non-ST elevation (NSTEMI) myocardial infarction: Secondary | ICD-10-CM | POA: Diagnosis not present

## 2016-11-18 DIAGNOSIS — I82412 Acute embolism and thrombosis of left femoral vein: Secondary | ICD-10-CM | POA: Diagnosis not present

## 2016-11-18 DIAGNOSIS — Z8673 Personal history of transient ischemic attack (TIA), and cerebral infarction without residual deficits: Secondary | ICD-10-CM | POA: Diagnosis not present

## 2016-11-18 DIAGNOSIS — I6340 Cerebral infarction due to embolism of unspecified cerebral artery: Secondary | ICD-10-CM | POA: Diagnosis not present

## 2016-11-18 DIAGNOSIS — E785 Hyperlipidemia, unspecified: Secondary | ICD-10-CM | POA: Diagnosis not present

## 2016-11-18 DIAGNOSIS — Z7901 Long term (current) use of anticoagulants: Secondary | ICD-10-CM | POA: Diagnosis not present

## 2016-11-18 DIAGNOSIS — E539 Vitamin B deficiency, unspecified: Secondary | ICD-10-CM | POA: Diagnosis not present

## 2016-11-18 DIAGNOSIS — E639 Nutritional deficiency, unspecified: Secondary | ICD-10-CM | POA: Diagnosis not present

## 2016-11-18 DIAGNOSIS — Z95828 Presence of other vascular implants and grafts: Secondary | ICD-10-CM | POA: Diagnosis not present

## 2016-11-18 DIAGNOSIS — R1900 Intra-abdominal and pelvic swelling, mass and lump, unspecified site: Secondary | ICD-10-CM | POA: Diagnosis not present

## 2016-11-18 DIAGNOSIS — I2699 Other pulmonary embolism without acute cor pulmonale: Secondary | ICD-10-CM | POA: Diagnosis not present

## 2016-11-18 DIAGNOSIS — Z79899 Other long term (current) drug therapy: Secondary | ICD-10-CM | POA: Diagnosis not present

## 2016-11-18 DIAGNOSIS — G4700 Insomnia, unspecified: Secondary | ICD-10-CM | POA: Diagnosis not present

## 2016-11-18 DIAGNOSIS — C548 Malignant neoplasm of overlapping sites of corpus uteri: Secondary | ICD-10-CM | POA: Diagnosis not present

## 2016-11-18 DIAGNOSIS — M1991 Primary osteoarthritis, unspecified site: Secondary | ICD-10-CM | POA: Diagnosis not present

## 2016-11-18 DIAGNOSIS — R609 Edema, unspecified: Secondary | ICD-10-CM | POA: Diagnosis not present

## 2016-11-18 DIAGNOSIS — I82890 Acute embolism and thrombosis of other specified veins: Secondary | ICD-10-CM | POA: Diagnosis not present

## 2016-11-18 DIAGNOSIS — I82432 Acute embolism and thrombosis of left popliteal vein: Secondary | ICD-10-CM | POA: Diagnosis not present

## 2016-11-18 DIAGNOSIS — I82409 Acute embolism and thrombosis of unspecified deep veins of unspecified lower extremity: Secondary | ICD-10-CM | POA: Diagnosis not present

## 2016-11-18 DIAGNOSIS — Z7401 Bed confinement status: Secondary | ICD-10-CM | POA: Diagnosis not present

## 2016-11-18 DIAGNOSIS — E039 Hypothyroidism, unspecified: Secondary | ICD-10-CM | POA: Diagnosis not present

## 2016-11-18 DIAGNOSIS — M1990 Unspecified osteoarthritis, unspecified site: Secondary | ICD-10-CM | POA: Diagnosis not present

## 2016-11-18 DIAGNOSIS — C55 Malignant neoplasm of uterus, part unspecified: Secondary | ICD-10-CM | POA: Diagnosis not present

## 2016-11-18 DIAGNOSIS — C541 Malignant neoplasm of endometrium: Secondary | ICD-10-CM | POA: Diagnosis not present

## 2016-11-18 DIAGNOSIS — I110 Hypertensive heart disease with heart failure: Secondary | ICD-10-CM | POA: Diagnosis not present

## 2016-11-18 DIAGNOSIS — E8809 Other disorders of plasma-protein metabolism, not elsewhere classified: Secondary | ICD-10-CM | POA: Diagnosis not present

## 2016-11-19 DIAGNOSIS — I6340 Cerebral infarction due to embolism of unspecified cerebral artery: Secondary | ICD-10-CM | POA: Diagnosis not present

## 2016-11-19 DIAGNOSIS — I2699 Other pulmonary embolism without acute cor pulmonale: Secondary | ICD-10-CM | POA: Diagnosis not present

## 2016-11-19 DIAGNOSIS — C548 Malignant neoplasm of overlapping sites of corpus uteri: Secondary | ICD-10-CM | POA: Diagnosis not present

## 2016-11-19 DIAGNOSIS — E8809 Other disorders of plasma-protein metabolism, not elsewhere classified: Secondary | ICD-10-CM | POA: Diagnosis not present

## 2016-11-25 DIAGNOSIS — R52 Pain, unspecified: Secondary | ICD-10-CM | POA: Diagnosis not present

## 2016-11-25 DIAGNOSIS — I509 Heart failure, unspecified: Secondary | ICD-10-CM | POA: Diagnosis not present

## 2016-11-25 DIAGNOSIS — I82409 Acute embolism and thrombosis of unspecified deep veins of unspecified lower extremity: Secondary | ICD-10-CM | POA: Diagnosis not present

## 2016-11-25 DIAGNOSIS — I2699 Other pulmonary embolism without acute cor pulmonale: Secondary | ICD-10-CM | POA: Diagnosis not present

## 2016-11-30 DIAGNOSIS — I509 Heart failure, unspecified: Secondary | ICD-10-CM | POA: Diagnosis not present

## 2016-11-30 DIAGNOSIS — R52 Pain, unspecified: Secondary | ICD-10-CM | POA: Diagnosis not present

## 2016-11-30 DIAGNOSIS — I82409 Acute embolism and thrombosis of unspecified deep veins of unspecified lower extremity: Secondary | ICD-10-CM | POA: Diagnosis not present

## 2016-11-30 DIAGNOSIS — I2699 Other pulmonary embolism without acute cor pulmonale: Secondary | ICD-10-CM | POA: Diagnosis not present

## 2016-12-02 DIAGNOSIS — G4700 Insomnia, unspecified: Secondary | ICD-10-CM | POA: Diagnosis not present

## 2016-12-02 DIAGNOSIS — R52 Pain, unspecified: Secondary | ICD-10-CM | POA: Diagnosis not present

## 2016-12-02 DIAGNOSIS — I2699 Other pulmonary embolism without acute cor pulmonale: Secondary | ICD-10-CM | POA: Diagnosis not present

## 2016-12-02 DIAGNOSIS — I82409 Acute embolism and thrombosis of unspecified deep veins of unspecified lower extremity: Secondary | ICD-10-CM | POA: Diagnosis not present

## 2016-12-03 DIAGNOSIS — I82432 Acute embolism and thrombosis of left popliteal vein: Secondary | ICD-10-CM | POA: Diagnosis not present

## 2016-12-03 DIAGNOSIS — I2699 Other pulmonary embolism without acute cor pulmonale: Secondary | ICD-10-CM | POA: Diagnosis not present

## 2016-12-03 DIAGNOSIS — I429 Cardiomyopathy, unspecified: Secondary | ICD-10-CM | POA: Diagnosis not present

## 2016-12-03 DIAGNOSIS — I82412 Acute embolism and thrombosis of left femoral vein: Secondary | ICD-10-CM | POA: Diagnosis not present

## 2016-12-03 DIAGNOSIS — I110 Hypertensive heart disease with heart failure: Secondary | ICD-10-CM | POA: Diagnosis not present

## 2016-12-03 DIAGNOSIS — I509 Heart failure, unspecified: Secondary | ICD-10-CM | POA: Diagnosis not present

## 2016-12-04 DIAGNOSIS — I82432 Acute embolism and thrombosis of left popliteal vein: Secondary | ICD-10-CM | POA: Diagnosis not present

## 2016-12-04 DIAGNOSIS — I429 Cardiomyopathy, unspecified: Secondary | ICD-10-CM | POA: Diagnosis not present

## 2016-12-04 DIAGNOSIS — I509 Heart failure, unspecified: Secondary | ICD-10-CM | POA: Diagnosis not present

## 2016-12-04 DIAGNOSIS — I82412 Acute embolism and thrombosis of left femoral vein: Secondary | ICD-10-CM | POA: Diagnosis not present

## 2016-12-04 DIAGNOSIS — I110 Hypertensive heart disease with heart failure: Secondary | ICD-10-CM | POA: Diagnosis not present

## 2016-12-04 DIAGNOSIS — I2699 Other pulmonary embolism without acute cor pulmonale: Secondary | ICD-10-CM | POA: Diagnosis not present

## 2016-12-07 DIAGNOSIS — I82432 Acute embolism and thrombosis of left popliteal vein: Secondary | ICD-10-CM | POA: Diagnosis not present

## 2016-12-07 DIAGNOSIS — I82412 Acute embolism and thrombosis of left femoral vein: Secondary | ICD-10-CM | POA: Diagnosis not present

## 2016-12-07 DIAGNOSIS — I110 Hypertensive heart disease with heart failure: Secondary | ICD-10-CM | POA: Diagnosis not present

## 2016-12-07 DIAGNOSIS — I429 Cardiomyopathy, unspecified: Secondary | ICD-10-CM | POA: Diagnosis not present

## 2016-12-07 DIAGNOSIS — I509 Heart failure, unspecified: Secondary | ICD-10-CM | POA: Diagnosis not present

## 2016-12-07 DIAGNOSIS — I2699 Other pulmonary embolism without acute cor pulmonale: Secondary | ICD-10-CM | POA: Diagnosis not present

## 2016-12-08 DIAGNOSIS — I2699 Other pulmonary embolism without acute cor pulmonale: Secondary | ICD-10-CM | POA: Diagnosis not present

## 2016-12-08 DIAGNOSIS — I110 Hypertensive heart disease with heart failure: Secondary | ICD-10-CM | POA: Diagnosis not present

## 2016-12-08 DIAGNOSIS — I82432 Acute embolism and thrombosis of left popliteal vein: Secondary | ICD-10-CM | POA: Diagnosis not present

## 2016-12-08 DIAGNOSIS — I429 Cardiomyopathy, unspecified: Secondary | ICD-10-CM | POA: Diagnosis not present

## 2016-12-08 DIAGNOSIS — I82412 Acute embolism and thrombosis of left femoral vein: Secondary | ICD-10-CM | POA: Diagnosis not present

## 2016-12-08 DIAGNOSIS — I509 Heart failure, unspecified: Secondary | ICD-10-CM | POA: Diagnosis not present

## 2016-12-09 DIAGNOSIS — I890 Lymphedema, not elsewhere classified: Secondary | ICD-10-CM | POA: Diagnosis not present

## 2016-12-09 DIAGNOSIS — E039 Hypothyroidism, unspecified: Secondary | ICD-10-CM | POA: Diagnosis not present

## 2016-12-09 DIAGNOSIS — I824Z2 Acute embolism and thrombosis of unspecified deep veins of left distal lower extremity: Secondary | ICD-10-CM | POA: Diagnosis not present

## 2016-12-10 DIAGNOSIS — I82412 Acute embolism and thrombosis of left femoral vein: Secondary | ICD-10-CM | POA: Diagnosis not present

## 2016-12-10 DIAGNOSIS — I509 Heart failure, unspecified: Secondary | ICD-10-CM | POA: Diagnosis not present

## 2016-12-10 DIAGNOSIS — I82432 Acute embolism and thrombosis of left popliteal vein: Secondary | ICD-10-CM | POA: Diagnosis not present

## 2016-12-10 DIAGNOSIS — I2699 Other pulmonary embolism without acute cor pulmonale: Secondary | ICD-10-CM | POA: Diagnosis not present

## 2016-12-10 DIAGNOSIS — I110 Hypertensive heart disease with heart failure: Secondary | ICD-10-CM | POA: Diagnosis not present

## 2016-12-10 DIAGNOSIS — I429 Cardiomyopathy, unspecified: Secondary | ICD-10-CM | POA: Diagnosis not present

## 2016-12-11 DIAGNOSIS — I2699 Other pulmonary embolism without acute cor pulmonale: Secondary | ICD-10-CM | POA: Diagnosis not present

## 2016-12-11 DIAGNOSIS — I110 Hypertensive heart disease with heart failure: Secondary | ICD-10-CM | POA: Diagnosis not present

## 2016-12-11 DIAGNOSIS — I429 Cardiomyopathy, unspecified: Secondary | ICD-10-CM | POA: Diagnosis not present

## 2016-12-11 DIAGNOSIS — I509 Heart failure, unspecified: Secondary | ICD-10-CM | POA: Diagnosis not present

## 2016-12-11 DIAGNOSIS — I82432 Acute embolism and thrombosis of left popliteal vein: Secondary | ICD-10-CM | POA: Diagnosis not present

## 2016-12-11 DIAGNOSIS — I82412 Acute embolism and thrombosis of left femoral vein: Secondary | ICD-10-CM | POA: Diagnosis not present

## 2016-12-12 DIAGNOSIS — I82412 Acute embolism and thrombosis of left femoral vein: Secondary | ICD-10-CM | POA: Diagnosis not present

## 2016-12-12 DIAGNOSIS — I2699 Other pulmonary embolism without acute cor pulmonale: Secondary | ICD-10-CM | POA: Diagnosis not present

## 2016-12-12 DIAGNOSIS — I429 Cardiomyopathy, unspecified: Secondary | ICD-10-CM | POA: Diagnosis not present

## 2016-12-12 DIAGNOSIS — I110 Hypertensive heart disease with heart failure: Secondary | ICD-10-CM | POA: Diagnosis not present

## 2016-12-12 DIAGNOSIS — I509 Heart failure, unspecified: Secondary | ICD-10-CM | POA: Diagnosis not present

## 2016-12-12 DIAGNOSIS — I82432 Acute embolism and thrombosis of left popliteal vein: Secondary | ICD-10-CM | POA: Diagnosis not present

## 2016-12-14 DIAGNOSIS — I110 Hypertensive heart disease with heart failure: Secondary | ICD-10-CM | POA: Diagnosis not present

## 2016-12-14 DIAGNOSIS — I2699 Other pulmonary embolism without acute cor pulmonale: Secondary | ICD-10-CM | POA: Diagnosis not present

## 2016-12-14 DIAGNOSIS — I509 Heart failure, unspecified: Secondary | ICD-10-CM | POA: Diagnosis not present

## 2016-12-14 DIAGNOSIS — I82412 Acute embolism and thrombosis of left femoral vein: Secondary | ICD-10-CM | POA: Diagnosis not present

## 2016-12-14 DIAGNOSIS — I82432 Acute embolism and thrombosis of left popliteal vein: Secondary | ICD-10-CM | POA: Diagnosis not present

## 2016-12-14 DIAGNOSIS — I429 Cardiomyopathy, unspecified: Secondary | ICD-10-CM | POA: Diagnosis not present

## 2016-12-15 DIAGNOSIS — I2699 Other pulmonary embolism without acute cor pulmonale: Secondary | ICD-10-CM | POA: Diagnosis not present

## 2016-12-15 DIAGNOSIS — I82412 Acute embolism and thrombosis of left femoral vein: Secondary | ICD-10-CM | POA: Diagnosis not present

## 2016-12-15 DIAGNOSIS — I509 Heart failure, unspecified: Secondary | ICD-10-CM | POA: Diagnosis not present

## 2016-12-15 DIAGNOSIS — I429 Cardiomyopathy, unspecified: Secondary | ICD-10-CM | POA: Diagnosis not present

## 2016-12-15 DIAGNOSIS — I82432 Acute embolism and thrombosis of left popliteal vein: Secondary | ICD-10-CM | POA: Diagnosis not present

## 2016-12-15 DIAGNOSIS — I110 Hypertensive heart disease with heart failure: Secondary | ICD-10-CM | POA: Diagnosis not present

## 2016-12-16 DIAGNOSIS — I509 Heart failure, unspecified: Secondary | ICD-10-CM | POA: Diagnosis not present

## 2016-12-16 DIAGNOSIS — I2699 Other pulmonary embolism without acute cor pulmonale: Secondary | ICD-10-CM | POA: Diagnosis not present

## 2016-12-16 DIAGNOSIS — I82412 Acute embolism and thrombosis of left femoral vein: Secondary | ICD-10-CM | POA: Diagnosis not present

## 2016-12-16 DIAGNOSIS — I429 Cardiomyopathy, unspecified: Secondary | ICD-10-CM | POA: Diagnosis not present

## 2016-12-16 DIAGNOSIS — I82432 Acute embolism and thrombosis of left popliteal vein: Secondary | ICD-10-CM | POA: Diagnosis not present

## 2016-12-16 DIAGNOSIS — I110 Hypertensive heart disease with heart failure: Secondary | ICD-10-CM | POA: Diagnosis not present

## 2016-12-18 DIAGNOSIS — I2699 Other pulmonary embolism without acute cor pulmonale: Secondary | ICD-10-CM | POA: Diagnosis not present

## 2016-12-18 DIAGNOSIS — I110 Hypertensive heart disease with heart failure: Secondary | ICD-10-CM | POA: Diagnosis not present

## 2016-12-18 DIAGNOSIS — I509 Heart failure, unspecified: Secondary | ICD-10-CM | POA: Diagnosis not present

## 2016-12-18 DIAGNOSIS — I82412 Acute embolism and thrombosis of left femoral vein: Secondary | ICD-10-CM | POA: Diagnosis not present

## 2016-12-18 DIAGNOSIS — I429 Cardiomyopathy, unspecified: Secondary | ICD-10-CM | POA: Diagnosis not present

## 2016-12-18 DIAGNOSIS — I82432 Acute embolism and thrombosis of left popliteal vein: Secondary | ICD-10-CM | POA: Diagnosis not present

## 2016-12-22 DIAGNOSIS — I82412 Acute embolism and thrombosis of left femoral vein: Secondary | ICD-10-CM | POA: Diagnosis not present

## 2016-12-22 DIAGNOSIS — I10 Essential (primary) hypertension: Secondary | ICD-10-CM | POA: Diagnosis not present

## 2016-12-22 DIAGNOSIS — I429 Cardiomyopathy, unspecified: Secondary | ICD-10-CM | POA: Diagnosis not present

## 2016-12-22 DIAGNOSIS — I82432 Acute embolism and thrombosis of left popliteal vein: Secondary | ICD-10-CM | POA: Diagnosis not present

## 2016-12-22 DIAGNOSIS — I2699 Other pulmonary embolism without acute cor pulmonale: Secondary | ICD-10-CM | POA: Diagnosis not present

## 2016-12-22 DIAGNOSIS — I110 Hypertensive heart disease with heart failure: Secondary | ICD-10-CM | POA: Diagnosis not present

## 2016-12-22 DIAGNOSIS — I509 Heart failure, unspecified: Secondary | ICD-10-CM | POA: Diagnosis not present

## 2016-12-24 DIAGNOSIS — I110 Hypertensive heart disease with heart failure: Secondary | ICD-10-CM | POA: Diagnosis not present

## 2016-12-24 DIAGNOSIS — I509 Heart failure, unspecified: Secondary | ICD-10-CM | POA: Diagnosis not present

## 2016-12-24 DIAGNOSIS — I2699 Other pulmonary embolism without acute cor pulmonale: Secondary | ICD-10-CM | POA: Diagnosis not present

## 2016-12-24 DIAGNOSIS — I82412 Acute embolism and thrombosis of left femoral vein: Secondary | ICD-10-CM | POA: Diagnosis not present

## 2016-12-24 DIAGNOSIS — I82432 Acute embolism and thrombosis of left popliteal vein: Secondary | ICD-10-CM | POA: Diagnosis not present

## 2016-12-24 DIAGNOSIS — I429 Cardiomyopathy, unspecified: Secondary | ICD-10-CM | POA: Diagnosis not present

## 2016-12-25 ENCOUNTER — Emergency Department: Payer: Medicare Other

## 2016-12-25 ENCOUNTER — Encounter: Payer: Self-pay | Admitting: Emergency Medicine

## 2016-12-25 ENCOUNTER — Emergency Department
Admission: EM | Admit: 2016-12-25 | Discharge: 2016-12-26 | Disposition: A | Discharge status: Home / Self Care | Payer: Medicare Other | Attending: Emergency Medicine | Admitting: Emergency Medicine

## 2016-12-25 DIAGNOSIS — N135 Crossing vessel and stricture of ureter without hydronephrosis: Secondary | ICD-10-CM | POA: Diagnosis not present

## 2016-12-25 DIAGNOSIS — N131 Hydronephrosis with ureteral stricture, not elsewhere classified: Secondary | ICD-10-CM | POA: Insufficient documentation

## 2016-12-25 DIAGNOSIS — I10 Essential (primary) hypertension: Secondary | ICD-10-CM | POA: Diagnosis not present

## 2016-12-25 DIAGNOSIS — R109 Unspecified abdominal pain: Secondary | ICD-10-CM | POA: Diagnosis not present

## 2016-12-25 DIAGNOSIS — Z7901 Long term (current) use of anticoagulants: Secondary | ICD-10-CM | POA: Insufficient documentation

## 2016-12-25 DIAGNOSIS — E039 Hypothyroidism, unspecified: Secondary | ICD-10-CM | POA: Diagnosis not present

## 2016-12-25 DIAGNOSIS — Z87891 Personal history of nicotine dependence: Secondary | ICD-10-CM | POA: Insufficient documentation

## 2016-12-25 DIAGNOSIS — R1032 Left lower quadrant pain: Secondary | ICD-10-CM | POA: Diagnosis present

## 2016-12-25 DIAGNOSIS — I429 Cardiomyopathy, unspecified: Secondary | ICD-10-CM | POA: Diagnosis not present

## 2016-12-25 DIAGNOSIS — R1909 Other intra-abdominal and pelvic swelling, mass and lump: Secondary | ICD-10-CM | POA: Diagnosis not present

## 2016-12-25 DIAGNOSIS — I2699 Other pulmonary embolism without acute cor pulmonale: Secondary | ICD-10-CM | POA: Diagnosis not present

## 2016-12-25 DIAGNOSIS — R197 Diarrhea, unspecified: Secondary | ICD-10-CM | POA: Diagnosis not present

## 2016-12-25 DIAGNOSIS — R1900 Intra-abdominal and pelvic swelling, mass and lump, unspecified site: Secondary | ICD-10-CM | POA: Insufficient documentation

## 2016-12-25 DIAGNOSIS — I82412 Acute embolism and thrombosis of left femoral vein: Secondary | ICD-10-CM | POA: Diagnosis not present

## 2016-12-25 DIAGNOSIS — I110 Hypertensive heart disease with heart failure: Secondary | ICD-10-CM | POA: Diagnosis not present

## 2016-12-25 DIAGNOSIS — I509 Heart failure, unspecified: Secondary | ICD-10-CM | POA: Diagnosis not present

## 2016-12-25 DIAGNOSIS — I82432 Acute embolism and thrombosis of left popliteal vein: Secondary | ICD-10-CM | POA: Diagnosis not present

## 2016-12-25 DIAGNOSIS — Z79899 Other long term (current) drug therapy: Secondary | ICD-10-CM | POA: Insufficient documentation

## 2016-12-25 HISTORY — DX: Acute embolism and thrombosis of unspecified deep veins of unspecified lower extremity: I82.409

## 2016-12-25 HISTORY — DX: Heart failure, unspecified: I50.9

## 2016-12-25 HISTORY — DX: Constipation, unspecified: K59.00

## 2016-12-25 LAB — GASTROINTESTINAL PANEL BY PCR, STOOL (REPLACES STOOL CULTURE)

## 2016-12-25 LAB — C DIFFICILE QUICK SCREEN W PCR REFLEX
C DIFFICILE (CDIFF) INTERP: NOT DETECTED
C Diff antigen: NEGATIVE
C Diff toxin: NEGATIVE

## 2016-12-25 LAB — URINALYSIS, COMPLETE (UACMP) WITH MICROSCOPIC
Bilirubin Urine: NEGATIVE
GLUCOSE, UA: NEGATIVE mg/dL
Ketones, ur: NEGATIVE mg/dL
Leukocytes, UA: NEGATIVE
Nitrite: NEGATIVE
PH: 7 (ref 5.0–8.0)
Protein, ur: 100 mg/dL — AB
SQUAMOUS EPITHELIAL / LPF: NONE SEEN
Specific Gravity, Urine: 1.006 (ref 1.005–1.030)

## 2016-12-25 LAB — COMPREHENSIVE METABOLIC PANEL
ALBUMIN: 2.3 g/dL — AB (ref 3.5–5.0)
ALK PHOS: 96 U/L (ref 38–126)
ALT: 10 U/L — AB (ref 14–54)
AST: 24 U/L (ref 15–41)
Anion gap: 13 (ref 5–15)
BUN: 17 mg/dL (ref 6–20)
CALCIUM: 8.3 mg/dL — AB (ref 8.9–10.3)
CHLORIDE: 91 mmol/L — AB (ref 101–111)
CO2: 27 mmol/L (ref 22–32)
CREATININE: 1.25 mg/dL — AB (ref 0.44–1.00)
GFR calc non Af Amer: 38 mL/min — ABNORMAL LOW (ref >60)
GFR, EST AFRICAN AMERICAN: 44 mL/min — AB (ref >60)
Glucose, Bld: 111 mg/dL — ABNORMAL HIGH (ref 65–99)
Potassium: 3.9 mmol/L (ref 3.5–5.1)
SODIUM: 131 mmol/L — AB (ref 135–145)
Total Bilirubin: 0.7 mg/dL (ref 0.3–1.2)
Total Protein: 7.8 g/dL (ref 6.5–8.1)

## 2016-12-25 LAB — LIPASE, BLOOD: LIPASE: 17 U/L (ref 11–51)

## 2016-12-25 LAB — CBC
HCT: 38.3 % (ref 35.0–47.0)
Hemoglobin: 12.8 g/dL (ref 12.0–16.0)
MCH: 30.1 pg (ref 26.0–34.0)
MCHC: 33.5 g/dL (ref 32.0–36.0)
MCV: 89.9 fL (ref 80.0–100.0)
PLATELETS: 564 10*3/uL — AB (ref 150–440)
RBC: 4.26 MIL/uL (ref 3.80–5.20)
RDW: 17.2 % — AB (ref 11.5–14.5)
WBC: 13.2 10*3/uL — AB (ref 3.6–11.0)

## 2016-12-25 MED ORDER — IOPAMIDOL (ISOVUE-300) INJECTION 61%
30.0000 mL | Freq: Once | INTRAVENOUS | Status: AC
  Administered 2016-12-25: 30 mL via ORAL

## 2016-12-25 MED ORDER — IOPAMIDOL (ISOVUE-300) INJECTION 61%
75.0000 mL | Freq: Once | INTRAVENOUS | Status: AC | PRN
  Administered 2016-12-25: 75 mL via INTRAVENOUS

## 2016-12-25 MED ORDER — SODIUM CHLORIDE 0.9 % IV BOLUS (SEPSIS)
1000.0000 mL | Freq: Once | INTRAVENOUS | Status: AC
  Administered 2016-12-25: 1000 mL via INTRAVENOUS

## 2016-12-25 NOTE — ED Notes (Signed)
Patient up to toilet.  

## 2016-12-25 NOTE — ED Provider Notes (Signed)
[facility] Emergency Department Provider Note  ____________________________________________   First MD Initiated Contact with Patient 12/25/16 1902     (approximate)  I have reviewed the triage vital signs and the nursing notes.   HISTORY  Chief Complaint Abdominal Pain   HPI Vickie Barton is a 83 y.o. female with a history of DVT as well as PE diagnosed one month ago on eliquis who is presenting to the emergency department with left lower quadrant abdominal pain over the past 4 days. She says that she has had up to 20 episodes of diarrhea per day. Denies any blood in her stool. Says that she is also had several episodes of dry heaving. Says that the pain to her abdomen as to the left lower quadrant and has been sharp and severe. However, she had multiple episodes of diarrhea in the emergency department and now is pain-free. Denies any fever. Has had recent antibiotics for the past month and a half.   Past Medical History:  Diagnosis Date  . Arthritis   . Constipation   . DVT (deep venous thrombosis) ([place])   . Endometrial cancer ([place]) 01/28/2016  . Hyperlipidemia   . Hypertension   . Hypothyroidism     Patient Active Problem List   Diagnosis Date Noted  . CAP (community acquired pneumonia) 10/24/2016  . Endometrial cancer ([place]) 02/19/2016    Past Surgical History:  Procedure Laterality Date  . DILATION AND CURETTAGE OF UTERUS    . HYSTEROSCOPY W/D&C N/A 01/28/2016   Procedure: DILATATION AND CURETTAGE /HYSTEROSCOPY;  Surgeon: [name], MD;  Location: [facility] ORS;  Service: Gynecology;  Laterality: N/A;  . LAPAROSCOPIC BILATERAL SALPINGO OOPHERECTOMY Bilateral 02/26/2016   Procedure: LAPAROSCOPIC BILATERAL SALPINGO OOPHORECTOMY;  Surgeon: [name], MD;  Location: [facility] ORS;  Service: Gynecology;  Laterality: Bilateral;  . LAPAROSCOPIC HYSTERECTOMY N/A 02/26/2016   Procedure: HYSTERECTOMY TOTAL LAPAROSCOPIC;  Surgeon: [name], MD;   Location: [facility] ORS;  Service: Gynecology;  Laterality: N/A;  . SENTINEL NODE BIOPSY N/A 02/26/2016   Procedure: SENTINEL NODE BIOPSY;  Surgeon: [name], MD;  Location: [facility] ORS;  Service: Gynecology;  Laterality: N/A;  . TONSILLECTOMY      Prior to Admission medications   Medication Sig Start Date End Date Taking? Authorizing Provider  [facility] MELATONIN 3 MG TABS TAKE 1 TABLET BY MOUTH AT BEDTIME FOR INSOMNIA 12/05/16  Yes [provider]  CVS VITAMIN B-12 500 MCG tablet Take 1,000 mcg by mouth daily. 12/05/16  Yes [provider]  ELIQUIS 5 MG TABS tablet Take 5 mg by mouth 2 (two) times daily. 12/05/16  Yes [provider]  folic acid (FOLVITE) 1 MG tablet Take 1 mg by mouth daily. 12/05/16  Yes [provider]  furosemide (LASIX) 20 MG tablet Take 20 mg by mouth 2 (two) times daily. 12/05/16  Yes [provider]  KLOR-CON 10 10 MEQ tablet Take 10 mEq by mouth daily. with food 12/05/16  Yes [provider]  levothyroxine (SYNTHROID, LEVOTHROID) 100 MCG tablet Take 100 mcg by mouth daily before breakfast.   Yes [provider]  lidocaine (LIDODERM) 5 % APPLY TO PAINFUL AREAS ONCE A DAY FOR 12 HOURS ON AND 12 HOURS OFF 12/05/16  Yes [provider]  nystatin cream (MYCOSTATIN) APPLY TO AFFECTED AREA TWICE A DAY 12/05/16  Yes [provider]  oxyCODONE (OXY IR/ROXICODONE) 5 MG immediate release tablet Take 5 mg by mouth every 4 (four) hours as  needed. 12/17/16  Yes [provider]  senna (SENOKOT) 8.6 MG tablet Take 2 tablets by mouth 2 (two) times daily.   Yes [provider]  acetaminophen (TYLENOL) 325 MG tablet Take 650 mg by mouth every 6 (six) hours as needed for moderate pain.    [provider]  levofloxacin (LEVAQUIN) 250 MG tablet Take 1 tablet (250 mg total) by mouth daily at 6 PM. Patient not taking: Reported on 12/25/2016 10/27/16   Demetrios Loll, MD    Allergies Sulfa antibiotics and  Zinc  Family History  Problem Relation Age of Onset  . Diabetes Father   . Heart disease Father   . Hypertension Father   . Diabetes Paternal Grandmother   . Heart disease Mother   . Hypertension Mother   . Stroke Mother   . Hypertension Sister   . Thyroid disease Sister   . Hypertension Brother     Social History Social History  Substance Use Topics  . Smoking status: Former Research scientist (life sciences)  . Smokeless tobacco: Never Used  . Alcohol use No    Review of Systems  Constitutional: No fever/chills Eyes: No visual changes. ENT: No sore throat. Cardiovascular: Denies chest pain. Respiratory: Denies shortness of breath. Gastrointestinal:  No constipation. Genitourinary: Negative for dysuria. Musculoskeletal: Negative for back pain. Skin: Negative for rash. Neurological: Negative for headaches, focal weakness or numbness.   ____________________________________________   PHYSICAL EXAM:  VITAL SIGNS: ED Triage Vitals  Enc Vitals Group     BP 12/25/16 1609 121/89     Pulse Rate 12/25/16 1609 95     Resp 12/25/16 1609 13     Temp 12/25/16 1609 98.2 F (36.8 C)     Temp Source 12/25/16 1609 Oral     SpO2 12/25/16 1609 95 %     Weight 12/25/16 1601 140 lb (63.5 kg)     Height 12/25/16 1601 5\' 1"  (1.549 m)     Head Circumference --      Peak Flow --      Pain Score 12/25/16 1600 10     Pain Loc --      Pain Edu? --      Excl. in [place]? --     Constitutional: Alert and oriented. Well appearing and in no acute distress. Eyes: Conjunctivae are normal.  Head: Atraumatic. Nose: No congestion/rhinnorhea. Mouth/Throat: Mucous membranes are moist.  Neck: No stridor.   Cardiovascular: Normal rate, regular rhythm. Grossly normal heart sounds.   Respiratory: Normal respiratory effort.  No retractions. Lungs CTAB. Gastrointestinal: Soft with moderate suprapubic as well as left lower quadrant tenderness to palpation without rebound or guarding. No distention. No CVA  tenderness. Musculoskeletal: No lower extremity tenderness nor edema.  No joint effusions. Neurologic:  Normal speech and language. No gross focal neurologic deficits are appreciated. Skin:  Skin is warm, dry and intact. No rash noted. Psychiatric: Mood and affect are normal. Speech and behavior are normal.  ____________________________________________   LABS (all labs ordered are listed, but only abnormal results are displayed)  Labs Reviewed  COMPREHENSIVE METABOLIC PANEL - Abnormal; Notable for the following:       Result Value   Sodium 131 (*)    Chloride 91 (*)    Glucose, Bld 111 (*)    Creatinine, Ser 1.25 (*)    Calcium 8.3 (*)    Albumin 2.3 (*)    ALT 10 (*)    GFR calc non Af Amer 38 (*)    GFR calc Af  Amer 44 (*)    All other components within normal limits  CBC - Abnormal; Notable for the following:    WBC 13.2 (*)    RDW 17.2 (*)    Platelets 564 (*)    All other components within normal limits  URINALYSIS, COMPLETE (UACMP) WITH MICROSCOPIC - Abnormal; Notable for the following:    Color, Urine STRAW (*)    APPearance CLEAR (*)    Hgb urine dipstick SMALL (*)    Protein, ur 100 (*)    Bacteria, UA RARE (*)    All other components within normal limits  C DIFFICILE QUICK SCREEN W PCR REFLEX  GASTROINTESTINAL PANEL BY PCR, STOOL (REPLACES STOOL CULTURE)  LIPASE, BLOOD   ____________________________________________  EKG   ____________________________________________  RADIOLOGY  IMPRESSION: 1. New moderate left perinephric fluid and edema. New moderate left hydronephrosis and hydroureter, with obstruction of the distal left ureter by a large left pelvic mass/metastatic adenopathy. 2. Interval increase in metastatic adenopathy within the abdomen and pelvis as described above. 3. Resolving areas of consolidation in the right middle lobe and right lower lobe at the site of prior pulmonary infarcts. New nodular density in the left lower lobe, uncertain  if this also represents resolving infarct, though given the somewhat masslike configuration, cannot exclude in metastatic nodule given the increased adenopathy within the abdomen and pelvis.   Electronically Signed By: Donavan Foil M.D. On: 12/25/2016 22:47            ____________________________________________   PROCEDURES  Procedure(s) performed:   Procedures  Critical Care performed:   ____________________________________________   INITIAL IMPRESSION / ASSESSMENT AND PLAN / ED COURSE  Pertinent labs & imaging results that were available during my care of the patient were reviewed by me and considered in my medical decision making (see chart for details).  DDX: Infectious diarrhea, C. difficile, colitis, diverticulitis, UTI    ----------------------------------------- 12:03 AM on 12/26/2016 -----------------------------------------  Patient found to have a mass in the left lower quadrant. Mass appears to be causing ureteral obstruction. I discussed the case with Dr. Pilar Jarvis of urology who does not feel this is indicated at this time to acutely stent or perform ureterostomy. He recommends follow-up within a week. I also discussed this case with Dr. Grayland Ormond of oncology. He is aware of the patient says that he will be able to schedule an urgent follow-up appointment within this week. Patient does have follow-up with her OB/GYN this Tuesday. Patient was found in her aware of the expanding pelvic mass and the need for urgent follow-up. Otherwise, the patient has had a reassuring workup with a decreasing but still elevated white blood cell count. Reassuring stool studies. Creatinine is also lower than the previous value.patient says that she has OxyContin at home for pain control.  ____________________________________________   FINAL CLINICAL IMPRESSION(S) / ED DIAGNOSES  pelvic mass. Ureteral obstruction.    NEW MEDICATIONS STARTED DURING THIS VISIT:  New  Prescriptions   No medications on file     Note:  This document was prepared using Dragon voice recognition software and may include unintentional dictation errors.     Orbie Pyo, MD 12/26/16 0005

## 2016-12-25 NOTE — ED Triage Notes (Signed)
Arrives with C/O LLQ abdominal pain.  Patient states she has been taking Oxycontin for right lower leg pain, due to DVT.  States recently had some constipation and took PO laxatives.  Started moving bowels yesterday morning -- reports 10 soft stools yesterday.  Also c/o difficulty voiding x 1 day.

## 2016-12-25 NOTE — ED Notes (Signed)
RN to bedside to administer IV fluids. Patient at CT

## 2016-12-25 NOTE — ED Triage Notes (Signed)
First Nurse Note: Pt states that she is having abd pain and nausea for the last 3 days. AAOX3. Skin warm and dry.

## 2016-12-26 NOTE — ED Notes (Signed)
Reviewed d/c instructions, follow-up care, need for PO fluids with patient. Pt verbalized understanding.

## 2016-12-28 NOTE — Progress Notes (Signed)
Gynecologic Oncology Interval Visit   Referring Provider: Maceo Pro, MD [address] [place], [place] [address] [phone]   Chief Concern: endometrial cancer  Subjective:  Vickie Barton is a 83 y.o. female who is seen in consultation from Dr. Leonides Schanz for grade 2 endometrial cancer.   She underwent TLH, BSO, pelvic SLN and biopsy on 02/26/2016. 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   Surgical Pathology  CASE: [phone]  PATIENT: Vickie Barton  Surgical Pathology Report      SPECIMEN SUBMITTED:  A. Sentinel lymph node, left external iliac  B. Uterus with cervix, bilateral tubes and ovaries  C. Adnexa, right   CLINICAL HISTORY:  None provided   PRE-OPERATIVE DIAGNOSIS:  Endometrial cancer   POST-OPERATIVE DIAGNOSIS:  Same as pre-op      DIAGNOSIS:  A. SENTINEL LYMPH NODE, LEFT EXTERNAL ILIAC; EXCISION:  - NO TUMOR SEEN IN ONE LYMPH NODE (0/1).   B. UTERUS WITH CERVIX, BILATERAL TUBES AND OVARIES; HYSTERECTOMY AND  BILATERAL SALPINGO-OOPHORECTOMY:  - SEROUS ADENOCARCINOMA.  - LYMPH-VASCULAR INVASION PRESENT.  - ENDOMETRIOSIS.  - CHRONIC CERVICITIS.  - PARATUBAL CYST OF LEFT FALLOPIAN TUBE.  - UNREMARKABLE RIGHT FALLOPIAN TUBE AND LEFT OVARY.  - LEIOMYOMATA, UP TO 1.5 CM WITHOUT NECROSIS, ATYPIA OR INCREASED  MITOSES.   C. RIGHT ADNEXA; EXCISION:  - OVARY INVOLVED BY METASTATIC SEROUS ADENOCARCINOMA.  - SEE SUMMARY BELOW.     DIAGNOSIS:  A. PELVIC WASHINGS:  - NEGATIVE FOR MALIGNANT CELLS.    She declined adjuvant treatment on numerous occasions and has been followed with serial scans.   11/04/2016 CT scan C/A/P IMPRESSION: 1. Examination is positive for are large bilateral pulmonary emboli with associated right heart strain. RV to LV ratio equals 2.0. 2. Right middle lobe and posterior right lung base infarcts. 3. Interval progression of abdominal and pelvic  metastatic adenopathy.  12/25/2016 CT scan A/P IMPRESSION: 1. New moderate left perinephric fluid and edema. New moderate left hydronephrosis and hydroureter, with obstruction of the distal left ureter by a large left pelvic mass/metastatic adenopathy. 2. Interval increase in metastatic adenopathy within the abdomen and pelvis as described above. 3. Resolving areas of consolidation in the right middle lobe and right lower lobe at the site of prior pulmonary infarcts. New nodular density in the left lower lobe, uncertain if this also represents resolving infarct, though given the somewhat masslike configuration, cannot exclude in metastatic nodule given the increased adenopathy within the abdomen and pelvis.   She presents today to discuss treatment options.   Gynecologic Oncology The patient is a pleasant female who is seen in consultation from Dr. Leonides Schanz  for grade 2 endometrial cancer. See prior notes for complete details.  Due to heavy bleeding the patient underwent D&C Hysteroscopy on 01/28/2016. The uterus sounded to 9 cm and uterine cavity filled with fluffy polypoid tissue. Pathology c/w serous cancer.   Preop CT scan concerning for left ovarian mass, sigmoid diverticulosis; there was no evidence of metastatic disease.   She opted for surgical management.       Problem List: Patient Active Problem List   Diagnosis Date Noted  . CAP (community acquired pneumonia) 10/24/2016  . Endometrial cancer ([place]) 02/19/2016    Past Medical History: Past Medical History:  Diagnosis Date  . Arthritis   . CHF (congestive heart failure) ([place])   . Constipation   . DVT (deep venous thrombosis) ([place])   . Endometrial cancer ([place]) 01/28/2016  . Hyperlipidemia   .  Hypertension   . Hypothyroidism     Past Surgical History: Past Surgical History:  Procedure Laterality Date  . DILATION AND CURETTAGE OF UTERUS    . HYSTEROSCOPY W/D&C N/A 01/28/2016   Procedure: DILATATION AND  CURETTAGE /HYSTEROSCOPY;  Surgeon: [name], MD;  Location: [facility] ORS;  Service: Gynecology;  Laterality: N/A;  . LAPAROSCOPIC BILATERAL SALPINGO OOPHERECTOMY Bilateral 02/26/2016   Procedure: LAPAROSCOPIC BILATERAL SALPINGO OOPHORECTOMY;  Surgeon: [name], MD;  Location: [facility] ORS;  Service: Gynecology;  Laterality: Bilateral;  . LAPAROSCOPIC HYSTERECTOMY N/A 02/26/2016   Procedure: HYSTERECTOMY TOTAL LAPAROSCOPIC;  Surgeon: [name], MD;  Location: [facility] ORS;  Service: Gynecology;  Laterality: N/A;  . SENTINEL NODE BIOPSY N/A 02/26/2016   Procedure: SENTINEL NODE BIOPSY;  Surgeon: [name], MD;  Location: [facility] ORS;  Service: Gynecology;  Laterality: N/A;  . TONSILLECTOMY      Past Gynecologic History:  Menarche: 18 History of Abnormal pap: no, no abnormalities Last mammogram: in her 57's   OB History:  OB History  Gravida Para Term Preterm AB Living  0 0          SAB TAB Ectopic Multiple Live Births                   Family History: Family History  Problem Relation Age of Onset  . Diabetes Father   . Heart disease Father   . Hypertension Father   . Diabetes Paternal Grandmother   . Heart disease Mother   . Hypertension Mother   . Stroke Mother   . Hypertension Sister   . Thyroid disease Sister   . Hypertension Brother     Social History: Social History   Social History  . Marital status: Widowed    Spouse name: N/A  . Number of children: N/A  . Years of education: N/A   Occupational History  . retired    Social History Main Topics  . Smoking status: Former Research scientist (life sciences)  . Smokeless tobacco: Never Used  . Alcohol use No  . Drug use: No  . Sexual activity: Not on file   Other Topics Concern  . Not on file   Social History Narrative  . No narrative on file    Allergies: Allergies  Allergen Reactions  . Sulfa Antibiotics Rash  . Zinc Rash    Current Medications: Current Outpatient Prescriptions  Medication Sig Dispense Refill  .  acetaminophen (TYLENOL) 325 MG tablet Take 650 mg by mouth every 6 (six) hours as needed for moderate pain.    . CVS MELATONIN 3 MG TABS TAKE 1 TABLET BY MOUTH AT BEDTIME FOR INSOMNIA  0  . CVS VITAMIN B-12 500 MCG tablet Take 1,000 mcg by mouth daily.  0  . ELIQUIS 5 MG TABS tablet Take 5 mg by mouth 2 (two) times daily.  0  . folic acid (FOLVITE) 1 MG tablet Take 1 mg by mouth daily.  0  . furosemide (LASIX) 20 MG tablet Take 20 mg by mouth 2 (two) times daily.  0  . KLOR-CON 10 10 MEQ tablet Take 10 mEq by mouth daily. with food  0  . levofloxacin (LEVAQUIN) 250 MG tablet Take 1 tablet (250 mg total) by mouth daily at 6 PM. (Patient not taking: Reported on 12/25/2016) 5 tablet 0  . levothyroxine (SYNTHROID, LEVOTHROID) 100 MCG tablet Take 100 mcg by mouth daily before breakfast.    . lidocaine (LIDODERM) 5 % APPLY TO PAINFUL AREAS ONCE A DAY  FOR 12 HOURS ON AND 12 HOURS OFF  0  . nystatin cream (MYCOSTATIN) APPLY TO AFFECTED AREA TWICE A DAY  0  . oxyCODONE (OXY IR/ROXICODONE) 5 MG immediate release tablet Take 5 mg by mouth every 4 (four) hours as needed.  0  . senna (SENOKOT) 8.6 MG tablet Take 2 tablets by mouth 2 (two) times daily.     No current facility-administered medications for this visit.     Review of Systems General: fatigue decreased appetite, and weakness Skin: negative for changes in color, texture, moles or lesions Pulmonary: negative for, dyspnea, productive cough Cardiac: negative for, palpitations, pain Gastrointestinal: nausea, vomiting, constipation, diarrhea Genitourinary/Sexual: issues with bladder function Ob/Gyn: irregular bleeding Musculoskeletal: swelling Hematology: negative for, easy bruising, bleeding Neurologic/Psych: negative for peripheral neuropathy  She also complains of lymph node swelling  Objective:  Physical Examination:  There were no vitals taken for this visit.   ECOG Performance Status: 1 - Symptomatic but completely  ambulatory  General appearance: alert, cooperative and appears stated age HEENT:PERRLA, extra ocular movement intact and sclera clear, anicteric Lymph node survey: non-palpable, axillary, inguinal, supraclavicular Breast exam: Cardiovascular: regular rate and rhythm Respiratory:  lungs clear to auscultation Abdomen: soft, protuberant, nondistended, nontender, no masses palpated, no ascites and no hernias Back: inspection of back is normal Extremities: Significantly enlarged LLE with 4+ edema; RLE normal Skin exam - normal coloration and turgor, no rashes, no suspicious skin lesions noted. Neurological exam reveals alert, oriented, normal speech, no focal findings or movement disorder noted.  Pelvic: patient declined pelvic exam   Lab Review   Chemistry      Component Value Date/Time   NA 131 (L) 12/25/2016 1605   K 3.9 12/25/2016 1605   CL 91 (L) 12/25/2016 1605   CO2 27 12/25/2016 1605   BUN 17 12/25/2016 1605   CREATININE 1.25 (H) 12/25/2016 1605      Component Value Date/Time   CALCIUM 8.3 (L) 12/25/2016 1605   ALKPHOS 96 12/25/2016 1605   AST 24 12/25/2016 1605   ALT 10 (L) 12/25/2016 1605   BILITOT 0.7 12/25/2016 1605     Lab Results  Component Value Date   WBC 13.2 (H) 12/25/2016   HGB 12.8 12/25/2016   HCT 38.3 12/25/2016   MCV 89.9 12/25/2016   PLT 564 (H) 12/25/2016    Radiologic Imaging: CT tomorrow    Assessment:  Vickie Barton is a 83 y.o. 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    Significant pain symptoms due to metastatic disease.   New moderate left hydronephrosis, with obstruction of the distal left ureter by a large left pelvic mass/metastatic adenopathy.  History of PE/DVT, currently on anticoagulation.   Abnormal pulmonary imaging- resolving infarcts vs metastatic disease  Medical co-morbidities complicating care: HTN and obesity. There is no height or weight on file to calculate  BMI.  Plan:   Problem List Items Addressed This Visit      Genitourinary   Endometrial cancer ([place]) - Primary      We discussed options for management. I have recommended consideration of chemotherapy/radiation for palliation. I would recommend starting with chemotherapy. We offered Medical Oncology consultation. She is seeing Dr. Grayland Ormond today. I discussed chemotherapy +/- radiation therapy with him.   With regard to new moderate left perinephric fluid and edema. New moderate left hydronephrosis and hydroureter, with obstruction of the distal left ureter by a large left pelvic mass/metastatic adenopathy. Dr. Pilar Jarvis of Urology  did not feel acutely stent or ureterostomy was indicated. He recommended follow-up within a week.   Continue to monitor pulmonary imaging to assess areas that may represent prior pulmonary infarcts versus metastatic disease.    We reviewed that her cancer is most likely incurable, prognosis is poor, and important goals of care. Hopefully therapy can enhance duration of survival, manage cancer-related symptoms, specifically reduce her pain symptoms which are quite debilitating, and maximize quality of life.   The patient's diagnosis, an outline of the further diagnostic and laboratory studies which will be required, the recommendation, and alternatives were discussed.  All questions were answered to the patient's satisfaction.  A total of 60 minutes were spent with the patient/family today and coordinating care with Dr. Grayland Ormond; 50% was spent in education, counseling and coordination of care for endometrial cancer.    Gillis Ends, MD    CC:  Maceo Pro, MD [place], [place] [address] [phone]

## 2016-12-29 DIAGNOSIS — I2699 Other pulmonary embolism without acute cor pulmonale: Secondary | ICD-10-CM | POA: Diagnosis not present

## 2016-12-29 DIAGNOSIS — I429 Cardiomyopathy, unspecified: Secondary | ICD-10-CM | POA: Diagnosis not present

## 2016-12-29 DIAGNOSIS — C548 Malignant neoplasm of overlapping sites of corpus uteri: Secondary | ICD-10-CM | POA: Diagnosis not present

## 2016-12-29 DIAGNOSIS — I82412 Acute embolism and thrombosis of left femoral vein: Secondary | ICD-10-CM | POA: Diagnosis not present

## 2016-12-29 DIAGNOSIS — I509 Heart failure, unspecified: Secondary | ICD-10-CM | POA: Diagnosis not present

## 2016-12-29 DIAGNOSIS — C7989 Secondary malignant neoplasm of other specified sites: Secondary | ICD-10-CM | POA: Diagnosis not present

## 2016-12-29 DIAGNOSIS — I6340 Cerebral infarction due to embolism of unspecified cerebral artery: Secondary | ICD-10-CM | POA: Diagnosis not present

## 2016-12-29 DIAGNOSIS — I871 Compression of vein: Secondary | ICD-10-CM | POA: Diagnosis not present

## 2016-12-29 DIAGNOSIS — I82432 Acute embolism and thrombosis of left popliteal vein: Secondary | ICD-10-CM | POA: Diagnosis not present

## 2016-12-29 DIAGNOSIS — I110 Hypertensive heart disease with heart failure: Secondary | ICD-10-CM | POA: Diagnosis not present

## 2016-12-29 DIAGNOSIS — N1330 Unspecified hydronephrosis: Secondary | ICD-10-CM | POA: Diagnosis not present

## 2016-12-30 ENCOUNTER — Inpatient Hospital Stay: Payer: Medicare Other | Attending: Oncology | Admitting: Oncology

## 2016-12-30 ENCOUNTER — Telehealth: Payer: Self-pay

## 2016-12-30 ENCOUNTER — Inpatient Hospital Stay (HOSPITAL_BASED_OUTPATIENT_CLINIC_OR_DEPARTMENT_OTHER): Payer: Medicare Other | Admitting: Obstetrics and Gynecology

## 2016-12-30 VITALS — BP 111/78 | HR 87 | Temp 97.5°F | Resp 16 | Ht 61.5 in

## 2016-12-30 DIAGNOSIS — Z86711 Personal history of pulmonary embolism: Secondary | ICD-10-CM

## 2016-12-30 DIAGNOSIS — N131 Hydronephrosis with ureteral stricture, not elsewhere classified: Secondary | ICD-10-CM | POA: Diagnosis not present

## 2016-12-30 DIAGNOSIS — E669 Obesity, unspecified: Secondary | ICD-10-CM | POA: Diagnosis not present

## 2016-12-30 DIAGNOSIS — Z90722 Acquired absence of ovaries, bilateral: Secondary | ICD-10-CM | POA: Diagnosis not present

## 2016-12-30 DIAGNOSIS — E876 Hypokalemia: Secondary | ICD-10-CM | POA: Insufficient documentation

## 2016-12-30 DIAGNOSIS — I110 Hypertensive heart disease with heart failure: Secondary | ICD-10-CM

## 2016-12-30 DIAGNOSIS — C541 Malignant neoplasm of endometrium: Secondary | ICD-10-CM | POA: Diagnosis not present

## 2016-12-30 DIAGNOSIS — E785 Hyperlipidemia, unspecified: Secondary | ICD-10-CM | POA: Diagnosis not present

## 2016-12-30 DIAGNOSIS — R599 Enlarged lymph nodes, unspecified: Secondary | ICD-10-CM | POA: Diagnosis not present

## 2016-12-30 DIAGNOSIS — I509 Heart failure, unspecified: Secondary | ICD-10-CM | POA: Insufficient documentation

## 2016-12-30 DIAGNOSIS — Z86718 Personal history of other venous thrombosis and embolism: Secondary | ICD-10-CM | POA: Diagnosis not present

## 2016-12-30 DIAGNOSIS — R531 Weakness: Secondary | ICD-10-CM | POA: Diagnosis not present

## 2016-12-30 DIAGNOSIS — E039 Hypothyroidism, unspecified: Secondary | ICD-10-CM

## 2016-12-30 DIAGNOSIS — Z7189 Other specified counseling: Secondary | ICD-10-CM

## 2016-12-30 DIAGNOSIS — Z5111 Encounter for antineoplastic chemotherapy: Secondary | ICD-10-CM | POA: Insufficient documentation

## 2016-12-30 DIAGNOSIS — Z79899 Other long term (current) drug therapy: Secondary | ICD-10-CM | POA: Diagnosis not present

## 2016-12-30 DIAGNOSIS — R630 Anorexia: Secondary | ICD-10-CM | POA: Diagnosis not present

## 2016-12-30 DIAGNOSIS — Z90710 Acquired absence of both cervix and uterus: Secondary | ICD-10-CM | POA: Insufficient documentation

## 2016-12-30 DIAGNOSIS — K5900 Constipation, unspecified: Secondary | ICD-10-CM | POA: Diagnosis not present

## 2016-12-30 DIAGNOSIS — R5383 Other fatigue: Secondary | ICD-10-CM | POA: Diagnosis not present

## 2016-12-30 DIAGNOSIS — Z87891 Personal history of nicotine dependence: Secondary | ICD-10-CM

## 2016-12-30 MED ORDER — FENTANYL 25 MCG/HR TD PT72: 25.0000 ug | MEDICATED_PATCH | TRANSDERMAL | 0 refills | Status: DC

## 2016-12-30 NOTE — Telephone Encounter (Signed)
  Oncology Nurse Navigator Documentation Called and  Spoke with Vickie Barton and her daughter. Explained the importance for follow up with Urology per the ED plan based on Ed note. Went back over Ct scan results with her. She will keep appointment with Dr. Pilar Jarvis tomorrow at 10 am.   IMPRESSION: 1. New moderate left perinephric fluid and edema. New moderate left hydronephrosis and hydroureter, with obstruction of the distal left ureter by a large left pelvic mass/metastatic adenopathy. 2. Interval increase in metastatic adenopathy within the abdomen and pelvis as described above. 3. Resolving areas of consolidation in the right middle lobe and right lower lobe at the site of prior pulmonary infarcts. New nodular density in the left lower lobe, uncertain if this also represents resolving infarct, though given the somewhat masslike configuration, cannot exclude in metastatic nodule given the increased adenopathy within the abdomen and   Navigator Location: [facility] (12/30/16 1500)   )Navigator Encounter Type: Telephone (12/30/16 1500)                                                    Time Spent with Patient: 15 (12/30/16 1500)

## 2016-12-30 NOTE — Telephone Encounter (Signed)
nevermind I found her a spot   Vickie Barton

## 2016-12-30 NOTE — Progress Notes (Signed)
Patient has been to ER twice for constipation, denies lower abdominal pain. She has had two falls in the past month.

## 2016-12-30 NOTE — Telephone Encounter (Signed)
Where do you want me to put her? Please advise  Thanks, Sharyn Lull

## 2016-12-30 NOTE — Telephone Encounter (Signed)
Thank you Michelle.  

## 2016-12-30 NOTE — Telephone Encounter (Signed)
  Oncology Nurse Navigator Documentation Vickie Barton was seen in [facility] clinic today. She was evaluated in the ED and per ED physician note below she needs to follow up with Urology. I wanted to ensure this was scheduled?  I discussed the case with Dr. Pilar Jarvis of urology who does not feel this is indicated at this time to acutely stent or perform ureterostomy. He recommends follow-up within a week. I also discussed this case with Dr. Grayland Ormond of oncology. He is aware of the Navigator Location: [facility] (12/30/16 1100)   )Navigator Encounter Type: Telephone (12/30/16 1100)                                                    Time Spent with Patient: 15 (12/30/16 1100)

## 2016-12-31 ENCOUNTER — Ambulatory Visit (INDEPENDENT_AMBULATORY_CARE_PROVIDER_SITE_OTHER): Payer: Medicare Other | Admitting: Urology

## 2016-12-31 ENCOUNTER — Encounter: Payer: Self-pay | Admitting: Urology

## 2016-12-31 VITALS — BP 120/79 | HR 81 | Ht 61.5 in | Wt 150.0 lb

## 2016-12-31 DIAGNOSIS — R109 Unspecified abdominal pain: Secondary | ICD-10-CM

## 2016-12-31 DIAGNOSIS — N179 Acute kidney failure, unspecified: Secondary | ICD-10-CM

## 2016-12-31 DIAGNOSIS — C541 Malignant neoplasm of endometrium: Secondary | ICD-10-CM | POA: Diagnosis not present

## 2016-12-31 DIAGNOSIS — N1339 Other hydronephrosis: Secondary | ICD-10-CM | POA: Diagnosis not present

## 2016-12-31 NOTE — Patient Instructions (Signed)

## 2016-12-31 NOTE — Progress Notes (Signed)
12/31/2016 10:31 AM   Vickie Barton Aug 21, 1930 [id number]  Referring provider: [name] [address] [place], [place] [address]  Chief Complaint  Patient presents with  . New Patient (Initial Visit)    HPI: The patient is an 83 year old female who presents today after being diagnosed in the emergency department with new moderate left hydronephrosis and hydroureter secondary to obstruction of the distal left ureter by large pelvic mass/metastatic adenopathy. This adenopathy is increased in size to approximate 7 cm in the last 2 months.  She does have a known history of endometrial cancer status post total hysterectomy and bilateral salpingo-oophorectomy with pelvic lymph node dissection in December 2017.  She has seen her gynecologist as well as medical oncology who plans to start chemotherapy for this mass. She is having pain from this pelvic mass. She is on a fentanyl past. She does note some difficulties with urination. She has no other urinary complaints at this time.  Creatinine the emergency department was 1.25 with a GFR of 38. Her baseline  creatinine prior to the hydronephrosis being present was 0.78 with a GFR greater than 60.  Urinalysis in the emergency department was free of microscopic hematuria.  PMH: Past Medical History:  Diagnosis Date  . Arthritis   . CHF (congestive heart failure) ([facility])   . Constipation   . DVT (deep venous thrombosis) ([place])   . Endometrial cancer ([place]) 01/28/2016  . Hyperlipidemia   . Hypertension   . Hypothyroidism     Surgical History: Past Surgical History:  Procedure Laterality Date  . DILATION AND CURETTAGE OF UTERUS    . HYSTEROSCOPY W/D&C N/A 01/28/2016   Procedure: DILATATION AND CURETTAGE /HYSTEROSCOPY;  Surgeon: [name], MD;  Location: [facility];  Service: Gynecology;  Laterality: N/A;  . LAPAROSCOPIC BILATERAL SALPINGO OOPHERECTOMY Bilateral 02/26/2016   Procedure: LAPAROSCOPIC BILATERAL SALPINGO  OOPHORECTOMY;  Surgeon: [name], MD;  Location: [facility];  Service: Gynecology;  Laterality: Bilateral;  . LAPAROSCOPIC HYSTERECTOMY N/A 02/26/2016   Procedure: HYSTERECTOMY TOTAL LAPAROSCOPIC;  Surgeon: [name], MD;  Location: [facility] OR[facility];  Service: Gynecology;  Laterality: N/A;  . SENTINEL NODE BIOPSY N/A 02/26/2016   Procedure: SENTINEL NODE BIOPSY;  Surgeon: [name], MD;  Location: [facility];  Service: Gynecology;  Laterality: N/A;  . TONSILLECTOMY      Home Medications:  Allergies as of 12/31/2016      Reactions   Sulfa Antibiotics Rash   Zinc Rash      Medication List       Accurate as of 12/31/16 10:31 AM. Always use your most recent med list.          acetaminophen 325 MG tablet Commonly known as:  TYLENOL Take 650 mg by mouth every 6 (six) hours as needed for moderate pain.   [employer] MELATONIN 3 MG Tabs Generic drug:  Melatonin TAKE 1 TABLET BY MOUTH AT BEDTIME FOR INSOMNIA   [employer] VITAMIN B-12 500 MCG tablet Generic drug:  cyanocobalamin Take 1,000 mcg by mouth daily.   ELIQUIS 5 MG Tabs tablet Generic drug:  apixaban Take 5 mg by mouth 2 (two) times daily.   fentaNYL 25 MCG/HR patch Commonly known as:  DURAGESIC - dosed mcg/hr Place 1 patch (25 mcg total) onto the skin every 3 (three) days.   folic acid 1 MG tablet Commonly known as:  FOLVITE Take 1 mg by mouth daily.   furosemide 20 MG tablet Commonly known as:  LASIX Take 20  mg by mouth 2 (two) times daily.   KLOR-CON 10 10 MEQ tablet Generic drug:  potassium chloride Take 10 mEq by mouth daily. with food   levothyroxine 100 MCG tablet Commonly known as:  SYNTHROID, LEVOTHROID Take 100 mcg by mouth daily before breakfast.   lidocaine 5 % Commonly known as:  LIDODERM APPLY TO PAINFUL AREAS ONCE A DAY FOR 12 HOURS ON AND 12 HOURS OFF   nystatin cream Commonly known as:  MYCOSTATIN APPLY TO AFFECTED AREA TWICE A DAY   senna 8.6 MG tablet Commonly known as:  SENOKOT Take 2 tablets  by mouth 2 (two) times daily.       Allergies:  Allergies  Allergen Reactions  . Sulfa Antibiotics Rash  . Zinc Rash    Family History: Family History  Problem Relation Age of Onset  . Diabetes Father   . Heart disease Father   . Hypertension Father   . Diabetes Paternal Grandmother   . Heart disease Mother   . Hypertension Mother   . Stroke Mother   . Hypertension Sister   . Thyroid disease Sister   . Hypertension Brother     Social History:  reports that she has quit smoking. She has never used smokeless tobacco. She reports that she does not drink alcohol or use drugs.  ROS: UROLOGY Frequent Urination?: No Hard to postpone urination?: No Burning/pain with urination?: No Get up at night to urinate?: No Leakage of urine?: No Urine stream starts and stops?: No Trouble starting stream?: No Do you have to strain to urinate?: Yes Blood in urine?: No Urinary tract infection?: No Sexually transmitted disease?: No Injury to kidneys or bladder?: No Painful intercourse?: No Weak stream?: Yes Currently pregnant?: No Vaginal bleeding?: No Last menstrual period?: n  Gastrointestinal Nausea?: No Vomiting?: No Indigestion/heartburn?: No Diarrhea?: Yes Constipation?: Yes  Constitutional Fever: No Night sweats?: No Weight loss?: Yes Fatigue?: Yes  Skin Skin rash/lesions?: Yes Itching?: No  Eyes Blurred vision?: No Double vision?: No  Ears/Nose/Throat Sore throat?: No Sinus problems?: Yes  Hematologic/Lymphatic Swollen glands?: No Easy bruising?: No  Cardiovascular Leg swelling?: Yes Chest pain?: No  Respiratory Cough?: No Shortness of breath?: No  Endocrine Excessive thirst?: No  Musculoskeletal Back pain?: Yes Joint pain?: No  Neurological Headaches?: No Dizziness?: No  Psychologic Depression?: No Anxiety?: No  Physical Exam: BP 120/79 (BP Location: Left Arm, Patient Position: Sitting, Cuff Size: Normal)   Pulse 81   Ht 5' 1.5"  (1.562 m)   Wt 150 lb (68 kg)   BMI 27.88 kg/m   Constitutional:  Alert and oriented, No acute distress. HEENT: [place] AT, moist mucus membranes.  Trachea midline, no masses. Cardiovascular: No clubbing, cyanosis, or edema. Respiratory: Normal respiratory effort, no increased work of breathing. GI: Abdomen is soft, nontender, nondistended, no abdominal masses GU: No CVA tenderness.  Skin: No rashes, bruises or suspicious lesions. Lymph: Significant left lower extremity edema noted. Neurologic: Grossly intact, no focal deficits, moving all 4 extremities. Psychiatric: Normal mood and affect.  Laboratory Data: Lab Results  Component Value Date   WBC 13.2 (H) 12/25/2016   HGB 12.8 12/25/2016   HCT 38.3 12/25/2016   MCV 89.9 12/25/2016   PLT 564 (H) 12/25/2016    Lab Results  Component Value Date   CREATININE 1.25 (H) 12/25/2016    No results found for: PSA  No results found for: TESTOSTERONE  No results found for: HGBA1C  Urinalysis    Component Value Date/Time   COLORURINE  STRAW (A) 12/25/2016 1605   APPEARANCEUR CLEAR (A) 12/25/2016 1605   LABSPEC 1.006 12/25/2016 1605   PHURINE 7.0 12/25/2016 1605   GLUCOSEU NEGATIVE 12/25/2016 1605   HGBUR SMALL (A) 12/25/2016 1605   BILIRUBINUR NEGATIVE 12/25/2016 [place] 12/25/2016 1605   PROTEINUR 100 (A) 12/25/2016 1605   NITRITE NEGATIVE 12/25/2016 1605   LEUKOCYTESUR NEGATIVE 12/25/2016 1605    Pertinent Imaging: EXAM: CT ABDOMEN AND PELVIS WITH CONTRAST  TECHNIQUE: Multidetector CT imaging of the abdomen and pelvis was performed using the standard protocol following bolus administration of intravenous contrast.  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 9 mm nodule at the extreme right lung base is unchanged. New focus of slightly nodular consolidation within the left lower lobe. No pleural effusion. Coronary artery calcification.  Hepatobiliary: No focal liver abnormality is seen. No gallstones, gallbladder wall thickening, or biliary dilatation.  Pancreas: Unremarkable. No pancreatic ductal dilatation or surrounding inflammatory changes.  Spleen: 8 mm hypodensity within the medial aspect of the spleen is not significantly changed.  Adrenals/Urinary Tract: Adrenal glands are within normal minutes. Stable cysts in the right kidney. Interim development of moderate left perinephric fluid and edema. New moderate left hydronephrosis and hydroureter. Bladder is within normal limits.  Stomach/Bowel: Stomach is within normal limits. Appendix appears normal. No evidence of bowel wall thickening, distention, or inflammatory changes. Sigmoid colon diverticular changes without acute inflammation  Vascular/Lymphatic: Aortic atherosclerosis. Increased size of metastatic adenopathy. Index mass anterior to the IVC measures 3.6 cm compared with 2.1 cm previously. Right common iliac node measures 17 mm, compared with 14 mm previously. Left external iliac mass measures 6.9 x 7 cm, compared with 4.4 cm. This is causing obstruction of the distal left ureter. Increased right pelvic sidewall node, measuring 12 mm, not measured previously. IVC filter at L2-L3.  Reproductive: Status post hysterectomy.  Other: Negative for free air or free fluid. Small fat in the umbilicus.  Musculoskeletal: Stable osseous structures. Degenerative changes of the spine.  IMPRESSION: 1. New moderate left perinephric fluid and edema. New moderate left hydronephrosis and hydroureter, with obstruction of the distal left ureter by a large left pelvic mass/metastatic adenopathy. 2. Interval increase in metastatic adenopathy within the abdomen  and pelvis as described above. 3. Resolving areas of consolidation in the right middle lobe and right lower lobe at the site of prior pulmonary infarcts. New nodular density in the left lower lobe, uncertain if this also represents resolving infarct, though given the somewhat masslike configuration, cannot exclude in metastatic nodule given the increased adenopathy within the abdomen and pelvis.   Assessment & Plan:    1. Left malignant hydronephrosis 2. Endometrial cancer I discussed with the patient and her kidney function has declined over the last 2 months when her hydronephrosis has developed. I did share my concern that she is starting chemotherapy next week and that optimal renal drainage would be ideal. We discussed cystoscopy, left retrograde, Phillip Heal, left ureteral stent placement. We discussed the risks and benefits of this procedure. She understands the risks include need for repeat procedure, inability to place ureteral stent, stent discomfort, bleeding, and infection. She is starting chemotherapy next week, we will try to have her scheduled for this tomorrow. She does understand there is a chance that this will not be  successful. I am not sure she would be a candidate for a left nephrostomy tube at this time if this fails given that she had a recent acute DVT and is on L [place] and may not be able to come off this.   No Follow-up on file.  Nickie Retort, MD  [facility] [address] [place], [place] [address] [phone]

## 2017-01-01 ENCOUNTER — Ambulatory Visit: Payer: Medicare Other | Admitting: Certified Registered"

## 2017-01-01 ENCOUNTER — Encounter
Admission: RE | Disposition: A | Discharge status: Home / Self Care | Payer: Self-pay | Source: Ambulatory Visit | Attending: Urology

## 2017-01-01 ENCOUNTER — Ambulatory Visit
Admission: RE | Admit: 2017-01-01 | Discharge: 2017-01-01 | Disposition: A | Discharge status: Home / Self Care | Payer: Medicare Other | Source: Ambulatory Visit | Attending: Urology | Admitting: Urology

## 2017-01-01 DIAGNOSIS — I10 Essential (primary) hypertension: Secondary | ICD-10-CM | POA: Diagnosis not present

## 2017-01-01 DIAGNOSIS — Z79899 Other long term (current) drug therapy: Secondary | ICD-10-CM | POA: Insufficient documentation

## 2017-01-01 DIAGNOSIS — C541 Malignant neoplasm of endometrium: Secondary | ICD-10-CM | POA: Diagnosis not present

## 2017-01-01 DIAGNOSIS — Z7902 Long term (current) use of antithrombotics/antiplatelets: Secondary | ICD-10-CM | POA: Insufficient documentation

## 2017-01-01 DIAGNOSIS — E039 Hypothyroidism, unspecified: Secondary | ICD-10-CM | POA: Insufficient documentation

## 2017-01-01 DIAGNOSIS — N1330 Unspecified hydronephrosis: Secondary | ICD-10-CM | POA: Insufficient documentation

## 2017-01-01 DIAGNOSIS — Z87891 Personal history of nicotine dependence: Secondary | ICD-10-CM | POA: Diagnosis not present

## 2017-01-01 DIAGNOSIS — Z86718 Personal history of other venous thrombosis and embolism: Secondary | ICD-10-CM | POA: Diagnosis not present

## 2017-01-01 DIAGNOSIS — N179 Acute kidney failure, unspecified: Secondary | ICD-10-CM | POA: Insufficient documentation

## 2017-01-01 DIAGNOSIS — N1339 Other hydronephrosis: Secondary | ICD-10-CM | POA: Diagnosis not present

## 2017-01-01 DIAGNOSIS — R1900 Intra-abdominal and pelvic swelling, mass and lump, unspecified site: Secondary | ICD-10-CM | POA: Diagnosis not present

## 2017-01-01 DIAGNOSIS — E785 Hyperlipidemia, unspecified: Secondary | ICD-10-CM | POA: Diagnosis not present

## 2017-01-01 DIAGNOSIS — C679 Malignant neoplasm of bladder, unspecified: Secondary | ICD-10-CM

## 2017-01-01 DIAGNOSIS — Z90710 Acquired absence of both cervix and uterus: Secondary | ICD-10-CM | POA: Insufficient documentation

## 2017-01-01 DIAGNOSIS — I110 Hypertensive heart disease with heart failure: Secondary | ICD-10-CM | POA: Diagnosis not present

## 2017-01-01 DIAGNOSIS — I509 Heart failure, unspecified: Secondary | ICD-10-CM | POA: Insufficient documentation

## 2017-01-01 HISTORY — PX: CYSTOSCOPY WITH STENT PLACEMENT: SHX5790

## 2017-01-01 HISTORY — PX: CYSTOSCOPY W/ RETROGRADES: SHX1426

## 2017-01-01 SURGERY — CYSTOSCOPY, WITH RETROGRADE PYELOGRAM
Anesthesia: General | Site: Ureter | Laterality: Left | Wound class: Clean Contaminated

## 2017-01-01 MED ORDER — FAMOTIDINE 20 MG PO TABS
20.0000 mg | ORAL_TABLET | Freq: Once | ORAL | Status: AC
  Administered 2017-01-01: 20 mg via ORAL

## 2017-01-01 MED ORDER — FENTANYL CITRATE (PF) 100 MCG/2ML IJ SOLN
INTRAMUSCULAR | Status: AC
  Filled 2017-01-01: qty 2

## 2017-01-01 MED ORDER — HYDROCODONE-ACETAMINOPHEN 5-325 MG PO TABS
ORAL_TABLET | ORAL | Status: AC
  Filled 2017-01-01: qty 1

## 2017-01-01 MED ORDER — ONDANSETRON HCL 4 MG/2ML IJ SOLN
INTRAMUSCULAR | Status: DC | PRN
Start: 2017-01-01 — End: 2017-01-01
  Administered 2017-01-01: 4 mg via INTRAVENOUS

## 2017-01-01 MED ORDER — PROPOFOL 10 MG/ML IV BOLUS
INTRAVENOUS | Status: DC | PRN
  Administered 2017-01-01: 100 mg via INTRAVENOUS

## 2017-01-01 MED ORDER — FENTANYL CITRATE (PF) 100 MCG/2ML IJ SOLN
INTRAMUSCULAR | Status: DC | PRN
  Administered 2017-01-01 (×2): 50 ug via INTRAVENOUS

## 2017-01-01 MED ORDER — IOTHALAMATE MEGLUMINE 43 % IV SOLN
INTRAVENOUS | Status: DC | PRN
  Administered 2017-01-01: 15 mL via URETHRAL

## 2017-01-01 MED ORDER — FAMOTIDINE 20 MG PO TABS
ORAL_TABLET | ORAL | Status: AC
  Administered 2017-01-01: 20 mg via ORAL
  Filled 2017-01-01: qty 1

## 2017-01-01 MED ORDER — DEXAMETHASONE SODIUM PHOSPHATE 10 MG/ML IJ SOLN
INTRAMUSCULAR | Status: DC | PRN
  Administered 2017-01-01: 5 mg via INTRAVENOUS

## 2017-01-01 MED ORDER — LACTATED RINGERS IV SOLN
INTRAVENOUS | Status: DC
  Administered 2017-01-01: 10:00:00 via INTRAVENOUS

## 2017-01-01 MED ORDER — PROPOFOL 10 MG/ML IV BOLUS
INTRAVENOUS | Status: AC
  Filled 2017-01-01: qty 20

## 2017-01-01 MED ORDER — CEFAZOLIN SODIUM-DEXTROSE 2-4 GM/100ML-% IV SOLN
INTRAVENOUS | Status: AC
  Filled 2017-01-01: qty 100

## 2017-01-01 MED ORDER — CEPHALEXIN 500 MG PO CAPS: 500.0000 mg | ORAL_CAPSULE | Freq: Three times a day (TID) | ORAL | 0 refills | Status: DC

## 2017-01-01 MED ORDER — ONDANSETRON HCL 4 MG/2ML IJ SOLN
INTRAMUSCULAR | Status: AC
  Filled 2017-01-01: qty 2

## 2017-01-01 MED ORDER — FENTANYL CITRATE (PF) 100 MCG/2ML IJ SOLN
25.0000 ug | INTRAMUSCULAR | Status: DC | PRN
  Administered 2017-01-01 (×4): 25 ug via INTRAVENOUS

## 2017-01-01 MED ORDER — LIDOCAINE HCL (PF) 2 % IJ SOLN
INTRAMUSCULAR | Status: DC | PRN
  Administered 2017-01-01: 50 mg

## 2017-01-01 MED ORDER — ONDANSETRON HCL 4 MG/2ML IJ SOLN: 4.0000 mg | Freq: Once | INTRAMUSCULAR | Status: DC | PRN

## 2017-01-01 MED ORDER — CEFAZOLIN SODIUM-DEXTROSE 2-4 GM/100ML-% IV SOLN
2.0000 g | Freq: Once | INTRAVENOUS | Status: AC
  Administered 2017-01-01: 2 g via INTRAVENOUS

## 2017-01-01 MED ORDER — HYDROCODONE-ACETAMINOPHEN 5-325 MG PO TABS
1.0000 | ORAL_TABLET | Freq: Once | ORAL | Status: AC
  Administered 2017-01-01: 1 via ORAL

## 2017-01-01 MED ORDER — FENTANYL CITRATE (PF) 100 MCG/2ML IJ SOLN
INTRAMUSCULAR | Status: AC
  Administered 2017-01-01: 25 ug via INTRAVENOUS
  Filled 2017-01-01: qty 2

## 2017-01-01 SURGICAL SUPPLY — 23 items
BACTOSHIELD CHG 4% 4OZ (MISCELLANEOUS) ×2
CATH URETL 5X70 OPEN END (CATHETERS) ×3 IMPLANT
CONRAY 43 FOR UROLOGY 50M (MISCELLANEOUS) ×3 IMPLANT
GLOVE BIO SURGEON STRL SZ7 (GLOVE) ×6 IMPLANT
GLOVE BIO SURGEON STRL SZ7.5 (GLOVE) ×3 IMPLANT
GOWN STRL REUS W/ TWL LRG LVL3 (GOWN DISPOSABLE) ×1 IMPLANT
GOWN STRL REUS W/ TWL LRG LVL4 (GOWN DISPOSABLE) ×1 IMPLANT
GOWN STRL REUS W/TWL LRG LVL3 (GOWN DISPOSABLE) ×2
GOWN STRL REUS W/TWL LRG LVL4 (GOWN DISPOSABLE) ×2
GOWN STRL REUS W/TWL XL LVL4 (GOWN DISPOSABLE) ×3 IMPLANT
KIT RM TURNOVER CYSTO AR (KITS) ×3 IMPLANT
PACK CYSTO AR (MISCELLANEOUS) ×3 IMPLANT
SCRUB CHG 4% DYNA-HEX 4OZ (MISCELLANEOUS) ×1 IMPLANT
SENSORWIRE 0.038 NOT ANGLED (WIRE) ×3
SET CYSTO W/LG BORE CLAMP LF (SET/KITS/TRAYS/PACK) ×3 IMPLANT
SOL .9 NS 3000ML IRR  AL (IV SOLUTION) ×2
SOL .9 NS 3000ML IRR UROMATIC (IV SOLUTION) ×1 IMPLANT
STENT URET 6FRX24 CONTOUR (STENTS) IMPLANT
STENT URET 6FRX26 CONTOUR (STENTS) IMPLANT
STENT URO INLAY 6FRX24CM (STENTS) ×3 IMPLANT
SURGILUBE 2OZ TUBE FLIPTOP (MISCELLANEOUS) ×3 IMPLANT
WATER STERILE IRR 1000ML POUR (IV SOLUTION) ×3 IMPLANT
WIRE SENSOR 0.038 NOT ANGLED (WIRE) ×1 IMPLANT

## 2017-01-01 NOTE — Transfer of Care (Signed)
Immediate Anesthesia Transfer of Care Note  Patient: Vickie Barton  Procedure(s) Performed: CYSTOSCOPY WITH RETROGRADE PYELOGRAM (Left Ureter) CYSTOSCOPY WITH STENT PLACEMENT (Left Ureter)  Patient Location: PACU  Anesthesia Type:General  Level of Consciousness: sedated  Airway & Oxygen Therapy: Patient Spontanous Breathing and Patient connected to face mask oxygen  Post-op Assessment: Report given to RN and Post -op Vital signs reviewed and stable  Post vital signs: Reviewed  Last Vitals:  Vitals:   01/01/17 1016 01/01/17 1115  BP: (!) 139/98 (!) 141/85  Pulse:  78  Resp:  12  Temp:  (!) 36.2 C  SpO2:  98%    Last Pain:  Vitals:   01/01/17 1013  TempSrc: Tympanic  PainSc: 10-Worst pain ever         Complications: No apparent anesthesia complications

## 2017-01-01 NOTE — Op Note (Addendum)
Date of procedure: 01/01/17  Preoperative diagnosis:  1. Malignant left hydronephrosis 2. Endometrial carcinoma   Postoperative diagnosis:  1. Same   Procedure: 1. Cystoscopy 2. Left retrograde pyelogram with interpretation 3. Left ureteral stent placement 6 [place] by 24 cm-Bard Optima  Surgeon: Baruch Gouty, MD  Anesthesia: General  Complications: None  Intraoperative findings: The patient had left hydroureteronephrosis up to the mid to distal ureter. There is medial deviation of the left ureter from the known 7 mm pelvic mass. Left ureteral stent was successfully placed.  EBL: None  Specimens: None  Drains: 6 French by 24 cm left double-J ureteral stent-Bard Optima  Disposition: Stable to the postanesthesia care unit  Indication for procedure: The patient is a 83 y.o. female with history of endometrial carcinoma status post hysterectomy in December 2017. She recurrence in the left iliac lymph node range. Her mass there has now grown to 7 cm causing acute kidney injury and left hydroureteronephrosis. She presents today for left renal stent placement prior to initiating chemotherapy.  After reviewing the management options for treatment, the patient elected to proceed with the above surgical procedure(s). We have discussed the potential benefits and risks of the procedure, side effects of the proposed treatment, the likelihood of the patient achieving the goals of the procedure, and any potential problems that might occur during the procedure or recuperation. Informed consent has been obtained.  Description of procedure: The patient was met in the preoperative area. All risks, benefits, and indications of the procedure were described in great detail. The patient consented to the procedure. Preoperative antibiotics were given. The patient was taken to the operative theater. General anesthesia was induced per the anesthesia service. The patient was then placed in the dorsal lithotomy  position and prepped and draped in the usual sterile fashion. A preoperative timeout was called.   A 21 French 30 cystoscope was inserted as the patient's bladder per urethra atraumatically. The left ureteral orifice was identified and intubated with an open-ended catheter. The left retrograde pyelogram findings are described above. A sensor wire was advanced to level of the left renal pelvis under fluoroscopy. A 6 French by 24 cm Bard Optima double-J ureteral stent was then placed. The sensor wire was removed. A curl seen in the patient's left renal pelvis on fluoroscopy and in the urinary bladder direct embolization. At this point the patient's bladder was drained. She was woke from anesthesia and transferred in stable condition to the post anesthesia care unit.  Plan: The patient will follow-up in 3 months for stent exchange versus stent removal. Depending on the progress of chemotherapy, we may be able to remove the stent at that time depending on findings on the left retrograde pyelogram. If we exchange her stent, we may be able to extend exchange intervals if the the stent it not heavily calcified at the time of stent exchange.  Baruch Gouty, M.D.

## 2017-01-01 NOTE — Anesthesia Postprocedure Evaluation (Signed)
Anesthesia Post Note  Patient: Vickie Barton  Procedure(s) Performed: CYSTOSCOPY WITH RETROGRADE PYELOGRAM (Left Ureter) CYSTOSCOPY WITH STENT PLACEMENT (Left Ureter)  Patient location during evaluation: PACU Anesthesia Type: General Level of consciousness: awake and alert Pain management: pain level controlled Vital Signs Assessment: post-procedure vital signs reviewed and stable Respiratory status: spontaneous breathing and respiratory function stable Cardiovascular status: stable Anesthetic complications: no     Last Vitals:  Vitals:   01/01/17 1016 01/01/17 1115  BP: (!) 139/98 (!) 141/85  Pulse:  78  Resp:  12  Temp:  (!) 36.2 C  SpO2:  98%    Last Pain:  Vitals:   01/01/17 1115  TempSrc:   PainSc: Asleep                 KEPHART,WILLIAM K

## 2017-01-01 NOTE — OR Nursing (Signed)
Dr Pilar Jarvis and Dr Rosey [place] both aware that patient took Eliquis yesterday and has pain patch on.  No new orders at this time

## 2017-01-01 NOTE — Progress Notes (Signed)
States pain but wants to go home

## 2017-01-01 NOTE — Anesthesia Preprocedure Evaluation (Addendum)
Anesthesia Evaluation  Patient identified by MRN, date of birth, ID band Patient awake    Reviewed: Allergy & Precautions, H&P , NPO status , Patient's Chart, lab work & pertinent test results, reviewed documented beta blocker date and time   History of Anesthesia Complications Negative for: history of anesthetic complications  Airway Mallampati: II  TM Distance: >3 FB Neck ROM: full    Dental  (+) Dental Advidsory Given, Teeth Intact, Caps, Chipped   Pulmonary neg shortness of breath, neg COPD, neg recent URI, former smoker,           Cardiovascular Exercise Tolerance: Good hypertension, (-) angina+CHF  (-) CAD, (-) Past MI, (-) Cardiac Stents and (-) CABG (-) dysrhythmias (-) Valvular Problems/Murmurs     Neuro/Psych negative neurological ROS  negative psych ROS   GI/Hepatic negative GI ROS, Neg liver ROS,   Endo/Other  neg diabetesHypothyroidism   Renal/GU negative Renal ROS  negative genitourinary   Musculoskeletal   Abdominal   Peds  Hematology negative hematology ROS (+) anemia ,   Anesthesia Other Findings Past Medical History: No date: Arthritis No date: CHF (congestive heart failure) ([facility]) No date: Constipation No date: DVT (deep venous thrombosis) ([place]) 01/28/2016: Endometrial cancer ([facility]) No date: Hyperlipidemia No date: Hypertension No date: Hypothyroidism   Reproductive/Obstetrics negative OB ROS                             Anesthesia Physical Anesthesia Plan  ASA: III  Anesthesia Plan: General   Post-op Pain Management:    Induction: Intravenous  PONV Risk Score and Plan: 3 and Ondansetron and Dexamethasone  Airway Management Planned: LMA  Additional Equipment:   Intra-op Plan:   Post-operative Plan: Extubation in OR  Informed Consent: I have reviewed the patients History and Physical, chart, labs and discussed the procedure including the risks, benefits  and alternatives for the proposed anesthesia with the patient or authorized representative who has indicated his/her understanding and acceptance.   Dental Advisory Given  Plan Discussed with: Anesthesiologist, CRNA and Surgeon  Anesthesia Plan Comments:        Anesthesia Quick Evaluation

## 2017-01-01 NOTE — Anesthesia Procedure Notes (Signed)
Procedure Name: LMA Insertion Performed by: Tyrese Capriotti Pre-anesthesia Checklist: Patient identified, Patient being monitored, Timeout performed, Emergency Drugs available and Suction available Patient Re-evaluated:Patient Re-evaluated prior to induction Oxygen Delivery Method: Circle system utilized Preoxygenation: Pre-oxygenation with 100% oxygen Induction Type: IV induction Ventilation: Mask ventilation without difficulty LMA: LMA inserted LMA Size: 3.5 Tube type: Oral Number of attempts: 1 Placement Confirmation: positive ETCO2 and breath sounds checked- equal and bilateral Tube secured with: Tape Dental Injury: Teeth and Oropharynx as per pre-operative assessment        

## 2017-01-01 NOTE — Interval H&P Note (Signed)
History and Physical Interval Note:  01/01/2017 9:59 AM  Vickie Barton  has presented today for surgery, with the diagnosis of C  The various methods of treatment have been discussed with the patient and family. After consideration of risks, benefits and other options for treatment, the patient has consented to  Procedure(s): CYSTOSCOPY WITH RETROGRADE PYELOGRAM (Left) CYSTOSCOPY WITH STENT PLACEMENT (Left) as a surgical intervention .  The patient's history has been reviewed, patient examined, no change in status, stable for surgery.  I have reviewed the patient's chart and labs.  Questions were answered to the patient's satisfaction.    RRR Lungs clear  Nickie Retort

## 2017-01-01 NOTE — H&P (View-Only) (Signed)
12/31/2016 10:31 AM   Vickie Barton 04/18/1930 [id number]  Referring provider: [name] [place], [place] [address]  Chief Complaint  Patient presents with  . New Patient (Initial Visit)    HPI: The patient is an 83 year old female who presents today after being diagnosed in the emergency department with new moderate left hydronephrosis and hydroureter secondary to obstruction of the distal left ureter by large pelvic mass/metastatic adenopathy. This adenopathy is increased in size to approximate 7 cm in the last 2 months.  She does have a known history of endometrial cancer status post total hysterectomy and bilateral salpingo-oophorectomy with pelvic lymph node dissection in December 2017.  She has seen her gynecologist as well as medical oncology who plans to start chemotherapy for this mass. She is having pain from this pelvic mass. She is on a fentanyl past. She does note some difficulties with urination. She has no other urinary complaints at this time.  Creatinine the emergency department was 1.25 with a GFR of 38. Her baseline  creatinine prior to the hydronephrosis being present was 0.78 with a GFR greater than 60.  Urinalysis in the emergency department was free of microscopic hematuria.  PMH: Past Medical History:  Diagnosis Date  . Arthritis   . CHF (congestive heart failure) ([place])   . Constipation   . DVT (deep venous thrombosis) ([place])   . Endometrial cancer ([place]) 01/28/2016  . Hyperlipidemia   . Hypertension   . Hypothyroidism     Surgical History: Past Surgical History:  Procedure Laterality Date  . DILATION AND CURETTAGE OF UTERUS    . HYSTEROSCOPY W/D&C N/A 01/28/2016   Procedure: DILATATION AND CURETTAGE /HYSTEROSCOPY;  Surgeon: [name], MD;  Location: [facility];  Service: Gynecology;  Laterality: N/A;  . LAPAROSCOPIC BILATERAL SALPINGO OOPHERECTOMY Bilateral 02/26/2016   Procedure: LAPAROSCOPIC BILATERAL SALPINGO  OOPHORECTOMY;  Surgeon: [name], MD;  Location: [facility];  Service: Gynecology;  Laterality: Bilateral;  . LAPAROSCOPIC HYSTERECTOMY N/A 02/26/2016   Procedure: HYSTERECTOMY TOTAL LAPAROSCOPIC;  Surgeon: [name], MD;  Location: [facility];  Service: Gynecology;  Laterality: N/A;  . SENTINEL NODE BIOPSY N/A 02/26/2016   Procedure: SENTINEL NODE BIOPSY;  Surgeon: [name], MD;  Location: [facility];  Service: Gynecology;  Laterality: N/A;  . TONSILLECTOMY      Home Medications:  Allergies as of 12/31/2016      Reactions   Sulfa Antibiotics Rash   Zinc Rash      Medication List       Accurate as of 12/31/16 10:31 AM. Always use your most recent med list.          acetaminophen 325 MG tablet Commonly known as:  TYLENOL Take 650 mg by mouth every 6 (six) hours as needed for moderate pain.   [facility] MELATONIN 3 MG Tabs Generic drug:  Melatonin TAKE 1 TABLET BY MOUTH AT BEDTIME FOR INSOMNIA   CVS VITAMIN B-12 500 MCG tablet Generic drug:  cyanocobalamin Take 1,000 mcg by mouth daily.   ELIQUIS 5 MG Tabs tablet Generic drug:  apixaban Take 5 mg by mouth 2 (two) times daily.   fentaNYL 25 MCG/HR patch Commonly known as:  DURAGESIC - dosed mcg/hr Place 1 patch (25 mcg total) onto the skin every 3 (three) days.   folic acid 1 MG tablet Commonly known as:  FOLVITE Take 1 mg by mouth daily.   furosemide 20 MG tablet Commonly known as:  LASIX Take 20  mg by mouth 2 (two) times daily.   KLOR-CON 10 10 MEQ tablet Generic drug:  potassium chloride Take 10 mEq by mouth daily. with food   levothyroxine 100 MCG tablet Commonly known as:  SYNTHROID, LEVOTHROID Take 100 mcg by mouth daily before breakfast.   lidocaine 5 % Commonly known as:  LIDODERM APPLY TO PAINFUL AREAS ONCE A DAY FOR 12 HOURS ON AND 12 HOURS OFF   nystatin cream Commonly known as:  MYCOSTATIN APPLY TO AFFECTED AREA TWICE A DAY   senna 8.6 MG tablet Commonly known as:  SENOKOT Take 2 tablets  by mouth 2 (two) times daily.       Allergies:  Allergies  Allergen Reactions  . Sulfa Antibiotics Rash  . Zinc Rash    Family History: Family History  Problem Relation Age of Onset  . Diabetes Father   . Heart disease Father   . Hypertension Father   . Diabetes Paternal Grandmother   . Heart disease Mother   . Hypertension Mother   . Stroke Mother   . Hypertension Sister   . Thyroid disease Sister   . Hypertension Brother     Social History:  reports that she has quit smoking. She has never used smokeless tobacco. She reports that she does not drink alcohol or use drugs.  ROS: UROLOGY Frequent Urination?: No Hard to postpone urination?: No Burning/pain with urination?: No Get up at night to urinate?: No Leakage of urine?: No Urine stream starts and stops?: No Trouble starting stream?: No Do you have to strain to urinate?: Yes Blood in urine?: No Urinary tract infection?: No Sexually transmitted disease?: No Injury to kidneys or bladder?: No Painful intercourse?: No Weak stream?: Yes Currently pregnant?: No Vaginal bleeding?: No Last menstrual period?: n  Gastrointestinal Nausea?: No Vomiting?: No Indigestion/heartburn?: No Diarrhea?: Yes Constipation?: Yes  Constitutional Fever: No Night sweats?: No Weight loss?: Yes Fatigue?: Yes  Skin Skin rash/lesions?: Yes Itching?: No  Eyes Blurred vision?: No Double vision?: No  Ears/Nose/Throat Sore throat?: No Sinus problems?: Yes  Hematologic/Lymphatic Swollen glands?: No Easy bruising?: No  Cardiovascular Leg swelling?: Yes Chest pain?: No  Respiratory Cough?: No Shortness of breath?: No  Endocrine Excessive thirst?: No  Musculoskeletal Back pain?: Yes Joint pain?: No  Neurological Headaches?: No Dizziness?: No  Psychologic Depression?: No Anxiety?: No  Physical Exam: BP 120/79 (BP Location: Left Arm, Patient Position: Sitting, Cuff Size: Normal)   Pulse 81   Ht 5' 1.5"  (1.562 m)   Wt 150 lb (68 kg)   BMI 27.88 kg/m   Constitutional:  Alert and oriented, No acute distress. HEENT: [place] AT, moist mucus membranes.  Trachea midline, no masses. Cardiovascular: No clubbing, cyanosis, or edema. Respiratory: Normal respiratory effort, no increased work of breathing. GI: Abdomen is soft, nontender, nondistended, no abdominal masses GU: No CVA tenderness.  Skin: No rashes, bruises or suspicious lesions. Lymph: Significant left lower extremity edema noted. Neurologic: Grossly intact, no focal deficits, moving all 4 extremities. Psychiatric: Normal mood and affect.  Laboratory Data: Lab Results  Component Value Date   WBC 13.2 (H) 12/25/2016   HGB 12.8 12/25/2016   HCT 38.3 12/25/2016   MCV 89.9 12/25/2016   PLT 564 (H) 12/25/2016    Lab Results  Component Value Date   CREATININE 1.25 (H) 12/25/2016    No results found for: PSA  No results found for: TESTOSTERONE  No results found for: HGBA1C  Urinalysis    Component Value Date/Time   COLORURINE  STRAW (A) 12/25/2016 1605   APPEARANCEUR CLEAR (A) 12/25/2016 1605   LABSPEC 1.006 12/25/2016 1605   PHURINE 7.0 12/25/2016 1605   GLUCOSEU NEGATIVE 12/25/2016 1605   HGBUR SMALL (A) 12/25/2016 1605   BILIRUBINUR NEGATIVE 12/25/2016 [place] 12/25/2016 1605   PROTEINUR 100 (A) 12/25/2016 1605   NITRITE NEGATIVE 12/25/2016 1605   LEUKOCYTESUR NEGATIVE 12/25/2016 1605    Pertinent Imaging: EXAM: CT ABDOMEN AND PELVIS WITH CONTRAST  TECHNIQUE: Multidetector CT imaging of the abdomen and pelvis was performed using the standard protocol following bolus administration of intravenous contrast.  CONTRAST:  25mL ISOVUE-300 IOPAMIDOL (ISOVUE-300) INJECTION 61%  COMPARISON:  11/04/2016, 02/20/2016  FINDINGS: Lower chest: Lung bases demonstrate decreased areas of consolidation in the right middle lobe and posterior right lower lobe, corresponding to prior suspected pulmonary  infarcts. 9 mm nodule at the extreme right lung base is unchanged. New focus of slightly nodular consolidation within the left lower lobe. No pleural effusion. Coronary artery calcification.  Hepatobiliary: No focal liver abnormality is seen. No gallstones, gallbladder wall thickening, or biliary dilatation.  Pancreas: Unremarkable. No pancreatic ductal dilatation or surrounding inflammatory changes.  Spleen: 8 mm hypodensity within the medial aspect of the spleen is not significantly changed.  Adrenals/Urinary Tract: Adrenal glands are within normal minutes. Stable cysts in the right kidney. Interim development of moderate left perinephric fluid and edema. New moderate left hydronephrosis and hydroureter. Bladder is within normal limits.  Stomach/Bowel: Stomach is within normal limits. Appendix appears normal. No evidence of bowel wall thickening, distention, or inflammatory changes. Sigmoid colon diverticular changes without acute inflammation  Vascular/Lymphatic: Aortic atherosclerosis. Increased size of metastatic adenopathy. Index mass anterior to the IVC measures 3.6 cm compared with 2.1 cm previously. Right common iliac node measures 17 mm, compared with 14 mm previously. Left external iliac mass measures 6.9 x 7 cm, compared with 4.4 cm. This is causing obstruction of the distal left ureter. Increased right pelvic sidewall node, measuring 12 mm, not measured previously. IVC filter at L2-L3.  Reproductive: Status post hysterectomy.  Other: Negative for free air or free fluid. Small fat in the umbilicus.  Musculoskeletal: Stable osseous structures. Degenerative changes of the spine.  IMPRESSION: 1. New moderate left perinephric fluid and edema. New moderate left hydronephrosis and hydroureter, with obstruction of the distal left ureter by a large left pelvic mass/metastatic adenopathy. 2. Interval increase in metastatic adenopathy within the abdomen  and pelvis as described above. 3. Resolving areas of consolidation in the right middle lobe and right lower lobe at the site of prior pulmonary infarcts. New nodular density in the left lower lobe, uncertain if this also represents resolving infarct, though given the somewhat masslike configuration, cannot exclude in metastatic nodule given the increased adenopathy within the abdomen and pelvis.   Assessment & Plan:    1. Left malignant hydronephrosis 2. Endometrial cancer I discussed with the patient and her kidney function has declined over the last 2 months when her hydronephrosis has developed. I did share my concern that she is starting chemotherapy next week and that optimal renal drainage would be ideal. We discussed cystoscopy, left retrograde, Phillip Heal, left ureteral stent placement. We discussed the risks and benefits of this procedure. She understands the risks include need for repeat procedure, inability to place ureteral stent, stent discomfort, bleeding, and infection. She is starting chemotherapy next week, we will try to have her scheduled for this tomorrow. She does understand there is a chance that this will not be  successful. I am not sure she would be a candidate for a left nephrostomy tube at this time if this fails given that she had a recent acute DVT and is on L [place] and may not be able to come off this.   No Follow-up on file.  Nickie Retort, MD  [facility] [address], [place], [place] [address] [phone]

## 2017-01-01 NOTE — Progress Notes (Signed)
Voided x1  Minimal blood in urine

## 2017-01-01 NOTE — Anesthesia Post-op Follow-up Note (Signed)
Anesthesia QCDR form completed.        

## 2017-01-02 DIAGNOSIS — Z7189 Other specified counseling: Secondary | ICD-10-CM | POA: Insufficient documentation

## 2017-01-02 MED ORDER — ONDANSETRON HCL 8 MG PO TABS: 8.0000 mg | ORAL_TABLET | Freq: Two times a day (BID) | ORAL | 2 refills | Status: DC | PRN

## 2017-01-02 MED ORDER — PROCHLORPERAZINE MALEATE 10 MG PO TABS: 10.0000 mg | ORAL_TABLET | Freq: Four times a day (QID) | ORAL | 2 refills | Status: DC | PRN

## 2017-01-02 NOTE — Progress Notes (Signed)
START ON PATHWAY REGIMEN - Uterine     A cycle is every 21 days:     Paclitaxel      Carboplatin   **Always confirm dose/schedule in your pharmacy ordering system**    Patient Characteristics: Papillary Serous and Clear Cell Histology, First Line, Medically Inoperable, Stage II, III, and IV AJCC T Category: TX AJCC N Category: NX AJCC M Category: M0 AJCC 8 Stage Grouping: IIIA Would you be surprised if this patient died  in the next year<= I would NOT be surprised if this patient died in the next year Line of therapy: First Line Patient Status: Medically Inoperable Intent of Therapy: Non-Curative / Palliative Intent, Discussed with Patient

## 2017-01-02 NOTE — Progress Notes (Signed)
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  INTERVAL HISTORY: Patient is an 83 year old female who underwent total hysterectomy on February 26, 2016 for the above stated endometrial carcinoma. It was recommended that she have adjuvant chemotherapy at that time, but patient refused and subsequently missed multiple follow-up appointments. She recently presented to the emergency room with significant pain and found to have recurrent progressive disease. She currently continues to have pain as well as significant constipation. She also has chronic left leg edema. She has no neurologic complaints. She denies any recent fevers or illnesses. She has a poor appetite and denies weight loss. She denies any chest pain or shortness of breath. She has no nausea and she has no urinary complaints. Patient feels generally terrible, but offers no further specific complaints.  REVIEW OF SYSTEMS:   Review of Systems  Constitutional: Positive for malaise/fatigue and weight loss. Negative for fever.  Respiratory: Negative.  Negative for cough and shortness of breath.   Cardiovascular: Negative.  Negative for chest pain and leg swelling.  Gastrointestinal: Positive for abdominal pain and constipation. Negative for nausea and vomiting.  Genitourinary: Negative.   Musculoskeletal: Negative.   Skin: Negative.  Negative for rash.  Neurological: Positive for weakness.  Psychiatric/Behavioral: Negative.  The patient is not nervous/anxious.     As per HPI. Otherwise, a complete review of systems is negative.  PAST MEDICAL HISTORY: Past Medical History:  Diagnosis Date  . Arthritis   . CHF (congestive heart failure) ([place])   . Constipation   .  DVT (deep venous thrombosis) ([place])   . Endometrial cancer ([place]) 01/28/2016  . Hyperlipidemia   . Hypertension   . Hypothyroidism     PAST SURGICAL HISTORY: Past Surgical History:  Procedure Laterality Date  . CYSTOSCOPY W/ RETROGRADES Left 01/01/2017   Procedure: CYSTOSCOPY WITH RETROGRADE PYELOGRAM;  Surgeon: Nickie Retort, MD;  Location: [facility] OR[facility];  Service: Urology;  Laterality: Left;  . CYSTOSCOPY WITH STENT PLACEMENT Left 01/01/2017   Procedure: CYSTOSCOPY WITH STENT PLACEMENT;  Surgeon: Nickie Retort, MD;  Location: [facility];  Service: Urology;  Laterality: Left;  . DILATION AND CURETTAGE OF UTERUS    . HYSTEROSCOPY W/D&C N/A 01/28/2016   Procedure: DILATATION AND CURETTAGE /HYSTEROSCOPY;  Surgeon: [name], MD;  Location: [facility];  Service: Gynecology;  Laterality: N/A;  . LAPAROSCOPIC BILATERAL SALPINGO OOPHERECTOMY Bilateral 02/26/2016   Procedure: LAPAROSCOPIC BILATERAL SALPINGO OOPHORECTOMY;  Surgeon: [name], MD;  Location: [facility];  Service: Gynecology;  Laterality: Bilateral;  . LAPAROSCOPIC HYSTERECTOMY N/A 02/26/2016   Procedure: HYSTERECTOMY TOTAL LAPAROSCOPIC;  Surgeon: [name], MD;  Location: [facility];  Service: Gynecology;  Laterality: N/A;  . SENTINEL NODE BIOPSY N/A 02/26/2016   Procedure: SENTINEL NODE BIOPSY;  Surgeon: [name], MD;  Location: [facility];  Service: Gynecology;  Laterality: N/A;  . TONSILLECTOMY      FAMILY HISTORY: Family History  Problem Relation Age of Onset  . Diabetes Father   . Heart disease Father   . Hypertension Father   . Diabetes Paternal Grandmother   . Heart disease Mother   . Hypertension Mother   . Stroke Mother   . Hypertension Sister   . Thyroid disease Sister   . Hypertension Brother  ADVANCED DIRECTIVES (Y/N):  N  HEALTH MAINTENANCE: Social History  Substance Use Topics  . Smoking status: Former Research scientist (life sciences)  . Smokeless tobacco: Never Used  . Alcohol use No      Colonoscopy:  PAP:  Bone density:  Lipid panel:  Allergies  Allergen Reactions  . Sulfa Antibiotics Rash  . Zinc Rash    Current Outpatient Prescriptions  Medication Sig Dispense Refill  . acetaminophen (TYLENOL) 325 MG tablet Take 650 mg by mouth every 6 (six) hours as needed for moderate pain.    . cephALEXin (KEFLEX) 500 MG capsule Take 1 capsule (500 mg total) by mouth 3 (three) times daily. 6 capsule 0  . CVS MELATONIN 3 MG TABS TAKE 1 TABLET BY MOUTH AT BEDTIME FOR INSOMNIA  0  . CVS VITAMIN B-12 500 MCG tablet Take 1,000 mcg by mouth daily.  0  . ELIQUIS 5 MG TABS tablet Take 5 mg by mouth 2 (two) times daily.  0  . fentaNYL (DURAGESIC - DOSED MCG/HR) 25 MCG/HR patch Place 1 patch (25 mcg total) onto the skin every 3 (three) days. 5 patch 0  . folic acid (FOLVITE) 1 MG tablet Take 1 mg by mouth daily.  0  . furosemide (LASIX) 20 MG tablet Take 20 mg by mouth 2 (two) times daily.  0  . KLOR-CON 10 10 MEQ tablet Take 10 mEq by mouth daily. with food  0  . levothyroxine (SYNTHROID, LEVOTHROID) 100 MCG tablet Take 100 mcg by mouth daily before breakfast.    . lidocaine (LIDODERM) 5 % APPLY TO PAINFUL AREAS ONCE A DAY FOR 12 HOURS ON AND 12 HOURS OFF  0  . nystatin cream (MYCOSTATIN) APPLY TO AFFECTED AREA TWICE A DAY  0  . ondansetron (ZOFRAN) 8 MG tablet Take 1 tablet (8 mg total) by mouth 2 (two) times daily as needed for refractory nausea / vomiting. Start on day 3 after chemo. 60 tablet 2  . prochlorperazine (COMPAZINE) 10 MG tablet Take 1 tablet (10 mg total) by mouth every 6 (six) hours as needed (Nausea or vomiting). 60 tablet 2  . senna (SENOKOT) 8.6 MG tablet Take 2 tablets by mouth 2 (two) times daily.     No current facility-administered medications for this visit.     OBJECTIVE: There were no vitals filed for this visit.   There is no height or weight on file to calculate BMI.    ECOG FS:2 - Symptomatic, <50% confined to bed  General: Ill-appearing, sitting in  a wheelchair, no acute distress. Eyes: Pink conjunctiva, anicteric sclera. HEENT: Normocephalic, moist mucous membranes, clear oropharnyx. Lungs: Clear to auscultation bilaterally. Heart: Regular rate and rhythm. No rubs, murmurs, or gallops. Abdomen: Soft, nontender, nondistended. No organomegaly noted, normoactive bowel sounds. Musculoskeletal: 3-4+ left leg lymphedema.  Neuro: Alert, answering all questions appropriately. Cranial nerves grossly intact. Skin: No rashes or petechiae noted. Psych: Normal affect.   LAB RESULTS:  Lab Results  Component Value Date   NA 131 (L) 12/25/2016   K 3.9 12/25/2016   CL 91 (L) 12/25/2016   CO2 27 12/25/2016   GLUCOSE 111 (H) 12/25/2016   BUN 17 12/25/2016   CREATININE 1.25 (H) 12/25/2016   CALCIUM 8.3 (L) 12/25/2016   PROT 7.8 12/25/2016   ALBUMIN 2.3 (L) 12/25/2016   AST 24 12/25/2016   ALT 10 (L) 12/25/2016   ALKPHOS 96 12/25/2016   BILITOT 0.7 12/25/2016   GFRNONAA 38 (L) 12/25/2016   GFRAA 44 (L) 12/25/2016  Lab Results  Component Value Date   WBC 13.2 (H) 12/25/2016   NEUTROABS 15.6 (H) 11/04/2016   HGB 12.8 12/25/2016   HCT 38.3 12/25/2016   MCV 89.9 12/25/2016   PLT 564 (H) 12/25/2016     STUDIES: Ct Abdomen Pelvis W Contrast  Result Date: 12/25/2016 CLINICAL DATA:  Abdominal pain EXAM: CT ABDOMEN AND PELVIS WITH CONTRAST TECHNIQUE: Multidetector CT imaging of the abdomen and pelvis was performed using the standard protocol following bolus administration of intravenous contrast. 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 9 mm nodule at the extreme right lung base is unchanged. New focus of slightly nodular consolidation within the left lower lobe. No pleural effusion. Coronary  artery calcification. Hepatobiliary: No focal liver abnormality is seen. No gallstones, gallbladder wall thickening, or biliary dilatation. Pancreas: Unremarkable. No pancreatic ductal dilatation or surrounding inflammatory changes. Spleen: 8 mm hypodensity within the medial aspect of the spleen is not significantly changed. Adrenals/Urinary Tract: Adrenal glands are within normal minutes. Stable cysts in the right kidney. Interim development of moderate left perinephric fluid and edema. New moderate left hydronephrosis and hydroureter. Bladder is within normal limits. Stomach/Bowel: Stomach is within normal limits. Appendix appears normal. No evidence of bowel wall thickening, distention, or inflammatory changes. Sigmoid colon diverticular changes without acute inflammation Vascular/Lymphatic: Aortic atherosclerosis. Increased size of metastatic adenopathy. Index mass anterior to the IVC measures 3.6 cm compared with 2.1 cm previously. Right common iliac node measures 17 mm, compared with 14 mm previously. Left external iliac mass measures 6.9 x 7 cm, compared with 4.4 cm. This is causing obstruction of the distal left ureter. Increased right pelvic sidewall node, measuring 12 mm, not measured previously. IVC filter at L2-L3. Reproductive: Status post hysterectomy. Other: Negative for free air or free fluid. Small fat in the umbilicus. Musculoskeletal: Stable osseous structures. Degenerative changes of the spine. IMPRESSION: 1. New moderate left perinephric fluid and edema. New moderate left hydronephrosis and hydroureter, with obstruction of the distal left ureter by a large left pelvic mass/metastatic adenopathy. 2. Interval increase in metastatic adenopathy within the abdomen and pelvis as described above. 3. Resolving areas of consolidation in the right middle lobe and right lower lobe at the site of prior pulmonary infarcts. New nodular density in the left lower lobe, uncertain if this also represents  resolving infarct, though given the somewhat masslike configuration, cannot exclude in metastatic nodule given the increased adenopathy within the abdomen and pelvis. Electronically Signed   By: Donavan Foil M.D.   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  PLAN:    1. 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 Adjuvant treatment was recommended at that time, but patient refused on multiple locations and missed multiple follow-up appointments in the interim. She has now agreed to attempt adjuvant chemotherapy using carboplatinum and Taxol every 3 weeks with Neulasta support. Will not get a port at this time, but if patient wishes to continue treatment one can be placed on a later date. Will also dose reduce carboplatinum and Taxol from the onset given her decreased performance status. Return to clinic on January 06, 2017 to receive cycle 1 of 4.  Approximately 60 minutes was spent in discussion of which greater  than 50% was consultation.  Patient expressed understanding and was in agreement with this plan. She also understands that She can call clinic at any time with any questions, concerns, or complaints.   Cancer Staging Endometrial cancer ([place]) - No stage assigned - Unsigned   [name], MD   01/02/2017 7:43 AM

## 2017-01-04 NOTE — Progress Notes (Signed)
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  INTERVAL HISTORY: Patient returns to clinic today for further evaluation and consideration of cycle 1 of carboplatinum and Taxol. She continues to have significant pain. She also has increased swelling of her left lower extremity. She continues to complain of constipation. She has chronic weakness and fatigue. She has no neurologic complaints. She denies any recent fevers or illnesses. She has a poor appetite and denies weight loss. She denies any chest pain or shortness of breath. She has no nausea and she has no urinary complaints. Patient feels generally terrible, but offers no further specific complaints.  REVIEW OF SYSTEMS:   Review of Systems  Constitutional: Positive for malaise/fatigue and weight loss. Negative for fever.  Respiratory: Negative.  Negative for cough and shortness of breath.   Cardiovascular: Negative.  Negative for chest pain and leg swelling.  Gastrointestinal: Positive for abdominal pain and constipation. Negative for nausea and vomiting.  Genitourinary: Negative.   Musculoskeletal: Negative.   Skin: Negative.  Negative for rash.  Neurological: Positive for weakness.  Psychiatric/Behavioral: Negative.  The patient is not nervous/anxious.     As per HPI. Otherwise, a complete review of systems is negative.  PAST MEDICAL HISTORY: Past Medical History:  Diagnosis Date  . Arthritis   . CHF (congestive heart failure) ([place])   . Constipation   . DVT (deep venous thrombosis) ([place])   . Endometrial cancer ([place]) 01/28/2016  . Hyperlipidemia   . Hypertension   . Hypothyroidism     PAST SURGICAL HISTORY: Past Surgical History:  Procedure Laterality  Date  . CYSTOSCOPY W/ RETROGRADES Left 01/01/2017   Procedure: CYSTOSCOPY WITH RETROGRADE PYELOGRAM;  Surgeon: Vickie Retort, MD;  Location: [facility];  Service: Urology;  Laterality: Left;  . CYSTOSCOPY WITH STENT PLACEMENT Left 01/01/2017   Procedure: CYSTOSCOPY WITH STENT PLACEMENT;  Surgeon: Vickie Retort, MD;  Location: [facility];  Service: Urology;  Laterality: Left;  . DILATION AND CURETTAGE OF UTERUS    . HYSTEROSCOPY W/D&C N/A 01/28/2016   Procedure: DILATATION AND CURETTAGE /HYSTEROSCOPY;  Surgeon: Vickie Loh Ward, MD;  Location: [facility];  Service: Gynecology;  Laterality: N/A;  . LAPAROSCOPIC BILATERAL SALPINGO OOPHERECTOMY Bilateral 02/26/2016   Procedure: LAPAROSCOPIC BILATERAL SALPINGO OOPHORECTOMY;  Surgeon: Vickie Loh Ward, MD;  Location: [facility];  Service: Gynecology;  Laterality: Bilateral;  . LAPAROSCOPIC HYSTERECTOMY N/A 02/26/2016   Procedure: HYSTERECTOMY TOTAL LAPAROSCOPIC;  Surgeon: Vickie Loh Ward, MD;  Location: [facility];  Service: Gynecology;  Laterality: N/A;  . SENTINEL NODE BIOPSY N/A 02/26/2016   Procedure: SENTINEL NODE BIOPSY;  Surgeon: Vickie Loh Ward, MD;  Location: [facility];  Service: Gynecology;  Laterality: N/A;  . TONSILLECTOMY      FAMILY HISTORY: Family History  Problem Relation Age of Onset  . Diabetes Father   . Heart disease Father   . Hypertension Father   . Diabetes Paternal Grandmother   . Heart disease Mother   . Hypertension Mother   . Stroke Mother   . Hypertension Sister   . Thyroid disease Sister   . Hypertension Brother     ADVANCED DIRECTIVES (Y/N):  N  HEALTH MAINTENANCE: Social History  Substance Use Topics  . Smoking status: Former Research scientist (life sciences)  . Smokeless tobacco: Never Used  .  Alcohol use No     Colonoscopy:  PAP:  Bone density:  Lipid panel:  Allergies  Allergen Reactions  . Sulfa Antibiotics Rash  . Zinc Rash    Current Outpatient Prescriptions  Medication Sig Dispense Refill  . acetaminophen (TYLENOL)  325 MG tablet Take 650 mg by mouth every 6 (six) hours as needed for moderate pain.    . cephALEXin (KEFLEX) 500 MG capsule Take 1 capsule (500 mg total) by mouth 3 (three) times daily. 6 capsule 0  . [facility] MELATONIN 3 MG TABS TAKE 1 TABLET BY MOUTH AT BEDTIME FOR INSOMNIA  0  . [facility] VITAMIN B-12 500 MCG tablet Take 1,000 mcg by mouth daily.  0  . ELIQUIS 5 MG TABS tablet Take 5 mg by mouth 2 (two) times daily.  0  . fentaNYL (DURAGESIC - DOSED MCG/HR) 25 MCG/HR patch Place 1 patch (25 mcg total) onto the skin every 3 (three) days. 5 patch 0  . folic acid (FOLVITE) 1 MG tablet Take 1 mg by mouth daily.  0  . furosemide (LASIX) 20 MG tablet Take 20 mg by mouth 2 (two) times daily.  0  . KLOR-CON 10 10 MEQ tablet Take 10 mEq by mouth daily. with food  0  . levothyroxine (SYNTHROID, LEVOTHROID) 100 MCG tablet Take 100 mcg by mouth daily before breakfast.    . lidocaine (LIDODERM) 5 % APPLY TO PAINFUL AREAS ONCE A DAY FOR 12 HOURS ON AND 12 HOURS OFF  0  . nystatin cream (MYCOSTATIN) APPLY TO AFFECTED AREA TWICE A DAY  0  . ondansetron (ZOFRAN) 8 MG tablet Take 1 tablet (8 mg total) by mouth 2 (two) times daily as needed for refractory nausea / vomiting. Start on day 3 after chemo. 60 tablet 2  . senna (SENOKOT) 8.6 MG tablet Take 2 tablets by mouth 2 (two) times daily.    [name] oxyCODONE (OXY IR/ROXICODONE) 5 MG immediate release tablet Take by mouth.    . prochlorperazine (COMPAZINE) 10 MG tablet Take 1 tablet (10 mg total) by mouth every 6 (six) hours as needed (Nausea or vomiting). (Patient not taking: Reported on 01/06/2017) 60 tablet 2   Current Facility-Administered Medications  Medication Dose Route Frequency Provider Last Rate Last Dose  . morphine 2 MG/ML injection 2 mg  2 mg Intravenous Q2H PRN Vickie Huger, MD       Facility-Administered Medications Ordered in Other Visits  Medication Dose Route Frequency Provider Last Rate Last Dose  . dexamethasone (DECADRON) injection 10 mg  10 mg  Intravenous Once Vickie Huger, MD   10 mg at 01/06/17 1112  . famotidine (PEPCID) IVPB 20 mg premix  20 mg Intravenous Once Vickie Huger, MD   20 mg at 01/06/17 1112  . morphine 2 MG/ML injection 2 mg  2 mg Intravenous Q2H PRN Vickie Huger, MD   2 mg at 01/06/17 1056  . PACLitaxel (TAXOL) 258 mg in dextrose 5 % 250 mL chemo infusion (> 43m/m2)  150 mg/m2 (Treatment Plan Recorded) Intravenous Once FLloyd Huger MD      . pegfilgrastim (NEULASTA ONPRO KIT) injection 6 mg  6 mg Subcutaneous Once FLloyd Huger MD        OBJECTIVE: Vitals:   01/06/17 0932  BP: 104/70  Pulse: 85  Resp: 20  Temp: 98 F (36.7 C)     Body mass index is 27.51 kg/m.    ECOG FS:2 - Symptomatic, <50% confined to bed  General: Ill-appearing, sitting in a wheelchair, no acute distress. Eyes: Barton conjunctiva, anicteric sclera. HEENT: Normocephalic, moist mucous membranes, clear oropharnyx. Lungs: Clear to auscultation bilaterally. Heart: Regular rate and rhythm. No rubs, murmurs, or gallops. Abdomen: Soft, nontender, nondistended. No organomegaly noted, normoactive bowel sounds. Musculoskeletal: 3-4+ left leg lymphedema.  Neuro: Alert, answering all questions appropriately. Cranial nerves grossly intact. Skin: No rashes or petechiae noted. Psych: Normal affect.   LAB RESULTS:  Lab Results  Component Value Date   NA 130 (L) 01/06/2017   K 3.6 01/06/2017   CL 96 (L) 01/06/2017   CO2 22 01/06/2017   GLUCOSE 112 (H) 01/06/2017   BUN 11 01/06/2017   CREATININE 0.74 01/06/2017   CALCIUM 7.9 (L) 01/06/2017   PROT 6.3 (L) 01/06/2017   ALBUMIN 1.9 (L) 01/06/2017   AST 30 01/06/2017   ALT 10 (L) 01/06/2017   ALKPHOS 83 01/06/2017   BILITOT 0.4 01/06/2017   GFRNONAA >60 01/06/2017   GFRAA >60 01/06/2017    Lab Results  Component Value Date   WBC 11.2 (H) 01/06/2017   NEUTROABS 7.6 (H) 01/06/2017   HGB 11.2 (L) 01/06/2017   HCT 33.6 (L) 01/06/2017   MCV 88.9  01/06/2017   PLT 531 (H) 01/06/2017     STUDIES: Ct Abdomen Pelvis W Contrast  Result Date: 12/25/2016 CLINICAL DATA:  Abdominal pain EXAM: CT ABDOMEN AND PELVIS WITH CONTRAST TECHNIQUE: Multidetector CT imaging of the abdomen and pelvis was performed using the standard protocol following bolus administration of intravenous contrast. 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 9 mm nodule at the extreme right lung base is unchanged. New focus of slightly nodular consolidation within the left lower lobe. No pleural effusion. Coronary artery calcification. Hepatobiliary: No focal liver abnormality is seen. No gallstones, gallbladder wall thickening, or biliary dilatation. Pancreas: Unremarkable. No pancreatic ductal dilatation or surrounding inflammatory changes. Spleen: 8 mm hypodensity within the medial aspect of the spleen is not significantly changed. Adrenals/Urinary Tract: Adrenal glands are within normal minutes. Stable cysts in the right kidney. Interim development of moderate left perinephric fluid and edema. New moderate left hydronephrosis and hydroureter. Bladder is within normal limits. Stomach/Bowel: Stomach is within normal limits. Appendix appears normal. No evidence of bowel wall thickening, distention, or inflammatory changes. Sigmoid colon diverticular changes without acute inflammation Vascular/Lymphatic: Aortic atherosclerosis. Increased size of metastatic adenopathy. Index mass anterior to the IVC measures 3.6 cm compared with 2.1 cm previously. Right common iliac node measures 17 mm, compared with 14 mm previously. Left external iliac mass measures 6.9 x 7 cm, compared with 4.4 cm. This is causing obstruction of the distal left ureter. Increased  right pelvic sidewall node, measuring 12 mm, not measured previously. IVC filter at L2-L3. Reproductive: Status post hysterectomy. Other: Negative for free air or free fluid. Small fat in the umbilicus. Musculoskeletal: Stable osseous structures. Degenerative changes of the spine. IMPRESSION: 1. New moderate left perinephric fluid and edema. New moderate left hydronephrosis and hydroureter, with obstruction of the distal left ureter by a large left pelvic mass/metastatic adenopathy. 2. Interval increase in metastatic adenopathy within the abdomen and pelvis as described above. 3. Resolving areas of consolidation in the right middle lobe and right lower lobe at the site of prior pulmonary infarcts. New nodular density in the left lower lobe, uncertain if this  also represents resolving infarct, though given the somewhat masslike configuration, cannot exclude in metastatic nodule given the increased adenopathy within the abdomen and pelvis. Electronically Signed   By: Donavan Foil M.D.   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  PLAN:    1. 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 Adjuvant treatment was recommended at that time, but patient refused on multiple occations and missed multiple follow-up appointments in the interim. She has now agreed to attempt adjuvant chemotherapy using carboplatinum and Taxol every 3 weeks with Neulasta support. Will not get a port at this time, but if patient wishes to continue treatment one can be placed on a later date. Will also dose reduce carboplatinum and Taxol from the onset given her decreased performance status. Proceed with cycle 1 of 4 of carboplatinum and Taxol today. Return to clinic in 1 week for laboratory work and further evaluation and then in 3 weeks for consideration of cycle 2. 2. Pain: Continue  fentanyl patch 25 mcg. Patient has been instructed to increase her oxycodone dose to 10 mg every 4 hours as needed. She will also receive 2 mg IV morphine in clinic today.  Approximately 30 minutes was spent in discussion of which greater than 50% was consultation.  Patient expressed understanding and was in agreement with this plan. She also understands that She can call clinic at any time with any questions, concerns, or complaints.   Cancer Staging No matching staging information was found for the patient.   Vickie Huger, MD   01/06/2017 11:16 AM

## 2017-01-05 ENCOUNTER — Inpatient Hospital Stay: Payer: Medicare Other

## 2017-01-06 ENCOUNTER — Inpatient Hospital Stay: Payer: Medicare Other

## 2017-01-06 ENCOUNTER — Inpatient Hospital Stay (HOSPITAL_BASED_OUTPATIENT_CLINIC_OR_DEPARTMENT_OTHER): Payer: Medicare Other | Admitting: Oncology

## 2017-01-06 ENCOUNTER — Other Ambulatory Visit: Payer: Self-pay

## 2017-01-06 VITALS — BP 122/83 | HR 69 | Resp 20

## 2017-01-06 VITALS — BP 104/70 | HR 85 | Temp 98.0°F | Resp 20 | Wt 148.0 lb

## 2017-01-06 DIAGNOSIS — R5383 Other fatigue: Secondary | ICD-10-CM | POA: Diagnosis not present

## 2017-01-06 DIAGNOSIS — Z79899 Other long term (current) drug therapy: Secondary | ICD-10-CM

## 2017-01-06 DIAGNOSIS — R531 Weakness: Secondary | ICD-10-CM | POA: Diagnosis not present

## 2017-01-06 DIAGNOSIS — R630 Anorexia: Secondary | ICD-10-CM

## 2017-01-06 DIAGNOSIS — K5900 Constipation, unspecified: Secondary | ICD-10-CM

## 2017-01-06 DIAGNOSIS — Z86718 Personal history of other venous thrombosis and embolism: Secondary | ICD-10-CM

## 2017-01-06 DIAGNOSIS — E785 Hyperlipidemia, unspecified: Secondary | ICD-10-CM | POA: Diagnosis not present

## 2017-01-06 DIAGNOSIS — E039 Hypothyroidism, unspecified: Secondary | ICD-10-CM

## 2017-01-06 DIAGNOSIS — Z86711 Personal history of pulmonary embolism: Secondary | ICD-10-CM

## 2017-01-06 DIAGNOSIS — N131 Hydronephrosis with ureteral stricture, not elsewhere classified: Secondary | ICD-10-CM | POA: Diagnosis not present

## 2017-01-06 DIAGNOSIS — Z5111 Encounter for antineoplastic chemotherapy: Secondary | ICD-10-CM | POA: Diagnosis not present

## 2017-01-06 DIAGNOSIS — I509 Heart failure, unspecified: Secondary | ICD-10-CM

## 2017-01-06 DIAGNOSIS — R599 Enlarged lymph nodes, unspecified: Secondary | ICD-10-CM | POA: Diagnosis not present

## 2017-01-06 DIAGNOSIS — E669 Obesity, unspecified: Secondary | ICD-10-CM

## 2017-01-06 DIAGNOSIS — I110 Hypertensive heart disease with heart failure: Secondary | ICD-10-CM | POA: Diagnosis not present

## 2017-01-06 DIAGNOSIS — Z87891 Personal history of nicotine dependence: Secondary | ICD-10-CM

## 2017-01-06 DIAGNOSIS — C541 Malignant neoplasm of endometrium: Secondary | ICD-10-CM

## 2017-01-06 DIAGNOSIS — Z90710 Acquired absence of both cervix and uterus: Secondary | ICD-10-CM

## 2017-01-06 DIAGNOSIS — E876 Hypokalemia: Secondary | ICD-10-CM | POA: Diagnosis not present

## 2017-01-06 DIAGNOSIS — Z90722 Acquired absence of ovaries, bilateral: Secondary | ICD-10-CM

## 2017-01-06 LAB — COMPREHENSIVE METABOLIC PANEL
ALBUMIN: 1.9 g/dL — AB (ref 3.5–5.0)
ALT: 10 U/L — AB (ref 14–54)
AST: 30 U/L (ref 15–41)
Alkaline Phosphatase: 83 U/L (ref 38–126)
Anion gap: 12 (ref 5–15)
BUN: 11 mg/dL (ref 6–20)
CHLORIDE: 96 mmol/L — AB (ref 101–111)
CO2: 22 mmol/L (ref 22–32)
CREATININE: 0.74 mg/dL (ref 0.44–1.00)
Calcium: 7.9 mg/dL — ABNORMAL LOW (ref 8.9–10.3)
GFR calc Af Amer: >60 mL/min (ref >60)
GLUCOSE: 112 mg/dL — AB (ref 65–99)
POTASSIUM: 3.6 mmol/L (ref 3.5–5.1)
SODIUM: 130 mmol/L — AB (ref 135–145)
Total Bilirubin: 0.4 mg/dL (ref 0.3–1.2)
Total Protein: 6.3 g/dL — ABNORMAL LOW (ref 6.5–8.1)

## 2017-01-06 LAB — CBC WITH DIFFERENTIAL/PLATELET
BASOS PCT: 2 %
Basophils Absolute: 0.2 10*3/uL — ABNORMAL HIGH (ref 0–0.1)
EOS ABS: 0 10*3/uL (ref 0–0.7)
EOS PCT: 0 %
HCT: 33.6 % — ABNORMAL LOW (ref 35.0–47.0)
Hemoglobin: 11.2 g/dL — ABNORMAL LOW (ref 12.0–16.0)
LYMPHS ABS: 2.7 10*3/uL (ref 1.0–3.6)
Lymphocytes Relative: 24 %
MCH: 29.7 pg (ref 26.0–34.0)
MCHC: 33.5 g/dL (ref 32.0–36.0)
MCV: 88.9 fL (ref 80.0–100.0)
Monocytes Absolute: 0.6 10*3/uL (ref 0.2–0.9)
Monocytes Relative: 6 %
NEUTROS PCT: 68 %
Neutro Abs: 7.6 10*3/uL — ABNORMAL HIGH (ref 1.4–6.5)
PLATELETS: 531 10*3/uL — AB (ref 150–440)
RBC: 3.78 MIL/uL — ABNORMAL LOW (ref 3.80–5.20)
RDW: 17.3 % — ABNORMAL HIGH (ref 11.5–14.5)
WBC: 11.2 10*3/uL — ABNORMAL HIGH (ref 3.6–11.0)

## 2017-01-06 MED ORDER — SODIUM CHLORIDE 0.9 % IV SOLN
Freq: Once | INTRAVENOUS | Status: AC
  Administered 2017-01-06: 11:00:00 via INTRAVENOUS
  Filled 2017-01-06: qty 1000

## 2017-01-06 MED ORDER — FAMOTIDINE IN NACL 20-0.9 MG/50ML-% IV SOLN
20.0000 mg | Freq: Once | INTRAVENOUS | Status: AC
  Administered 2017-01-06: 20 mg via INTRAVENOUS
  Filled 2017-01-06: qty 50

## 2017-01-06 MED ORDER — PACLITAXEL CHEMO INJECTION 300 MG/50ML
150.0000 mg/m2 | Freq: Once | INTRAVENOUS | Status: AC
  Administered 2017-01-06: 258 mg via INTRAVENOUS
  Filled 2017-01-06: qty 43

## 2017-01-06 MED ORDER — PALONOSETRON HCL INJECTION 0.25 MG/5ML
0.2500 mg | Freq: Once | INTRAVENOUS | Status: AC
  Administered 2017-01-06: 0.25 mg via INTRAVENOUS
  Filled 2017-01-06: qty 5

## 2017-01-06 MED ORDER — DEXAMETHASONE SODIUM PHOSPHATE 10 MG/ML IJ SOLN
10.0000 mg | Freq: Once | INTRAMUSCULAR | Status: AC
  Administered 2017-01-06: 10 mg via INTRAVENOUS
  Filled 2017-01-06: qty 1

## 2017-01-06 MED ORDER — MORPHINE SULFATE 2 MG/ML IJ SOLN
2.0000 mg | INTRAMUSCULAR | Status: DC | PRN
Start: 2017-01-06 — End: 2017-01-06

## 2017-01-06 MED ORDER — MORPHINE SULFATE 2 MG/ML IJ SOLN
2.0000 mg | INTRAMUSCULAR | Status: DC | PRN
  Administered 2017-01-06 (×2): 2 mg via INTRAVENOUS
  Filled 2017-01-06 (×2): qty 1

## 2017-01-06 MED ORDER — PEGFILGRASTIM 6 MG/0.6ML ~~LOC~~ PSKT
6.0000 mg | PREFILLED_SYRINGE | Freq: Once | SUBCUTANEOUS | Status: AC
  Administered 2017-01-06: 6 mg via SUBCUTANEOUS
  Filled 2017-01-06: qty 0.6

## 2017-01-06 MED ORDER — SODIUM CHLORIDE 0.9 % IV SOLN
280.0000 mg | Freq: Once | INTRAVENOUS | Status: AC
  Administered 2017-01-06: 280 mg via INTRAVENOUS
  Filled 2017-01-06: qty 28

## 2017-01-06 MED ORDER — DIPHENHYDRAMINE HCL 50 MG/ML IJ SOLN
25.0000 mg | Freq: Once | INTRAMUSCULAR | Status: AC
  Administered 2017-01-06: 25 mg via INTRAVENOUS
  Filled 2017-01-06: qty 1

## 2017-01-06 MED ORDER — SODIUM CHLORIDE 0.9 % IV SOLN: 10.0000 mg | Freq: Once | INTRAVENOUS | Status: DC

## 2017-01-06 NOTE — Progress Notes (Signed)
Pt in today for follow up and first chemo treatment.  Reports in constant pain in left hip and knee, med not helping, rates a 10.

## 2017-01-07 ENCOUNTER — Other Ambulatory Visit: Payer: Self-pay | Admitting: *Deleted

## 2017-01-07 MED ORDER — FENTANYL 50 MCG/HR TD PT72: 50.0000 ug | MEDICATED_PATCH | TRANSDERMAL | 0 refills | Status: DC

## 2017-01-07 MED ORDER — OXYCODONE HCL 10 MG PO TABS: 10.0000 mg | ORAL_TABLET | ORAL | 0 refills | Status: DC | PRN

## 2017-01-07 NOTE — Telephone Encounter (Signed)
Patient reports she was told that we will refill her Oxy and Fentanyl. Please advise if so

## 2017-01-07 NOTE — Telephone Encounter (Signed)
Informed that prescription is ready to pick up and that the dose of both prescription was doubled.

## 2017-01-12 NOTE — Progress Notes (Signed)
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  INTERVAL HISTORY: Patient returns to clinic today for further evaluation and to assess her toleration of cycle 1 of carboplatinum and Taxol. She tolerated her treatment well with minimal side effects. Patient states her pain has resolved and she is no longer taking oxycodone.The swelling of her left lower extremity is unchanged. She does not complain of constipation today. She has chronic weakness and fatigue. She has no neurologic complaints. She denies any recent fevers or illnesses. Her appetite has improved and her weight is stable. She denies any chest pain or shortness of breath. She has no nausea and she has no urinary complaints. Patient offers no further specific complaints.  REVIEW OF SYSTEMS:   Review of Systems  Constitutional: Positive for malaise/fatigue. Negative for fever and weight loss.  Respiratory: Negative.  Negative for cough and shortness of breath.   Cardiovascular: Positive for leg swelling. Negative for chest pain.  Gastrointestinal: Negative for abdominal pain, constipation, nausea and vomiting.  Genitourinary: Negative.   Musculoskeletal: Negative.   Skin: Negative.  Negative for rash.  Neurological: Positive for weakness.  Psychiatric/Behavioral: Negative.  The patient is not nervous/anxious.     As per HPI. Otherwise, a complete review of systems is negative.  PAST MEDICAL HISTORY: Past Medical History:  Diagnosis Date  . Arthritis   . CHF (congestive heart failure) ([place])   . Constipation   . DVT (deep venous thrombosis) ([place])   . Endometrial cancer ([place]) 01/28/2016  . Hyperlipidemia   . Hypertension   . Hypothyroidism     PAST  SURGICAL HISTORY: Past Surgical History:  Procedure Laterality Date  . CYSTOSCOPY W/ RETROGRADES Left 01/01/2017   Procedure: CYSTOSCOPY WITH RETROGRADE PYELOGRAM;  Surgeon: Nickie Retort, MD;  Location: [facility];  Service: Urology;  Laterality: Left;  . CYSTOSCOPY WITH STENT PLACEMENT Left 01/01/2017   Procedure: CYSTOSCOPY WITH STENT PLACEMENT;  Surgeon: Nickie Retort, MD;  Location: [facility];  Service: Urology;  Laterality: Left;  . DILATION AND CURETTAGE OF UTERUS    . HYSTEROSCOPY W/D&C N/A 01/28/2016   Procedure: DILATATION AND CURETTAGE /HYSTEROSCOPY;  Surgeon: [name], MD;  Location: [facility];  Service: Gynecology;  Laterality: N/A;  . LAPAROSCOPIC BILATERAL SALPINGO OOPHERECTOMY Bilateral 02/26/2016   Procedure: LAPAROSCOPIC BILATERAL SALPINGO OOPHORECTOMY;  Surgeon: [name], MD;  Location: [facility];  Service: Gynecology;  Laterality: Bilateral;  . LAPAROSCOPIC HYSTERECTOMY N/A 02/26/2016   Procedure: HYSTERECTOMY TOTAL LAPAROSCOPIC;  Surgeon: [name], MD;  Location: [facility];  Service: Gynecology;  Laterality: N/A;  . SENTINEL NODE BIOPSY N/A 02/26/2016   Procedure: SENTINEL NODE BIOPSY;  Surgeon: [name], MD;  Location: [facility];  Service: Gynecology;  Laterality: N/A;  . TONSILLECTOMY      FAMILY HISTORY: Family History  Problem Relation Age of Onset  . Diabetes Father   . Heart disease Father   . Hypertension Father   . Diabetes Paternal Grandmother   . Heart disease Mother   . Hypertension Mother   . Stroke Mother   . Hypertension Sister   . Thyroid disease Sister   . Hypertension Brother     ADVANCED DIRECTIVES (Y/N):  N  HEALTH MAINTENANCE: Social History  Substance Use Topics  . Smoking status:  Former Smoker  . Smokeless tobacco: Never Used  . Alcohol use No     Colonoscopy:  PAP:  Bone density:  Lipid panel:  Allergies  Allergen Reactions  . Sulfa Antibiotics Rash  . Zinc Rash    Current Outpatient  Prescriptions  Medication Sig Dispense Refill  . acetaminophen (TYLENOL) 325 MG tablet Take 650 mg by mouth every 6 (six) hours as needed for moderate pain.    . cephALEXin (KEFLEX) 500 MG capsule Take 1 capsule (500 mg total) by mouth 3 (three) times daily. 6 capsule 0  . [facility] MELATONIN 3 MG TABS TAKE 1 TABLET BY MOUTH AT BEDTIME FOR INSOMNIA  0  . [facility]S VITAMIN B-12 500 MCG tablet Take 1,000 mcg by mouth daily.  0  . ELIQUIS 5 MG TABS tablet Take 5 mg by mouth 2 (two) times daily.  0  . fentaNYL (DURAGESIC - DOSED MCG/HR) 50 MCG/HR Place 1 patch (50 mcg total) onto the skin every 3 (three) days. 10 patch 0  . folic acid (FOLVITE) 1 MG tablet Take 1 mg by mouth daily.  0  . furosemide (LASIX) 20 MG tablet Take 20 mg by mouth 2 (two) times daily.  0  . KLOR-CON 10 10 MEQ tablet Take 10 mEq by mouth daily. with food  0  . levothyroxine (SYNTHROID, LEVOTHROID) 100 MCG tablet Take 100 mcg by mouth daily before breakfast.    . lidocaine (LIDODERM) 5 % APPLY TO PAINFUL AREAS ONCE A DAY FOR 12 HOURS ON AND 12 HOURS OFF  0  . nystatin cream (MYCOSTATIN) APPLY TO AFFECTED AREA TWICE A DAY  0  . ondansetron (ZOFRAN) 8 MG tablet Take 1 tablet (8 mg total) by mouth 2 (two) times daily as needed for refractory nausea / vomiting. Start on day 3 after chemo. 60 tablet 2  . Oxycodone HCl 10 MG TABS Take 1 tablet (10 mg total) by mouth every 4 (four) hours as needed. 60 tablet 0  . prochlorperazine (COMPAZINE) 10 MG tablet Take 1 tablet (10 mg total) by mouth every 6 (six) hours as needed (Nausea or vomiting). 60 tablet 2  . senna (SENOKOT) 8.6 MG tablet Take 2 tablets by mouth 2 (two) times daily.     No current facility-administered medications for this visit.     OBJECTIVE: Vitals:   01/13/17 1355  BP: 103/69  Pulse: 77  Resp: 18  Temp: 98.9 F (37.2 C)     Body mass index is 27.12 kg/m.    ECOG FS:1 - Symptomatic but completely ambulatory  General: Ill-appearing, sitting in a wheelchair, no acute  distress. Eyes: Pink conjunctiva, anicteric sclera. HEENT: Normocephalic, moist mucous membranes, clear oropharnyx. Lungs: Clear to auscultation bilaterally. Heart: Regular rate and rhythm. No rubs, murmurs, or gallops. Abdomen: Soft, nontender, nondistended. No organomegaly noted, normoactive bowel sounds. Musculoskeletal: 3-4+ left leg lymphedema.  Neuro: Alert, answering all questions appropriately. Cranial nerves grossly intact. Skin: No rashes or petechiae noted. Psych: Normal affect.   LAB RESULTS:  Lab Results  Component Value Date   NA 132 (L) 01/13/2017   K 3.3 (L) 01/13/2017   CL 94 (L) 01/13/2017   CO2 24 01/13/2017   GLUCOSE 117 (H) 01/13/2017   BUN 10 01/13/2017   CREATININE 0.86 01/13/2017   CALCIUM 8.0 (L) 01/13/2017   PROT 6.1 (L) 01/13/2017   ALBUMIN 2.1 (L) 01/13/2017   AST 24 01/13/2017   ALT 11 (L) 01/13/2017   ALKPHOS 117 01/13/2017   BILITOT 0.4 01/13/2017  GFRNONAA >60 01/13/2017   GFRAA >60 01/13/2017    Lab Results  Component Value Date   WBC 11.7 (H) 01/13/2017   NEUTROABS 9.4 (H) 01/13/2017   HGB 11.2 (L) 01/13/2017   HCT 33.9 (L) 01/13/2017   MCV 90.3 01/13/2017   PLT 354 01/13/2017     STUDIES: Ct Abdomen Pelvis W Contrast  Result Date: 12/25/2016 CLINICAL DATA:  Abdominal pain EXAM: CT ABDOMEN AND PELVIS WITH CONTRAST TECHNIQUE: Multidetector CT imaging of the abdomen and pelvis was performed using the standard protocol following bolus administration of intravenous contrast. 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 9 mm nodule at the extreme right lung base is unchanged. New focus of slightly nodular consolidation within the left lower lobe. No pleural effusion. Coronary artery calcification.  Hepatobiliary: No focal liver abnormality is seen. No gallstones, gallbladder wall thickening, or biliary dilatation. Pancreas: Unremarkable. No pancreatic ductal dilatation or surrounding inflammatory changes. Spleen: 8 mm hypodensity within the medial aspect of the spleen is not significantly changed. Adrenals/Urinary Tract: Adrenal glands are within normal minutes. Stable cysts in the right kidney. Interim development of moderate left perinephric fluid and edema. New moderate left hydronephrosis and hydroureter. Bladder is within normal limits. Stomach/Bowel: Stomach is within normal limits. Appendix appears normal. No evidence of bowel wall thickening, distention, or inflammatory changes. Sigmoid colon diverticular changes without acute inflammation Vascular/Lymphatic: Aortic atherosclerosis. Increased size of metastatic adenopathy. Index mass anterior to the IVC measures 3.6 cm compared with 2.1 cm previously. Right common iliac node measures 17 mm, compared with 14 mm previously. Left external iliac mass measures 6.9 x 7 cm, compared with 4.4 cm. This is causing obstruction of the distal left ureter. Increased right pelvic sidewall node, measuring 12 mm, not measured previously. IVC filter at L2-L3. Reproductive: Status post hysterectomy. Other: Negative for free air or free fluid. Small fat in the umbilicus. Musculoskeletal: Stable osseous structures. Degenerative changes of the spine. IMPRESSION: 1. New moderate left perinephric fluid and edema. New moderate left hydronephrosis and hydroureter, with obstruction of the distal left ureter by a large left pelvic mass/metastatic adenopathy. 2. Interval increase in metastatic adenopathy within the abdomen and pelvis as described above. 3. Resolving areas of consolidation in the right middle lobe and right lower lobe at the site of prior pulmonary infarcts. New nodular density in the left lower lobe, uncertain if this also represents resolving infarct, though  given the somewhat masslike configuration, cannot exclude in metastatic nodule given the increased adenopathy within the abdomen and pelvis. Electronically Signed   By: Donavan Foil M.D.   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  PLAN:    1. 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 Adjuvant treatment was recommended at that time, but patient refused on multiple occations and missed multiple follow-up appointments in the interim. She has now agreed to attempt adjuvant chemotherapy using carboplatinum and Taxol every 3 weeks with Neulasta support. Patient continues to decline port placement.  Carboplatinum and Taxol were dose reduced from the onset given her decreased performance status. She tolerated cycle 1 of 4 of carboplatinum and Taxol last week without significant side effects. Return to clinic in 2 weeks for consideration of cycle 2. 2. Pain:  Continue fentanyl patch 25 mcg. Patient states she has now discontinued oxycodone. 3. Hypokalemia: Patient has agreed to reinitiate oral supplementation.  Approximately 30 minutes was spent in discussion of which greater than 50% was consultation.  Patient expressed understanding and was in agreement with this plan. She also understands that She can call clinic at any time with any questions, concerns, or complaints.   Cancer Staging No matching staging information was found for the patient.   [name], MD   01/13/2017 2:59 PM

## 2017-01-13 ENCOUNTER — Inpatient Hospital Stay (HOSPITAL_BASED_OUTPATIENT_CLINIC_OR_DEPARTMENT_OTHER): Payer: Medicare Other | Admitting: Oncology

## 2017-01-13 ENCOUNTER — Inpatient Hospital Stay: Payer: Medicare Other

## 2017-01-13 VITALS — BP 103/69 | HR 77 | Temp 98.9°F | Resp 18 | Wt 145.9 lb

## 2017-01-13 DIAGNOSIS — I110 Hypertensive heart disease with heart failure: Secondary | ICD-10-CM | POA: Diagnosis not present

## 2017-01-13 DIAGNOSIS — R5383 Other fatigue: Secondary | ICD-10-CM | POA: Diagnosis not present

## 2017-01-13 DIAGNOSIS — K5900 Constipation, unspecified: Secondary | ICD-10-CM

## 2017-01-13 DIAGNOSIS — C541 Malignant neoplasm of endometrium: Secondary | ICD-10-CM

## 2017-01-13 DIAGNOSIS — E669 Obesity, unspecified: Secondary | ICD-10-CM

## 2017-01-13 DIAGNOSIS — R630 Anorexia: Secondary | ICD-10-CM

## 2017-01-13 DIAGNOSIS — E785 Hyperlipidemia, unspecified: Secondary | ICD-10-CM | POA: Diagnosis not present

## 2017-01-13 DIAGNOSIS — Z86711 Personal history of pulmonary embolism: Secondary | ICD-10-CM

## 2017-01-13 DIAGNOSIS — N131 Hydronephrosis with ureteral stricture, not elsewhere classified: Secondary | ICD-10-CM | POA: Diagnosis not present

## 2017-01-13 DIAGNOSIS — E039 Hypothyroidism, unspecified: Secondary | ICD-10-CM | POA: Diagnosis not present

## 2017-01-13 DIAGNOSIS — Z90710 Acquired absence of both cervix and uterus: Secondary | ICD-10-CM

## 2017-01-13 DIAGNOSIS — E876 Hypokalemia: Secondary | ICD-10-CM | POA: Diagnosis not present

## 2017-01-13 DIAGNOSIS — Z5111 Encounter for antineoplastic chemotherapy: Secondary | ICD-10-CM | POA: Diagnosis not present

## 2017-01-13 DIAGNOSIS — R531 Weakness: Secondary | ICD-10-CM

## 2017-01-13 DIAGNOSIS — Z86718 Personal history of other venous thrombosis and embolism: Secondary | ICD-10-CM

## 2017-01-13 DIAGNOSIS — Z87891 Personal history of nicotine dependence: Secondary | ICD-10-CM

## 2017-01-13 DIAGNOSIS — I509 Heart failure, unspecified: Secondary | ICD-10-CM | POA: Diagnosis not present

## 2017-01-13 DIAGNOSIS — Z79899 Other long term (current) drug therapy: Secondary | ICD-10-CM

## 2017-01-13 DIAGNOSIS — R599 Enlarged lymph nodes, unspecified: Secondary | ICD-10-CM | POA: Diagnosis not present

## 2017-01-13 DIAGNOSIS — Z90722 Acquired absence of ovaries, bilateral: Secondary | ICD-10-CM

## 2017-01-13 LAB — COMPREHENSIVE METABOLIC PANEL
ALT: 11 U/L — ABNORMAL LOW (ref 14–54)
ANION GAP: 14 (ref 5–15)
AST: 24 U/L (ref 15–41)
Albumin: 2.1 g/dL — ABNORMAL LOW (ref 3.5–5.0)
Alkaline Phosphatase: 117 U/L (ref 38–126)
BILIRUBIN TOTAL: 0.4 mg/dL (ref 0.3–1.2)
BUN: 10 mg/dL (ref 6–20)
CHLORIDE: 94 mmol/L — AB (ref 101–111)
CO2: 24 mmol/L (ref 22–32)
Calcium: 8 mg/dL — ABNORMAL LOW (ref 8.9–10.3)
Creatinine, Ser: 0.86 mg/dL (ref 0.44–1.00)
Glucose, Bld: 117 mg/dL — ABNORMAL HIGH (ref 65–99)
POTASSIUM: 3.3 mmol/L — AB (ref 3.5–5.1)
Sodium: 132 mmol/L — ABNORMAL LOW (ref 135–145)
TOTAL PROTEIN: 6.1 g/dL — AB (ref 6.5–8.1)

## 2017-01-13 LAB — CBC WITH DIFFERENTIAL/PLATELET
BASOS ABS: 0.1 10*3/uL (ref 0–0.1)
Basophils Relative: 1 %
Eosinophils Absolute: 0 10*3/uL (ref 0–0.7)
Eosinophils Relative: 0 %
HEMATOCRIT: 33.9 % — AB (ref 35.0–47.0)
Hemoglobin: 11.2 g/dL — ABNORMAL LOW (ref 12.0–16.0)
LYMPHS PCT: 19 %
Lymphs Abs: 2.2 10*3/uL (ref 1.0–3.6)
MCH: 29.9 pg (ref 26.0–34.0)
MCHC: 33.1 g/dL (ref 32.0–36.0)
MCV: 90.3 fL (ref 80.0–100.0)
MONO ABS: 0 10*3/uL — AB (ref 0.2–0.9)
MONOS PCT: 0 %
NEUTROS ABS: 9.4 10*3/uL — AB (ref 1.4–6.5)
Neutrophils Relative %: 80 %
PLATELETS: 354 10*3/uL (ref 150–440)
RBC: 3.75 MIL/uL — ABNORMAL LOW (ref 3.80–5.20)
RDW: 16.9 % — AB (ref 11.5–14.5)
WBC: 11.7 10*3/uL — ABNORMAL HIGH (ref 3.6–11.0)

## 2017-01-19 DIAGNOSIS — R600 Localized edema: Secondary | ICD-10-CM | POA: Diagnosis not present

## 2017-01-25 NOTE — Progress Notes (Signed)
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  INTERVAL HISTORY: Patient returns to clinic today for further evaluation and consideration of cycle 2 of cycle 1 of carboplatinum and Taxol. She currently feels well and at her baseline.  Her pain has resolved and she is no longer taking oxycodone.The swelling of her left lower extremity is unchanged. She does not complain of constipation today. She has chronic weakness and fatigue. She has no neurologic complaints. She denies any recent fevers or illnesses. Her appetite has improved and her weight is stable. She denies any chest pain or shortness of breath. She has no nausea and she has no urinary complaints. Patient offers no further specific complaints.  REVIEW OF SYSTEMS:   Review of Systems  Constitutional: Positive for malaise/fatigue. Negative for fever and weight loss.  Respiratory: Negative.  Negative for cough and shortness of breath.   Cardiovascular: Positive for leg swelling. Negative for chest pain.  Gastrointestinal: Negative for abdominal pain, constipation, nausea and vomiting.  Genitourinary: Negative.   Musculoskeletal: Negative.   Skin: Negative.  Negative for rash.  Neurological: Positive for weakness.  Psychiatric/Behavioral: Negative.  The patient is not nervous/anxious.     As per HPI. Otherwise, a complete review of systems is negative.  PAST MEDICAL HISTORY: Past Medical History:  Diagnosis Date  . Arthritis   . CHF (congestive heart failure) ([place])   . Constipation   . DVT (deep venous thrombosis) ([place])   . Endometrial cancer ([place]) 01/28/2016  . Hyperlipidemia   . Hypertension   . Hypothyroidism     PAST SURGICAL HISTORY: Past Surgical  History:  Procedure Laterality Date  . DILATION AND CURETTAGE OF UTERUS    . TONSILLECTOMY      FAMILY HISTORY: Family History  Problem Relation Age of Onset  . Diabetes Father   . Heart disease Father   . Hypertension Father   . Diabetes Paternal Grandmother   . Heart disease Mother   . Hypertension Mother   . Stroke Mother   . Hypertension Sister   . Thyroid disease Sister   . Hypertension Brother     ADVANCED DIRECTIVES (Y/N):  N  HEALTH MAINTENANCE: Social History   Tobacco Use  . Smoking status: Former Research scientist (life sciences)  . Smokeless tobacco: Never Used  Substance Use Topics  . Alcohol use: No  . Drug use: No     Colonoscopy:  PAP:  Bone density:  Lipid panel:  Allergies  Allergen Reactions  . Sulfa Antibiotics Rash  . Zinc Rash    Current Outpatient Medications  Medication Sig Dispense Refill  . acetaminophen (TYLENOL) 325 MG tablet Take 650 mg by mouth every 6 (six) hours as needed for moderate pain.    . cephALEXin (KEFLEX) 500 MG capsule Take 1 capsule (500 mg total) by mouth 3 (three) times daily. 6 capsule 0  . [employer]S MELATONIN 3 MG TABS TAKE 1 TABLET BY MOUTH AT BEDTIME FOR INSOMNIA  0  . CVS VITAMIN B-12 500 MCG tablet Take 1,000 mcg by mouth daily.  0  . ELIQUIS 5 MG TABS tablet Take 5 mg by mouth 2 (two) times daily.  0  . fentaNYL (DURAGESIC - DOSED MCG/HR) 50 MCG/HR Place 1 patch (50 mcg total) onto the skin every 3 (three) days. 10  patch 0  . folic acid (FOLVITE) 1 MG tablet Take 1 mg by mouth daily.  0  . furosemide (LASIX) 20 MG tablet Take 20 mg by mouth 2 (two) times daily.  0  . KLOR-CON 10 10 MEQ tablet Take 10 mEq by mouth daily. with food  0  . levothyroxine (SYNTHROID, LEVOTHROID) 100 MCG tablet Take 100 mcg by mouth daily before breakfast.    . lidocaine (LIDODERM) 5 % APPLY TO PAINFUL AREAS ONCE A DAY FOR 12 HOURS ON AND 12 HOURS OFF  0  . nystatin cream (MYCOSTATIN) APPLY TO AFFECTED AREA TWICE A DAY  0  . ondansetron (ZOFRAN) 8 MG tablet Take  1 tablet (8 mg total) by mouth 2 (two) times daily as needed for refractory nausea / vomiting. Start on day 3 after chemo. 60 tablet 2  . Oxycodone HCl 10 MG TABS Take 1 tablet (10 mg total) by mouth every 4 (four) hours as needed. 60 tablet 0  . prochlorperazine (COMPAZINE) 10 MG tablet Take 1 tablet (10 mg total) by mouth every 6 (six) hours as needed (Nausea or vomiting). 60 tablet 2  . senna (SENOKOT) 8.6 MG tablet Take 2 tablets by mouth 2 (two) times daily.     No current facility-administered medications for this visit.    Facility-Administered Medications Ordered in Other Visits  Medication Dose Route Frequency Provider Last Rate Last Dose  . CARBOplatin (PARAPLATIN) 280 mg in sodium chloride 0.9 % 250 mL chemo infusion  280 mg Intravenous Once [name], MD      . pegfilgrastim (NEULASTA ONPRO KIT) injection 6 mg  6 mg Subcutaneous Once [name], MD        OBJECTIVE: Vitals:   01/27/17 0901  BP: 113/78  Pulse: 81  Resp: 18  Temp: 99.1 F (37.3 C)     Body mass index is 27.72 kg/m.    ECOG FS:1 - Symptomatic but completely ambulatory  General: Ill-appearing, sitting in a wheelchair, no acute distress. Eyes: Pink conjunctiva, anicteric sclera. Lungs: Clear to auscultation bilaterally. Heart: Regular rate and rhythm. No rubs, murmurs, or gallops. Abdomen: Soft, nontender, nondistended. No organomegaly noted, normoactive bowel sounds. Musculoskeletal: 3-4+ left leg lymphedema.  Neuro: Alert, answering all questions appropriately. Cranial nerves grossly intact. Skin: No rashes or petechiae noted. Psych: Normal affect.   LAB RESULTS:  Lab Results  Component Value Date   NA 133 (L) 01/27/2017   K 3.5 01/27/2017   CL 100 (L) 01/27/2017   CO2 24 01/27/2017   GLUCOSE 91 01/27/2017   BUN 10 01/27/2017   CREATININE 0.62 01/27/2017   CALCIUM 8.0 (L) 01/27/2017   PROT 6.1 (L) 01/27/2017   ALBUMIN 1.9 (L) 01/27/2017   AST 25 01/27/2017   ALT 10 (L)  01/27/2017   ALKPHOS 98 01/27/2017   BILITOT 0.4 01/27/2017   GFRNONAA >60 01/27/2017   GFRAA >60 01/27/2017    Lab Results  Component Value Date   WBC 8.5 01/27/2017   NEUTROABS 5.7 01/27/2017   HGB 11.2 (L) 01/27/2017   HCT 33.1 (L) 01/27/2017   MCV 91.4 01/27/2017   PLT 494 (H) 01/27/2017     STUDIES: No results found.  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  PLAN:    1. 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 Adjuvant treatment was recommended at that time, but patient refused on multiple occations and missed multiple follow-up appointments in  the interim. She has now agreed to attempt adjuvant chemotherapy using carboplatinum and Taxol every 3 weeks with Neulasta support. Patient continues to decline port placement.  Carboplatinum and Taxol were dose reduced from the onset given her decreased performance status.  Proceed with cycle 2 of 4 of carboplatinum and Taxol today.  Return to clinic in 3 weeks for consideration of cycle 3.  Will reimage at the conclusion of cycle 4 with up to 6 treatments planned  2. Pain: Resolved.  Patient is no longer taking narcotics.   3. Hypokalemia: Improved.  Continue oral supplementation.  Approximately 30 minutes was spent in discussion of which greater than 50% was consultation.  Patient expressed understanding and was in agreement with this plan. She also understands that She can call clinic at any time with any questions, concerns, or complaints.   Cancer Staging No matching staging information was found for the patient.   [name], MD   01/27/2017 2:12 PM

## 2017-01-27 ENCOUNTER — Inpatient Hospital Stay (HOSPITAL_BASED_OUTPATIENT_CLINIC_OR_DEPARTMENT_OTHER): Payer: Medicare Other | Admitting: Oncology

## 2017-01-27 ENCOUNTER — Inpatient Hospital Stay: Payer: Medicare Other | Attending: Oncology

## 2017-01-27 ENCOUNTER — Inpatient Hospital Stay: Payer: Medicare Other

## 2017-01-27 VITALS — BP 113/78 | HR 81 | Temp 99.1°F | Resp 18 | Wt 149.1 lb

## 2017-01-27 DIAGNOSIS — Z7901 Long term (current) use of anticoagulants: Secondary | ICD-10-CM | POA: Insufficient documentation

## 2017-01-27 DIAGNOSIS — Z5111 Encounter for antineoplastic chemotherapy: Secondary | ICD-10-CM | POA: Insufficient documentation

## 2017-01-27 DIAGNOSIS — Z87891 Personal history of nicotine dependence: Secondary | ICD-10-CM

## 2017-01-27 DIAGNOSIS — C541 Malignant neoplasm of endometrium: Secondary | ICD-10-CM

## 2017-01-27 DIAGNOSIS — E785 Hyperlipidemia, unspecified: Secondary | ICD-10-CM | POA: Diagnosis not present

## 2017-01-27 DIAGNOSIS — Z7689 Persons encountering health services in other specified circumstances: Secondary | ICD-10-CM | POA: Diagnosis not present

## 2017-01-27 DIAGNOSIS — I890 Lymphedema, not elsewhere classified: Secondary | ICD-10-CM | POA: Insufficient documentation

## 2017-01-27 DIAGNOSIS — I509 Heart failure, unspecified: Secondary | ICD-10-CM | POA: Insufficient documentation

## 2017-01-27 DIAGNOSIS — M129 Arthropathy, unspecified: Secondary | ICD-10-CM

## 2017-01-27 DIAGNOSIS — E039 Hypothyroidism, unspecified: Secondary | ICD-10-CM | POA: Diagnosis not present

## 2017-01-27 DIAGNOSIS — I10 Essential (primary) hypertension: Secondary | ICD-10-CM | POA: Insufficient documentation

## 2017-01-27 DIAGNOSIS — E876 Hypokalemia: Secondary | ICD-10-CM

## 2017-01-27 DIAGNOSIS — Z79899 Other long term (current) drug therapy: Secondary | ICD-10-CM | POA: Insufficient documentation

## 2017-01-27 LAB — CBC WITH DIFFERENTIAL/PLATELET
BASOS ABS: 0 10*3/uL (ref 0–0.1)
Basophils Relative: 0 %
EOS PCT: 1 %
Eosinophils Absolute: 0 10*3/uL (ref 0–0.7)
HEMATOCRIT: 33.1 % — AB (ref 35.0–47.0)
Hemoglobin: 11.2 g/dL — ABNORMAL LOW (ref 12.0–16.0)
LYMPHS PCT: 26 %
Lymphs Abs: 2.2 10*3/uL (ref 1.0–3.6)
MCH: 30.8 pg (ref 26.0–34.0)
MCHC: 33.7 g/dL (ref 32.0–36.0)
MCV: 91.4 fL (ref 80.0–100.0)
MONO ABS: 0.6 10*3/uL (ref 0.2–0.9)
MONOS PCT: 7 %
NEUTROS ABS: 5.7 10*3/uL (ref 1.4–6.5)
Neutrophils Relative %: 66 %
PLATELETS: 494 10*3/uL — AB (ref 150–440)
RBC: 3.63 MIL/uL — ABNORMAL LOW (ref 3.80–5.20)
RDW: 18.5 % — AB (ref 11.5–14.5)
WBC: 8.5 10*3/uL (ref 3.6–11.0)

## 2017-01-27 LAB — COMPREHENSIVE METABOLIC PANEL
ALBUMIN: 1.9 g/dL — AB (ref 3.5–5.0)
ALK PHOS: 98 U/L (ref 38–126)
ALT: 10 U/L — ABNORMAL LOW (ref 14–54)
ANION GAP: 9 (ref 5–15)
AST: 25 U/L (ref 15–41)
BILIRUBIN TOTAL: 0.4 mg/dL (ref 0.3–1.2)
BUN: 10 mg/dL (ref 6–20)
CO2: 24 mmol/L (ref 22–32)
Calcium: 8 mg/dL — ABNORMAL LOW (ref 8.9–10.3)
Chloride: 100 mmol/L — ABNORMAL LOW (ref 101–111)
Creatinine, Ser: 0.62 mg/dL (ref 0.44–1.00)
GFR calc Af Amer: >60 mL/min (ref >60)
GFR calc non Af Amer: >60 mL/min (ref >60)
GLUCOSE: 91 mg/dL (ref 65–99)
POTASSIUM: 3.5 mmol/L (ref 3.5–5.1)
SODIUM: 133 mmol/L — AB (ref 135–145)
TOTAL PROTEIN: 6.1 g/dL — AB (ref 6.5–8.1)

## 2017-01-27 MED ORDER — PEGFILGRASTIM 6 MG/0.6ML ~~LOC~~ PSKT
6.0000 mg | PREFILLED_SYRINGE | Freq: Once | SUBCUTANEOUS | Status: AC
  Administered 2017-01-27: 6 mg via SUBCUTANEOUS
  Filled 2017-01-27: qty 0.6

## 2017-01-27 MED ORDER — SODIUM CHLORIDE 0.9 % IV SOLN: 10.0000 mg | Freq: Once | INTRAVENOUS | Status: DC

## 2017-01-27 MED ORDER — DIPHENHYDRAMINE HCL 50 MG/ML IJ SOLN
25.0000 mg | Freq: Once | INTRAMUSCULAR | Status: AC
  Administered 2017-01-27: 25 mg via INTRAVENOUS
  Filled 2017-01-27: qty 1

## 2017-01-27 MED ORDER — PALONOSETRON HCL INJECTION 0.25 MG/5ML
0.2500 mg | Freq: Once | INTRAVENOUS | Status: AC
  Administered 2017-01-27: 0.25 mg via INTRAVENOUS
  Filled 2017-01-27: qty 5

## 2017-01-27 MED ORDER — SODIUM CHLORIDE 0.9 % IV SOLN
Freq: Once | INTRAVENOUS | Status: AC
  Administered 2017-01-27: 10:00:00 via INTRAVENOUS
  Filled 2017-01-27: qty 1000

## 2017-01-27 MED ORDER — FAMOTIDINE IN NACL 20-0.9 MG/50ML-% IV SOLN
20.0000 mg | Freq: Once | INTRAVENOUS | Status: AC
  Administered 2017-01-27: 20 mg via INTRAVENOUS
  Filled 2017-01-27: qty 50

## 2017-01-27 MED ORDER — SODIUM CHLORIDE 0.9 % IV SOLN
280.0000 mg | Freq: Once | INTRAVENOUS | Status: AC
  Administered 2017-01-27: 280 mg via INTRAVENOUS
  Filled 2017-01-27: qty 28

## 2017-01-27 MED ORDER — DEXAMETHASONE SODIUM PHOSPHATE 10 MG/ML IJ SOLN
10.0000 mg | Freq: Once | INTRAMUSCULAR | Status: AC
  Administered 2017-01-27: 10 mg via INTRAVENOUS
  Filled 2017-01-27: qty 1

## 2017-01-27 MED ORDER — PACLITAXEL CHEMO INJECTION 300 MG/50ML
150.0000 mg/m2 | Freq: Once | INTRAVENOUS | Status: AC
  Administered 2017-01-27: 258 mg via INTRAVENOUS
  Filled 2017-01-27: qty 43

## 2017-02-02 DIAGNOSIS — R600 Localized edema: Secondary | ICD-10-CM | POA: Diagnosis not present

## 2017-02-16 NOTE — Progress Notes (Signed)
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  INTERVAL HISTORY: Patient returns to clinic today for further evaluation and consideration of cycle 3 of carboplatinum and Taxol. She currently feels well and at her baseline.  Her pain has resolved and she is no longer taking oxycodone.The swelling of her left lower extremity has improved, but still significant. She does not complain of constipation today. She has chronic weakness and fatigue. She has no neurologic complaints. She denies any recent fevers or illnesses. Her appetite has improved and her weight is stable. She denies any chest pain or shortness of breath. She has no nausea and she has no urinary complaints. Patient offers no further specific complaints.  REVIEW OF SYSTEMS:   Review of Systems  Constitutional: Positive for malaise/fatigue. Negative for fever and weight loss.  Respiratory: Negative.  Negative for cough and shortness of breath.   Cardiovascular: Positive for leg swelling. Negative for chest pain.  Gastrointestinal: Negative for abdominal pain, constipation, nausea and vomiting.  Genitourinary: Negative.   Musculoskeletal: Negative.   Skin: Negative.  Negative for rash.  Neurological: Positive for weakness.  Psychiatric/Behavioral: Negative.  The patient is not nervous/anxious.     As per HPI. Otherwise, a complete review of systems is negative.  PAST MEDICAL HISTORY: Past Medical History:  Diagnosis Date  . Arthritis   . CHF (congestive heart failure) ([place])   . Constipation   . DVT (deep venous thrombosis) ([place])   . Endometrial cancer ([place]) 01/28/2016  . Hyperlipidemia   . Hypertension   . Hypothyroidism     PAST SURGICAL  HISTORY: Past Surgical History:  Procedure Laterality Date  . CYSTOSCOPY W/ RETROGRADES Left 01/01/2017   Procedure: CYSTOSCOPY WITH RETROGRADE PYELOGRAM;  Surgeon: Nickie Retort, MD;  Location: [facility] OR[facility];  Service: Urology;  Laterality: Left;  . CYSTOSCOPY WITH STENT PLACEMENT Left 01/01/2017   Procedure: CYSTOSCOPY WITH STENT PLACEMENT;  Surgeon: Nickie Retort, MD;  Location: [facility] OR[facility];  Service: Urology;  Laterality: Left;  . DILATION AND CURETTAGE OF UTERUS    . HYSTEROSCOPY W/D&C N/A 01/28/2016   Procedure: DILATATION AND CURETTAGE /HYSTEROSCOPY;  Surgeon: [name], MD;  Location: [facility] OR[facility];  Service: Gynecology;  Laterality: N/A;  . LAPAROSCOPIC BILATERAL SALPINGO OOPHERECTOMY Bilateral 02/26/2016   Procedure: LAPAROSCOPIC BILATERAL SALPINGO OOPHORECTOMY;  Surgeon: [name], MD;  Location: [facility];  Service: Gynecology;  Laterality: Bilateral;  . LAPAROSCOPIC HYSTERECTOMY N/A 02/26/2016   Procedure: HYSTERECTOMY TOTAL LAPAROSCOPIC;  Surgeon: [name], MD;  Location: [facility];  Service: Gynecology;  Laterality: N/A;  . SENTINEL NODE BIOPSY N/A 02/26/2016   Procedure: SENTINEL NODE BIOPSY;  Surgeon: [name], MD;  Location: [facility];  Service: Gynecology;  Laterality: N/A;  . TONSILLECTOMY      FAMILY HISTORY: Family History  Problem Relation Age of Onset  . Diabetes Father   . Heart disease Father   . Hypertension Father   . Diabetes Paternal Grandmother   . Heart disease Mother   . Hypertension Mother   . Stroke Mother   . Hypertension Sister   . Thyroid disease Sister   . Hypertension Brother     ADVANCED DIRECTIVES (Y/N):  N  HEALTH MAINTENANCE: Social History   Tobacco Use  . Smoking status: Former Research scientist (life sciences)  .  Smokeless tobacco: Never Used  Substance Use Topics  . Alcohol use: No  . Drug use: No     Colonoscopy:  PAP:  Bone density:  Lipid panel:  Allergies  Allergen Reactions  . Sulfa Antibiotics Rash  . Zinc Rash     Current Outpatient Medications  Medication Sig Dispense Refill  . acetaminophen (TYLENOL) 325 MG tablet Take 650 mg by mouth every 6 (six) hours as needed for moderate pain.    . CVS MELATONIN 3 MG TABS TAKE 1 TABLET BY MOUTH AT BEDTIME FOR INSOMNIA  0  . ELIQUIS 5 MG TABS tablet Take 5 mg by mouth 2 (two) times daily.  0  . fentaNYL (DURAGESIC - DOSED MCG/HR) 50 MCG/HR Place 1 patch (50 mcg total) onto the skin every 3 (three) days. 10 patch 0  . furosemide (LASIX) 20 MG tablet Take 20 mg by mouth 2 (two) times daily.  0  . KLOR-CON 10 10 MEQ tablet Take 10 mEq by mouth daily. with food  0  . levothyroxine (SYNTHROID, LEVOTHROID) 100 MCG tablet Take 100 mcg by mouth daily before breakfast.    . lidocaine (LIDODERM) 5 % APPLY TO PAINFUL AREAS ONCE A DAY FOR 12 HOURS ON AND 12 HOURS OFF  0  . Oxycodone HCl 10 MG TABS Take 1 tablet (10 mg total) by mouth every 4 (four) hours as needed. 60 tablet 0  . senna (SENOKOT) 8.6 MG tablet Take 2 tablets by mouth 2 (two) times daily.    . CVS VITAMIN B-12 500 MCG tablet Take 1,000 mcg by mouth daily.  0  . folic acid (FOLVITE) 1 MG tablet Take 1 mg by mouth daily.  0  . ondansetron (ZOFRAN) 8 MG tablet Take 1 tablet (8 mg total) by mouth 2 (two) times daily as needed for refractory nausea / vomiting. Start on day 3 after chemo. (Patient not taking: Reported on 02/17/2017) 60 tablet 2  . prochlorperazine (COMPAZINE) 10 MG tablet Take 1 tablet (10 mg total) by mouth every 6 (six) hours as needed (Nausea or vomiting). (Patient not taking: Reported on 02/17/2017) 60 tablet 2   No current facility-administered medications for this visit.     OBJECTIVE: Vitals:   02/17/17 1000  BP: 128/81  Pulse: 80  Resp: 18  Temp: 99.1 F (37.3 C)     Body mass index is 26.79 kg/m.    ECOG FS:1 - Symptomatic but completely ambulatory  General: Ill-appearing, sitting in a wheelchair, no acute distress. Eyes: Barton conjunctiva, anicteric sclera. Lungs: Clear to  auscultation bilaterally. Heart: Regular rate and rhythm. No rubs, murmurs, or gallops. Abdomen: Soft, nontender, nondistended. No organomegaly noted, normoactive bowel sounds. Musculoskeletal: 3+ left leg lymphedema.  Neuro: Alert, answering all questions appropriately. Cranial nerves grossly intact. Skin: No rashes or petechiae noted. Psych: Normal affect.   LAB RESULTS:  Lab Results  Component Value Date   NA 134 (L) 02/17/2017   K 3.7 02/17/2017   CL 100 (L) 02/17/2017   CO2 25 02/17/2017   GLUCOSE 120 (H) 02/17/2017   BUN 12 02/17/2017   CREATININE 0.65 02/17/2017   CALCIUM 8.3 (L) 02/17/2017   PROT 6.3 (L) 02/17/2017   ALBUMIN 2.4 (L) 02/17/2017   AST 23 02/17/2017   ALT 9 (L) 02/17/2017   ALKPHOS 72 02/17/2017   BILITOT 0.4 02/17/2017   GFRNONAA >60 02/17/2017   GFRAA >60 02/17/2017    Lab Results  Component Value Date   WBC 7.1 02/17/2017   NEUTROABS  4.5 02/17/2017   HGB 10.6 (L) 02/17/2017   HCT 31.4 (L) 02/17/2017   MCV 94.8 02/17/2017   PLT 366 02/17/2017     STUDIES: No results found.  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  PLAN:    1. 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 Adjuvant treatment was recommended at that time, but patient refused on multiple occations and missed multiple follow-up appointments in the interim. She has now agreed to attempt adjuvant chemotherapy using carboplatinum and Taxol every 3 weeks with Neulasta support. Patient continues to decline port placement.  Carboplatinum and Taxol were dose reduced from the onset given her decreased performance status.  Proceed with cycle 3 of 4 of carboplatinum and Taxol today.  Return to clinic in 3 weeks for consideration of cycle 4.  Will reimage at the conclusion of cycle 4 with up to 6 treatments planned  2. Pain: Resolved.  Patient is no longer taking  narcotics.   3. Hypokalemia: Improved.  Continue oral supplementation. 4. Lymphedema: Improving, monitor.  Approximately 30 minutes was spent in discussion of which greater than 50% was consultation.  Patient expressed understanding and was in agreement with this plan. She also understands that She can call clinic at any time with any questions, concerns, or complaints.   Cancer Staging No matching staging information was found for the patient.   [name], MD   02/17/2017 10:04 AM

## 2017-02-17 ENCOUNTER — Inpatient Hospital Stay: Payer: Medicare Other

## 2017-02-17 ENCOUNTER — Inpatient Hospital Stay (HOSPITAL_BASED_OUTPATIENT_CLINIC_OR_DEPARTMENT_OTHER): Payer: Medicare Other | Admitting: Oncology

## 2017-02-17 VITALS — BP 128/81 | HR 80 | Temp 99.1°F | Resp 18 | Wt 144.1 lb

## 2017-02-17 DIAGNOSIS — I890 Lymphedema, not elsewhere classified: Secondary | ICD-10-CM

## 2017-02-17 DIAGNOSIS — Z87891 Personal history of nicotine dependence: Secondary | ICD-10-CM

## 2017-02-17 DIAGNOSIS — M129 Arthropathy, unspecified: Secondary | ICD-10-CM | POA: Diagnosis not present

## 2017-02-17 DIAGNOSIS — E876 Hypokalemia: Secondary | ICD-10-CM

## 2017-02-17 DIAGNOSIS — Z7901 Long term (current) use of anticoagulants: Secondary | ICD-10-CM | POA: Diagnosis not present

## 2017-02-17 DIAGNOSIS — Z79899 Other long term (current) drug therapy: Secondary | ICD-10-CM

## 2017-02-17 DIAGNOSIS — E785 Hyperlipidemia, unspecified: Secondary | ICD-10-CM | POA: Diagnosis not present

## 2017-02-17 DIAGNOSIS — C541 Malignant neoplasm of endometrium: Secondary | ICD-10-CM | POA: Diagnosis not present

## 2017-02-17 DIAGNOSIS — I10 Essential (primary) hypertension: Secondary | ICD-10-CM | POA: Diagnosis not present

## 2017-02-17 DIAGNOSIS — I509 Heart failure, unspecified: Secondary | ICD-10-CM | POA: Diagnosis not present

## 2017-02-17 DIAGNOSIS — Z7689 Persons encountering health services in other specified circumstances: Secondary | ICD-10-CM | POA: Diagnosis not present

## 2017-02-17 DIAGNOSIS — E039 Hypothyroidism, unspecified: Secondary | ICD-10-CM

## 2017-02-17 DIAGNOSIS — Z5111 Encounter for antineoplastic chemotherapy: Secondary | ICD-10-CM | POA: Diagnosis not present

## 2017-02-17 LAB — COMPREHENSIVE METABOLIC PANEL
ALK PHOS: 72 U/L (ref 38–126)
ALT: 9 U/L — AB (ref 14–54)
AST: 23 U/L (ref 15–41)
Albumin: 2.4 g/dL — ABNORMAL LOW (ref 3.5–5.0)
Anion gap: 9 (ref 5–15)
BUN: 12 mg/dL (ref 6–20)
CALCIUM: 8.3 mg/dL — AB (ref 8.9–10.3)
CHLORIDE: 100 mmol/L — AB (ref 101–111)
CO2: 25 mmol/L (ref 22–32)
CREATININE: 0.65 mg/dL (ref 0.44–1.00)
GFR calc Af Amer: >60 mL/min (ref >60)
Glucose, Bld: 120 mg/dL — ABNORMAL HIGH (ref 65–99)
Potassium: 3.7 mmol/L (ref 3.5–5.1)
SODIUM: 134 mmol/L — AB (ref 135–145)
Total Bilirubin: 0.4 mg/dL (ref 0.3–1.2)
Total Protein: 6.3 g/dL — ABNORMAL LOW (ref 6.5–8.1)

## 2017-02-17 LAB — CBC WITH DIFFERENTIAL/PLATELET
BASOS PCT: 0 %
Basophils Absolute: 0 10*3/uL (ref 0–0.1)
EOS ABS: 0 10*3/uL (ref 0–0.7)
EOS PCT: 0 %
HCT: 31.4 % — ABNORMAL LOW (ref 35.0–47.0)
Hemoglobin: 10.6 g/dL — ABNORMAL LOW (ref 12.0–16.0)
LYMPHS ABS: 2.1 10*3/uL (ref 1.0–3.6)
Lymphocytes Relative: 29 %
MCH: 31.9 pg (ref 26.0–34.0)
MCHC: 33.6 g/dL (ref 32.0–36.0)
MCV: 94.8 fL (ref 80.0–100.0)
MONOS PCT: 7 %
Monocytes Absolute: 0.5 10*3/uL (ref 0.2–0.9)
Neutro Abs: 4.5 10*3/uL (ref 1.4–6.5)
Neutrophils Relative %: 64 %
PLATELETS: 366 10*3/uL (ref 150–440)
RBC: 3.31 MIL/uL — ABNORMAL LOW (ref 3.80–5.20)
RDW: 19.4 % — AB (ref 11.5–14.5)
WBC: 7.1 10*3/uL (ref 3.6–11.0)

## 2017-02-17 MED ORDER — PEGFILGRASTIM 6 MG/0.6ML ~~LOC~~ PSKT
6.0000 mg | PREFILLED_SYRINGE | Freq: Once | SUBCUTANEOUS | Status: AC
  Administered 2017-02-17: 6 mg via SUBCUTANEOUS
  Filled 2017-02-17: qty 0.6

## 2017-02-17 MED ORDER — DIPHENHYDRAMINE HCL 50 MG/ML IJ SOLN
25.0000 mg | Freq: Once | INTRAMUSCULAR | Status: AC
  Administered 2017-02-17: 25 mg via INTRAVENOUS
  Filled 2017-02-17: qty 1

## 2017-02-17 MED ORDER — PALONOSETRON HCL INJECTION 0.25 MG/5ML
0.2500 mg | Freq: Once | INTRAVENOUS | Status: AC
  Administered 2017-02-17: 0.25 mg via INTRAVENOUS
  Filled 2017-02-17: qty 5

## 2017-02-17 MED ORDER — SODIUM CHLORIDE 0.9 % IV SOLN: 10.0000 mg | Freq: Once | INTRAVENOUS | Status: DC

## 2017-02-17 MED ORDER — FENTANYL 50 MCG/HR TD PT72: 50.0000 ug | MEDICATED_PATCH | TRANSDERMAL | 0 refills | Status: DC

## 2017-02-17 MED ORDER — SODIUM CHLORIDE 0.9 % IV SOLN
280.0000 mg | Freq: Once | INTRAVENOUS | Status: AC
  Administered 2017-02-17: 280 mg via INTRAVENOUS
  Filled 2017-02-17: qty 28

## 2017-02-17 MED ORDER — DEXAMETHASONE SODIUM PHOSPHATE 10 MG/ML IJ SOLN
10.0000 mg | Freq: Once | INTRAMUSCULAR | Status: AC
  Administered 2017-02-17: 10 mg via INTRAVENOUS
  Filled 2017-02-17: qty 1

## 2017-02-17 MED ORDER — FAMOTIDINE IN NACL 20-0.9 MG/50ML-% IV SOLN
20.0000 mg | Freq: Once | INTRAVENOUS | Status: AC
  Administered 2017-02-17: 20 mg via INTRAVENOUS
  Filled 2017-02-17: qty 50

## 2017-02-17 MED ORDER — SODIUM CHLORIDE 0.9 % IV SOLN
Freq: Once | INTRAVENOUS | Status: AC
  Administered 2017-02-17: 11:00:00 via INTRAVENOUS
  Filled 2017-02-17: qty 1000

## 2017-02-17 MED ORDER — PACLITAXEL CHEMO INJECTION 300 MG/50ML
150.0000 mg/m2 | Freq: Once | INTRAVENOUS | Status: AC
  Administered 2017-02-17: 258 mg via INTRAVENOUS
  Filled 2017-02-17: qty 43

## 2017-02-18 ENCOUNTER — Telehealth: Payer: Self-pay | Admitting: Urology

## 2017-02-18 NOTE — Telephone Encounter (Signed)
Vickie Barton did surgery in October and pt has blood in urine for 2 days now.  She was told to call if this happens.  [phone]

## 2017-02-18 NOTE — Telephone Encounter (Signed)
Pt called back and said [place] told her to call immediately if she was having any problems.  She has transportation, if we could see her this afternoon.

## 2017-02-19 NOTE — Telephone Encounter (Signed)
Spoke with pt in reference gross hematuria. Reinforced with pt bleeding is common with a stent in place and to increase fluid in take. Pt voiced understanding.

## 2017-02-22 DIAGNOSIS — I10 Essential (primary) hypertension: Secondary | ICD-10-CM | POA: Diagnosis not present

## 2017-02-22 DIAGNOSIS — E039 Hypothyroidism, unspecified: Secondary | ICD-10-CM | POA: Diagnosis not present

## 2017-02-22 DIAGNOSIS — E785 Hyperlipidemia, unspecified: Secondary | ICD-10-CM | POA: Diagnosis not present

## 2017-02-25 DIAGNOSIS — D649 Anemia, unspecified: Secondary | ICD-10-CM | POA: Diagnosis not present

## 2017-02-25 DIAGNOSIS — I890 Lymphedema, not elsewhere classified: Secondary | ICD-10-CM | POA: Diagnosis not present

## 2017-02-25 DIAGNOSIS — E785 Hyperlipidemia, unspecified: Secondary | ICD-10-CM | POA: Diagnosis not present

## 2017-02-25 DIAGNOSIS — Z0000 Encounter for general adult medical examination without abnormal findings: Secondary | ICD-10-CM | POA: Diagnosis not present

## 2017-02-25 DIAGNOSIS — I10 Essential (primary) hypertension: Secondary | ICD-10-CM | POA: Diagnosis not present

## 2017-02-25 DIAGNOSIS — E039 Hypothyroidism, unspecified: Secondary | ICD-10-CM | POA: Diagnosis not present

## 2017-03-08 NOTE — Progress Notes (Signed)
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  INTERVAL HISTORY: Patient returns to clinic today for further evaluation and consideration of cycle 4 of carboplatinum and Taxol. She currently feels well and at her baseline.  Her pain has resolved and she is no longer taking oxycodone.The swelling of her left lower extremity continues to improve, but still significant. She does not complain of constipation today. She has chronic weakness and fatigue. She has no neurologic complaints. She denies any recent fevers or illnesses. Her appetite has improved and her weight is stable. She denies any chest pain or shortness of breath. She has no nausea and she has no urinary complaints. Patient offers no further specific complaints.  REVIEW OF SYSTEMS:   Review of Systems  Constitutional: Positive for malaise/fatigue. Negative for fever and weight loss.  Respiratory: Negative.  Negative for cough and shortness of breath.   Cardiovascular: Positive for leg swelling. Negative for chest pain.  Gastrointestinal: Negative for abdominal pain, constipation, nausea and vomiting.  Genitourinary: Negative.   Musculoskeletal: Negative.   Skin: Negative.  Negative for rash.  Neurological: Positive for weakness.  Psychiatric/Behavioral: Negative.  The patient is not nervous/anxious.     As per HPI. Otherwise, a complete review of systems is negative.  PAST MEDICAL HISTORY: Past Medical History:  Diagnosis Date  . Arthritis   . CHF (congestive heart failure) ([place])   . Constipation   . DVT (deep venous thrombosis) ([place])   . Endometrial cancer ([place]) 01/28/2016  . Hyperlipidemia   . Hypertension   . Hypothyroidism     PAST SURGICAL  HISTORY: Past Surgical History:  Procedure Laterality Date  . CYSTOSCOPY W/ RETROGRADES Left 01/01/2017   Procedure: CYSTOSCOPY WITH RETROGRADE PYELOGRAM;  Surgeon: Nickie Retort, MD;  Location: [facility];  Service: Urology;  Laterality: Left;  . CYSTOSCOPY WITH STENT PLACEMENT Left 01/01/2017   Procedure: CYSTOSCOPY WITH STENT PLACEMENT;  Surgeon: Nickie Retort, MD;  Location: [facility];  Service: Urology;  Laterality: Left;  . DILATION AND CURETTAGE OF UTERUS    . HYSTEROSCOPY W/D&C N/A 01/28/2016   Procedure: DILATATION AND CURETTAGE /HYSTEROSCOPY;  Surgeon: [name], MD;  Location: [facility];  Service: Gynecology;  Laterality: N/A;  . LAPAROSCOPIC BILATERAL SALPINGO OOPHERECTOMY Bilateral 02/26/2016   Procedure: LAPAROSCOPIC BILATERAL SALPINGO OOPHORECTOMY;  Surgeon: [name], MD;  Location: [facility];  Service: Gynecology;  Laterality: Bilateral;  . LAPAROSCOPIC HYSTERECTOMY N/A 02/26/2016   Procedure: HYSTERECTOMY TOTAL LAPAROSCOPIC;  Surgeon: [name], MD;  Location: [facility];  Service: Gynecology;  Laterality: N/A;  . SENTINEL NODE BIOPSY N/A 02/26/2016   Procedure: SENTINEL NODE BIOPSY;  Surgeon: [name], MD;  Location: [facility];  Service: Gynecology;  Laterality: N/A;  . TONSILLECTOMY      FAMILY HISTORY: Family History  Problem Relation Age of Onset  . Diabetes Father   . Heart disease Father   . Hypertension Father   . Diabetes Paternal Grandmother   . Heart disease Mother   . Hypertension Mother   . Stroke Mother   . Hypertension Sister   . Thyroid disease Sister   . Hypertension Brother     ADVANCED DIRECTIVES (Y/N):  N  HEALTH MAINTENANCE: Social History   Tobacco Use  . Smoking status: Former  Smoker  . Smokeless tobacco: Never Used  Substance Use Topics  . Alcohol use: No  . Drug use: No     Colonoscopy:  PAP:  Bone density:  Lipid panel:  Allergies  Allergen Reactions  . Sulfa Antibiotics Rash  . Zinc Rash     Current Outpatient Medications  Medication Sig Dispense Refill  . acetaminophen (TYLENOL) 325 MG tablet Take 650 mg by mouth every 6 (six) hours as needed for moderate pain.    . [employer] MELATONIN 3 MG TABS TAKE 1 TABLET BY MOUTH AT BEDTIME FOR INSOMNIA  0  . ELIQUIS 5 MG TABS tablet Take 5 mg by mouth 2 (two) times daily.  0  . fentaNYL (DURAGESIC - DOSED MCG/HR) 50 MCG/HR Place 1 patch (50 mcg total) onto the skin every 3 (three) days. 10 patch 0  . furosemide (LASIX) 20 MG tablet Take 20 mg by mouth 2 (two) times daily.  0  . levothyroxine (SYNTHROID, LEVOTHROID) 100 MCG tablet Take 100 mcg by mouth daily before breakfast.    . Oxycodone HCl 10 MG TABS Take 1 tablet (10 mg total) by mouth every 4 (four) hours as needed. 60 tablet 0  . senna (SENOKOT) 8.6 MG tablet Take 2 tablets by mouth 2 (two) times daily.    . cephALEXin (KEFLEX) 500 MG capsule Take 1 capsule (500 mg total) by mouth 3 (three) times daily. 21 capsule 0  . [employer] VITAMIN B-12 500 MCG tablet Take 1,000 mcg by mouth daily.  0  . folic acid (FOLVITE) 1 MG tablet Take 1 mg by mouth daily.  0  . KLOR-CON 10 10 MEQ tablet Take 10 mEq by mouth daily. with food  0  . lidocaine (LIDODERM) 5 % APPLY TO PAINFUL AREAS ONCE A DAY FOR 12 HOURS ON AND 12 HOURS OFF  0  . ondansetron (ZOFRAN) 8 MG tablet Take 1 tablet (8 mg total) by mouth 2 (two) times daily as needed for refractory nausea / vomiting. Start on day 3 after chemo. (Patient not taking: Reported on 02/17/2017) 60 tablet 2  . prochlorperazine (COMPAZINE) 10 MG tablet Take 1 tablet (10 mg total) by mouth every 6 (six) hours as needed (Nausea or vomiting). (Patient not taking: Reported on 02/17/2017) 60 tablet 2   No current facility-administered medications for this visit.     OBJECTIVE: Vitals:   03/10/17 0916  BP: 133/83  Pulse: 83  Resp: 18  Temp: 98.8 F (37.1 C)     Body mass index is 26.36 kg/m.    ECOG FS:1 - Symptomatic but completely ambulatory  General:  Ill-appearing, sitting in a wheelchair, no acute distress. Eyes: Pink conjunctiva, anicteric sclera. Lungs: Clear to auscultation bilaterally. Heart: Regular rate and rhythm. No rubs, murmurs, or gallops. Abdomen: Soft, nontender, nondistended. No organomegaly noted, normoactive bowel sounds. Musculoskeletal: 3+ left leg lymphedema.  Neuro: Alert, answering all questions appropriately. Cranial nerves grossly intact. Skin: No rashes or petechiae noted. Psych: Normal affect.   LAB RESULTS:  Lab Results  Component Value Date   NA 137 03/10/2017   K 3.0 (L) 03/10/2017   CL 99 (L) 03/10/2017   CO2 29 03/10/2017   GLUCOSE 109 (H) 03/10/2017   BUN 11 03/10/2017   CREATININE 0.67 03/10/2017   CALCIUM 8.4 (L) 03/10/2017   PROT 6.4 (L) 03/10/2017   ALBUMIN 2.5 (L) 03/10/2017   AST 20 03/10/2017   ALT 9 (L) 03/10/2017   ALKPHOS 84 03/10/2017   BILITOT 0.4 03/10/2017  GFRNONAA >60 03/10/2017   GFRAA >60 03/10/2017    Lab Results  Component Value Date   WBC 6.5 03/10/2017   NEUTROABS 4.2 03/10/2017   HGB 10.1 (L) 03/10/2017   HCT 30.1 (L) 03/10/2017   MCV 97.2 03/10/2017   PLT 327 03/10/2017     STUDIES: No results found.  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  PLAN:    1. 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 Adjuvant treatment was recommended at that time, but patient refused on multiple occations and missed multiple follow-up appointments in the interim. She has now agreed to attempt adjuvant chemotherapy using carboplatinum and Taxol every 3 weeks with Neulasta support. Patient continues to decline port placement.  Carboplatinum and Taxol were dose reduced from the onset given her decreased performance status.  Proceed with cycle 4 of carboplatinum and Taxol today.  Return to clinic in 3 weeks for consideration of cycle 5.  Will reimage  in 3 weeks prior to cycle 5 with up to 6 treatments planned  2. Pain: Resolved.  Patient is no longer taking narcotics.   3. Hypokalemia:  Continue oral supplementation. 4. Lymphedema: Improving, monitor. 5.  Anemia: Mild, monitor.  Approximately 30 minutes was spent in discussion of which greater than 50% was consultation.  Patient expressed understanding and was in agreement with this plan. She also understands that She can call clinic at any time with any questions, concerns, or complaints.   Cancer Staging No matching staging information was found for the patient.   [name], MD   03/12/2017 8:33 AM

## 2017-03-10 ENCOUNTER — Inpatient Hospital Stay: Payer: Medicare Other

## 2017-03-10 ENCOUNTER — Inpatient Hospital Stay: Payer: Medicare Other | Attending: Oncology

## 2017-03-10 ENCOUNTER — Inpatient Hospital Stay (HOSPITAL_BASED_OUTPATIENT_CLINIC_OR_DEPARTMENT_OTHER): Payer: Medicare Other | Admitting: Oncology

## 2017-03-10 VITALS — BP 133/83 | HR 83 | Temp 98.8°F | Resp 18 | Wt 141.8 lb

## 2017-03-10 DIAGNOSIS — I509 Heart failure, unspecified: Secondary | ICD-10-CM | POA: Diagnosis not present

## 2017-03-10 DIAGNOSIS — E039 Hypothyroidism, unspecified: Secondary | ICD-10-CM | POA: Insufficient documentation

## 2017-03-10 DIAGNOSIS — R5383 Other fatigue: Secondary | ICD-10-CM | POA: Diagnosis not present

## 2017-03-10 DIAGNOSIS — Z5111 Encounter for antineoplastic chemotherapy: Secondary | ICD-10-CM | POA: Diagnosis not present

## 2017-03-10 DIAGNOSIS — R531 Weakness: Secondary | ICD-10-CM | POA: Diagnosis not present

## 2017-03-10 DIAGNOSIS — E785 Hyperlipidemia, unspecified: Secondary | ICD-10-CM | POA: Diagnosis not present

## 2017-03-10 DIAGNOSIS — I10 Essential (primary) hypertension: Secondary | ICD-10-CM | POA: Insufficient documentation

## 2017-03-10 DIAGNOSIS — K5900 Constipation, unspecified: Secondary | ICD-10-CM | POA: Insufficient documentation

## 2017-03-10 DIAGNOSIS — Z7689 Persons encountering health services in other specified circumstances: Secondary | ICD-10-CM

## 2017-03-10 DIAGNOSIS — C541 Malignant neoplasm of endometrium: Secondary | ICD-10-CM | POA: Diagnosis not present

## 2017-03-10 DIAGNOSIS — M129 Arthropathy, unspecified: Secondary | ICD-10-CM | POA: Diagnosis not present

## 2017-03-10 DIAGNOSIS — M7989 Other specified soft tissue disorders: Secondary | ICD-10-CM | POA: Insufficient documentation

## 2017-03-10 LAB — COMPREHENSIVE METABOLIC PANEL
ALK PHOS: 84 U/L (ref 38–126)
ALT: 9 U/L — ABNORMAL LOW (ref 14–54)
ANION GAP: 9 (ref 5–15)
AST: 20 U/L (ref 15–41)
Albumin: 2.5 g/dL — ABNORMAL LOW (ref 3.5–5.0)
BUN: 11 mg/dL (ref 6–20)
CALCIUM: 8.4 mg/dL — AB (ref 8.9–10.3)
CO2: 29 mmol/L (ref 22–32)
Chloride: 99 mmol/L — ABNORMAL LOW (ref 101–111)
Creatinine, Ser: 0.67 mg/dL (ref 0.44–1.00)
Glucose, Bld: 109 mg/dL — ABNORMAL HIGH (ref 65–99)
Potassium: 3 mmol/L — ABNORMAL LOW (ref 3.5–5.1)
Sodium: 137 mmol/L (ref 135–145)
TOTAL PROTEIN: 6.4 g/dL — AB (ref 6.5–8.1)
Total Bilirubin: 0.4 mg/dL (ref 0.3–1.2)

## 2017-03-10 LAB — CBC WITH DIFFERENTIAL/PLATELET
BASOS ABS: 0.1 10*3/uL (ref 0–0.1)
BASOS PCT: 1 %
Eosinophils Absolute: 0 10*3/uL (ref 0–0.7)
Eosinophils Relative: 0 %
HEMATOCRIT: 30.1 % — AB (ref 35.0–47.0)
HEMOGLOBIN: 10.1 g/dL — AB (ref 12.0–16.0)
Lymphocytes Relative: 28 %
Lymphs Abs: 1.8 10*3/uL (ref 1.0–3.6)
MCH: 32.5 pg (ref 26.0–34.0)
MCHC: 33.4 g/dL (ref 32.0–36.0)
MCV: 97.2 fL (ref 80.0–100.0)
MONO ABS: 0.5 10*3/uL (ref 0.2–0.9)
Monocytes Relative: 7 %
NEUTROS ABS: 4.2 10*3/uL (ref 1.4–6.5)
NEUTROS PCT: 64 %
Platelets: 327 10*3/uL (ref 150–440)
RBC: 3.1 MIL/uL — AB (ref 3.80–5.20)
RDW: 18.9 % — AB (ref 11.5–14.5)
WBC: 6.5 10*3/uL (ref 3.6–11.0)

## 2017-03-10 MED ORDER — CEPHALEXIN 500 MG PO CAPS: 500.0000 mg | ORAL_CAPSULE | Freq: Three times a day (TID) | ORAL | 0 refills | Status: DC

## 2017-03-10 MED ORDER — SODIUM CHLORIDE 0.9 % IV SOLN
150.0000 mg/m2 | Freq: Once | INTRAVENOUS | Status: AC
  Administered 2017-03-10: 258 mg via INTRAVENOUS
  Filled 2017-03-10: qty 43

## 2017-03-10 MED ORDER — SODIUM CHLORIDE 0.9 % IV SOLN
280.0000 mg | Freq: Once | INTRAVENOUS | Status: AC
  Administered 2017-03-10: 280 mg via INTRAVENOUS
  Filled 2017-03-10: qty 28

## 2017-03-10 MED ORDER — SODIUM CHLORIDE 0.9 % IV SOLN
Freq: Once | INTRAVENOUS | Status: AC
  Administered 2017-03-10: 10:00:00 via INTRAVENOUS
  Filled 2017-03-10: qty 1000

## 2017-03-10 MED ORDER — FENTANYL 50 MCG/HR TD PT72: 50.0000 ug | MEDICATED_PATCH | TRANSDERMAL | 0 refills | Status: DC

## 2017-03-10 MED ORDER — OXYCODONE HCL 10 MG PO TABS: 10.0000 mg | ORAL_TABLET | ORAL | 0 refills | Status: DC | PRN

## 2017-03-10 MED ORDER — DEXAMETHASONE SODIUM PHOSPHATE 10 MG/ML IJ SOLN
10.0000 mg | Freq: Once | INTRAMUSCULAR | Status: AC
  Administered 2017-03-10: 10 mg via INTRAVENOUS
  Filled 2017-03-10: qty 1

## 2017-03-10 MED ORDER — PEGFILGRASTIM 6 MG/0.6ML ~~LOC~~ PSKT
6.0000 mg | PREFILLED_SYRINGE | Freq: Once | SUBCUTANEOUS | Status: AC
  Administered 2017-03-10: 6 mg via SUBCUTANEOUS
  Filled 2017-03-10: qty 0.6

## 2017-03-10 MED ORDER — FAMOTIDINE IN NACL 20-0.9 MG/50ML-% IV SOLN
20.0000 mg | Freq: Once | INTRAVENOUS | Status: AC
  Administered 2017-03-10: 20 mg via INTRAVENOUS
  Filled 2017-03-10: qty 50

## 2017-03-10 MED ORDER — DIPHENHYDRAMINE HCL 50 MG/ML IJ SOLN
25.0000 mg | Freq: Once | INTRAMUSCULAR | Status: AC
  Administered 2017-03-10: 25 mg via INTRAVENOUS
  Filled 2017-03-10: qty 1

## 2017-03-10 MED ORDER — SODIUM CHLORIDE 0.9 % IV SOLN: 10.0000 mg | Freq: Once | INTRAVENOUS | Status: DC

## 2017-03-10 MED ORDER — PALONOSETRON HCL INJECTION 0.25 MG/5ML
0.2500 mg | Freq: Once | INTRAVENOUS | Status: AC
  Administered 2017-03-10: 0.25 mg via INTRAVENOUS
  Filled 2017-03-10: qty 5

## 2017-03-23 DIAGNOSIS — I82409 Acute embolism and thrombosis of unspecified deep veins of unspecified lower extremity: Secondary | ICD-10-CM

## 2017-03-23 HISTORY — DX: Acute embolism and thrombosis of unspecified deep veins of unspecified lower extremity: I82.409

## 2017-03-26 ENCOUNTER — Ambulatory Visit
Admission: RE | Admit: 2017-03-26 | Discharge: 2017-03-26 | Disposition: A | Discharge status: Home / Self Care | Payer: Medicare Other | Source: Ambulatory Visit | Attending: Oncology | Admitting: Oncology

## 2017-03-26 DIAGNOSIS — N200 Calculus of kidney: Secondary | ICD-10-CM | POA: Insufficient documentation

## 2017-03-26 DIAGNOSIS — R9389 Abnormal findings on diagnostic imaging of other specified body structures: Secondary | ICD-10-CM | POA: Diagnosis not present

## 2017-03-26 DIAGNOSIS — I700 Atherosclerosis of aorta: Secondary | ICD-10-CM | POA: Diagnosis not present

## 2017-03-26 DIAGNOSIS — R590 Localized enlarged lymph nodes: Secondary | ICD-10-CM | POA: Insufficient documentation

## 2017-03-26 DIAGNOSIS — K5730 Diverticulosis of large intestine without perforation or abscess without bleeding: Secondary | ICD-10-CM | POA: Diagnosis not present

## 2017-03-26 DIAGNOSIS — I2510 Atherosclerotic heart disease of native coronary artery without angina pectoris: Secondary | ICD-10-CM | POA: Insufficient documentation

## 2017-03-26 DIAGNOSIS — C541 Malignant neoplasm of endometrium: Secondary | ICD-10-CM | POA: Insufficient documentation

## 2017-03-26 MED ORDER — IOPAMIDOL (ISOVUE-300) INJECTION 61%
100.0000 mL | Freq: Once | INTRAVENOUS | Status: AC | PRN
  Administered 2017-03-26: 100 mL via INTRAVENOUS

## 2017-03-28 NOTE — Progress Notes (Signed)
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  INTERVAL HISTORY: Patient returns to clinic today for further evaluation, discussion of her imaging results, and consideration of cycle 5 of carboplatinum and Taxol. She currently feels well and at her baseline.  Her pain has resolved and she is no longer taking oxycodone. The swelling of her left lower extremity is essentially unchanged but significantly improved since initiating treatment. She has chronic weakness and fatigue. She has no neurologic complaints. She denies any recent fevers or illnesses. Her appetite has improved and her weight is stable. She denies any chest pain or shortness of breath. She denies any nausea, vomiting, constipation, or diarrhea.  She has no urinary complaints. Patient offers no further specific complaints.  REVIEW OF SYSTEMS:   Review of Systems  Constitutional: Positive for malaise/fatigue. Negative for fever and weight loss.  Respiratory: Negative.  Negative for cough and shortness of breath.   Cardiovascular: Positive for leg swelling. Negative for chest pain.  Gastrointestinal: Negative for abdominal pain, constipation, nausea and vomiting.  Genitourinary: Negative.   Musculoskeletal: Negative.   Skin: Negative.  Negative for rash.  Neurological: Positive for weakness.  Psychiatric/Behavioral: Negative.  The patient is not nervous/anxious.     As per HPI. Otherwise, a complete review of systems is negative.  PAST MEDICAL HISTORY: Past Medical History:  Diagnosis Date  . Arthritis   . CHF (congestive heart failure) ([place])   . Constipation   . DVT (deep venous thrombosis) ([place])   . Endometrial cancer ([place]) 01/28/2016  .  Hyperlipidemia   . Hypertension   . Hypothyroidism     PAST SURGICAL HISTORY: Past Surgical History:  Procedure Laterality Date  . CYSTOSCOPY W/ RETROGRADES Left 01/01/2017   Procedure: CYSTOSCOPY WITH RETROGRADE PYELOGRAM;  Surgeon: Nickie Retort, MD;  Location: [facility];  Service: Urology;  Laterality: Left;  . CYSTOSCOPY WITH STENT PLACEMENT Left 01/01/2017   Procedure: CYSTOSCOPY WITH STENT PLACEMENT;  Surgeon: Nickie Retort, MD;  Location: [facility];  Service: Urology;  Laterality: Left;  . DILATION AND CURETTAGE OF UTERUS    . HYSTEROSCOPY W/D&C N/A 01/28/2016   Procedure: DILATATION AND CURETTAGE /HYSTEROSCOPY;  Surgeon: [name], MD;  Location: [facility];  Service: Gynecology;  Laterality: N/A;  . LAPAROSCOPIC BILATERAL SALPINGO OOPHERECTOMY Bilateral 02/26/2016   Procedure: LAPAROSCOPIC BILATERAL SALPINGO OOPHORECTOMY;  Surgeon: [name], MD;  Location: [facility];  Service: Gynecology;  Laterality: Bilateral;  . LAPAROSCOPIC HYSTERECTOMY N/A 02/26/2016   Procedure: HYSTERECTOMY TOTAL LAPAROSCOPIC;  Surgeon: [name], MD;  Location: [facility];  Service: Gynecology;  Laterality: N/A;  . SENTINEL NODE BIOPSY N/A 02/26/2016   Procedure: SENTINEL NODE BIOPSY;  Surgeon: [name], MD;  Location: [facility];  Service: Gynecology;  Laterality: N/A;  . TONSILLECTOMY      FAMILY HISTORY: Family History  Problem Relation Age of Onset  . Diabetes Father   . Heart disease Father   . Hypertension Father   . Diabetes Paternal Grandmother   . Heart disease Mother   . Hypertension Mother   . Stroke Mother   . Hypertension Sister   . Thyroid disease Sister   . Hypertension Brother     ADVANCED DIRECTIVES (Y/N):  N  HEALTH MAINTENANCE: Social History   Tobacco  Use  . Smoking status: Former Smoker  . Smokeless tobacco: Never Used  Substance Use Topics  . Alcohol use: No  . Drug use: No     Colonoscopy:  PAP:  Bone density:  Lipid  panel:  Allergies  Allergen Reactions  . Sulfa Antibiotics Rash  . Zinc Rash    Current Outpatient Medications  Medication Sig Dispense Refill  . ELIQUIS 5 MG TABS tablet Take 5 mg by mouth 2 (two) times daily.  0  . fentaNYL (DURAGESIC - DOSED MCG/HR) 50 MCG/HR Place 1 patch (50 mcg total) onto the skin every 3 (three) days. 10 patch 0  . furosemide (LASIX) 20 MG tablet Take 20 mg by mouth 2 (two) times daily.  0  . KLOR-CON 10 10 MEQ tablet Take 10 mEq by mouth daily. with food  0  . levothyroxine (SYNTHROID, LEVOTHROID) 100 MCG tablet Take 150 mcg by mouth daily before breakfast.     . lidocaine (LIDODERM) 5 % APPLY TO PAINFUL AREAS ONCE A DAY FOR 12 HOURS ON AND 12 HOURS OFF  0  . senna (SENOKOT) 8.6 MG tablet Take 2 tablets by mouth 2 (two) times daily.    [name] acetaminophen (TYLENOL) 325 MG tablet Take 650 mg by mouth every 6 (six) hours as needed for moderate pain.    . cephALEXin (KEFLEX) 500 MG capsule Take 1 capsule (500 mg total) by mouth 3 (three) times daily. (Patient not taking: Reported on 03/31/2017) 21 capsule 0  . [employer] MELATONIN 3 MG TABS TAKE 1 TABLET BY MOUTH AT BEDTIME FOR INSOMNIA  0  . [employer] VITAMIN B-12 500 MCG tablet Take 1,000 mcg by mouth daily.  0  . folic acid (FOLVITE) 1 MG tablet Take 1 mg by mouth daily.  0  . ondansetron (ZOFRAN) 8 MG tablet Take 1 tablet (8 mg total) by mouth 2 (two) times daily as needed for refractory nausea / vomiting. Start on day 3 after chemo. (Patient not taking: Reported on 02/17/2017) 60 tablet 2  . Oxycodone HCl 10 MG TABS Take 1 tablet (10 mg total) by mouth every 4 (four) hours as needed. (Patient not taking: Reported on 03/31/2017) 60 tablet 0  . prochlorperazine (COMPAZINE) 10 MG tablet Take 1 tablet (10 mg total) by mouth every 6 (six) hours as needed (Nausea or vomiting). (Patient not taking: Reported on 02/17/2017) 60 tablet 2   No current facility-administered medications for this visit.     OBJECTIVE: Vitals:   03/31/17 0851   BP: 112/71  Pulse: 76  Resp: 18  Temp: 99 F (37.2 C)     Body mass index is 26.43 kg/m.    ECOG FS:1 - Symptomatic but completely ambulatory  General: Ill-appearing, sitting in a wheelchair, no acute distress. Eyes: Pink conjunctiva, anicteric sclera. Lungs: Clear to auscultation bilaterally. Heart: Regular rate and rhythm. No rubs, murmurs, or gallops. Abdomen: Soft, nontender, nondistended. No organomegaly noted, normoactive bowel sounds. Musculoskeletal: 2-3+ left leg lymphedema.  Neuro: Alert, answering all questions appropriately. Cranial nerves grossly intact. Skin: No rashes or petechiae noted. Psych: Normal affect.   LAB RESULTS:  Lab Results  Component Value Date   NA 140 03/31/2017   K 3.4 (L) 03/31/2017   CL 104 03/31/2017   CO2 26 03/31/2017   GLUCOSE 103 (H) 03/31/2017   BUN 9 03/31/2017   CREATININE 0.67 03/31/2017   CALCIUM 8.7 (L) 03/31/2017   PROT 6.7 03/31/2017   ALBUMIN 3.1 (L) 03/31/2017   AST 20 03/31/2017  ALT 8 (L) 03/31/2017   ALKPHOS 77 03/31/2017   BILITOT 0.6 03/31/2017   GFRNONAA >60 03/31/2017   GFRAA >60 03/31/2017    Lab Results  Component Value Date   WBC 5.9 03/31/2017   NEUTROABS 3.5 03/31/2017   HGB 9.7 (L) 03/31/2017   HCT 28.8 (L) 03/31/2017   MCV 98.6 03/31/2017   PLT 299 03/31/2017     STUDIES: Ct Chest W Contrast  Result Date: 03/26/2017 CLINICAL DATA:  Endometrial cancer restaging, recent completion of chemotherapy. EXAM: CT CHEST, ABDOMEN, AND PELVIS WITH CONTRAST TECHNIQUE: Multidetector CT imaging of the chest, abdomen and pelvis was performed following the standard protocol during bolus administration of intravenous contrast. CONTRAST:  181mL ISOVUE-300 IOPAMIDOL (ISOVUE-300) INJECTION 61% COMPARISON:  Multiple exams, including 12/25/2016 and 11/04/2016 FINDINGS: CT CHEST FINDINGS Cardiovascular: Coronary, aortic arch, and branch vessel atherosclerotic vascular disease. Small amounts of residual chronic bilateral  pulmonary embolus including a flap in the left lower lobe pulmonary artery on image 84/4, and a smaller web or flap in the right lower lobe pulmonary artery on image 75/4. Mediastinum/Nodes: Right lower paratracheal lymph node 1.0 cm in short axis on image 19/2, previously 1.2 cm. Lungs/Pleura: Biapical pleuroparenchymal scarring. There is bandlike scarring in the posterior basal segment right lower lobe. Mild scarring in the left lower lobe. Cylindrical bronchiectasis in both lower lobes. Musculoskeletal: Mild sclerosis anteriorly in the right eighth rib shown on image 105/6 favoring a healing fracture. CT ABDOMEN PELVIS FINDINGS Hepatobiliary: Unremarkable Pancreas: Unremarkable Spleen: Stable small splenic hypodense lesion measuring 1.0 by 1.1 cm on image 53/2, this lesion is nearly isodense on the delayed images. The perisplenic fluid collection shown on the CT from 12/25/2016 has resolved. Adrenals/Urinary Tract: Stable right renal cysts. 2 mm left kidney lower pole nonobstructive renal calculus. Left double-J ureteral stent in satisfactory position without hydronephrosis or hydroureter. The degree of mass effect on the left ureter due to pelvic adenopathy is reduced. Stomach/Bowel: Prominent stool throughout the colon favors constipation. Mild sigmoid colon diverticulosis. Vascular/Lymphatic: Aortoiliac atherosclerotic vascular disease. IVC filter satisfactorily positioned. Precaval node just above the level of the bifurcation measures 1.1 cm in short axis on image 69/2 (formerly 3.5 cm). A left obturator or external iliac lymph node measures 5.2 by 6.0 cm on image 90/2, and was formerly 5.9 by 8.9 cm by my measurements. Reduced mass effect on the left ureter and iliac vessels although there continues to be some effacement of the left external iliac vein. Reproductive: Uterus absent. Left adnexa not readily separable from the suspected left pelvic sidewall adenopathy. Other: No supplemental non-categorized  findings. Musculoskeletal: Lumbar spondylosis and degenerative disc disease. Mild left foraminal impingement at L4-5 due to spurring. Indistinct nodularity within the left gluteus maximus muscle laterally measuring about 1.8 by 1.4 cm on image 94/2, previously 2.1 by 1.2 cm, significance uncertain. IMPRESSION: 1. Improved appearance of adenopathy in the pelvis, abdomen, and chest. Reduced mass effect from the large left pelvic sidewall lymph node which currently measures 5.2 by 6.0 cm. A left ureteral stent remains in place and there is no overt left hydronephrosis. 2. Small flaps or webs in the right and left lower lobe pulmonary arteries compatible with chronic pulmonary embolus. 3. Indistinct nodularity in the left gluteus maximus muscle measuring 1.8 by 1.4 cm, essentially stable, potentially an intramuscular lymph node. 4. Other imaging findings of potential clinical significance: Aortic Atherosclerosis (ICD10-I70.0). Coronary atherosclerosis. Healing right eighth rib fracture anteriorly. Likely benign splenic hypodense lesion. 2 mm nonobstructive left kidney lower  pole calculus. Prominent stool throughout the colon favors constipation. Mild sigmoid diverticulosis. IVC filter in place. Mild impingement at L4-5. Electronically Signed   By: Van Clines M.D.   On: 03/26/2017 14:49   Ct Abdomen Pelvis W Contrast  Result Date: 03/26/2017 CLINICAL DATA:  Endometrial cancer restaging, recent completion of chemotherapy. EXAM: CT CHEST, ABDOMEN, AND PELVIS WITH CONTRAST TECHNIQUE: Multidetector CT imaging of the chest, abdomen and pelvis was performed following the standard protocol during bolus administration of intravenous contrast. CONTRAST:  145mL ISOVUE-300 IOPAMIDOL (ISOVUE-300) INJECTION 61% COMPARISON:  Multiple exams, including 12/25/2016 and 11/04/2016 FINDINGS: CT CHEST FINDINGS Cardiovascular: Coronary, aortic arch, and branch vessel atherosclerotic vascular disease. Small amounts of residual  chronic bilateral pulmonary embolus including a flap in the left lower lobe pulmonary artery on image 84/4, and a smaller web or flap in the right lower lobe pulmonary artery on image 75/4. Mediastinum/Nodes: Right lower paratracheal lymph node 1.0 cm in short axis on image 19/2, previously 1.2 cm. Lungs/Pleura: Biapical pleuroparenchymal scarring. There is bandlike scarring in the posterior basal segment right lower lobe. Mild scarring in the left lower lobe. Cylindrical bronchiectasis in both lower lobes. Musculoskeletal: Mild sclerosis anteriorly in the right eighth rib shown on image 105/6 favoring a healing fracture. CT ABDOMEN PELVIS FINDINGS Hepatobiliary: Unremarkable Pancreas: Unremarkable Spleen: Stable small splenic hypodense lesion measuring 1.0 by 1.1 cm on image 53/2, this lesion is nearly isodense on the delayed images. The perisplenic fluid collection shown on the CT from 12/25/2016 has resolved. Adrenals/Urinary Tract: Stable right renal cysts. 2 mm left kidney lower pole nonobstructive renal calculus. Left double-J ureteral stent in satisfactory position without hydronephrosis or hydroureter. The degree of mass effect on the left ureter due to pelvic adenopathy is reduced. Stomach/Bowel: Prominent stool throughout the colon favors constipation. Mild sigmoid colon diverticulosis. Vascular/Lymphatic: Aortoiliac atherosclerotic vascular disease. IVC filter satisfactorily positioned. Precaval node just above the level of the bifurcation measures 1.1 cm in short axis on image 69/2 (formerly 3.5 cm). A left obturator or external iliac lymph node measures 5.2 by 6.0 cm on image 90/2, and was formerly 5.9 by 8.9 cm by my measurements. Reduced mass effect on the left ureter and iliac vessels although there continues to be some effacement of the left external iliac vein. Reproductive: Uterus absent. Left adnexa not readily separable from the suspected left pelvic sidewall adenopathy. Other: No supplemental  non-categorized findings. Musculoskeletal: Lumbar spondylosis and degenerative disc disease. Mild left foraminal impingement at L4-5 due to spurring. Indistinct nodularity within the left gluteus maximus muscle laterally measuring about 1.8 by 1.4 cm on image 94/2, previously 2.1 by 1.2 cm, significance uncertain. IMPRESSION: 1. Improved appearance of adenopathy in the pelvis, abdomen, and chest. Reduced mass effect from the large left pelvic sidewall lymph node which currently measures 5.2 by 6.0 cm. A left ureteral stent remains in place and there is no overt left hydronephrosis. 2. Small flaps or webs in the right and left lower lobe pulmonary arteries compatible with chronic pulmonary embolus. 3. Indistinct nodularity in the left gluteus maximus muscle measuring 1.8 by 1.4 cm, essentially stable, potentially an intramuscular lymph node. 4. Other imaging findings of potential clinical significance: Aortic Atherosclerosis (ICD10-I70.0). Coronary atherosclerosis. Healing right eighth rib fracture anteriorly. Likely benign splenic hypodense lesion. 2 mm nonobstructive left kidney lower pole calculus. Prominent stool throughout the colon favors constipation. Mild sigmoid diverticulosis. IVC filter in place. Mild impingement at L4-5. Electronically Signed   By: Van Clines M.D.   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  PLAN:    1. 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 Adjuvant treatment was recommended at that time, but patient refused on multiple occations and missed multiple follow-up appointments in the interim. She has now agreed to attempt adjuvant chemotherapy using carboplatinum and Taxol every 3 weeks with Neulasta support. Patient continues to decline port placement.  CT scan results from March 26, 2017 reviewed independently and  report as above with significant improvement in patient's disease burden.  Previously, carboplatinum and Taxol were dose reduced from the onset given her decreased performance status.  Proceed with cycle 5 of carboplatinum and Taxol today with Neulasta support.  Return to clinic in 3 weeks for consideration of cycle 6.  Will likely reimage again at the conclusion of cycle 6.   2. Pain: Resolved.  Patient is no longer taking narcotics.   3. Hypokalemia:  Continue oral supplementation. 4. Lymphedema: Improving, monitor. 5.  Anemia: Mild, monitor.  Approximately 30 minutes was spent in discussion of which greater than 50% was consultation.  Patient expressed understanding and was in agreement with this plan. She also understands that She can call clinic at any time with any questions, concerns, or complaints.   Cancer Staging No matching staging information was found for the patient.   [name], MD   04/03/2017 10:24 AM

## 2017-03-29 ENCOUNTER — Telehealth: Payer: Self-pay | Admitting: *Deleted

## 2017-03-29 NOTE — Telephone Encounter (Signed)
Per VO Dr Grayland Ormond, she was always to come Wednesday to get her fourth treatment. She may or may not get more treatment. Patient advised of this and will be here Wednesday as scheduled

## 2017-03-29 NOTE — Telephone Encounter (Signed)
Patient states that some one was to call her to let her know if she needs to keep appointment 03/31/17

## 2017-03-31 ENCOUNTER — Inpatient Hospital Stay (HOSPITAL_BASED_OUTPATIENT_CLINIC_OR_DEPARTMENT_OTHER): Payer: Medicare Other | Admitting: Oncology

## 2017-03-31 ENCOUNTER — Inpatient Hospital Stay: Payer: Medicare Other | Attending: Oncology

## 2017-03-31 ENCOUNTER — Inpatient Hospital Stay: Payer: Medicare Other

## 2017-03-31 VITALS — BP 112/71 | HR 76 | Temp 99.0°F | Resp 18 | Wt 142.2 lb

## 2017-03-31 DIAGNOSIS — R918 Other nonspecific abnormal finding of lung field: Secondary | ICD-10-CM | POA: Diagnosis not present

## 2017-03-31 DIAGNOSIS — Z8542 Personal history of malignant neoplasm of other parts of uterus: Secondary | ICD-10-CM | POA: Diagnosis not present

## 2017-03-31 DIAGNOSIS — D649 Anemia, unspecified: Secondary | ICD-10-CM

## 2017-03-31 DIAGNOSIS — E876 Hypokalemia: Secondary | ICD-10-CM | POA: Diagnosis not present

## 2017-03-31 DIAGNOSIS — C541 Malignant neoplasm of endometrium: Secondary | ICD-10-CM | POA: Insufficient documentation

## 2017-03-31 DIAGNOSIS — I509 Heart failure, unspecified: Secondary | ICD-10-CM | POA: Diagnosis not present

## 2017-03-31 DIAGNOSIS — K5730 Diverticulosis of large intestine without perforation or abscess without bleeding: Secondary | ICD-10-CM | POA: Diagnosis not present

## 2017-03-31 DIAGNOSIS — Z87891 Personal history of nicotine dependence: Secondary | ICD-10-CM

## 2017-03-31 DIAGNOSIS — R5383 Other fatigue: Secondary | ICD-10-CM

## 2017-03-31 DIAGNOSIS — K5900 Constipation, unspecified: Secondary | ICD-10-CM | POA: Insufficient documentation

## 2017-03-31 DIAGNOSIS — E785 Hyperlipidemia, unspecified: Secondary | ICD-10-CM | POA: Insufficient documentation

## 2017-03-31 DIAGNOSIS — I2510 Atherosclerotic heart disease of native coronary artery without angina pectoris: Secondary | ICD-10-CM | POA: Insufficient documentation

## 2017-03-31 DIAGNOSIS — I890 Lymphedema, not elsewhere classified: Secondary | ICD-10-CM | POA: Diagnosis not present

## 2017-03-31 DIAGNOSIS — I10 Essential (primary) hypertension: Secondary | ICD-10-CM | POA: Diagnosis not present

## 2017-03-31 DIAGNOSIS — Z79899 Other long term (current) drug therapy: Secondary | ICD-10-CM | POA: Diagnosis not present

## 2017-03-31 DIAGNOSIS — Z5111 Encounter for antineoplastic chemotherapy: Secondary | ICD-10-CM | POA: Insufficient documentation

## 2017-03-31 DIAGNOSIS — R630 Anorexia: Secondary | ICD-10-CM | POA: Diagnosis not present

## 2017-03-31 DIAGNOSIS — R531 Weakness: Secondary | ICD-10-CM

## 2017-03-31 DIAGNOSIS — R634 Abnormal weight loss: Secondary | ICD-10-CM | POA: Diagnosis not present

## 2017-03-31 DIAGNOSIS — E039 Hypothyroidism, unspecified: Secondary | ICD-10-CM | POA: Insufficient documentation

## 2017-03-31 DIAGNOSIS — I82402 Acute embolism and thrombosis of unspecified deep veins of left lower extremity: Secondary | ICD-10-CM | POA: Insufficient documentation

## 2017-03-31 DIAGNOSIS — N200 Calculus of kidney: Secondary | ICD-10-CM | POA: Diagnosis not present

## 2017-03-31 DIAGNOSIS — Z8781 Personal history of (healed) traumatic fracture: Secondary | ICD-10-CM | POA: Insufficient documentation

## 2017-03-31 DIAGNOSIS — N281 Cyst of kidney, acquired: Secondary | ICD-10-CM | POA: Insufficient documentation

## 2017-03-31 DIAGNOSIS — J479 Bronchiectasis, uncomplicated: Secondary | ICD-10-CM | POA: Diagnosis not present

## 2017-03-31 LAB — COMPREHENSIVE METABOLIC PANEL
ALBUMIN: 3.1 g/dL — AB (ref 3.5–5.0)
ALT: 8 U/L — ABNORMAL LOW (ref 14–54)
AST: 20 U/L (ref 15–41)
Alkaline Phosphatase: 77 U/L (ref 38–126)
Anion gap: 10 (ref 5–15)
BUN: 9 mg/dL (ref 6–20)
CHLORIDE: 104 mmol/L (ref 101–111)
CO2: 26 mmol/L (ref 22–32)
Calcium: 8.7 mg/dL — ABNORMAL LOW (ref 8.9–10.3)
Creatinine, Ser: 0.67 mg/dL (ref 0.44–1.00)
GFR calc Af Amer: >60 mL/min (ref >60)
GFR calc non Af Amer: >60 mL/min (ref >60)
GLUCOSE: 103 mg/dL — AB (ref 65–99)
Potassium: 3.4 mmol/L — ABNORMAL LOW (ref 3.5–5.1)
SODIUM: 140 mmol/L (ref 135–145)
Total Bilirubin: 0.6 mg/dL (ref 0.3–1.2)
Total Protein: 6.7 g/dL (ref 6.5–8.1)

## 2017-03-31 LAB — CBC WITH DIFFERENTIAL/PLATELET
Basophils Absolute: 0.1 10*3/uL (ref 0–0.1)
Basophils Relative: 1 %
Eosinophils Absolute: 0 10*3/uL (ref 0–0.7)
Eosinophils Relative: 0 %
HEMATOCRIT: 28.8 % — AB (ref 35.0–47.0)
HEMOGLOBIN: 9.7 g/dL — AB (ref 12.0–16.0)
LYMPHS ABS: 1.9 10*3/uL (ref 1.0–3.6)
Lymphocytes Relative: 32 %
MCH: 33.3 pg (ref 26.0–34.0)
MCHC: 33.7 g/dL (ref 32.0–36.0)
MCV: 98.6 fL (ref 80.0–100.0)
MONOS PCT: 8 %
Monocytes Absolute: 0.5 10*3/uL (ref 0.2–0.9)
NEUTROS ABS: 3.5 10*3/uL (ref 1.4–6.5)
NEUTROS PCT: 59 %
Platelets: 299 10*3/uL (ref 150–440)
RBC: 2.92 MIL/uL — ABNORMAL LOW (ref 3.80–5.20)
RDW: 17.1 % — ABNORMAL HIGH (ref 11.5–14.5)
WBC: 5.9 10*3/uL (ref 3.6–11.0)

## 2017-03-31 MED ORDER — PEGFILGRASTIM 6 MG/0.6ML ~~LOC~~ PSKT
6.0000 mg | PREFILLED_SYRINGE | Freq: Once | SUBCUTANEOUS | Status: AC
  Administered 2017-03-31: 6 mg via SUBCUTANEOUS
  Filled 2017-03-31: qty 0.6

## 2017-03-31 MED ORDER — SODIUM CHLORIDE 0.9 % IV SOLN
258.0000 mg | Freq: Once | INTRAVENOUS | Status: AC
  Administered 2017-03-31: 258 mg via INTRAVENOUS
  Filled 2017-03-31: qty 43

## 2017-03-31 MED ORDER — FAMOTIDINE IN NACL 20-0.9 MG/50ML-% IV SOLN
20.0000 mg | Freq: Once | INTRAVENOUS | Status: AC
  Administered 2017-03-31: 20 mg via INTRAVENOUS
  Filled 2017-03-31: qty 50

## 2017-03-31 MED ORDER — SODIUM CHLORIDE 0.9 % IV SOLN
280.0000 mg | Freq: Once | INTRAVENOUS | Status: AC
  Administered 2017-03-31: 280 mg via INTRAVENOUS
  Filled 2017-03-31: qty 28

## 2017-03-31 MED ORDER — DIPHENHYDRAMINE HCL 50 MG/ML IJ SOLN
25.0000 mg | Freq: Once | INTRAMUSCULAR | Status: AC
  Administered 2017-03-31: 25 mg via INTRAVENOUS
  Filled 2017-03-31: qty 1

## 2017-03-31 MED ORDER — PACLITAXEL CHEMO INJECTION 300 MG/50ML: 150.0000 mg/m2 | Freq: Once | INTRAVENOUS | Status: DC

## 2017-03-31 MED ORDER — DEXAMETHASONE SODIUM PHOSPHATE 10 MG/ML IJ SOLN
10.0000 mg | Freq: Once | INTRAMUSCULAR | Status: AC
  Administered 2017-03-31: 10 mg via INTRAVENOUS
  Filled 2017-03-31: qty 1

## 2017-03-31 MED ORDER — PALONOSETRON HCL INJECTION 0.25 MG/5ML
0.2500 mg | Freq: Once | INTRAVENOUS | Status: AC
  Administered 2017-03-31: 0.25 mg via INTRAVENOUS
  Filled 2017-03-31: qty 5

## 2017-03-31 MED ORDER — SODIUM CHLORIDE 0.9 % IV SOLN
Freq: Once | INTRAVENOUS | Status: AC
  Administered 2017-03-31: 10:00:00 via INTRAVENOUS
  Filled 2017-03-31: qty 1000

## 2017-03-31 NOTE — Progress Notes (Signed)
Patient is here today for follow up, she mentions she does not want treatment today.

## 2017-04-15 ENCOUNTER — Ambulatory Visit (INDEPENDENT_AMBULATORY_CARE_PROVIDER_SITE_OTHER): Payer: Medicare Other | Admitting: Urology

## 2017-04-15 ENCOUNTER — Other Ambulatory Visit: Payer: Self-pay | Admitting: Radiology

## 2017-04-15 ENCOUNTER — Telehealth: Payer: Self-pay | Admitting: Radiology

## 2017-04-15 ENCOUNTER — Encounter: Payer: Self-pay | Admitting: Urology

## 2017-04-15 VITALS — BP 143/83 | HR 81 | Ht 61.0 in | Wt 142.0 lb

## 2017-04-15 DIAGNOSIS — N1330 Unspecified hydronephrosis: Secondary | ICD-10-CM | POA: Diagnosis not present

## 2017-04-15 LAB — URINALYSIS, COMPLETE
Bilirubin, UA: NEGATIVE
GLUCOSE, UA: NEGATIVE
Ketones, UA: NEGATIVE
Leukocytes, UA: NEGATIVE
Nitrite, UA: NEGATIVE
PROTEIN UA: NEGATIVE
RBC, UA: NEGATIVE
Specific Gravity, UA: 1.015 (ref 1.005–1.030)
UUROB: 0.2 mg/dL (ref 0.2–1.0)
pH, UA: 5.5 (ref 5.0–7.5)

## 2017-04-15 NOTE — H&P (View-Only) (Signed)
04/15/2017 3:49 PM   Vickie Barton [birth year] [id number]  Referring provider: [name], MD [address] [facility] [place], [place] [address]  Chief Complaint  Patient presents with  . Hydronephrosis    HPI: 84 year old female presents for follow-up of left hydronephrosis.  She had a left hydronephrosis secondary to extrinsic ureteral obstruction from endometrial carcinoma.  She had placement of a left ureteral stent by Dr. Pilar Jarvis on 01/01/2017.  She is currently undergoing chemotherapy with carboplatinum and Taxol.  A CT performed on 03/26/2017 showed a left ureteral stent without hydronephrosis or hydroureter.  The degree of mass-effect of the left ureter from pelvic adenopathy was decreased.   PMH: Past Medical History:  Diagnosis Date  . Arthritis   . CHF (congestive heart failure) ([place])   . Constipation   . DVT (deep venous thrombosis) ([place])   . Endometrial cancer ([place]) 01/28/2016  . Hyperlipidemia   . Hypertension   . Hypothyroidism     Surgical History: Past Surgical History:  Procedure Laterality Date  . CYSTOSCOPY W/ RETROGRADES Left 01/01/2017   Procedure: CYSTOSCOPY WITH RETROGRADE PYELOGRAM;  Surgeon: Nickie Retort, MD;  Location: [facility];  Service: Urology;  Laterality: Left;  . CYSTOSCOPY WITH STENT PLACEMENT Left 01/01/2017   Procedure: CYSTOSCOPY WITH STENT PLACEMENT;  Surgeon: Nickie Retort, MD;  Location: [facility];  Service: Urology;  Laterality: Left;  . DILATION AND CURETTAGE OF UTERUS    . HYSTEROSCOPY W/D&C N/A 01/28/2016   Procedure: DILATATION AND CURETTAGE /HYSTEROSCOPY;  Surgeon: [name], MD;  Location: [facility];  Service: Gynecology;  Laterality: N/A;  . LAPAROSCOPIC BILATERAL SALPINGO OOPHERECTOMY Bilateral 02/26/2016   Procedure: LAPAROSCOPIC BILATERAL SALPINGO OOPHORECTOMY;  Surgeon: [name], MD;  Location: [facility];  Service: Gynecology;  Laterality: Bilateral;  . LAPAROSCOPIC HYSTERECTOMY N/A  02/26/2016   Procedure: HYSTERECTOMY TOTAL LAPAROSCOPIC;  Surgeon: [name], MD;  Location: [facility];  Service: Gynecology;  Laterality: N/A;  . SENTINEL NODE BIOPSY N/A 02/26/2016   Procedure: SENTINEL NODE BIOPSY;  Surgeon: [name], MD;  Location: [facility];  Service: Gynecology;  Laterality: N/A;  . TONSILLECTOMY      Home Medications:  Allergies as of 04/15/2017      Reactions   Sulfa Antibiotics Rash   Zinc Rash      Medication List        Accurate as of 04/15/17  3:49 PM. Always use your most recent med list.          ELIQUIS 5 MG Tabs tablet Generic drug:  apixaban Take 5 mg by mouth 2 (two) times daily.   furosemide 20 MG tablet Commonly known as:  LASIX Take 20 mg by mouth 2 (two) times daily.   KLOR-CON 10 10 MEQ tablet Generic drug:  potassium chloride Take 10 mEq by mouth daily. with food   levothyroxine 150 MCG tablet Commonly known as:  SYNTHROID, LEVOTHROID TAKE 1TAB DAILY BEFORE BREAKFAST ON EMPTY STOMACH WITH A GLASS OF WATER 30-60MINS BEFORE BREAKFAST   Oxycodone HCl 10 MG Tabs Take 1 tablet (10 mg total) by mouth every 4 (four) hours as needed.   prochlorperazine 10 MG tablet Commonly known as:  COMPAZINE Take 1 tablet (10 mg total) by mouth every 6 (six) hours as needed (Nausea or vomiting).       Allergies:  Allergies  Allergen Reactions  . Sulfa Antibiotics Rash  . Zinc Rash    Family History: Family History  Problem Relation  Age of Onset  . Diabetes Father   . Heart disease Father   . Hypertension Father   . Diabetes Paternal Grandmother   . Heart disease Mother   . Hypertension Mother   . Stroke Mother   . Hypertension Sister   . Thyroid disease Sister   . Hypertension Brother     Social History:  reports that she has quit smoking. she has never used smokeless tobacco. She reports that she does not drink alcohol or use drugs.  ROS: UROLOGY Frequent Urination?: No Hard to postpone urination?: No Burning/pain  with urination?: No Get up at night to urinate?: No Leakage of urine?: No Urine stream starts and stops?: No Trouble starting stream?: No Do you have to strain to urinate?: No Blood in urine?: No Urinary tract infection?: No Sexually transmitted disease?: No Injury to kidneys or bladder?: No Painful intercourse?: No Weak stream?: No Currently pregnant?: No Vaginal bleeding?: No Last menstrual period?: n  Gastrointestinal Nausea?: No Vomiting?: No Indigestion/heartburn?: No Diarrhea?: No Constipation?: No  Constitutional Fever: No Night sweats?: No Weight loss?: No Fatigue?: No  Skin Skin rash/lesions?: No Itching?: No  Eyes Blurred vision?: No Double vision?: No  Ears/Nose/Throat Sore throat?: No Sinus problems?: No  Hematologic/Lymphatic Swollen glands?: No Easy bruising?: No  Cardiovascular Leg swelling?: Yes Chest pain?: No  Respiratory Cough?: No Shortness of breath?: No  Endocrine Excessive thirst?: No  Musculoskeletal Back pain?: No Joint pain?: No  Neurological Headaches?: No Dizziness?: No  Psychologic Depression?: No Anxiety?: No  Physical Exam: BP (!) 143/83   Pulse 81   Ht 5\' 1"  (1.549 m)   Wt 142 lb (64.4 kg)   BMI 26.83 kg/m   Constitutional:  Alert and oriented, No acute distress. HEENT: [place] AT, moist mucus membranes.  Trachea midline, no masses. Cardiovascular: No clubbing, cyanosis, or edema. CV RRR Respiratory: Normal respiratory effort, no increased work of breathing.  Lungs clear GI: Abdomen is soft, nontender, nondistended, no abdominal masses GU: No CVA tenderness.  Skin: No rashes, bruises or suspicious lesions. Lymph: No cervical or inguinal adenopathy. Neurologic: Grossly intact, no focal deficits, moving all 4 extremities. Psychiatric: Normal mood and affect.  Laboratory Data: Lab Results  Component Value Date   WBC 5.9 03/31/2017   HGB 9.7 (L) 03/31/2017   HCT 28.8 (L) 03/31/2017   MCV 98.6 03/31/2017    PLT 299 03/31/2017    Lab Results  Component Value Date   CREATININE 0.67 03/31/2017     Assessment & Plan:    1. Hydronephrosis, unspecified hydronephrosis type Reduction of mass-effect on the left ureter noted on recent CT.  She desires to have her stent removed.  I recommend scheduling removal under anesthesia with left retrograde pyelogram and a decision at that time of stent removal versus exchange.  The procedure was discussed including potential risks of bleeding and infection.  Her urinalysis today was nitrite positive and a urine culture was ordered.  She indicated all questions were answered and she desires to proceed.  - Urinalysis, Complete - CULTURE, URINE COMPREHENSIVE   Abbie Sons, MD  [facility] [address], [place], [place] [address] [phone]

## 2017-04-15 NOTE — Telephone Encounter (Signed)
Notified pt of surgery scheduled with Dr Bernardo Heater on 04/23/2017 & instructions given. Advised pt to continue Eliquis per Dr Bernardo Heater. Pt voices understanding & has no further questions at this time.

## 2017-04-15 NOTE — Progress Notes (Signed)
04/15/2017 3:49 PM   Vickie Barton 12-02-1930 [id number]  Referring provider: [name], MD [address] [facility] [place], [place] [address]  Chief Complaint  Patient presents with  . Hydronephrosis    HPI: 84 year old female presents for follow-up of left hydronephrosis.  She had a left hydronephrosis secondary to extrinsic ureteral obstruction from endometrial carcinoma.  She had placement of a left ureteral stent by Dr. Pilar Jarvis on 01/01/2017.  She is currently undergoing chemotherapy with carboplatinum and Taxol.  A CT performed on 03/26/2017 showed a left ureteral stent without hydronephrosis or hydroureter.  The degree of mass-effect of the left ureter from pelvic adenopathy was decreased.   PMH: Past Medical History:  Diagnosis Date  . Arthritis   . CHF (congestive heart failure) ([place])   . Constipation   . DVT (deep venous thrombosis) ([place])   . Endometrial cancer ([place]) 01/28/2016  . Hyperlipidemia   . Hypertension   . Hypothyroidism     Surgical History: Past Surgical History:  Procedure Laterality Date  . CYSTOSCOPY W/ RETROGRADES Left 01/01/2017   Procedure: CYSTOSCOPY WITH RETROGRADE PYELOGRAM;  Surgeon: Nickie Retort, MD;  Location: [facility];  Service: Urology;  Laterality: Left;  . CYSTOSCOPY WITH STENT PLACEMENT Left 01/01/2017   Procedure: CYSTOSCOPY WITH STENT PLACEMENT;  Surgeon: Nickie Retort, MD;  Location: [facility];  Service: Urology;  Laterality: Left;  . DILATION AND CURETTAGE OF UTERUS    . HYSTEROSCOPY W/D&C N/A 01/28/2016   Procedure: DILATATION AND CURETTAGE /HYSTEROSCOPY;  Surgeon: [name], MD;  Location: [facility];  Service: Gynecology;  Laterality: N/A;  . LAPAROSCOPIC BILATERAL SALPINGO OOPHERECTOMY Bilateral 02/26/2016   Procedure: LAPAROSCOPIC BILATERAL SALPINGO OOPHORECTOMY;  Surgeon: [name], MD;  Location: [facility];  Service: Gynecology;  Laterality: Bilateral;  . LAPAROSCOPIC HYSTERECTOMY N/A  02/26/2016   Procedure: HYSTERECTOMY TOTAL LAPAROSCOPIC;  Surgeon: [name], MD;  Location: [facility];  Service: Gynecology;  Laterality: N/A;  . SENTINEL NODE BIOPSY N/A 02/26/2016   Procedure: SENTINEL NODE BIOPSY;  Surgeon: [name], MD;  Location: [facility];  Service: Gynecology;  Laterality: N/A;  . TONSILLECTOMY      Home Medications:  Allergies as of 04/15/2017      Reactions   Sulfa Antibiotics Rash   Zinc Rash      Medication List        Accurate as of 04/15/17  3:49 PM. Always use your most recent med list.          ELIQUIS 5 MG Tabs tablet Generic drug:  apixaban Take 5 mg by mouth 2 (two) times daily.   furosemide 20 MG tablet Commonly known as:  LASIX Take 20 mg by mouth 2 (two) times daily.   KLOR-CON 10 10 MEQ tablet Generic drug:  potassium chloride Take 10 mEq by mouth daily. with food   levothyroxine 150 MCG tablet Commonly known as:  SYNTHROID, LEVOTHROID TAKE 1TAB DAILY BEFORE BREAKFAST ON EMPTY STOMACH WITH A GLASS OF WATER 30-60MINS BEFORE BREAKFAST   Oxycodone HCl 10 MG Tabs Take 1 tablet (10 mg total) by mouth every 4 (four) hours as needed.   prochlorperazine 10 MG tablet Commonly known as:  COMPAZINE Take 1 tablet (10 mg total) by mouth every 6 (six) hours as needed (Nausea or vomiting).       Allergies:  Allergies  Allergen Reactions  . Sulfa Antibiotics Rash  . Zinc Rash    Family History: Family History  Problem Relation  Age of Onset  . Diabetes Father   . Heart disease Father   . Hypertension Father   . Diabetes Paternal Grandmother   . Heart disease Mother   . Hypertension Mother   . Stroke Mother   . Hypertension Sister   . Thyroid disease Sister   . Hypertension Brother     Social History:  reports that she has quit smoking. she has never used smokeless tobacco. She reports that she does not drink alcohol or use drugs.  ROS: UROLOGY Frequent Urination?: No Hard to postpone urination?: No Burning/pain  with urination?: No Get up at night to urinate?: No Leakage of urine?: No Urine stream starts and stops?: No Trouble starting stream?: No Do you have to strain to urinate?: No Blood in urine?: No Urinary tract infection?: No Sexually transmitted disease?: No Injury to kidneys or bladder?: No Painful intercourse?: No Weak stream?: No Currently pregnant?: No Vaginal bleeding?: No Last menstrual period?: n  Gastrointestinal Nausea?: No Vomiting?: No Indigestion/heartburn?: No Diarrhea?: No Constipation?: No  Constitutional Fever: No Night sweats?: No Weight loss?: No Fatigue?: No  Skin Skin rash/lesions?: No Itching?: No  Eyes Blurred vision?: No Double vision?: No  Ears/Nose/Throat Sore throat?: No Sinus problems?: No  Hematologic/Lymphatic Swollen glands?: No Easy bruising?: No  Cardiovascular Leg swelling?: Yes Chest pain?: No  Respiratory Cough?: No Shortness of breath?: No  Endocrine Excessive thirst?: No  Musculoskeletal Back pain?: No Joint pain?: No  Neurological Headaches?: No Dizziness?: No  Psychologic Depression?: No Anxiety?: No  Physical Exam: BP (!) 143/83   Pulse 81   Ht 5\' 1"  (1.549 m)   Wt 142 lb (64.4 kg)   BMI 26.83 kg/m   Constitutional:  Alert and oriented, No acute distress. HEENT: [place] AT, moist mucus membranes.  Trachea midline, no masses. Cardiovascular: No clubbing, cyanosis, or edema. CV RRR Respiratory: Normal respiratory effort, no increased work of breathing.  Lungs clear GI: Abdomen is soft, nontender, nondistended, no abdominal masses GU: No CVA tenderness.  Skin: No rashes, bruises or suspicious lesions. Lymph: No cervical or inguinal adenopathy. Neurologic: Grossly intact, no focal deficits, moving all 4 extremities. Psychiatric: Normal mood and affect.  Laboratory Data: Lab Results  Component Value Date   WBC 5.9 03/31/2017   HGB 9.7 (L) 03/31/2017   HCT 28.8 (L) 03/31/2017   MCV 98.6 03/31/2017    PLT 299 03/31/2017    Lab Results  Component Value Date   CREATININE 0.67 03/31/2017     Assessment & Plan:    1. Hydronephrosis, unspecified hydronephrosis type Reduction of mass-effect on the left ureter noted on recent CT.  She desires to have her stent removed.  I recommend scheduling removal under anesthesia with left retrograde pyelogram and a decision at that time of stent removal versus exchange.  The procedure was discussed including potential risks of bleeding and infection.  Her urinalysis today was nitrite positive and a urine culture was ordered.  She indicated all questions were answered and she desires to proceed.  - Urinalysis, Complete - CULTURE, URINE COMPREHENSIVE   Abbie Sons, MD  [facility] [address] [place], [place] [address] [phone]

## 2017-04-18 LAB — CULTURE, URINE COMPREHENSIVE

## 2017-04-18 NOTE — Progress Notes (Signed)
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  INTERVAL HISTORY: Patient returns to clinic today for further evaluation and consideration of cycle 6 of carboplatinum and Taxol. She states she has had a more difficult time recovering from chemo this past treatment and has increased weakness and fatigue.  She has a decreased appetite and weight loss.  Her pain has resolved and she is no longer taking oxycodone. The swelling of her left lower extremity is essentially unchanged but significantly improved since initiating treatment. She has no neurologic complaints. She denies any recent fevers or illnesses. She denies any chest pain or shortness of breath. She denies any nausea, vomiting, constipation, or diarrhea.  She has no urinary complaints. Patient offers no further specific complaints.  REVIEW OF SYSTEMS:   Review of Systems  Constitutional: Positive for malaise/fatigue and weight loss. Negative for fever.  Respiratory: Negative.  Negative for cough and shortness of breath.   Cardiovascular: Positive for leg swelling. Negative for chest pain.  Gastrointestinal: Negative for abdominal pain, constipation, nausea and vomiting.  Genitourinary: Negative.   Musculoskeletal: Negative.   Skin: Negative.  Negative for rash.  Neurological: Positive for weakness.  Psychiatric/Behavioral: Negative.  The patient is not nervous/anxious.     As per HPI. Otherwise, a complete review of systems is negative.  PAST MEDICAL HISTORY: Past Medical History:  Diagnosis Date  . Arthritis   . CHF (congestive heart failure) ([place])   . Constipation   . DVT (deep venous thrombosis) ([place]) 03/2017   left leg  . Endometrial cancer ([place])  01/28/2016  . Hyperlipidemia   . Hypertension   . Hypothyroidism     PAST SURGICAL HISTORY: Past Surgical History:  Procedure Laterality Date  . CYSTOSCOPY W/ RETROGRADES Left 01/01/2017   Procedure: CYSTOSCOPY WITH RETROGRADE PYELOGRAM;  Surgeon: Nickie Retort, MD;  Location: [facility];  Service: Urology;  Laterality: Left;  . CYSTOSCOPY WITH STENT PLACEMENT Left 01/01/2017   Procedure: CYSTOSCOPY WITH STENT PLACEMENT;  Surgeon: Nickie Retort, MD;  Location: [facility];  Service: Urology;  Laterality: Left;  . DILATION AND CURETTAGE OF UTERUS    . HYSTEROSCOPY W/D&C N/A 01/28/2016   Procedure: DILATATION AND CURETTAGE /HYSTEROSCOPY;  Surgeon: [name], MD;  Location: [facility];  Service: Gynecology;  Laterality: N/A;  . LAPAROSCOPIC BILATERAL SALPINGO OOPHERECTOMY Bilateral 02/26/2016   Procedure: LAPAROSCOPIC BILATERAL SALPINGO OOPHORECTOMY;  Surgeon: [name], MD;  Location: [facility];  Service: Gynecology;  Laterality: Bilateral;  . LAPAROSCOPIC HYSTERECTOMY N/A 02/26/2016   Procedure: HYSTERECTOMY TOTAL LAPAROSCOPIC;  Surgeon: [name], MD;  Location: [facility];  Service: Gynecology;  Laterality: N/A;  . SENTINEL NODE BIOPSY N/A 02/26/2016   Procedure: SENTINEL NODE BIOPSY;  Surgeon: [name], MD;  Location: [facility];  Service: Gynecology;  Laterality: N/A;  . TONSILLECTOMY      FAMILY HISTORY: Family History  Problem Relation Age of Onset  . Diabetes Father   . Heart disease Father   . Hypertension Father   . Stroke Father   . Diabetes Paternal Grandmother   . Heart disease Mother   . Hypertension Mother   . Stroke Mother   . Hypertension Sister   . Thyroid disease Sister   . Hypertension Brother     ADVANCED DIRECTIVES (  Y/N):  N  HEALTH MAINTENANCE: Social History   Tobacco Use  . Smoking status: Former Research scientist (life sciences)  . Smokeless tobacco: Never Used  Substance Use Topics  . Alcohol use: No  . Drug use: No     Colonoscopy:  PAP:  Bone  density:  Lipid panel:  Allergies  Allergen Reactions  . Sulfa Antibiotics Rash  . Zinc Rash    Current Outpatient Medications  Medication Sig Dispense Refill  . ELIQUIS 5 MG TABS tablet Take 5 mg by mouth 2 (two) times daily.  0  . furosemide (LASIX) 20 MG tablet Take 20 mg by mouth 2 (two) times daily.  0  . KLOR-CON 10 10 MEQ tablet Take 10 mEq by mouth daily. with food  0  . levothyroxine (SYNTHROID, LEVOTHROID) 150 MCG tablet TAKE 150 MCG BY MOUTH DAILY BEFORE BREAKFAST ON EMPTY STOMACH WITH A GLASS OF WATER 30-60MINS BEFORE BREAKFAST  2  . lidocaine (LIDODERM) 5 % Place 1 patch onto the skin daily as needed (for pain). Remove & Discard patch within 12 hours or as directed by MD    . Oxycodone HCl 10 MG TABS Take 1 tablet (10 mg total) by mouth every 4 (four) hours as needed. (Patient taking differently: Take 10 mg by mouth every 4 (four) hours as needed (for pain). ) 60 tablet 0  . prochlorperazine (COMPAZINE) 10 MG tablet Take 1 tablet (10 mg total) by mouth every 6 (six) hours as needed (Nausea or vomiting). 60 tablet 2   No current facility-administered medications for this visit.      OBJECTIVE: Vitals:   04/21/17 0909  BP: (!) 146/88  Pulse: 80  Resp: 20  Temp: (!) 97 F (36.1 C)     Body mass index is 25.04 kg/m.    ECOG FS:2 - Symptomatic, <50% confined to bed  General: Sitting in a wheelchair, no acute distress. Eyes: Pink conjunctiva, anicteric sclera. Lungs: Clear to auscultation bilaterally. Heart: Regular rate and rhythm. No rubs, murmurs, or gallops. Abdomen: Soft, nontender, nondistended. No organomegaly noted, normoactive bowel sounds. Musculoskeletal: 2-3+ left leg lymphedema.  Neuro: Alert, answering all questions appropriately. Cranial nerves grossly intact. Skin: No rashes or petechiae noted. Psych: Normal affect.   LAB RESULTS:  Lab Results  Component Value Date   NA 137 04/21/2017   K 3.3 (L) 04/21/2017   CL 100 (L) 04/21/2017   CO2 24  04/21/2017   GLUCOSE 138 (H) 04/21/2017   BUN 13 04/21/2017   CREATININE 0.91 04/21/2017   CALCIUM 9.0 04/21/2017   PROT 7.1 04/21/2017   ALBUMIN 3.5 04/21/2017   AST 28 04/21/2017   ALT 11 (L) 04/21/2017   ALKPHOS 73 04/21/2017   BILITOT 0.5 04/21/2017   GFRNONAA 56 (L) 04/21/2017   GFRAA >60 04/21/2017    Lab Results  Component Value Date   WBC 4.7 04/21/2017   NEUTROABS 3.2 04/21/2017   HGB 10.4 (L) 04/21/2017   HCT 30.8 (L) 04/21/2017   MCV 99.6 04/21/2017   PLT 276 04/21/2017     STUDIES: Ct Chest W Contrast  Result Date: 03/26/2017 CLINICAL DATA:  Endometrial cancer restaging, recent completion of chemotherapy. EXAM: CT CHEST, ABDOMEN, AND PELVIS WITH CONTRAST TECHNIQUE: Multidetector CT imaging of the chest, abdomen and pelvis was performed following the standard protocol during bolus administration of intravenous contrast. CONTRAST:  113mL ISOVUE-300 IOPAMIDOL (ISOVUE-300) INJECTION 61% COMPARISON:  Multiple exams, including 12/25/2016 and 11/04/2016 FINDINGS: CT CHEST FINDINGS Cardiovascular: Coronary, aortic arch, and branch vessel atherosclerotic vascular  disease. Small amounts of residual chronic bilateral pulmonary embolus including a flap in the left lower lobe pulmonary artery on image 84/4, and a smaller web or flap in the right lower lobe pulmonary artery on image 75/4. Mediastinum/Nodes: Right lower paratracheal lymph node 1.0 cm in short axis on image 19/2, previously 1.2 cm. Lungs/Pleura: Biapical pleuroparenchymal scarring. There is bandlike scarring in the posterior basal segment right lower lobe. Mild scarring in the left lower lobe. Cylindrical bronchiectasis in both lower lobes. Musculoskeletal: Mild sclerosis anteriorly in the right eighth rib shown on image 105/6 favoring a healing fracture. CT ABDOMEN PELVIS FINDINGS Hepatobiliary: Unremarkable Pancreas: Unremarkable Spleen: Stable small splenic hypodense lesion measuring 1.0 by 1.1 cm on image 53/2, this  lesion is nearly isodense on the delayed images. The perisplenic fluid collection shown on the CT from 12/25/2016 has resolved. Adrenals/Urinary Tract: Stable right renal cysts. 2 mm left kidney lower pole nonobstructive renal calculus. Left double-J ureteral stent in satisfactory position without hydronephrosis or hydroureter. The degree of mass effect on the left ureter due to pelvic adenopathy is reduced. Stomach/Bowel: Prominent stool throughout the colon favors constipation. Mild sigmoid colon diverticulosis. Vascular/Lymphatic: Aortoiliac atherosclerotic vascular disease. IVC filter satisfactorily positioned. Precaval node just above the level of the bifurcation measures 1.1 cm in short axis on image 69/2 (formerly 3.5 cm). A left obturator or external iliac lymph node measures 5.2 by 6.0 cm on image 90/2, and was formerly 5.9 by 8.9 cm by my measurements. Reduced mass effect on the left ureter and iliac vessels although there continues to be some effacement of the left external iliac vein. Reproductive: Uterus absent. Left adnexa not readily separable from the suspected left pelvic sidewall adenopathy. Other: No supplemental non-categorized findings. Musculoskeletal: Lumbar spondylosis and degenerative disc disease. Mild left foraminal impingement at L4-5 due to spurring. Indistinct nodularity within the left gluteus maximus muscle laterally measuring about 1.8 by 1.4 cm on image 94/2, previously 2.1 by 1.2 cm, significance uncertain. IMPRESSION: 1. Improved appearance of adenopathy in the pelvis, abdomen, and chest. Reduced mass effect from the large left pelvic sidewall lymph node which currently measures 5.2 by 6.0 cm. A left ureteral stent remains in place and there is no overt left hydronephrosis. 2. Small flaps or webs in the right and left lower lobe pulmonary arteries compatible with chronic pulmonary embolus. 3. Indistinct nodularity in the left gluteus maximus muscle measuring 1.8 by 1.4 cm,  essentially stable, potentially an intramuscular lymph node. 4. Other imaging findings of potential clinical significance: Aortic Atherosclerosis (ICD10-I70.0). Coronary atherosclerosis. Healing right eighth rib fracture anteriorly. Likely benign splenic hypodense lesion. 2 mm nonobstructive left kidney lower pole calculus. Prominent stool throughout the colon favors constipation. Mild sigmoid diverticulosis. IVC filter in place. Mild impingement at L4-5. Electronically Signed   By: Van Clines M.D.   On: 03/26/2017 14:49   Ct Abdomen Pelvis W Contrast  Result Date: 03/26/2017 CLINICAL DATA:  Endometrial cancer restaging, recent completion of chemotherapy. EXAM: CT CHEST, ABDOMEN, AND PELVIS WITH CONTRAST TECHNIQUE: Multidetector CT imaging of the chest, abdomen and pelvis was performed following the standard protocol during bolus administration of intravenous contrast. CONTRAST:  134mL ISOVUE-300 IOPAMIDOL (ISOVUE-300) INJECTION 61% COMPARISON:  Multiple exams, including 12/25/2016 and 11/04/2016 FINDINGS: CT CHEST FINDINGS Cardiovascular: Coronary, aortic arch, and branch vessel atherosclerotic vascular disease. Small amounts of residual chronic bilateral pulmonary embolus including a flap in the left lower lobe pulmonary artery on image 84/4, and a smaller web or flap in the right lower lobe  pulmonary artery on image 75/4. Mediastinum/Nodes: Right lower paratracheal lymph node 1.0 cm in short axis on image 19/2, previously 1.2 cm. Lungs/Pleura: Biapical pleuroparenchymal scarring. There is bandlike scarring in the posterior basal segment right lower lobe. Mild scarring in the left lower lobe. Cylindrical bronchiectasis in both lower lobes. Musculoskeletal: Mild sclerosis anteriorly in the right eighth rib shown on image 105/6 favoring a healing fracture. CT ABDOMEN PELVIS FINDINGS Hepatobiliary: Unremarkable Pancreas: Unremarkable Spleen: Stable small splenic hypodense lesion measuring 1.0 by 1.1 cm on  image 53/2, this lesion is nearly isodense on the delayed images. The perisplenic fluid collection shown on the CT from 12/25/2016 has resolved. Adrenals/Urinary Tract: Stable right renal cysts. 2 mm left kidney lower pole nonobstructive renal calculus. Left double-J ureteral stent in satisfactory position without hydronephrosis or hydroureter. The degree of mass effect on the left ureter due to pelvic adenopathy is reduced. Stomach/Bowel: Prominent stool throughout the colon favors constipation. Mild sigmoid colon diverticulosis. Vascular/Lymphatic: Aortoiliac atherosclerotic vascular disease. IVC filter satisfactorily positioned. Precaval node just above the level of the bifurcation measures 1.1 cm in short axis on image 69/2 (formerly 3.5 cm). A left obturator or external iliac lymph node measures 5.2 by 6.0 cm on image 90/2, and was formerly 5.9 by 8.9 cm by my measurements. Reduced mass effect on the left ureter and iliac vessels although there continues to be some effacement of the left external iliac vein. Reproductive: Uterus absent. Left adnexa not readily separable from the suspected left pelvic sidewall adenopathy. Other: No supplemental non-categorized findings. Musculoskeletal: Lumbar spondylosis and degenerative disc disease. Mild left foraminal impingement at L4-5 due to spurring. Indistinct nodularity within the left gluteus maximus muscle laterally measuring about 1.8 by 1.4 cm on image 94/2, previously 2.1 by 1.2 cm, significance uncertain. IMPRESSION: 1. Improved appearance of adenopathy in the pelvis, abdomen, and chest. Reduced mass effect from the large left pelvic sidewall lymph node which currently measures 5.2 by 6.0 cm. A left ureteral stent remains in place and there is no overt left hydronephrosis. 2. Small flaps or webs in the right and left lower lobe pulmonary arteries compatible with chronic pulmonary embolus. 3. Indistinct nodularity in the left gluteus maximus muscle measuring 1.8  by 1.4 cm, essentially stable, potentially an intramuscular lymph node. 4. Other imaging findings of potential clinical significance: Aortic Atherosclerosis (ICD10-I70.0). Coronary atherosclerosis. Healing right eighth rib fracture anteriorly. Likely benign splenic hypodense lesion. 2 mm nonobstructive left kidney lower pole calculus. Prominent stool throughout the colon favors constipation. Mild sigmoid diverticulosis. IVC filter in place. Mild impingement at L4-5. Electronically Signed   By: Van Clines M.D.   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  PLAN:    1. 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 Adjuvant treatment was recommended at that time, but patient refused on multiple occations and missed multiple follow-up appointments in the interim. She has now agreed to attempt adjuvant chemotherapy using carboplatinum and Taxol every 3 weeks with Neulasta support. Patient continues to decline port placement.  CT scan results from March 26, 2017 reviewed independently and report as above with significant improvement in patient's disease burden.  Previously, carboplatinum and Taxol were dose reduced from the onset given her decreased performance status.  Proceed with cycle 6 of carboplatinum and Taxol today with Neulasta support.  Given patient's declining performance status, will hold any further treatments at this time and reimage  in 4 weeks.  We will determine whether additional treatment is necessary based on the results of of her scans.     2. Pain: Resolved.  Patient is no longer taking narcotics.   3. Hypokalemia:  Continue oral supplementation. 4. Lymphedema: Improving, monitor. 5.  Anemia: Mild, monitor. 6.  Poor appetite/weight loss: Hold treatments at this time.  Consider dietary referral in the future.  Approximately 30  minutes was spent in discussion of which greater than 50% was consultation.  Patient expressed understanding and was in agreement with this plan. She also understands that She can call clinic at any time with any questions, concerns, or complaints.   Cancer Staging No matching staging information was found for the patient.   [name], MD   04/21/2017 9:23 AM

## 2017-04-20 ENCOUNTER — Other Ambulatory Visit: Payer: Self-pay

## 2017-04-20 ENCOUNTER — Encounter
Admission: RE | Admit: 2017-04-20 | Discharge: 2017-04-20 | Disposition: A | Discharge status: Home / Self Care | Payer: Medicare Other | Source: Ambulatory Visit | Attending: Urology | Admitting: Urology

## 2017-04-20 NOTE — Patient Instructions (Signed)
Your procedure is scheduled on: Friday, April 23, 2017 Report to Same Day Surgery on the 2nd floor in the [place]. To find out your arrival time, please call [phone] between 1PM - 3PM on: Thursday, April 22, 2017  REMEMBER: Instructions that are not followed completely may result in serious medical risk, up to and including death; or upon the discretion of your surgeon and anesthesiologist your surgery may need to be rescheduled.  Do not eat food after midnight the night before your procedure.  No gum chewing or hard candies.  You may however, drink CLEAR liquids up to 2 hours before you are scheduled to arrive at the hospital for your procedure.  Do not drink clear liquids within 2 hours of the start of your surgery.  Clear liquids include: - water  - apple juice without pulp - clear gatorade - black coffee or tea (Do NOT add anything to the coffee or tea) Do NOT drink anything that is not on this list.  No Alcohol for 24 hours before or after surgery.  No Smoking including e-cigarettes for 24 hours prior to surgery. No chewable tobacco products for at least 6 hours prior to surgery. No nicotine patches on the day of surgery.  On the morning of surgery brush your teeth with toothpaste and water, you may rinse your mouth with mouthwash if you wish. Do not swallow any  toothpaste of mouthwash.  Notify your doctor if there is any change in your medical condition (cold, fever, infection).  Do not wear jewelry, make-up, hairpins, clips or nail polish.  Do not wear lotions, powders, or perfumes. You may wear deodorant.  Do not shave 48 hours prior to surgery.  Contacts and dentures may not be worn into surgery.  Do not bring valuables to the hospital. [facility] is not responsible for any belongings or valuables.  TAKE THESE MEDICATIONS THE MORNING OF SURGERY WITH A SIP OF WATER:  1.  levothyroxine  Follow recommendations from Cardiologist, Pulmonologist or PCP  regarding stopping Eliquis. (STOP NOW!)  NOW!  Stop Anti-inflammatories such as Advil, Aleve, Ibuprofen, Motrin, Naproxen, Naprosyn, Goodie powder, or aspirin products. (May take Tylenol or Acetaminophen if needed.)  NOW!  Stop ANY OVER THE COUNTER supplements until after surgery.  If you are being discharged the day of surgery, you will not be allowed to drive home. You will need someone to drive you home and stay with you that night.   If you are taking public transportation, you will need to have a responsible adult to with you.  Please call the number above if you have any questions about these instructions.

## 2017-04-21 ENCOUNTER — Inpatient Hospital Stay (HOSPITAL_BASED_OUTPATIENT_CLINIC_OR_DEPARTMENT_OTHER): Payer: Medicare Other | Admitting: Oncology

## 2017-04-21 ENCOUNTER — Telehealth: Payer: Self-pay | Admitting: Radiology

## 2017-04-21 ENCOUNTER — Inpatient Hospital Stay: Payer: Medicare Other

## 2017-04-21 ENCOUNTER — Other Ambulatory Visit: Payer: Self-pay | Admitting: *Deleted

## 2017-04-21 ENCOUNTER — Encounter: Payer: Self-pay | Admitting: Oncology

## 2017-04-21 VITALS — BP 146/88 | HR 80 | Temp 97.0°F | Resp 20 | Wt 132.5 lb

## 2017-04-21 VITALS — BP 138/80 | HR 80 | Resp 20

## 2017-04-21 DIAGNOSIS — R5383 Other fatigue: Secondary | ICD-10-CM

## 2017-04-21 DIAGNOSIS — Z87891 Personal history of nicotine dependence: Secondary | ICD-10-CM

## 2017-04-21 DIAGNOSIS — C541 Malignant neoplasm of endometrium: Secondary | ICD-10-CM

## 2017-04-21 DIAGNOSIS — D649 Anemia, unspecified: Secondary | ICD-10-CM

## 2017-04-21 DIAGNOSIS — R634 Abnormal weight loss: Secondary | ICD-10-CM | POA: Diagnosis not present

## 2017-04-21 DIAGNOSIS — R630 Anorexia: Secondary | ICD-10-CM | POA: Diagnosis not present

## 2017-04-21 DIAGNOSIS — R531 Weakness: Secondary | ICD-10-CM | POA: Diagnosis not present

## 2017-04-21 DIAGNOSIS — E876 Hypokalemia: Secondary | ICD-10-CM | POA: Diagnosis not present

## 2017-04-21 DIAGNOSIS — Z5111 Encounter for antineoplastic chemotherapy: Secondary | ICD-10-CM | POA: Diagnosis not present

## 2017-04-21 DIAGNOSIS — I890 Lymphedema, not elsewhere classified: Secondary | ICD-10-CM

## 2017-04-21 LAB — COMPREHENSIVE METABOLIC PANEL
ALT: 11 U/L — ABNORMAL LOW (ref 14–54)
AST: 28 U/L (ref 15–41)
Albumin: 3.5 g/dL (ref 3.5–5.0)
Alkaline Phosphatase: 73 U/L (ref 38–126)
Anion gap: 13 (ref 5–15)
BUN: 13 mg/dL (ref 6–20)
CHLORIDE: 100 mmol/L — AB (ref 101–111)
CO2: 24 mmol/L (ref 22–32)
Calcium: 9 mg/dL (ref 8.9–10.3)
Creatinine, Ser: 0.91 mg/dL (ref 0.44–1.00)
GFR, EST NON AFRICAN AMERICAN: 56 mL/min — AB (ref >60)
Glucose, Bld: 138 mg/dL — ABNORMAL HIGH (ref 65–99)
POTASSIUM: 3.3 mmol/L — AB (ref 3.5–5.1)
Sodium: 137 mmol/L (ref 135–145)
Total Bilirubin: 0.5 mg/dL (ref 0.3–1.2)
Total Protein: 7.1 g/dL (ref 6.5–8.1)

## 2017-04-21 LAB — CBC WITH DIFFERENTIAL/PLATELET
Basophils Absolute: 0.1 10*3/uL (ref 0–0.1)
Basophils Relative: 1 %
Eosinophils Absolute: 0 10*3/uL (ref 0–0.7)
Eosinophils Relative: 0 %
HEMATOCRIT: 30.8 % — AB (ref 35.0–47.0)
HEMOGLOBIN: 10.4 g/dL — AB (ref 12.0–16.0)
LYMPHS ABS: 1 10*3/uL (ref 1.0–3.6)
Lymphocytes Relative: 21 %
MCH: 33.5 pg (ref 26.0–34.0)
MCHC: 33.6 g/dL (ref 32.0–36.0)
MCV: 99.6 fL (ref 80.0–100.0)
MONOS PCT: 8 %
Monocytes Absolute: 0.4 10*3/uL (ref 0.2–0.9)
NEUTROS ABS: 3.2 10*3/uL (ref 1.4–6.5)
NEUTROS PCT: 70 %
Platelets: 276 10*3/uL (ref 150–440)
RBC: 3.09 MIL/uL — AB (ref 3.80–5.20)
RDW: 15.3 % — ABNORMAL HIGH (ref 11.5–14.5)
WBC: 4.7 10*3/uL (ref 3.6–11.0)

## 2017-04-21 MED ORDER — OXYCODONE HCL 10 MG PO TABS: 10.0000 mg | ORAL_TABLET | ORAL | 0 refills | Status: DC | PRN

## 2017-04-21 MED ORDER — CARBOPLATIN CHEMO INJECTION 450 MG/45ML
280.0000 mg | Freq: Once | INTRAVENOUS | Status: AC
  Administered 2017-04-21: 280 mg via INTRAVENOUS
  Filled 2017-04-21: qty 28

## 2017-04-21 MED ORDER — DEXAMETHASONE SODIUM PHOSPHATE 10 MG/ML IJ SOLN
10.0000 mg | Freq: Once | INTRAMUSCULAR | Status: AC
  Administered 2017-04-21: 10 mg via INTRAVENOUS
  Filled 2017-04-21: qty 1

## 2017-04-21 MED ORDER — SODIUM CHLORIDE 0.9 % IV SOLN
Freq: Once | INTRAVENOUS | Status: AC
  Administered 2017-04-21: 10:00:00 via INTRAVENOUS
  Filled 2017-04-21: qty 1000

## 2017-04-21 MED ORDER — SODIUM CHLORIDE 0.9 % IV SOLN
150.0000 mg/m2 | Freq: Once | INTRAVENOUS | Status: AC
  Administered 2017-04-21: 258 mg via INTRAVENOUS
  Filled 2017-04-21: qty 43

## 2017-04-21 MED ORDER — PEGFILGRASTIM 6 MG/0.6ML ~~LOC~~ PSKT
6.0000 mg | PREFILLED_SYRINGE | Freq: Once | SUBCUTANEOUS | Status: AC
  Administered 2017-04-21: 6 mg via SUBCUTANEOUS
  Filled 2017-04-21: qty 0.6

## 2017-04-21 MED ORDER — PALONOSETRON HCL INJECTION 0.25 MG/5ML
0.2500 mg | Freq: Once | INTRAVENOUS | Status: AC
  Administered 2017-04-21: 0.25 mg via INTRAVENOUS
  Filled 2017-04-21: qty 5

## 2017-04-21 MED ORDER — DIPHENHYDRAMINE HCL 50 MG/ML IJ SOLN
25.0000 mg | Freq: Once | INTRAMUSCULAR | Status: AC
  Administered 2017-04-21: 25 mg via INTRAVENOUS
  Filled 2017-04-21: qty 1

## 2017-04-21 MED ORDER — FAMOTIDINE IN NACL 20-0.9 MG/50ML-% IV SOLN
20.0000 mg | Freq: Once | INTRAVENOUS | Status: AC
  Administered 2017-04-21: 20 mg via INTRAVENOUS
  Filled 2017-04-21: qty 50

## 2017-04-21 NOTE — Progress Notes (Signed)
Patient reports decreased appetite today, 10 lb weight loss since last visit.

## 2017-04-21 NOTE — Telephone Encounter (Signed)
LMOM that potassium level drawn at pre-admission testing appt was decreased at 3.3 & per Dr Bernardo Heater pt needs to increase dietary potassium & also notify Dr who prescribes Klor-Con so dose can be adjusted if necessary.

## 2017-04-22 MED ORDER — CEFAZOLIN SODIUM-DEXTROSE 2-4 GM/100ML-% IV SOLN
2.0000 g | INTRAVENOUS | Status: AC
  Administered 2017-04-23: 2 g via INTRAVENOUS

## 2017-04-23 ENCOUNTER — Encounter: Payer: Self-pay | Admitting: Anesthesiology

## 2017-04-23 ENCOUNTER — Ambulatory Visit: Payer: Medicare Other | Admitting: Anesthesiology

## 2017-04-23 ENCOUNTER — Other Ambulatory Visit: Payer: Self-pay

## 2017-04-23 ENCOUNTER — Encounter
Admission: RE | Disposition: A | Discharge status: Home / Self Care | Payer: Self-pay | Source: Ambulatory Visit | Attending: Urology

## 2017-04-23 ENCOUNTER — Ambulatory Visit
Admission: RE | Admit: 2017-04-23 | Discharge: 2017-04-23 | Disposition: A | Discharge status: Home / Self Care | Payer: Medicare Other | Source: Ambulatory Visit | Attending: Urology | Admitting: Urology

## 2017-04-23 DIAGNOSIS — N1330 Unspecified hydronephrosis: Secondary | ICD-10-CM | POA: Diagnosis not present

## 2017-04-23 DIAGNOSIS — Z79890 Hormone replacement therapy (postmenopausal): Secondary | ICD-10-CM | POA: Diagnosis not present

## 2017-04-23 DIAGNOSIS — M1990 Unspecified osteoarthritis, unspecified site: Secondary | ICD-10-CM | POA: Insufficient documentation

## 2017-04-23 DIAGNOSIS — Z79899 Other long term (current) drug therapy: Secondary | ICD-10-CM | POA: Insufficient documentation

## 2017-04-23 DIAGNOSIS — Z823 Family history of stroke: Secondary | ICD-10-CM | POA: Insufficient documentation

## 2017-04-23 DIAGNOSIS — Z466 Encounter for fitting and adjustment of urinary device: Secondary | ICD-10-CM | POA: Insufficient documentation

## 2017-04-23 DIAGNOSIS — Z9221 Personal history of antineoplastic chemotherapy: Secondary | ICD-10-CM | POA: Insufficient documentation

## 2017-04-23 DIAGNOSIS — Z87891 Personal history of nicotine dependence: Secondary | ICD-10-CM | POA: Diagnosis not present

## 2017-04-23 DIAGNOSIS — Z7901 Long term (current) use of anticoagulants: Secondary | ICD-10-CM | POA: Diagnosis not present

## 2017-04-23 DIAGNOSIS — I110 Hypertensive heart disease with heart failure: Secondary | ICD-10-CM | POA: Diagnosis not present

## 2017-04-23 DIAGNOSIS — E785 Hyperlipidemia, unspecified: Secondary | ICD-10-CM | POA: Diagnosis not present

## 2017-04-23 DIAGNOSIS — I509 Heart failure, unspecified: Secondary | ICD-10-CM | POA: Diagnosis not present

## 2017-04-23 DIAGNOSIS — Z8249 Family history of ischemic heart disease and other diseases of the circulatory system: Secondary | ICD-10-CM | POA: Insufficient documentation

## 2017-04-23 DIAGNOSIS — Z833 Family history of diabetes mellitus: Secondary | ICD-10-CM | POA: Insufficient documentation

## 2017-04-23 DIAGNOSIS — C541 Malignant neoplasm of endometrium: Secondary | ICD-10-CM | POA: Diagnosis not present

## 2017-04-23 DIAGNOSIS — Z882 Allergy status to sulfonamides status: Secondary | ICD-10-CM | POA: Insufficient documentation

## 2017-04-23 DIAGNOSIS — E039 Hypothyroidism, unspecified: Secondary | ICD-10-CM | POA: Diagnosis not present

## 2017-04-23 HISTORY — PX: CYSTOSCOPY W/ RETROGRADES: SHX1426

## 2017-04-23 HISTORY — PX: CYSTOSCOPY W/ URETERAL STENT PLACEMENT: SHX1429

## 2017-04-23 LAB — POTASSIUM: Potassium: 3.5 mmol/L (ref 3.5–5.1)

## 2017-04-23 SURGERY — CYSTOSCOPY, WITH RETROGRADE PYELOGRAM
Anesthesia: General | Laterality: Left | Wound class: Clean Contaminated

## 2017-04-23 MED ORDER — LIDOCAINE HCL (PF) 2 % IJ SOLN
INTRAMUSCULAR | Status: AC
  Filled 2017-04-23: qty 10

## 2017-04-23 MED ORDER — LACTATED RINGERS IV SOLN
INTRAVENOUS | Status: DC
  Administered 2017-04-23: 08:00:00 via INTRAVENOUS

## 2017-04-23 MED ORDER — FAMOTIDINE 20 MG PO TABS
ORAL_TABLET | ORAL | Status: AC
  Filled 2017-04-23: qty 1

## 2017-04-23 MED ORDER — PROPOFOL 10 MG/ML IV BOLUS
INTRAVENOUS | Status: DC | PRN
  Administered 2017-04-23: 140 mg via INTRAVENOUS

## 2017-04-23 MED ORDER — ONDANSETRON HCL 4 MG/2ML IJ SOLN
INTRAMUSCULAR | Status: DC | PRN
  Administered 2017-04-23: 4 mg via INTRAVENOUS

## 2017-04-23 MED ORDER — ONDANSETRON HCL 4 MG/2ML IJ SOLN: 4.0000 mg | Freq: Once | INTRAMUSCULAR | Status: DC | PRN

## 2017-04-23 MED ORDER — ONDANSETRON HCL 4 MG/2ML IJ SOLN
INTRAMUSCULAR | Status: AC
  Filled 2017-04-23: qty 2

## 2017-04-23 MED ORDER — FAMOTIDINE 20 MG PO TABS
20.0000 mg | ORAL_TABLET | Freq: Once | ORAL | Status: AC
  Administered 2017-04-23: 20 mg via ORAL

## 2017-04-23 MED ORDER — DEXAMETHASONE SODIUM PHOSPHATE 10 MG/ML IJ SOLN
INTRAMUSCULAR | Status: DC | PRN
  Administered 2017-04-23: 5 mg via INTRAVENOUS

## 2017-04-23 MED ORDER — PHENYLEPHRINE HCL 10 MG/ML IJ SOLN
INTRAMUSCULAR | Status: DC | PRN
  Administered 2017-04-23: 100 ug via INTRAVENOUS

## 2017-04-23 MED ORDER — FENTANYL CITRATE (PF) 100 MCG/2ML IJ SOLN
INTRAMUSCULAR | Status: DC | PRN
  Administered 2017-04-23: 50 ug via INTRAVENOUS

## 2017-04-23 MED ORDER — CEFAZOLIN SODIUM-DEXTROSE 2-4 GM/100ML-% IV SOLN
INTRAVENOUS | Status: AC
  Filled 2017-04-23: qty 100

## 2017-04-23 MED ORDER — FENTANYL CITRATE (PF) 100 MCG/2ML IJ SOLN: 25.0000 ug | INTRAMUSCULAR | Status: DC | PRN

## 2017-04-23 MED ORDER — LIDOCAINE HCL (CARDIAC) 20 MG/ML IV SOLN
INTRAVENOUS | Status: DC | PRN
  Administered 2017-04-23: 100 mg via INTRAVENOUS

## 2017-04-23 MED ORDER — DEXAMETHASONE SODIUM PHOSPHATE 10 MG/ML IJ SOLN
INTRAMUSCULAR | Status: AC
  Filled 2017-04-23: qty 1

## 2017-04-23 MED ORDER — FENTANYL CITRATE (PF) 100 MCG/2ML IJ SOLN
INTRAMUSCULAR | Status: AC
  Filled 2017-04-23: qty 2

## 2017-04-23 MED ORDER — SEVOFLURANE IN SOLN
RESPIRATORY_TRACT | Status: AC
  Filled 2017-04-23: qty 250

## 2017-04-23 MED ORDER — IOTHALAMATE MEGLUMINE 43 % IV SOLN
INTRAVENOUS | Status: DC | PRN
  Administered 2017-04-23: 26 mL

## 2017-04-23 SURGICAL SUPPLY — 18 items
BAG DRAIN CYSTO-URO LG1000N (MISCELLANEOUS) ×3 IMPLANT
BRUSH SCRUB EZ  4% CHG (MISCELLANEOUS) ×2
BRUSH SCRUB EZ 4% CHG (MISCELLANEOUS) ×1 IMPLANT
CATH URETL 5X70 OPEN END (CATHETERS) ×3 IMPLANT
CONRAY 43 FOR UROLOGY 50M (MISCELLANEOUS) ×3 IMPLANT
DRAPE UTILITY 15X26 TOWEL STRL (DRAPES) ×3 IMPLANT
GLOVE BIO SURGEON STRL SZ8 (GLOVE) ×3 IMPLANT
GOWN STRL REUS W/ TWL LRG LVL3 (GOWN DISPOSABLE) ×2 IMPLANT
GOWN STRL REUS W/TWL LRG LVL3 (GOWN DISPOSABLE) ×4
KIT TURNOVER CYSTO (KITS) ×3 IMPLANT
PACK CYSTO AR (MISCELLANEOUS) ×3 IMPLANT
SENSORWIRE 0.038 NOT ANGLED (WIRE) ×3
SET CYSTO W/LG BORE CLAMP LF (SET/KITS/TRAYS/PACK) ×3 IMPLANT
SOL .9 NS 3000ML IRR  AL (IV SOLUTION) ×2
SOL .9 NS 3000ML IRR UROMATIC (IV SOLUTION) ×1 IMPLANT
SURGILUBE 2OZ TUBE FLIPTOP (MISCELLANEOUS) ×3 IMPLANT
WATER STERILE IRR 1000ML POUR (IV SOLUTION) ×3 IMPLANT
WIRE SENSOR 0.038 NOT ANGLED (WIRE) ×1 IMPLANT

## 2017-04-23 NOTE — Anesthesia Postprocedure Evaluation (Signed)
Anesthesia Post Note  Patient: Mckynna Vanloan Breault  Procedure(s) Performed: CYSTOSCOPY WITH RETROGRADE PYELOGRAM (Left ) CYSTOSCOPY WITH STENT REMOVAL (Left )  Patient location during evaluation: PACU Anesthesia Type: General Level of consciousness: awake and alert Pain management: pain level controlled Vital Signs Assessment: post-procedure vital signs reviewed and stable Respiratory status: spontaneous breathing, nonlabored ventilation, respiratory function stable and patient connected to nasal cannula oxygen Cardiovascular status: blood pressure returned to baseline and stable Postop Assessment: no apparent nausea or vomiting Anesthetic complications: no     Last Vitals:  Vitals:   04/23/17 0942 04/23/17 0958  BP: (!) 154/69 (!) 157/72  Pulse: 73 73  Resp: 15 16  Temp: 36.7 C 36.8 C  SpO2: 100% 99%    Last Pain:  Vitals:   04/23/17 0958  TempSrc: Temporal  PainSc:                  Jordyn Hofacker S

## 2017-04-23 NOTE — OR Nursing (Signed)
patients left leg is swollen and she states very painful before coming to OR. Scd left off of this leg due to history of blood clot.

## 2017-04-23 NOTE — Discharge Instructions (Signed)

## 2017-04-23 NOTE — Anesthesia Post-op Follow-up Note (Signed)
Anesthesia QCDR form completed.        

## 2017-04-23 NOTE — Transfer of Care (Signed)
Immediate Anesthesia Transfer of Care Note  Patient: Vickie Barton  Procedure(s) Performed: CYSTOSCOPY WITH RETROGRADE PYELOGRAM (Left ) CYSTOSCOPY WITH STENT REMOVAL (Left )  Patient Location: PACU  Anesthesia Type:General  Level of Consciousness: sedated  Airway & Oxygen Therapy: Patient connected to face mask oxygen  Post-op Assessment: Post -op Vital signs reviewed and stable  Post vital signs: stable  Last Vitals:  Vitals:   04/23/17 0912 04/23/17 0913  BP: 138/70 138/70  Pulse: 72   Resp: 14   Temp: 36.7 C 36.7 C  SpO2: 100%     Last Pain:  Vitals:   04/23/17 0913  TempSrc: Temporal  PainSc:          Complications: No apparent anesthesia complications

## 2017-04-23 NOTE — Anesthesia Procedure Notes (Signed)
Procedure Name: LMA Insertion Date/Time: 04/23/2017 8:48 AM Performed by: Aline Brochure, CRNA Pre-anesthesia Checklist: Patient identified, Emergency Drugs available, Suction available and Patient being monitored Patient Re-evaluated:Patient Re-evaluated prior to induction Oxygen Delivery Method: Circle system utilized Preoxygenation: Pre-oxygenation with 100% oxygen Induction Type: IV induction Ventilation: Mask ventilation without difficulty LMA: LMA inserted LMA Size: 4.0 Number of attempts: 1 Placement Confirmation: positive ETCO2 and breath sounds checked- equal and bilateral Tube secured with: Tape Dental Injury: Teeth and Oropharynx as per pre-operative assessment

## 2017-04-23 NOTE — Op Note (Signed)
Preoperative diagnosis:  1. Left hydronephrosis secondary to extrinsic compression from metastatic endometrial carcinoma  Postoperative diagnosis:  1. Left hydronephrosis-resolved  Procedure: 1. Cystoscopy with removal left ureteral stent 2. Left retrograde pyelogram  Surgeon: John Giovanni, MD  Anesthesia: General  Complications: None  Intraoperative findings:  Left retrograde pyelogram-the left collecting system was normal without dilation or hydronephrosis.  The ureter was normal in caliber without dilation.  Under real-time fluoroscopy there was prompt emptying of contrast from the ureter with a small amount left in the collecting system.  EBL: Minimal  Specimens: None  Indication: Vickie Barton is a 84 y.o. patient with metastatic endometrial carcinoma and left ureteral obstruction.  She underwent placement of a left ureteral stent on 01/01/2017.  She has completed chemotherapy.  After reviewing the management options for treatment, he elected to proceed with the above surgical procedure(s). We have discussed the potential benefits and risks of the procedure, side effects of the proposed treatment, the likelihood of the patient achieving the goals of the procedure, and any potential problems that might occur during the procedure or recuperation. Informed consent has been obtained.  Description of procedure:  The patient was taken to the operating room and general anesthesia was induced.  The patient was placed in the dorsal lithotomy position, prepped and draped in the usual sterile fashion, and preoperative antibiotics were administered. A preoperative time-out was performed.   A 22 French cystoscope was lubricated and passed per urethra.  Panendoscopy was performed and the bladder mucosa showed no solid or papillary lesions.  There was mild edema of the left hemitrigone secondary to her indwelling stent.  The stent was grasped with endoscopic forceps and brought out to the  urethral meatus.  A 0.038 Sensor wire was placed through the stent and advanced up to the renal pelvis under fluoroscopy.  The ureteral stent was removed.  A 6 French open-ended ureteral catheter was placed over the wire and the guidewire was removed.  A pullout left retrograde pyelogram was performed with findings as described above.  Due to resolution of her hydronephrosis it was elected not to replace her stent.  The bladder was emptied and cystoscope was removed.  No significant bleeding was noted.  After anesthetic reversal the patient was taken to PACU in stable condition.  Temitayo Covalt C. Manaia Samad, M.D.

## 2017-04-23 NOTE — Interval H&P Note (Signed)
History and Physical Interval Note:  04/23/2017 8:26 AM  Vickie Barton  has presented today for surgery, with the diagnosis of left hydronephrosis  The various methods of treatment have been discussed with the patient and family. After consideration of risks, benefits and other options for treatment, the patient has consented to  Procedure(s): CYSTOSCOPY WITH RETROGRADE PYELOGRAM (Left) CYSTOSCOPY WITH STENT REPLACEMENT vs removal (Left) as a surgical intervention .  The patient's history has been reviewed, patient examined, no change in status, stable for surgery.  I have reviewed the patient's chart and labs.  Questions were answered to the patient's satisfaction.   CV RRR Lungs clear  Ronda Fairly [name]

## 2017-04-23 NOTE — Anesthesia Preprocedure Evaluation (Signed)
Anesthesia Evaluation  Patient identified by MRN, date of birth, ID band Patient awake    Reviewed: Allergy & Precautions, NPO status , Patient's Chart, lab work & pertinent test results, reviewed documented beta blocker date and time   Airway Mallampati: II  TM Distance: >3 FB     Dental  (+) Chipped   Pulmonary pneumonia, resolved, former smoker,           Cardiovascular hypertension, Pt. on medications +CHF       Neuro/Psych    GI/Hepatic   Endo/Other  Hypothyroidism   Renal/GU      Musculoskeletal  (+) Arthritis ,   Abdominal   Peds  Hematology   Anesthesia Other Findings   Reproductive/Obstetrics                             Anesthesia Physical Anesthesia Plan  ASA: III  Anesthesia Plan: General   Post-op Pain Management:    Induction: Intravenous  PONV Risk Score and Plan:   Airway Management Planned: LMA  Additional Equipment:   Intra-op Plan:   Post-operative Plan:   Informed Consent: I have reviewed the patients History and Physical, chart, labs and discussed the procedure including the risks, benefits and alternatives for the proposed anesthesia with the patient or authorized representative who has indicated his/her understanding and acceptance.     Plan Discussed with: CRNA  Anesthesia Plan Comments:         Anesthesia Quick Evaluation

## 2017-05-26 ENCOUNTER — Encounter
Admission: RE | Admit: 2017-05-26 | Discharge: 2017-05-26 | Disposition: A | Discharge status: Home / Self Care | Payer: Medicare Other | Source: Ambulatory Visit | Attending: Oncology | Admitting: Oncology

## 2017-05-26 DIAGNOSIS — C541 Malignant neoplasm of endometrium: Secondary | ICD-10-CM | POA: Insufficient documentation

## 2017-05-26 LAB — GLUCOSE, CAPILLARY: GLUCOSE-CAPILLARY: 90 mg/dL (ref 65–99)

## 2017-05-26 MED ORDER — FLUDEOXYGLUCOSE F - 18 (FDG) INJECTION
6.3900 | Freq: Once | INTRAVENOUS | Status: AC | PRN
  Administered 2017-05-26: 6.39 via INTRAVENOUS

## 2017-05-27 ENCOUNTER — Ambulatory Visit: Payer: Medicare Other | Admitting: Oncology

## 2017-05-27 ENCOUNTER — Other Ambulatory Visit: Payer: Medicare Other

## 2017-05-30 NOTE — Progress Notes (Signed)
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  INTERVAL HISTORY: Patient returns to clinic today for further evaluation and discussion of her imaging results.  She currently feels well.  She has had occasional episodes of nausea and vomiting since her last treatment, but none recently.  She continues to have occasional pain but this is significantly improved since initiating treatments. The swelling of her left lower extremity is essentially unchanged. She has no neurologic complaints. She denies any recent fevers or illnesses. She denies any chest pain or shortness of breath. She denies any constipation or diarrhea.  She has no urinary complaints. Patient offers no further specific complaints.  REVIEW OF SYSTEMS:   Review of Systems  Constitutional: Positive for malaise/fatigue. Negative for fever and weight loss.  Respiratory: Negative.  Negative for cough and shortness of breath.   Cardiovascular: Positive for leg swelling. Negative for chest pain.  Gastrointestinal: Positive for nausea and vomiting. Negative for abdominal pain and constipation.  Genitourinary: Negative.   Musculoskeletal: Positive for joint pain.  Skin: Negative.  Negative for rash.  Neurological: Positive for weakness. Negative for sensory change and focal weakness.  Psychiatric/Behavioral: Negative.  The patient is not nervous/anxious.     As per HPI. Otherwise, a complete review of systems is negative.  PAST MEDICAL HISTORY: Past Medical History:  Diagnosis Date  . Arthritis   . CHF (congestive heart failure) ([place])   . Constipation   . DVT (deep venous thrombosis) ([place]) 03/2017   left leg  . Endometrial cancer ([place]) 01/28/2016  .  Hyperlipidemia   . Hypertension   . Hypothyroidism     PAST SURGICAL HISTORY: Past Surgical History:  Procedure Laterality Date  . CYSTOSCOPY W/ RETROGRADES Left 01/01/2017   Procedure: CYSTOSCOPY WITH RETROGRADE PYELOGRAM;  Surgeon: Nickie Retort, MD;  Location: [facility];  Service: Urology;  Laterality: Left;  . CYSTOSCOPY W/ RETROGRADES Left 04/23/2017   Procedure: CYSTOSCOPY WITH RETROGRADE PYELOGRAM;  Surgeon: Abbie Sons, MD;  Location: [facility];  Service: Urology;  Laterality: Left;  . CYSTOSCOPY W/ URETERAL STENT PLACEMENT Left 04/23/2017   Procedure: CYSTOSCOPY WITH STENT REMOVAL;  Surgeon: Abbie Sons, MD;  Location: [facility];  Service: Urology;  Laterality: Left;  . CYSTOSCOPY WITH STENT PLACEMENT Left 01/01/2017   Procedure: CYSTOSCOPY WITH STENT PLACEMENT;  Surgeon: Nickie Retort, MD;  Location: [facility];  Service: Urology;  Laterality: Left;  . DILATION AND CURETTAGE OF UTERUS    . HYSTEROSCOPY W/D&C N/A 01/28/2016   Procedure: DILATATION AND CURETTAGE /HYSTEROSCOPY;  Surgeon: [name], MD;  Location: [facility];  Service: Gynecology;  Laterality: N/A;  . LAPAROSCOPIC BILATERAL SALPINGO OOPHERECTOMY Bilateral 02/26/2016   Procedure: LAPAROSCOPIC BILATERAL SALPINGO OOPHORECTOMY;  Surgeon: [name], MD;  Location: [facility];  Service: Gynecology;  Laterality: Bilateral;  . LAPAROSCOPIC HYSTERECTOMY N/A 02/26/2016   Procedure: HYSTERECTOMY TOTAL LAPAROSCOPIC;  Surgeon: [name], MD;  Location: [facility];  Service: Gynecology;  Laterality: N/A;  . SENTINEL NODE BIOPSY N/A 02/26/2016   Procedure: SENTINEL NODE BIOPSY;  Surgeon: [name], MD;  Location: [facility];  Service: Gynecology;  Laterality: N/A;  . TONSILLECTOMY      FAMILY HISTORY: Family History  Problem Relation Age of Onset  . Diabetes Father   .  Heart disease Father   . Hypertension Father   . Stroke Father   . Diabetes Paternal Grandmother   . Heart disease Mother   .  Hypertension Mother   . Stroke Mother   . Hypertension Sister   . Thyroid disease Sister   . Hypertension Brother     ADVANCED DIRECTIVES (Y/N):  N  HEALTH MAINTENANCE: Social History   Tobacco Use  . Smoking status: Former Research scientist (life sciences)  . Smokeless tobacco: Never Used  Substance Use Topics  . Alcohol use: No  . Drug use: No     Colonoscopy:  PAP:  Bone density:  Lipid panel:  Allergies  Allergen Reactions  . Sulfa Antibiotics Rash  . Zinc Rash    Current Outpatient Medications  Medication Sig Dispense Refill  . ELIQUIS 5 MG TABS tablet Take 5 mg by mouth 2 (two) times daily.  0  . furosemide (LASIX) 20 MG tablet Take 20 mg by mouth 2 (two) times daily.  0  . levothyroxine (SYNTHROID, LEVOTHROID) 150 MCG tablet TAKE 150 MCG BY MOUTH DAILY BEFORE BREAKFAST ON EMPTY STOMACH WITH A GLASS OF WATER 30-60MINS BEFORE BREAKFAST  2  . lidocaine (LIDODERM) 5 % Place 1 patch onto the skin daily as needed (for pain). Remove & Discard patch within 12 hours or as directed by MD    . Oxycodone HCl 10 MG TABS Take 1 tablet (10 mg total) by mouth every 4 (four) hours as needed. 60 tablet 0  . KLOR-CON 10 10 MEQ tablet Take 10 mEq by mouth daily. with food  0  . prochlorperazine (COMPAZINE) 10 MG tablet Take 1 tablet (10 mg total) by mouth every 6 (six) hours as needed (Nausea or vomiting). (Patient not taking: Reported on 06/02/2017) 60 tablet 2   No current facility-administered medications for this visit.      OBJECTIVE: Vitals:   06/02/17 1049  BP: (!) 147/85  Pulse: 75  Resp: 18  Temp: 98.9 F (37.2 C)     Body mass index is 24.83 kg/m.    ECOG FS:1 - Symptomatic but completely ambulatory  General: Sitting in a wheelchair, no acute distress. Eyes: Pink conjunctiva, anicteric sclera. Lungs: Clear to auscultation bilaterally. Heart: Regular rate and rhythm. No rubs, murmurs, or gallops. Abdomen: Soft, nontender, nondistended. No organomegaly noted, normoactive bowel  sounds. Musculoskeletal: 2-3+ left leg lymphedema.  Neuro: Alert, answering all questions appropriately. Cranial nerves grossly intact. Skin: No rashes or petechiae noted. Psych: Normal affect.   LAB RESULTS:  Lab Results  Component Value Date   NA 138 06/02/2017   K 2.5 (LL) 06/02/2017   CL 99 (L) 06/02/2017   CO2 26 06/02/2017   GLUCOSE 130 (H) 06/02/2017   BUN 8 06/02/2017   CREATININE 0.69 06/02/2017   CALCIUM 9.0 06/02/2017   PROT 7.1 06/02/2017   ALBUMIN 3.6 06/02/2017   AST 27 06/02/2017   ALT 11 (L) 06/02/2017   ALKPHOS 70 06/02/2017   BILITOT 0.5 06/02/2017   GFRNONAA >60 06/02/2017   GFRAA >60 06/02/2017    Lab Results  Component Value Date   WBC 4.9 06/02/2017   NEUTROABS 2.9 06/02/2017   HGB 10.4 (L) 06/02/2017   HCT 30.5 (L) 06/02/2017   MCV 94.4 06/02/2017   PLT 236 06/02/2017     STUDIES: Nm Pet Image Restag (ps) Skull Base To Thigh  Result Date: 05/26/2017 CLINICAL DATA:  Subsequent treatment strategy for endometrial carcinoma. Post chemotherapy. EXAM: NUCLEAR MEDICINE PET SKULL BASE TO THIGH TECHNIQUE: 6.39  mCi F-18 FDG was injected intravenously. Full-ring PET imaging was performed from the skull base to thigh after the radiotracer. CT data was obtained and used for attenuation correction and anatomic localization. Fasting blood glucose: 90 mg/dl Mediastinal blood pool activity: SUV max 2.3 COMPARISON:  CT 03/26/2017, 11/04/2016 FINDINGS: NECK: No hypermetabolic lymph nodes in the neck. Incidental CT findings: none CHEST: Small mediastinal lymph nodes have moderate metabolic activity. For example RIGHT lower paratracheal lymph node which is normal size and morphology with SUV max equal 2.7. Similar subcarinal lymph node is partially calcified SUV max 2.4. These nodes are just above background blood pool activity. No suspicious pulmonary nodules Incidental CT findings: none ABDOMEN/PELVIS: Within the RIGHT adnexa, complex cystic and solid mass measures 4.8 x  4.7 cm compared with 6.0 x 5 2 m per some decrease in volume. Along the lateral margin of this lesion there is focus of hypermetabolic activity with SUV max equal 7.1. Central portion of the lesion is low attenuation suggesting necrosis. No additional abnormal foci of radiotracer within the pelvis. No periaortic lymphadenopathy. No abnormal metabolic activity liver, pancreas or adrenal glands. Incidental CT findings: Infrarenal IVC filter noted SKELETON: No focal hypermetabolic activity to suggest skeletal metastasis. Incidental CT findings: none IMPRESSION: 1. Interval decrease in size cystic and solid mass in the LEFT adnexa. Hypermetabolic activity along the lateral margin of this lesion suggest a small focus of residual carcinoma. 2. No new or progressive malignancy in the pelvis. 3. No evidence of metastatic disease outside the pelvis. 4. Mild metabolic activity associated mediastinal lymph nodes is favored reactive. Electronically Signed   By: Suzy Bouchard M.D.   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  PLAN:    1. 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 Adjuvant treatment was recommended at that time, but patient refused on multiple occations and missed multiple follow-up appointments in the interim. She has now completed 6 cycles of carboplatinum and Taxol on April 21, 2017.  PET scan results from May 26, 2017 reviewed independently and reported as above with interval decrease in size of left adnexal lesion.  There is residual hypermetabolic activity possibly indicating residual disease therefore patient was given a referral to radiation oncology for local control.  No further chemotherapy is needed at this time.  Return to clinic in 6 weeks for repeat laboratory work and further evaluation.  Plan to reimage approximately 3 months after  completion of XRT if performed.   2. Pain: Improved.  Patient was given a prescription for oxycodone today.   3. Hypokalemia: Patient's potassium is significantly decreased today, continue oral supplementation. 4. Lymphedema: Stable, monitor. 5.  Anemia: Mild, monitor. 6.  Poor appetite/weight loss: Improved.  Consider dietary referral in the future.  Approximately 30 minutes was spent in discussion of which greater than 50% was consultation.  Patient expressed understanding and was in agreement with this plan. She also understands that She can call clinic at any time with any questions, concerns, or complaints.   Cancer Staging No matching staging information was found for the patient.   [name], MD   06/05/2017 8:29 AM

## 2017-06-02 ENCOUNTER — Inpatient Hospital Stay: Payer: Medicare Other | Attending: Oncology

## 2017-06-02 ENCOUNTER — Inpatient Hospital Stay (HOSPITAL_BASED_OUTPATIENT_CLINIC_OR_DEPARTMENT_OTHER): Payer: Medicare Other | Admitting: Oncology

## 2017-06-02 ENCOUNTER — Encounter: Payer: Self-pay | Admitting: Oncology

## 2017-06-02 VITALS — BP 147/85 | HR 75 | Temp 98.9°F | Resp 18 | Ht 61.0 in | Wt 131.4 lb

## 2017-06-02 DIAGNOSIS — R52 Pain, unspecified: Secondary | ICD-10-CM

## 2017-06-02 DIAGNOSIS — D649 Anemia, unspecified: Secondary | ICD-10-CM

## 2017-06-02 DIAGNOSIS — E876 Hypokalemia: Secondary | ICD-10-CM | POA: Insufficient documentation

## 2017-06-02 DIAGNOSIS — R1909 Other intra-abdominal and pelvic swelling, mass and lump: Secondary | ICD-10-CM

## 2017-06-02 DIAGNOSIS — R634 Abnormal weight loss: Secondary | ICD-10-CM | POA: Diagnosis not present

## 2017-06-02 DIAGNOSIS — C541 Malignant neoplasm of endometrium: Secondary | ICD-10-CM

## 2017-06-02 DIAGNOSIS — R630 Anorexia: Secondary | ICD-10-CM | POA: Diagnosis not present

## 2017-06-02 DIAGNOSIS — I890 Lymphedema, not elsewhere classified: Secondary | ICD-10-CM

## 2017-06-02 LAB — CBC WITH DIFFERENTIAL/PLATELET
BASOS ABS: 0 10*3/uL (ref 0–0.1)
Basophils Relative: 1 %
Eosinophils Absolute: 0 10*3/uL (ref 0–0.7)
Eosinophils Relative: 1 %
HCT: 30.5 % — ABNORMAL LOW (ref 35.0–47.0)
HEMOGLOBIN: 10.4 g/dL — AB (ref 12.0–16.0)
LYMPHS ABS: 1.7 10*3/uL (ref 1.0–3.6)
LYMPHS PCT: 34 %
MCH: 32.4 pg (ref 26.0–34.0)
MCHC: 34.3 g/dL (ref 32.0–36.0)
MCV: 94.4 fL (ref 80.0–100.0)
Monocytes Absolute: 0.3 10*3/uL (ref 0.2–0.9)
Monocytes Relative: 6 %
NEUTROS PCT: 58 %
Neutro Abs: 2.9 10*3/uL (ref 1.4–6.5)
PLATELETS: 236 10*3/uL (ref 150–440)
RBC: 3.23 MIL/uL — AB (ref 3.80–5.20)
RDW: 13.6 % (ref 11.5–14.5)
WBC: 4.9 10*3/uL (ref 3.6–11.0)

## 2017-06-02 LAB — COMPREHENSIVE METABOLIC PANEL
ALK PHOS: 70 U/L (ref 38–126)
ALT: 11 U/L — AB (ref 14–54)
AST: 27 U/L (ref 15–41)
Albumin: 3.6 g/dL (ref 3.5–5.0)
Anion gap: 13 (ref 5–15)
BUN: 8 mg/dL (ref 6–20)
CALCIUM: 9 mg/dL (ref 8.9–10.3)
CO2: 26 mmol/L (ref 22–32)
CREATININE: 0.69 mg/dL (ref 0.44–1.00)
Chloride: 99 mmol/L — ABNORMAL LOW (ref 101–111)
Glucose, Bld: 130 mg/dL — ABNORMAL HIGH (ref 65–99)
Potassium: 2.5 mmol/L — CL (ref 3.5–5.1)
SODIUM: 138 mmol/L (ref 135–145)
Total Bilirubin: 0.5 mg/dL (ref 0.3–1.2)
Total Protein: 7.1 g/dL (ref 6.5–8.1)

## 2017-06-02 NOTE — Progress Notes (Signed)
per family the patient has been vomiting for some time. But states she has only had 2 episodes.

## 2017-06-05 MED ORDER — OXYCODONE HCL 10 MG PO TABS: 10.0000 mg | ORAL_TABLET | ORAL | 0 refills | Status: DC | PRN

## 2017-06-07 ENCOUNTER — Other Ambulatory Visit: Payer: Self-pay | Admitting: *Deleted

## 2017-06-07 MED ORDER — OXYCODONE HCL 10 MG PO TABS: 10.0000 mg | ORAL_TABLET | ORAL | 0 refills | Status: DC | PRN

## 2017-06-07 NOTE — Telephone Encounter (Signed)
Pharmacy did not get Oxycodone prescription you refilled 3/16, it printed instead

## 2017-06-14 ENCOUNTER — Other Ambulatory Visit: Payer: Self-pay

## 2017-06-14 ENCOUNTER — Ambulatory Visit
Admission: RE | Admit: 2017-06-14 | Discharge: 2017-06-14 | Disposition: A | Discharge status: Home / Self Care | Payer: Medicare Other | Source: Ambulatory Visit | Attending: Radiation Oncology | Admitting: Radiation Oncology

## 2017-06-14 VITALS — BP 139/83 | HR 74 | Temp 98.6°F | Resp 12 | Wt 132.8 lb

## 2017-06-14 DIAGNOSIS — Z87891 Personal history of nicotine dependence: Secondary | ICD-10-CM | POA: Diagnosis not present

## 2017-06-14 DIAGNOSIS — Z90722 Acquired absence of ovaries, bilateral: Secondary | ICD-10-CM | POA: Insufficient documentation

## 2017-06-14 DIAGNOSIS — Z90710 Acquired absence of both cervix and uterus: Secondary | ICD-10-CM | POA: Insufficient documentation

## 2017-06-14 DIAGNOSIS — Z86711 Personal history of pulmonary embolism: Secondary | ICD-10-CM | POA: Diagnosis not present

## 2017-06-14 DIAGNOSIS — Z86718 Personal history of other venous thrombosis and embolism: Secondary | ICD-10-CM | POA: Diagnosis not present

## 2017-06-14 DIAGNOSIS — K5900 Constipation, unspecified: Secondary | ICD-10-CM | POA: Diagnosis not present

## 2017-06-14 DIAGNOSIS — N1330 Unspecified hydronephrosis: Secondary | ICD-10-CM | POA: Diagnosis not present

## 2017-06-14 DIAGNOSIS — I509 Heart failure, unspecified: Secondary | ICD-10-CM | POA: Insufficient documentation

## 2017-06-14 DIAGNOSIS — E785 Hyperlipidemia, unspecified: Secondary | ICD-10-CM | POA: Insufficient documentation

## 2017-06-14 DIAGNOSIS — M129 Arthropathy, unspecified: Secondary | ICD-10-CM | POA: Insufficient documentation

## 2017-06-14 DIAGNOSIS — C541 Malignant neoplasm of endometrium: Secondary | ICD-10-CM | POA: Diagnosis not present

## 2017-06-14 DIAGNOSIS — Z9221 Personal history of antineoplastic chemotherapy: Secondary | ICD-10-CM | POA: Diagnosis not present

## 2017-06-14 DIAGNOSIS — I10 Essential (primary) hypertension: Secondary | ICD-10-CM | POA: Insufficient documentation

## 2017-06-14 DIAGNOSIS — Z79899 Other long term (current) drug therapy: Secondary | ICD-10-CM | POA: Diagnosis not present

## 2017-06-14 DIAGNOSIS — Z923 Personal history of irradiation: Secondary | ICD-10-CM | POA: Insufficient documentation

## 2017-06-14 DIAGNOSIS — C7961 Secondary malignant neoplasm of right ovary: Secondary | ICD-10-CM | POA: Diagnosis not present

## 2017-06-14 DIAGNOSIS — E039 Hypothyroidism, unspecified: Secondary | ICD-10-CM | POA: Diagnosis not present

## 2017-06-14 DIAGNOSIS — C786 Secondary malignant neoplasm of retroperitoneum and peritoneum: Secondary | ICD-10-CM | POA: Diagnosis not present

## 2017-06-14 NOTE — Consult Note (Signed)
NEW PATIENT EVALUATION  Name: Vickie Barton  MRN: [id number]  Date:   06/14/2017     DOB: [birth year]   This 84 y.o. 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  REFERRING PHYSICIAN: [name], MD  CHIEF COMPLAINT:  Chief Complaint  Patient presents with  . endometrial residual    DIAGNOSIS: The encounter diagnosis was Endometrial cancer ([place]).   PREVIOUS INVESTIGATIONS:  PET/CT scans reviewed Pathology report reviewed Clinical notes reviewed  HPI: patient is a 84 year old female originally presented back in 2017 status post TAH/BSO for stage IIIa high-grade serous endometrial carcinoma.adjuvant treatment was recommended and patient refused both chemotherapy and radiation therapy evaluation. She recently presented with abdominal pain and discomfort PET CT scan demonstrated large pelvic mass with hypermetabolic activity concerning for recurrent endometrial carcinoma.initially on workup back inAugust 2018 showed bilateral pulmonary emboli as well as interval progression of abdominal pelvic metastatic adenopathy.she has been treated with she's had improved appearance in the adenopathy in the pelvis abdomen and chest withreduced mass effect and large left pelvic sidewall lymph node. She was treated with 6 cycles of carboplatinum and TaxolRepeat PET CT scan still shows hypermetabolic activity in the periphery of the large pelvic lesion consistent with viable tumor.she does have left hydronephrosis secondary to extrinsic compression for metastatic and mutual carcinoma.she is seen today and doing fairly well.I been asked to evaluate her for possible additional radiation therapy. She specifically denies vaginal bleedingany increased lower urinary tract symptoms or diarrhea at this time  PLANNED TREATMENT REGIMEN: whole pelvic radiation withbrachytherapy boost  PAST MEDICAL HISTORY:  has a past medical  history of Arthritis, CHF (congestive heart failure) ([place]), Constipation, DVT (deep venous thrombosis) ([place]) (03/2017), Endometrial cancer ([place]) (01/28/2016), Hyperlipidemia, Hypertension, and Hypothyroidism.    PAST SURGICAL HISTORY:  Past Surgical History:  Procedure Laterality Date  . CYSTOSCOPY W/ RETROGRADES Left 01/01/2017   Procedure: CYSTOSCOPY WITH RETROGRADE PYELOGRAM;  Surgeon: Nickie Retort, MD;  Location: [facility];  Service: Urology;  Laterality: Left;  . CYSTOSCOPY W/ RETROGRADES Left 04/23/2017   Procedure: CYSTOSCOPY WITH RETROGRADE PYELOGRAM;  Surgeon: Abbie Sons, MD;  Location: [facility];  Service: Urology;  Laterality: Left;  . CYSTOSCOPY W/ URETERAL STENT PLACEMENT Left 04/23/2017   Procedure: CYSTOSCOPY WITH STENT REMOVAL;  Surgeon: Abbie Sons, MD;  Location: [facility];  Service: Urology;  Laterality: Left;  . CYSTOSCOPY WITH STENT PLACEMENT Left 01/01/2017   Procedure: CYSTOSCOPY WITH STENT PLACEMENT;  Surgeon: Nickie Retort, MD;  Location: [facility];  Service: Urology;  Laterality: Left;  . DILATION AND CURETTAGE OF UTERUS    . HYSTEROSCOPY W/D&C N/A 01/28/2016   Procedure: DILATATION AND CURETTAGE /HYSTEROSCOPY;  Surgeon: [name], MD;  Location: [facility];  Service: Gynecology;  Laterality: N/A;  . LAPAROSCOPIC BILATERAL SALPINGO OOPHERECTOMY Bilateral 02/26/2016   Procedure: LAPAROSCOPIC BILATERAL SALPINGO OOPHORECTOMY;  Surgeon: [name], MD;  Location: [facility];  Service: Gynecology;  Laterality: Bilateral;  . LAPAROSCOPIC HYSTERECTOMY N/A 02/26/2016   Procedure: HYSTERECTOMY TOTAL LAPAROSCOPIC;  Surgeon: [name], MD;  Location: [facility];  Service: Gynecology;  Laterality: N/A;  . SENTINEL NODE BIOPSY N/A 02/26/2016   Procedure: SENTINEL NODE BIOPSY;  Surgeon: [name], MD;  Location: [facility];  Service: Gynecology;  Laterality: N/A;  . TONSILLECTOMY      FAMILY HISTORY: family history includes Diabetes in her father and  paternal grandmother; Heart disease in her father and mother; Hypertension  in her brother, father, mother, and sister; Stroke in her father and mother; Thyroid disease in her sister.  SOCIAL HISTORY:  reports that she has quit smoking. She has never used smokeless tobacco. She reports that she does not drink alcohol or use drugs.  ALLERGIES: Sulfa antibiotics and Zinc  MEDICATIONS:  Current Outpatient Medications  Medication Sig Dispense Refill  . ELIQUIS 5 MG TABS tablet Take 5 mg by mouth 2 (two) times daily.  0  . furosemide (LASIX) 20 MG tablet Take 20 mg by mouth 2 (two) times daily.  0  . levothyroxine (SYNTHROID, LEVOTHROID) 150 MCG tablet TAKE 150 MCG BY MOUTH DAILY BEFORE BREAKFAST ON EMPTY STOMACH WITH A GLASS OF WATER 30-60MINS BEFORE BREAKFAST  2  . lidocaine (LIDODERM) 5 % Place 1 patch onto the skin daily as needed (for pain). Remove & Discard patch within 12 hours or as directed by MD    . Oxycodone HCl 10 MG TABS Take 1 tablet (10 mg total) by mouth every 4 (four) hours as needed. 60 tablet 0   No current facility-administered medications for this encounter.     ECOG PERFORMANCE STATUS:  0 - Asymptomatic  REVIEW OF SYSTEMS:  Patient denies any weight loss, fatigue, weakness, fever, chills or night sweats. Patient denies any loss of vision, blurred vision. Patient denies any ringing  of the ears or hearing loss. No irregular heartbeat. Patient denies heart murmur or history of fainting. Patient denies any chest pain or pain radiating to her upper extremities. Patient denies any shortness of breath, difficulty breathing at night, cough or hemoptysis. Patient denies any swelling in the lower legs. Patient denies any nausea vomiting, vomiting of blood, or coffee ground material in the vomitus. Patient denies any stomach pain. Patient states has had normal bowel movements no significant constipation or diarrhea. Patient denies any dysuria, hematuria or significant nocturia. Patient  denies any problems walking, swelling in the joints or loss of balance. Patient denies any skin changes, loss of hair or loss of weight. Patient denies any excessive worrying or anxiety or significant depression. Patient denies any problems with insomnia. Patient denies excessive thirst, polyuria, polydipsia. Patient denies any swollen glands, patient denies easy bruising or easy bleeding. Patient denies any recent infections, allergies or URI. Patient "s visual fields have not changed significantly in recent time.    PHYSICAL EXAM: BP 139/83 (BP Location: Right Arm, Patient Position: Sitting, Cuff Size: Normal)   Pulse 74   Temp 98.6 F (37 C) (Tympanic)   Resp 12   Wt 132 lb 13.2 oz (60.2 kg)   BMI 25.10 kg/m  Well-developed well-nourished patient in NAD. HEENT reveals PERLA, EOMI, discs not visualized.  Oral cavity is clear. No oral mucosal lesions are identified. Neck is clear without evidence of cervical or supraclavicular adenopathy. Lungs are clear to A&P. Cardiac examination is essentially unremarkable with regular rate and rhythm without murmur rub or thrill. Abdomen is benign with no organomegaly or masses noted. Motor sensory and DTR levels are equal and symmetric in the upper and lower extremities. Cranial nerves II through XII are grossly intact. Proprioception is intact. No peripheral adenopathy or edema is identified. No motor or sensory levels are noted. Crude visual fields are within normal range.  LABORATORY DATA: pathology reports reviewed    RADIOLOGY RESULTS:serial CT scans and PET/CT scans reviewed and compatible above-stated findings   IMPRESSION: locally advanced high-grade serous endometrial carcinoma in 84 year old female status post original surgery in 2017 now with recurrent  or progressive disease in her pelvis.  PLAN: at this time I would like to go ahead with whole pelvic radiation therapy to 4500 cGy. I would also treat with vaginal brachytherapy boost after  completion of her whole pelvic radiation. Risks and benefits of whole pelvic radiation including increased lower urinary tract symptoms diarrhea fatigue alteration of blood counts skin reaction all were described in detail to the patient and her daughter. The large mass may need further radiation therapy down the line depending on PET/CT CT findings after completion of all treatment. We may be able to come back and target that with as it is the largest collection of tumor in her pelvis. I personally 7 ordered CT simulation for next week.  I would like to take this opportunity to thank you for allowing me to participate in the care of your patient.Noreene Filbert, MD

## 2017-06-17 LAB — SURGICAL PATHOLOGY

## 2017-06-21 ENCOUNTER — Ambulatory Visit
Admission: RE | Admit: 2017-06-21 | Discharge: 2017-06-21 | Disposition: A | Discharge status: Home / Self Care | Payer: Medicare Other | Source: Ambulatory Visit | Attending: Radiation Oncology | Admitting: Radiation Oncology

## 2017-06-21 DIAGNOSIS — C541 Malignant neoplasm of endometrium: Secondary | ICD-10-CM | POA: Insufficient documentation

## 2017-06-21 DIAGNOSIS — Z510 Encounter for antineoplastic radiation therapy: Secondary | ICD-10-CM | POA: Insufficient documentation

## 2017-06-21 DIAGNOSIS — C786 Secondary malignant neoplasm of retroperitoneum and peritoneum: Secondary | ICD-10-CM | POA: Diagnosis not present

## 2017-06-24 DIAGNOSIS — C541 Malignant neoplasm of endometrium: Secondary | ICD-10-CM | POA: Diagnosis not present

## 2017-06-24 DIAGNOSIS — C786 Secondary malignant neoplasm of retroperitoneum and peritoneum: Secondary | ICD-10-CM | POA: Diagnosis not present

## 2017-06-24 DIAGNOSIS — Z510 Encounter for antineoplastic radiation therapy: Secondary | ICD-10-CM | POA: Diagnosis not present

## 2017-06-25 ENCOUNTER — Other Ambulatory Visit: Payer: Self-pay | Admitting: *Deleted

## 2017-06-25 DIAGNOSIS — C541 Malignant neoplasm of endometrium: Secondary | ICD-10-CM

## 2017-06-30 ENCOUNTER — Ambulatory Visit
Admission: RE | Admit: 2017-06-30 | Discharge: 2017-06-30 | Disposition: A | Discharge status: Home / Self Care | Payer: Medicare Other | Source: Ambulatory Visit | Attending: Radiation Oncology | Admitting: Radiation Oncology

## 2017-07-01 ENCOUNTER — Ambulatory Visit
Admission: RE | Admit: 2017-07-01 | Discharge: 2017-07-01 | Disposition: A | Discharge status: Home / Self Care | Payer: Medicare Other | Source: Ambulatory Visit | Attending: Radiation Oncology | Admitting: Radiation Oncology

## 2017-07-01 DIAGNOSIS — C786 Secondary malignant neoplasm of retroperitoneum and peritoneum: Secondary | ICD-10-CM | POA: Diagnosis not present

## 2017-07-01 DIAGNOSIS — Z510 Encounter for antineoplastic radiation therapy: Secondary | ICD-10-CM | POA: Diagnosis not present

## 2017-07-01 DIAGNOSIS — C541 Malignant neoplasm of endometrium: Secondary | ICD-10-CM | POA: Diagnosis not present

## 2017-07-02 ENCOUNTER — Ambulatory Visit
Admission: RE | Admit: 2017-07-02 | Discharge: 2017-07-02 | Disposition: A | Discharge status: Home / Self Care | Payer: Medicare Other | Source: Ambulatory Visit | Attending: Radiation Oncology | Admitting: Radiation Oncology

## 2017-07-02 DIAGNOSIS — C541 Malignant neoplasm of endometrium: Secondary | ICD-10-CM | POA: Diagnosis not present

## 2017-07-02 DIAGNOSIS — Z510 Encounter for antineoplastic radiation therapy: Secondary | ICD-10-CM | POA: Diagnosis not present

## 2017-07-02 DIAGNOSIS — C786 Secondary malignant neoplasm of retroperitoneum and peritoneum: Secondary | ICD-10-CM | POA: Diagnosis not present

## 2017-07-05 ENCOUNTER — Ambulatory Visit
Admission: RE | Admit: 2017-07-05 | Discharge: 2017-07-05 | Disposition: A | Discharge status: Home / Self Care | Payer: Medicare Other | Source: Ambulatory Visit | Attending: Radiation Oncology | Admitting: Radiation Oncology

## 2017-07-05 DIAGNOSIS — C541 Malignant neoplasm of endometrium: Secondary | ICD-10-CM | POA: Diagnosis not present

## 2017-07-05 DIAGNOSIS — Z510 Encounter for antineoplastic radiation therapy: Secondary | ICD-10-CM | POA: Diagnosis not present

## 2017-07-05 DIAGNOSIS — C786 Secondary malignant neoplasm of retroperitoneum and peritoneum: Secondary | ICD-10-CM | POA: Diagnosis not present

## 2017-07-06 ENCOUNTER — Ambulatory Visit
Admission: RE | Admit: 2017-07-06 | Discharge: 2017-07-06 | Disposition: A | Discharge status: Home / Self Care | Payer: Medicare Other | Source: Ambulatory Visit | Attending: Radiation Oncology | Admitting: Radiation Oncology

## 2017-07-06 DIAGNOSIS — C541 Malignant neoplasm of endometrium: Secondary | ICD-10-CM | POA: Diagnosis not present

## 2017-07-06 DIAGNOSIS — Z510 Encounter for antineoplastic radiation therapy: Secondary | ICD-10-CM | POA: Diagnosis not present

## 2017-07-06 DIAGNOSIS — C786 Secondary malignant neoplasm of retroperitoneum and peritoneum: Secondary | ICD-10-CM | POA: Diagnosis not present

## 2017-07-07 ENCOUNTER — Ambulatory Visit
Admission: RE | Admit: 2017-07-07 | Discharge: 2017-07-07 | Disposition: A | Discharge status: Home / Self Care | Payer: Medicare Other | Source: Ambulatory Visit | Attending: Radiation Oncology | Admitting: Radiation Oncology

## 2017-07-07 DIAGNOSIS — C786 Secondary malignant neoplasm of retroperitoneum and peritoneum: Secondary | ICD-10-CM | POA: Diagnosis not present

## 2017-07-07 DIAGNOSIS — Z510 Encounter for antineoplastic radiation therapy: Secondary | ICD-10-CM | POA: Diagnosis not present

## 2017-07-07 DIAGNOSIS — C541 Malignant neoplasm of endometrium: Secondary | ICD-10-CM | POA: Diagnosis not present

## 2017-07-08 ENCOUNTER — Ambulatory Visit
Admission: RE | Admit: 2017-07-08 | Discharge: 2017-07-08 | Disposition: A | Discharge status: Home / Self Care | Payer: Medicare Other | Source: Ambulatory Visit | Attending: Radiation Oncology | Admitting: Radiation Oncology

## 2017-07-08 DIAGNOSIS — Z510 Encounter for antineoplastic radiation therapy: Secondary | ICD-10-CM | POA: Diagnosis not present

## 2017-07-08 DIAGNOSIS — C786 Secondary malignant neoplasm of retroperitoneum and peritoneum: Secondary | ICD-10-CM | POA: Diagnosis not present

## 2017-07-08 DIAGNOSIS — C541 Malignant neoplasm of endometrium: Secondary | ICD-10-CM | POA: Diagnosis not present

## 2017-07-09 ENCOUNTER — Ambulatory Visit
Admission: RE | Admit: 2017-07-09 | Discharge: 2017-07-09 | Disposition: A | Discharge status: Home / Self Care | Payer: Medicare Other | Source: Ambulatory Visit | Attending: Radiation Oncology | Admitting: Radiation Oncology

## 2017-07-09 DIAGNOSIS — Z510 Encounter for antineoplastic radiation therapy: Secondary | ICD-10-CM | POA: Diagnosis not present

## 2017-07-09 DIAGNOSIS — C541 Malignant neoplasm of endometrium: Secondary | ICD-10-CM | POA: Diagnosis not present

## 2017-07-09 DIAGNOSIS — C786 Secondary malignant neoplasm of retroperitoneum and peritoneum: Secondary | ICD-10-CM | POA: Diagnosis not present

## 2017-07-11 NOTE — Progress Notes (Signed)
[place]  [phone] [phone]  ID: Vickie Barton OB: May 16, 1930  MR#: [id number]  RJJ#:[id number]  Patient Care Team: [name], MD as PCP - General (Family Medicine)  CHIEF COMPLAINT: Recurrent and progressive stage IIIa high-grade serous endometrial carcinoma.  INTERVAL HISTORY: Patient returns to clinic today for routine follow-up.  She continues with XRT and is tolerating it well.  She has chronic weakness and fatigue.  Her left lower extremity edema has significantly improved.  She has no neurologic complaints. She denies any recent fevers or illnesses. She denies any chest pain or shortness of breath.  She denies any nausea, vomiting, constipation, or diarrhea.  She has no urinary complaints.  Patient offers no further specific complaints today.  REVIEW OF SYSTEMS:   Review of Systems  Constitutional: Positive for malaise/fatigue. Negative for fever and weight loss.  Respiratory: Negative.  Negative for cough and shortness of breath.   Cardiovascular: Positive for leg swelling. Negative for chest pain.  Gastrointestinal: Negative.  Negative for abdominal pain, constipation, nausea and vomiting.  Genitourinary: Negative.   Musculoskeletal: Negative.  Negative for joint pain.  Skin: Negative.  Negative for rash.  Neurological: Positive for weakness. Negative for sensory change and focal weakness.  Psychiatric/Behavioral: Negative.  The patient is not nervous/anxious.     As per HPI. Otherwise, a complete review of systems is negative.  PAST MEDICAL HISTORY: Past Medical History:  Diagnosis Date  . Arthritis   . CHF (congestive heart failure) ([place])   . Constipation   . DVT (deep venous thrombosis) ([place]) 03/2017   left leg  . Endometrial cancer ([place]) 01/28/2016  . Hyperlipidemia   . Hypertension   . Hypothyroidism     PAST SURGICAL HISTORY: Past Surgical History:  Procedure Laterality Date  . CYSTOSCOPY W/  RETROGRADES Left 01/01/2017   Procedure: CYSTOSCOPY WITH RETROGRADE PYELOGRAM;  Surgeon: Nickie Retort, MD;  Location: [facility];  Service: Urology;  Laterality: Left;  . CYSTOSCOPY W/ RETROGRADES Left 04/23/2017   Procedure: CYSTOSCOPY WITH RETROGRADE PYELOGRAM;  Surgeon: Abbie Sons, MD;  Location: [facility];  Service: Urology;  Laterality: Left;  . CYSTOSCOPY W/ URETERAL STENT PLACEMENT Left 04/23/2017   Procedure: CYSTOSCOPY WITH STENT REMOVAL;  Surgeon: Abbie Sons, MD;  Location: [facility];  Service: Urology;  Laterality: Left;  . CYSTOSCOPY WITH STENT PLACEMENT Left 01/01/2017   Procedure: CYSTOSCOPY WITH STENT PLACEMENT;  Surgeon: Nickie Retort, MD;  Location: [facility];  Service: Urology;  Laterality: Left;  . DILATION AND CURETTAGE OF UTERUS    . HYSTEROSCOPY W/D&C N/A 01/28/2016   Procedure: DILATATION AND CURETTAGE /HYSTEROSCOPY;  Surgeon: [name], MD;  Location: [facility];  Service: Gynecology;  Laterality: N/A;  . LAPAROSCOPIC BILATERAL SALPINGO OOPHERECTOMY Bilateral 02/26/2016   Procedure: LAPAROSCOPIC BILATERAL SALPINGO OOPHORECTOMY;  Surgeon: [name], MD;  Location: [facility];  Service: Gynecology;  Laterality: Bilateral;  . LAPAROSCOPIC HYSTERECTOMY N/A 02/26/2016   Procedure: HYSTERECTOMY TOTAL LAPAROSCOPIC;  Surgeon: [name], MD;  Location: [facility];  Service: Gynecology;  Laterality: N/A;  . SENTINEL NODE BIOPSY N/A 02/26/2016   Procedure: SENTINEL NODE BIOPSY;  Surgeon: [name], MD;  Location: [facility];  Service: Gynecology;  Laterality: N/A;  . TONSILLECTOMY      FAMILY HISTORY: Family History  Problem Relation Age of Onset  . Diabetes Father   . Heart disease Father   . Hypertension Father   . Stroke Father   . Diabetes Paternal Grandmother   .  Heart disease Mother   . Hypertension Mother   . Stroke Mother   . Hypertension Sister   . Thyroid disease Sister   . Hypertension Brother     ADVANCED DIRECTIVES (Y/N):   N  HEALTH MAINTENANCE: Social History   Tobacco Use  . Smoking status: Former Research scientist (life sciences)  . Smokeless tobacco: Never Used  Substance Use Topics  . Alcohol use: No  . Drug use: No     Colonoscopy:  PAP:  Bone density:  Lipid panel:  Allergies  Allergen Reactions  . Sulfa Antibiotics Rash  . Zinc Rash    Current Outpatient Medications  Medication Sig Dispense Refill  . ELIQUIS 5 MG TABS tablet Take 5 mg by mouth 2 (two) times daily.  0  . furosemide (LASIX) 20 MG tablet Take 20 mg by mouth 2 (two) times daily.  0  . levothyroxine (SYNTHROID, LEVOTHROID) 150 MCG tablet TAKE 150 MCG BY MOUTH DAILY BEFORE BREAKFAST ON EMPTY STOMACH WITH A GLASS OF WATER 30-60MINS BEFORE BREAKFAST  2  . Oxycodone HCl 10 MG TABS Take 1 tablet (10 mg total) by mouth every 4 (four) hours as needed. 60 tablet 0  . lidocaine (LIDODERM) 5 % Place 1 patch onto the skin daily as needed (for pain). Remove & Discard patch within 12 hours or as directed by MD     No current facility-administered medications for this visit.      OBJECTIVE: Vitals:   07/14/17 1151  BP: (!) 158/78  Pulse: 71  Resp: 18  Temp: 98.4 F (36.9 C)     Body mass index is 24.47 kg/m.    ECOG FS:1 - Symptomatic but completely ambulatory  General: Sitting in a wheelchair, no acute distress. Eyes: Pink conjunctiva, anicteric sclera. HEENT: Normocephalic, moist mucous membranes, clear oropharnyx. Lungs: Clear to auscultation bilaterally. Heart: Regular rate and rhythm. No rubs, murmurs, or gallops. Abdomen: Soft, nontender, nondistended. No organomegaly noted, normoactive bowel sounds. Musculoskeletal: Edema nearly resolved. Neuro: Alert, answering all questions appropriately. Cranial nerves grossly intact. Skin: No rashes or petechiae noted. Psych: Normal affect.  LAB RESULTS:  Lab Results  Component Value Date   NA 138 07/14/2017   K 3.1 (L) 07/14/2017   CL 101 07/14/2017   CO2 27 07/14/2017   GLUCOSE 122 (H)  07/14/2017   BUN 12 07/14/2017   CREATININE 0.68 07/14/2017   CALCIUM 9.3 07/14/2017   PROT 7.0 07/14/2017   ALBUMIN 3.7 07/14/2017   AST 21 07/14/2017   ALT 11 (L) 07/14/2017   ALKPHOS 59 07/14/2017   BILITOT 0.6 07/14/2017   GFRNONAA >60 07/14/2017   GFRAA >60 07/14/2017    Lab Results  Component Value Date   WBC 3.0 (L) 07/14/2017   NEUTROABS 1.9 07/14/2017   HGB 10.4 (L) 07/14/2017   HCT 30.1 (L) 07/14/2017   MCV 90.7 07/14/2017   PLT 200 07/14/2017     STUDIES: No results found.  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  PLAN:    1. 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 Adjuvant treatment was recommended at that time, but patient refused on multiple occations and missed multiple follow-up appointments in the interim.  After agreeing to adjuvant chemotherapy, patient finally completed 6 cycles of carboplatinum and Taxol on April 21, 2017.  PET scan results from May 26, 2017 reviewed independently with interval decrease in size of left adnexal lesion.  There is residual hypermetabolic activity possibly  indicating residual disease therefore patient was given a referral to radiation oncology for local control.  No further chemotherapy is needed at this time.  Continue daily XRT.  Return to clinic in 2 months with repeat laboratory work and further evaluation.  Will likely reimage approximately 3 months after the conclusion of XRT.   2. Pain: Improved.  Continue oxycodone as needed. 3. Hypokalemia: Patient's potassium is improved, but continued to be decreased.  Continue oral potassium supplementation. 4. Lymphedema: Significantly improved. 5.  Anemia: Patient's hemoglobin is decreased, but stable.  Monitor.  Approximately 30 minutes spent in discussion of which greater than 50% was consultation.  Patient expressed understanding and was in  agreement with this plan. She also understands that She can call clinic at any time with any questions, concerns, or complaints.   Cancer Staging No matching staging information was found for the patient.   [name], MD   07/16/2017 3:13 PM

## 2017-07-12 ENCOUNTER — Ambulatory Visit
Admission: RE | Admit: 2017-07-12 | Discharge: 2017-07-12 | Disposition: A | Discharge status: Home / Self Care | Payer: Medicare Other | Source: Ambulatory Visit | Attending: Radiation Oncology | Admitting: Radiation Oncology

## 2017-07-12 DIAGNOSIS — C786 Secondary malignant neoplasm of retroperitoneum and peritoneum: Secondary | ICD-10-CM | POA: Diagnosis not present

## 2017-07-12 DIAGNOSIS — C541 Malignant neoplasm of endometrium: Secondary | ICD-10-CM | POA: Diagnosis not present

## 2017-07-12 DIAGNOSIS — Z510 Encounter for antineoplastic radiation therapy: Secondary | ICD-10-CM | POA: Diagnosis not present

## 2017-07-13 ENCOUNTER — Ambulatory Visit
Admission: RE | Admit: 2017-07-13 | Discharge: 2017-07-13 | Disposition: A | Discharge status: Home / Self Care | Payer: Medicare Other | Source: Ambulatory Visit | Attending: Radiation Oncology | Admitting: Radiation Oncology

## 2017-07-13 DIAGNOSIS — C541 Malignant neoplasm of endometrium: Secondary | ICD-10-CM | POA: Diagnosis not present

## 2017-07-13 DIAGNOSIS — Z510 Encounter for antineoplastic radiation therapy: Secondary | ICD-10-CM | POA: Diagnosis not present

## 2017-07-13 DIAGNOSIS — C786 Secondary malignant neoplasm of retroperitoneum and peritoneum: Secondary | ICD-10-CM | POA: Diagnosis not present

## 2017-07-14 ENCOUNTER — Ambulatory Visit
Admission: RE | Admit: 2017-07-14 | Discharge: 2017-07-14 | Disposition: A | Discharge status: Home / Self Care | Payer: Medicare Other | Source: Ambulatory Visit | Attending: Radiation Oncology | Admitting: Radiation Oncology

## 2017-07-14 ENCOUNTER — Other Ambulatory Visit: Payer: Self-pay

## 2017-07-14 ENCOUNTER — Inpatient Hospital Stay: Payer: Medicare Other | Attending: Oncology

## 2017-07-14 ENCOUNTER — Inpatient Hospital Stay (HOSPITAL_BASED_OUTPATIENT_CLINIC_OR_DEPARTMENT_OTHER): Payer: Medicare Other | Admitting: Oncology

## 2017-07-14 VITALS — BP 158/78 | HR 71 | Temp 98.4°F | Resp 18 | Wt 129.5 lb

## 2017-07-14 DIAGNOSIS — R52 Pain, unspecified: Secondary | ICD-10-CM | POA: Insufficient documentation

## 2017-07-14 DIAGNOSIS — C541 Malignant neoplasm of endometrium: Secondary | ICD-10-CM | POA: Insufficient documentation

## 2017-07-14 DIAGNOSIS — E876 Hypokalemia: Secondary | ICD-10-CM | POA: Insufficient documentation

## 2017-07-14 DIAGNOSIS — I890 Lymphedema, not elsewhere classified: Secondary | ICD-10-CM

## 2017-07-14 DIAGNOSIS — R1909 Other intra-abdominal and pelvic swelling, mass and lump: Secondary | ICD-10-CM | POA: Diagnosis not present

## 2017-07-14 DIAGNOSIS — D649 Anemia, unspecified: Secondary | ICD-10-CM | POA: Diagnosis not present

## 2017-07-14 DIAGNOSIS — C786 Secondary malignant neoplasm of retroperitoneum and peritoneum: Secondary | ICD-10-CM | POA: Diagnosis not present

## 2017-07-14 DIAGNOSIS — Z87891 Personal history of nicotine dependence: Secondary | ICD-10-CM | POA: Insufficient documentation

## 2017-07-14 DIAGNOSIS — Z510 Encounter for antineoplastic radiation therapy: Secondary | ICD-10-CM | POA: Diagnosis not present

## 2017-07-14 LAB — COMPREHENSIVE METABOLIC PANEL
ALT: 11 U/L — ABNORMAL LOW (ref 14–54)
ANION GAP: 10 (ref 5–15)
AST: 21 U/L (ref 15–41)
Albumin: 3.7 g/dL (ref 3.5–5.0)
Alkaline Phosphatase: 59 U/L (ref 38–126)
BUN: 12 mg/dL (ref 6–20)
CHLORIDE: 101 mmol/L (ref 101–111)
CO2: 27 mmol/L (ref 22–32)
Calcium: 9.3 mg/dL (ref 8.9–10.3)
Creatinine, Ser: 0.68 mg/dL (ref 0.44–1.00)
GFR calc Af Amer: >60 mL/min (ref >60)
Glucose, Bld: 122 mg/dL — ABNORMAL HIGH (ref 65–99)
POTASSIUM: 3.1 mmol/L — AB (ref 3.5–5.1)
Sodium: 138 mmol/L (ref 135–145)
Total Bilirubin: 0.6 mg/dL (ref 0.3–1.2)
Total Protein: 7 g/dL (ref 6.5–8.1)

## 2017-07-14 LAB — CBC WITH DIFFERENTIAL/PLATELET
Basophils Absolute: 0 10*3/uL (ref 0–0.1)
Basophils Relative: 1 %
EOS ABS: 0.1 10*3/uL (ref 0–0.7)
Eosinophils Relative: 3 %
HCT: 30.1 % — ABNORMAL LOW (ref 35.0–47.0)
HEMOGLOBIN: 10.4 g/dL — AB (ref 12.0–16.0)
LYMPHS ABS: 0.7 10*3/uL — AB (ref 1.0–3.6)
LYMPHS PCT: 25 %
MCH: 31.3 pg (ref 26.0–34.0)
MCHC: 34.5 g/dL (ref 32.0–36.0)
MCV: 90.7 fL (ref 80.0–100.0)
Monocytes Absolute: 0.2 10*3/uL (ref 0.2–0.9)
Monocytes Relative: 7 %
NEUTROS ABS: 1.9 10*3/uL (ref 1.4–6.5)
NEUTROS PCT: 64 %
Platelets: 200 10*3/uL (ref 150–440)
RBC: 3.32 MIL/uL — ABNORMAL LOW (ref 3.80–5.20)
RDW: 13.4 % (ref 11.5–14.5)
WBC: 3 10*3/uL — AB (ref 3.6–11.0)

## 2017-07-14 MED ORDER — OXYCODONE HCL 10 MG PO TABS: 10.0000 mg | ORAL_TABLET | ORAL | 0 refills | Status: DC | PRN

## 2017-07-14 NOTE — Progress Notes (Signed)
Here for for follow up.per pt"im doing good"

## 2017-07-15 ENCOUNTER — Ambulatory Visit
Admission: RE | Admit: 2017-07-15 | Discharge: 2017-07-15 | Disposition: A | Discharge status: Home / Self Care | Payer: Medicare Other | Source: Ambulatory Visit | Attending: Radiation Oncology | Admitting: Radiation Oncology

## 2017-07-15 DIAGNOSIS — Z510 Encounter for antineoplastic radiation therapy: Secondary | ICD-10-CM | POA: Diagnosis not present

## 2017-07-15 DIAGNOSIS — C541 Malignant neoplasm of endometrium: Secondary | ICD-10-CM | POA: Diagnosis not present

## 2017-07-15 DIAGNOSIS — C786 Secondary malignant neoplasm of retroperitoneum and peritoneum: Secondary | ICD-10-CM | POA: Diagnosis not present

## 2017-07-16 ENCOUNTER — Ambulatory Visit
Admission: RE | Admit: 2017-07-16 | Discharge: 2017-07-16 | Disposition: A | Discharge status: Home / Self Care | Payer: Medicare Other | Source: Ambulatory Visit | Attending: Radiation Oncology | Admitting: Radiation Oncology

## 2017-07-16 DIAGNOSIS — C541 Malignant neoplasm of endometrium: Secondary | ICD-10-CM | POA: Diagnosis not present

## 2017-07-16 DIAGNOSIS — Z510 Encounter for antineoplastic radiation therapy: Secondary | ICD-10-CM | POA: Diagnosis not present

## 2017-07-16 DIAGNOSIS — C786 Secondary malignant neoplasm of retroperitoneum and peritoneum: Secondary | ICD-10-CM | POA: Diagnosis not present

## 2017-07-19 ENCOUNTER — Ambulatory Visit
Admission: RE | Admit: 2017-07-19 | Discharge: 2017-07-19 | Disposition: A | Discharge status: Home / Self Care | Payer: Medicare Other | Source: Ambulatory Visit | Attending: Radiation Oncology | Admitting: Radiation Oncology

## 2017-07-19 DIAGNOSIS — Z510 Encounter for antineoplastic radiation therapy: Secondary | ICD-10-CM | POA: Diagnosis not present

## 2017-07-19 DIAGNOSIS — C786 Secondary malignant neoplasm of retroperitoneum and peritoneum: Secondary | ICD-10-CM | POA: Diagnosis not present

## 2017-07-19 DIAGNOSIS — C541 Malignant neoplasm of endometrium: Secondary | ICD-10-CM | POA: Diagnosis not present

## 2017-07-20 ENCOUNTER — Ambulatory Visit
Admission: RE | Admit: 2017-07-20 | Discharge: 2017-07-20 | Disposition: A | Discharge status: Home / Self Care | Payer: Medicare Other | Source: Ambulatory Visit | Attending: Radiation Oncology | Admitting: Radiation Oncology

## 2017-07-20 DIAGNOSIS — C786 Secondary malignant neoplasm of retroperitoneum and peritoneum: Secondary | ICD-10-CM | POA: Diagnosis not present

## 2017-07-20 DIAGNOSIS — Z510 Encounter for antineoplastic radiation therapy: Secondary | ICD-10-CM | POA: Diagnosis not present

## 2017-07-20 DIAGNOSIS — C541 Malignant neoplasm of endometrium: Secondary | ICD-10-CM | POA: Diagnosis not present

## 2017-07-21 ENCOUNTER — Inpatient Hospital Stay: Payer: Medicare Other | Attending: Radiation Oncology

## 2017-07-21 ENCOUNTER — Ambulatory Visit
Admission: RE | Admit: 2017-07-21 | Discharge: 2017-07-21 | Disposition: A | Discharge status: Home / Self Care | Payer: Medicare Other | Source: Ambulatory Visit | Attending: Radiation Oncology | Admitting: Radiation Oncology

## 2017-07-21 DIAGNOSIS — C541 Malignant neoplasm of endometrium: Secondary | ICD-10-CM | POA: Diagnosis not present

## 2017-07-21 DIAGNOSIS — Z510 Encounter for antineoplastic radiation therapy: Secondary | ICD-10-CM | POA: Insufficient documentation

## 2017-07-21 DIAGNOSIS — C786 Secondary malignant neoplasm of retroperitoneum and peritoneum: Secondary | ICD-10-CM | POA: Diagnosis not present

## 2017-07-21 LAB — CBC
HEMATOCRIT: 28.3 % — AB (ref 35.0–47.0)
Hemoglobin: 10 g/dL — ABNORMAL LOW (ref 12.0–16.0)
MCH: 31.6 pg (ref 26.0–34.0)
MCHC: 35.4 g/dL (ref 32.0–36.0)
MCV: 89.2 fL (ref 80.0–100.0)
PLATELETS: 206 10*3/uL (ref 150–440)
RBC: 3.17 MIL/uL — ABNORMAL LOW (ref 3.80–5.20)
RDW: 13.6 % (ref 11.5–14.5)
WBC: 3.4 10*3/uL — ABNORMAL LOW (ref 3.6–11.0)

## 2017-07-22 ENCOUNTER — Ambulatory Visit
Admission: RE | Admit: 2017-07-22 | Discharge: 2017-07-22 | Disposition: A | Discharge status: Home / Self Care | Payer: Medicare Other | Source: Ambulatory Visit | Attending: Radiation Oncology | Admitting: Radiation Oncology

## 2017-07-22 DIAGNOSIS — Z510 Encounter for antineoplastic radiation therapy: Secondary | ICD-10-CM | POA: Diagnosis not present

## 2017-07-22 DIAGNOSIS — C786 Secondary malignant neoplasm of retroperitoneum and peritoneum: Secondary | ICD-10-CM | POA: Diagnosis not present

## 2017-07-22 DIAGNOSIS — C541 Malignant neoplasm of endometrium: Secondary | ICD-10-CM | POA: Diagnosis not present

## 2017-07-23 ENCOUNTER — Ambulatory Visit
Admission: RE | Admit: 2017-07-23 | Discharge: 2017-07-23 | Disposition: A | Discharge status: Home / Self Care | Payer: Medicare Other | Source: Ambulatory Visit | Attending: Radiation Oncology | Admitting: Radiation Oncology

## 2017-07-23 DIAGNOSIS — C786 Secondary malignant neoplasm of retroperitoneum and peritoneum: Secondary | ICD-10-CM | POA: Diagnosis not present

## 2017-07-23 DIAGNOSIS — Z510 Encounter for antineoplastic radiation therapy: Secondary | ICD-10-CM | POA: Diagnosis not present

## 2017-07-23 DIAGNOSIS — C541 Malignant neoplasm of endometrium: Secondary | ICD-10-CM | POA: Diagnosis not present

## 2017-07-26 ENCOUNTER — Ambulatory Visit
Admission: RE | Admit: 2017-07-26 | Discharge: 2017-07-26 | Disposition: A | Discharge status: Home / Self Care | Payer: Medicare Other | Source: Ambulatory Visit | Attending: Radiation Oncology | Admitting: Radiation Oncology

## 2017-07-26 DIAGNOSIS — C786 Secondary malignant neoplasm of retroperitoneum and peritoneum: Secondary | ICD-10-CM | POA: Diagnosis not present

## 2017-07-26 DIAGNOSIS — C541 Malignant neoplasm of endometrium: Secondary | ICD-10-CM | POA: Diagnosis not present

## 2017-07-26 DIAGNOSIS — Z510 Encounter for antineoplastic radiation therapy: Secondary | ICD-10-CM | POA: Diagnosis not present

## 2017-07-27 ENCOUNTER — Ambulatory Visit
Admission: RE | Admit: 2017-07-27 | Discharge: 2017-07-27 | Disposition: A | Discharge status: Home / Self Care | Payer: Medicare Other | Source: Ambulatory Visit | Attending: Radiation Oncology | Admitting: Radiation Oncology

## 2017-07-27 DIAGNOSIS — C541 Malignant neoplasm of endometrium: Secondary | ICD-10-CM | POA: Diagnosis not present

## 2017-07-27 DIAGNOSIS — C786 Secondary malignant neoplasm of retroperitoneum and peritoneum: Secondary | ICD-10-CM | POA: Diagnosis not present

## 2017-07-27 DIAGNOSIS — Z510 Encounter for antineoplastic radiation therapy: Secondary | ICD-10-CM | POA: Diagnosis not present

## 2017-07-28 ENCOUNTER — Inpatient Hospital Stay: Payer: Medicare Other

## 2017-07-28 ENCOUNTER — Ambulatory Visit
Admission: RE | Admit: 2017-07-28 | Discharge: 2017-07-28 | Disposition: A | Discharge status: Home / Self Care | Payer: Medicare Other | Source: Ambulatory Visit | Attending: Radiation Oncology | Admitting: Radiation Oncology

## 2017-07-28 DIAGNOSIS — C541 Malignant neoplasm of endometrium: Secondary | ICD-10-CM | POA: Diagnosis not present

## 2017-07-28 DIAGNOSIS — Z510 Encounter for antineoplastic radiation therapy: Secondary | ICD-10-CM | POA: Diagnosis not present

## 2017-07-28 DIAGNOSIS — C786 Secondary malignant neoplasm of retroperitoneum and peritoneum: Secondary | ICD-10-CM | POA: Diagnosis not present

## 2017-07-28 LAB — CBC WITH DIFFERENTIAL/PLATELET
BASOS ABS: 0 10*3/uL (ref 0–0.1)
BASOS PCT: 0 %
Eosinophils Absolute: 0.1 10*3/uL (ref 0–0.7)
Eosinophils Relative: 3 %
HEMATOCRIT: 28.4 % — AB (ref 35.0–47.0)
HEMOGLOBIN: 10.1 g/dL — AB (ref 12.0–16.0)
Lymphocytes Relative: 18 %
Lymphs Abs: 0.6 10*3/uL — ABNORMAL LOW (ref 1.0–3.6)
MCH: 31.5 pg (ref 26.0–34.0)
MCHC: 35.6 g/dL (ref 32.0–36.0)
MCV: 88.4 fL (ref 80.0–100.0)
MONO ABS: 0.4 10*3/uL (ref 0.2–0.9)
Monocytes Relative: 10 %
NEUTROS ABS: 2.5 10*3/uL (ref 1.4–6.5)
NEUTROS PCT: 69 %
PLATELETS: 246 10*3/uL (ref 150–440)
RBC: 3.21 MIL/uL — AB (ref 3.80–5.20)
RDW: 14.1 % (ref 11.5–14.5)
WBC: 3.6 10*3/uL (ref 3.6–11.0)

## 2017-07-28 LAB — COMPREHENSIVE METABOLIC PANEL
ALK PHOS: 58 U/L (ref 38–126)
ALT: 10 U/L — ABNORMAL LOW (ref 14–54)
AST: 20 U/L (ref 15–41)
Albumin: 3.6 g/dL (ref 3.5–5.0)
Anion gap: 11 (ref 5–15)
BILIRUBIN TOTAL: 0.6 mg/dL (ref 0.3–1.2)
BUN: 13 mg/dL (ref 6–20)
CALCIUM: 8.7 mg/dL — AB (ref 8.9–10.3)
CO2: 27 mmol/L (ref 22–32)
Chloride: 100 mmol/L — ABNORMAL LOW (ref 101–111)
Creatinine, Ser: 0.63 mg/dL (ref 0.44–1.00)
Glucose, Bld: 112 mg/dL — ABNORMAL HIGH (ref 65–99)
POTASSIUM: 2.9 mmol/L — AB (ref 3.5–5.1)
Sodium: 138 mmol/L (ref 135–145)
TOTAL PROTEIN: 7 g/dL (ref 6.5–8.1)

## 2017-07-29 ENCOUNTER — Ambulatory Visit
Admission: RE | Admit: 2017-07-29 | Discharge: 2017-07-29 | Disposition: A | Discharge status: Home / Self Care | Payer: Medicare Other | Source: Ambulatory Visit | Attending: Radiation Oncology | Admitting: Radiation Oncology

## 2017-07-29 DIAGNOSIS — C786 Secondary malignant neoplasm of retroperitoneum and peritoneum: Secondary | ICD-10-CM | POA: Diagnosis not present

## 2017-07-29 DIAGNOSIS — Z510 Encounter for antineoplastic radiation therapy: Secondary | ICD-10-CM | POA: Diagnosis not present

## 2017-07-29 DIAGNOSIS — C541 Malignant neoplasm of endometrium: Secondary | ICD-10-CM | POA: Diagnosis not present

## 2017-07-30 ENCOUNTER — Ambulatory Visit
Admission: RE | Admit: 2017-07-30 | Discharge: 2017-07-30 | Disposition: A | Discharge status: Home / Self Care | Payer: Medicare Other | Source: Ambulatory Visit | Attending: Radiation Oncology | Admitting: Radiation Oncology

## 2017-07-30 DIAGNOSIS — C541 Malignant neoplasm of endometrium: Secondary | ICD-10-CM | POA: Diagnosis not present

## 2017-07-30 DIAGNOSIS — C786 Secondary malignant neoplasm of retroperitoneum and peritoneum: Secondary | ICD-10-CM | POA: Diagnosis not present

## 2017-07-30 DIAGNOSIS — Z510 Encounter for antineoplastic radiation therapy: Secondary | ICD-10-CM | POA: Diagnosis not present

## 2017-08-02 ENCOUNTER — Ambulatory Visit
Admission: RE | Admit: 2017-08-02 | Discharge: 2017-08-02 | Disposition: A | Discharge status: Home / Self Care | Payer: Medicare Other | Source: Ambulatory Visit | Attending: Radiation Oncology | Admitting: Radiation Oncology

## 2017-08-02 DIAGNOSIS — C786 Secondary malignant neoplasm of retroperitoneum and peritoneum: Secondary | ICD-10-CM | POA: Diagnosis not present

## 2017-08-02 DIAGNOSIS — Z510 Encounter for antineoplastic radiation therapy: Secondary | ICD-10-CM | POA: Diagnosis not present

## 2017-08-02 DIAGNOSIS — C541 Malignant neoplasm of endometrium: Secondary | ICD-10-CM | POA: Diagnosis not present

## 2017-08-03 ENCOUNTER — Ambulatory Visit
Admission: RE | Admit: 2017-08-03 | Discharge: 2017-08-03 | Disposition: A | Discharge status: Home / Self Care | Payer: Medicare Other | Source: Ambulatory Visit | Attending: Radiation Oncology | Admitting: Radiation Oncology

## 2017-08-03 DIAGNOSIS — Z510 Encounter for antineoplastic radiation therapy: Secondary | ICD-10-CM | POA: Diagnosis not present

## 2017-08-03 DIAGNOSIS — C541 Malignant neoplasm of endometrium: Secondary | ICD-10-CM | POA: Diagnosis not present

## 2017-08-03 DIAGNOSIS — C786 Secondary malignant neoplasm of retroperitoneum and peritoneum: Secondary | ICD-10-CM | POA: Diagnosis not present

## 2017-08-04 ENCOUNTER — Ambulatory Visit
Admission: RE | Admit: 2017-08-04 | Discharge: 2017-08-04 | Disposition: A | Discharge status: Home / Self Care | Payer: Medicare Other | Source: Ambulatory Visit | Attending: Radiation Oncology | Admitting: Radiation Oncology

## 2017-08-04 DIAGNOSIS — C541 Malignant neoplasm of endometrium: Secondary | ICD-10-CM | POA: Diagnosis not present

## 2017-08-04 DIAGNOSIS — C786 Secondary malignant neoplasm of retroperitoneum and peritoneum: Secondary | ICD-10-CM | POA: Diagnosis not present

## 2017-08-04 DIAGNOSIS — Z510 Encounter for antineoplastic radiation therapy: Secondary | ICD-10-CM | POA: Diagnosis not present

## 2017-08-11 ENCOUNTER — Ambulatory Visit
Admission: RE | Admit: 2017-08-11 | Discharge: 2017-08-11 | Disposition: A | Discharge status: Home / Self Care | Payer: Medicare Other | Source: Ambulatory Visit | Attending: Radiation Oncology | Admitting: Radiation Oncology

## 2017-08-11 DIAGNOSIS — C541 Malignant neoplasm of endometrium: Secondary | ICD-10-CM | POA: Diagnosis not present

## 2017-08-11 DIAGNOSIS — Z510 Encounter for antineoplastic radiation therapy: Secondary | ICD-10-CM | POA: Diagnosis not present

## 2017-08-11 DIAGNOSIS — C786 Secondary malignant neoplasm of retroperitoneum and peritoneum: Secondary | ICD-10-CM | POA: Diagnosis not present

## 2017-08-12 ENCOUNTER — Ambulatory Visit: Payer: Medicare Other

## 2017-08-12 DIAGNOSIS — C541 Malignant neoplasm of endometrium: Secondary | ICD-10-CM | POA: Diagnosis not present

## 2017-08-12 DIAGNOSIS — Z510 Encounter for antineoplastic radiation therapy: Secondary | ICD-10-CM | POA: Diagnosis not present

## 2017-08-12 DIAGNOSIS — C786 Secondary malignant neoplasm of retroperitoneum and peritoneum: Secondary | ICD-10-CM | POA: Diagnosis not present

## 2017-08-23 ENCOUNTER — Ambulatory Visit
Admission: RE | Admit: 2017-08-23 | Discharge: 2017-08-23 | Disposition: A | Discharge status: Home / Self Care | Payer: Medicare Other | Source: Ambulatory Visit | Attending: Radiation Oncology | Admitting: Radiation Oncology

## 2017-08-23 DIAGNOSIS — Z7901 Long term (current) use of anticoagulants: Secondary | ICD-10-CM | POA: Diagnosis not present

## 2017-08-23 DIAGNOSIS — C541 Malignant neoplasm of endometrium: Secondary | ICD-10-CM | POA: Diagnosis not present

## 2017-08-23 DIAGNOSIS — I509 Heart failure, unspecified: Secondary | ICD-10-CM | POA: Diagnosis not present

## 2017-08-23 DIAGNOSIS — Z8489 Family history of other specified conditions: Secondary | ICD-10-CM | POA: Insufficient documentation

## 2017-08-23 DIAGNOSIS — Z79890 Hormone replacement therapy (postmenopausal): Secondary | ICD-10-CM | POA: Insufficient documentation

## 2017-08-23 DIAGNOSIS — E785 Hyperlipidemia, unspecified: Secondary | ICD-10-CM | POA: Diagnosis not present

## 2017-08-23 DIAGNOSIS — Z8249 Family history of ischemic heart disease and other diseases of the circulatory system: Secondary | ICD-10-CM | POA: Diagnosis not present

## 2017-08-23 DIAGNOSIS — Z510 Encounter for antineoplastic radiation therapy: Secondary | ICD-10-CM | POA: Insufficient documentation

## 2017-08-23 DIAGNOSIS — C786 Secondary malignant neoplasm of retroperitoneum and peritoneum: Secondary | ICD-10-CM | POA: Diagnosis not present

## 2017-08-23 DIAGNOSIS — I110 Hypertensive heart disease with heart failure: Secondary | ICD-10-CM | POA: Insufficient documentation

## 2017-08-23 DIAGNOSIS — Z79899 Other long term (current) drug therapy: Secondary | ICD-10-CM | POA: Diagnosis not present

## 2017-08-23 DIAGNOSIS — Z87891 Personal history of nicotine dependence: Secondary | ICD-10-CM | POA: Diagnosis not present

## 2017-08-23 DIAGNOSIS — Z833 Family history of diabetes mellitus: Secondary | ICD-10-CM | POA: Insufficient documentation

## 2017-08-23 DIAGNOSIS — E039 Hypothyroidism, unspecified: Secondary | ICD-10-CM | POA: Insufficient documentation

## 2017-08-23 DIAGNOSIS — Z823 Family history of stroke: Secondary | ICD-10-CM | POA: Diagnosis not present

## 2017-08-24 ENCOUNTER — Ambulatory Visit: Payer: Medicare Other

## 2017-08-25 ENCOUNTER — Ambulatory Visit: Payer: Medicare Other

## 2017-08-26 ENCOUNTER — Ambulatory Visit
Admission: RE | Admit: 2017-08-26 | Discharge: 2017-08-26 | Disposition: A | Discharge status: Home / Self Care | Payer: Medicare Other | Source: Ambulatory Visit | Attending: Radiation Oncology | Admitting: Radiation Oncology

## 2017-08-26 DIAGNOSIS — E039 Hypothyroidism, unspecified: Secondary | ICD-10-CM | POA: Diagnosis not present

## 2017-08-26 DIAGNOSIS — Z510 Encounter for antineoplastic radiation therapy: Secondary | ICD-10-CM | POA: Diagnosis not present

## 2017-08-26 DIAGNOSIS — C541 Malignant neoplasm of endometrium: Secondary | ICD-10-CM | POA: Diagnosis not present

## 2017-08-26 DIAGNOSIS — I509 Heart failure, unspecified: Secondary | ICD-10-CM | POA: Diagnosis not present

## 2017-08-26 DIAGNOSIS — C786 Secondary malignant neoplasm of retroperitoneum and peritoneum: Secondary | ICD-10-CM | POA: Diagnosis not present

## 2017-08-26 DIAGNOSIS — I110 Hypertensive heart disease with heart failure: Secondary | ICD-10-CM | POA: Diagnosis not present

## 2017-08-26 DIAGNOSIS — E785 Hyperlipidemia, unspecified: Secondary | ICD-10-CM | POA: Diagnosis not present

## 2017-08-30 ENCOUNTER — Ambulatory Visit: Payer: Medicare Other

## 2017-08-31 ENCOUNTER — Ambulatory Visit: Payer: Medicare Other

## 2017-09-01 ENCOUNTER — Ambulatory Visit: Payer: Medicare Other

## 2017-09-02 ENCOUNTER — Ambulatory Visit
Admission: RE | Admit: 2017-09-02 | Discharge: 2017-09-02 | Disposition: A | Discharge status: Home / Self Care | Payer: Medicare Other | Source: Ambulatory Visit | Attending: Radiation Oncology | Admitting: Radiation Oncology

## 2017-09-02 DIAGNOSIS — I509 Heart failure, unspecified: Secondary | ICD-10-CM | POA: Diagnosis not present

## 2017-09-02 DIAGNOSIS — C541 Malignant neoplasm of endometrium: Secondary | ICD-10-CM | POA: Diagnosis not present

## 2017-09-02 DIAGNOSIS — C786 Secondary malignant neoplasm of retroperitoneum and peritoneum: Secondary | ICD-10-CM | POA: Diagnosis not present

## 2017-09-02 DIAGNOSIS — E785 Hyperlipidemia, unspecified: Secondary | ICD-10-CM | POA: Diagnosis not present

## 2017-09-02 DIAGNOSIS — E039 Hypothyroidism, unspecified: Secondary | ICD-10-CM | POA: Diagnosis not present

## 2017-09-02 DIAGNOSIS — I110 Hypertensive heart disease with heart failure: Secondary | ICD-10-CM | POA: Diagnosis not present

## 2017-09-02 DIAGNOSIS — Z510 Encounter for antineoplastic radiation therapy: Secondary | ICD-10-CM | POA: Diagnosis not present

## 2017-09-09 ENCOUNTER — Other Ambulatory Visit: Payer: Medicare Other

## 2017-09-09 ENCOUNTER — Ambulatory Visit: Payer: Medicare Other | Admitting: Oncology

## 2017-09-13 NOTE — Progress Notes (Signed)
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  INTERVAL HISTORY: Patient returns to clinic today for routine follow-up at the conclusion of her XRT.  She is noticing worsening lymphedema in her left leg, but otherwise feels well.  She continues to have chronic weakness and fatigue that is unchanged.  She has no neurologic complaints. She denies any recent fevers or illnesses. She denies any chest pain or shortness of breath.  She denies any nausea, vomiting, constipation, or diarrhea.  She has no urinary complaints.  Patient offers no further specific complaints today.  REVIEW OF SYSTEMS:   Review of Systems  Constitutional: Positive for malaise/fatigue. Negative for fever and weight loss.  Respiratory: Negative.  Negative for cough and shortness of breath.   Cardiovascular: Positive for leg swelling. Negative for chest pain.  Gastrointestinal: Negative.  Negative for abdominal pain, constipation, nausea and vomiting.  Genitourinary: Negative.  Negative for dysuria.  Musculoskeletal: Negative.  Negative for joint pain.  Skin: Negative.  Negative for rash.  Neurological: Positive for weakness. Negative for sensory change and focal weakness.  Psychiatric/Behavioral: Negative.  The patient is not nervous/anxious.     As per HPI. Otherwise, a complete review of systems is negative.  PAST MEDICAL HISTORY: Past Medical History:  Diagnosis Date  . Arthritis   . CHF (congestive heart failure) ([place])   . Constipation   . DVT (deep venous thrombosis) ([place]) 03/2017   left leg  . Endometrial cancer ([place]) 01/28/2016  . Hyperlipidemia   . Hypertension   . Hypothyroidism     PAST SURGICAL HISTORY: Past Surgical History:    Procedure Laterality Date  . CYSTOSCOPY W/ RETROGRADES Left 01/01/2017   Procedure: CYSTOSCOPY WITH RETROGRADE PYELOGRAM;  Surgeon: Nickie Retort, MD;  Location: [facility];  Service: Urology;  Laterality: Left;  . CYSTOSCOPY W/ RETROGRADES Left 04/23/2017   Procedure: CYSTOSCOPY WITH RETROGRADE PYELOGRAM;  Surgeon: Abbie Sons, MD;  Location: [facility];  Service: Urology;  Laterality: Left;  . CYSTOSCOPY W/ URETERAL STENT PLACEMENT Left 04/23/2017   Procedure: CYSTOSCOPY WITH STENT REMOVAL;  Surgeon: Abbie Sons, MD;  Location: [facility];  Service: Urology;  Laterality: Left;  . CYSTOSCOPY WITH STENT PLACEMENT Left 01/01/2017   Procedure: CYSTOSCOPY WITH STENT PLACEMENT;  Surgeon: Nickie Retort, MD;  Location: [facility];  Service: Urology;  Laterality: Left;  . DILATION AND CURETTAGE OF UTERUS    . HYSTEROSCOPY W/D&C N/A 01/28/2016   Procedure: DILATATION AND CURETTAGE /HYSTEROSCOPY;  Surgeon: [name], MD;  Location: [facility];  Service: Gynecology;  Laterality: N/A;  . LAPAROSCOPIC BILATERAL SALPINGO OOPHERECTOMY Bilateral 02/26/2016   Procedure: LAPAROSCOPIC BILATERAL SALPINGO OOPHORECTOMY;  Surgeon: [name], MD;  Location: [facility];  Service: Gynecology;  Laterality: Bilateral;  . LAPAROSCOPIC HYSTERECTOMY N/A 02/26/2016   Procedure: HYSTERECTOMY TOTAL LAPAROSCOPIC;  Surgeon: [name], MD;  Location: [facility];  Service: Gynecology;  Laterality: N/A;  . SENTINEL NODE BIOPSY N/A 02/26/2016   Procedure: SENTINEL NODE BIOPSY;  Surgeon: [name], MD;  Location: [facility];  Service: Gynecology;  Laterality: N/A;  . TONSILLECTOMY      FAMILY HISTORY: Family History  Problem Relation Age of Onset  . Diabetes Father   . Heart disease Father   . Hypertension Father   .  Stroke Father   . Diabetes Paternal Grandmother   . Heart disease Mother   . Hypertension Mother   . Stroke Mother   . Hypertension Sister   . Thyroid disease Sister   . Hypertension  Brother     ADVANCED DIRECTIVES (Y/N):  N  HEALTH MAINTENANCE: Social History   Tobacco Use  . Smoking status: Former Research scientist (life sciences)  . Smokeless tobacco: Never Used  Substance Use Topics  . Alcohol use: No  . Drug use: No     Colonoscopy:  PAP:  Bone density:  Lipid panel:  Allergies  Allergen Reactions  . Sulfa Antibiotics Rash  . Zinc Rash    Current Outpatient Medications  Medication Sig Dispense Refill  . ELIQUIS 5 MG TABS tablet Take 5 mg by mouth 2 (two) times daily.  0  . furosemide (LASIX) 20 MG tablet Take 20 mg by mouth 2 (two) times daily.  0  . levothyroxine (SYNTHROID, LEVOTHROID) 150 MCG tablet TAKE 150 MCG BY MOUTH DAILY BEFORE BREAKFAST ON EMPTY STOMACH WITH A GLASS OF WATER 30-60MINS BEFORE BREAKFAST  2  . lidocaine (LIDODERM) 5 % Place 1 patch onto the skin daily as needed (for pain). Remove & Discard patch within 12 hours or as directed by MD    . Oxycodone HCl 10 MG TABS Take 1 tablet (10 mg total) by mouth every 4 (four) hours as needed. 60 tablet 0   No current facility-administered medications for this visit.      OBJECTIVE: Vitals:   09/15/17 1039 09/15/17 1048  BP:  134/81  Pulse:  71  Resp: 16   Temp:  98.1 F (36.7 C)     Body mass index is 25.56 kg/m.    ECOG FS:1 - Symptomatic but completely ambulatory  General: Well-developed, well-nourished, no acute distress. Eyes: Pink conjunctiva, anicteric sclera. HEENT: Normocephalic, moist mucous membranes, clear oropharnyx. Lungs: Clear to auscultation bilaterally. Heart: Regular rate and rhythm. No rubs, murmurs, or gallops. Abdomen: Soft, nontender, nondistended. No organomegaly noted, normoactive bowel sounds. Musculoskeletal: Left leg lymphedema significantly worse. Neuro: Alert, answering all questions appropriately. Cranial nerves grossly intact. Skin: No rashes or petechiae noted. Psych: Normal affect.  LAB RESULTS:  Lab Results  Component Value Date   NA 138 09/15/2017   K 3.6  09/15/2017   CL 102 09/15/2017   CO2 26 09/15/2017   GLUCOSE 106 (H) 09/15/2017   BUN 12 09/15/2017   CREATININE 0.78 09/15/2017   CALCIUM 8.9 09/15/2017   PROT 7.1 09/15/2017   ALBUMIN 3.7 09/15/2017   AST 18 09/15/2017   ALT 9 09/15/2017   ALKPHOS 76 09/15/2017   BILITOT 0.4 09/15/2017   GFRNONAA >60 09/15/2017   GFRAA >60 09/15/2017    Lab Results  Component Value Date   WBC 4.4 09/15/2017   NEUTROABS 3.3 09/15/2017   HGB 10.2 (L) 09/15/2017   HCT 29.5 (L) 09/15/2017   MCV 91.5 09/15/2017   PLT 267 09/15/2017     STUDIES: No results found.  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  PLAN:    1. 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 Adjuvant treatment was recommended at that time, but patient refused on multiple occations and missed multiple follow-up appointments in the interim.  After agreeing to adjuvant chemotherapy, patient finally completed 6 cycles of carboplatinum and Taxol on April 21, 2017.  PET scan results from May 26, 2017 reviewed independently with interval decrease  in size of left adnexal lesion.  There is residual hypermetabolic activity possibly indicating residual disease therefore patient was given a referral to radiation oncology for local control.  She has now completed her XRT, but unfortunately has worsening lymphedema in her left leg.  Because of this, we will get repeat imaging in the next 1 to 2 weeks to assess for any interval changes.  We will continue to hold chemotherapy at this time.  Return to clinic in 2 weeks after her CT scan to discuss the results and any additional diagnostic planning necessary.     2. Pain: Patient does not complain of this today.  Continue oxycodone as needed. 3. Hypokalemia: Patient's potassium is now within normal limits.  Continue oral potassium supplementation as prescribed. 4.   Lymphedema: Imaging as above. 5.  Anemia: Decreased, but chronic and unchanged.  Monitor.   Patient expressed understanding and was in agreement with this plan. She also understands that She can call clinic at any time with any questions, concerns, or complaints.   Cancer Staging No matching staging information was found for the patient.   [name], MD   09/19/2017 7:54 AM

## 2017-09-15 ENCOUNTER — Inpatient Hospital Stay (HOSPITAL_BASED_OUTPATIENT_CLINIC_OR_DEPARTMENT_OTHER): Payer: Medicare Other | Admitting: Oncology

## 2017-09-15 ENCOUNTER — Other Ambulatory Visit: Payer: Self-pay

## 2017-09-15 ENCOUNTER — Encounter: Payer: Self-pay | Admitting: Oncology

## 2017-09-15 ENCOUNTER — Inpatient Hospital Stay: Payer: Medicare Other | Attending: Oncology

## 2017-09-15 VITALS — BP 134/81 | HR 71 | Temp 98.1°F | Resp 16 | Ht 61.0 in | Wt 135.3 lb

## 2017-09-15 DIAGNOSIS — R5382 Chronic fatigue, unspecified: Secondary | ICD-10-CM | POA: Diagnosis not present

## 2017-09-15 DIAGNOSIS — R531 Weakness: Secondary | ICD-10-CM | POA: Insufficient documentation

## 2017-09-15 DIAGNOSIS — C541 Malignant neoplasm of endometrium: Secondary | ICD-10-CM | POA: Diagnosis not present

## 2017-09-15 DIAGNOSIS — Z87891 Personal history of nicotine dependence: Secondary | ICD-10-CM | POA: Insufficient documentation

## 2017-09-15 DIAGNOSIS — I890 Lymphedema, not elsewhere classified: Secondary | ICD-10-CM | POA: Diagnosis not present

## 2017-09-15 DIAGNOSIS — D649 Anemia, unspecified: Secondary | ICD-10-CM | POA: Diagnosis not present

## 2017-09-15 DIAGNOSIS — E876 Hypokalemia: Secondary | ICD-10-CM

## 2017-09-15 LAB — CBC WITH DIFFERENTIAL/PLATELET
BASOS ABS: 0 10*3/uL (ref 0–0.1)
Basophils Relative: 0 %
EOS PCT: 1 %
Eosinophils Absolute: 0 10*3/uL (ref 0–0.7)
HCT: 29.5 % — ABNORMAL LOW (ref 35.0–47.0)
Hemoglobin: 10.2 g/dL — ABNORMAL LOW (ref 12.0–16.0)
LYMPHS ABS: 0.7 10*3/uL — AB (ref 1.0–3.6)
LYMPHS PCT: 15 %
MCH: 31.7 pg (ref 26.0–34.0)
MCHC: 34.6 g/dL (ref 32.0–36.0)
MCV: 91.5 fL (ref 80.0–100.0)
MONO ABS: 0.3 10*3/uL (ref 0.2–0.9)
Monocytes Relative: 7 %
Neutro Abs: 3.3 10*3/uL (ref 1.4–6.5)
Neutrophils Relative %: 77 %
PLATELETS: 267 10*3/uL (ref 150–440)
RBC: 3.22 MIL/uL — AB (ref 3.80–5.20)
RDW: 14.7 % — AB (ref 11.5–14.5)
WBC: 4.4 10*3/uL (ref 3.6–11.0)

## 2017-09-15 LAB — COMPREHENSIVE METABOLIC PANEL
ALT: 9 U/L (ref 0–44)
AST: 18 U/L (ref 15–41)
Albumin: 3.7 g/dL (ref 3.5–5.0)
Alkaline Phosphatase: 76 U/L (ref 38–126)
Anion gap: 10 (ref 5–15)
BILIRUBIN TOTAL: 0.4 mg/dL (ref 0.3–1.2)
BUN: 12 mg/dL (ref 8–23)
CO2: 26 mmol/L (ref 22–32)
Calcium: 8.9 mg/dL (ref 8.9–10.3)
Chloride: 102 mmol/L (ref 98–111)
Creatinine, Ser: 0.78 mg/dL (ref 0.44–1.00)
GFR calc Af Amer: >60 mL/min (ref >60)
GLUCOSE: 106 mg/dL — AB (ref 70–99)
Potassium: 3.6 mmol/L (ref 3.5–5.1)
SODIUM: 138 mmol/L (ref 135–145)
TOTAL PROTEIN: 7.1 g/dL (ref 6.5–8.1)

## 2017-09-15 MED ORDER — OXYCODONE HCL 10 MG PO TABS: 10.0000 mg | ORAL_TABLET | ORAL | 0 refills | Status: DC | PRN

## 2017-09-15 NOTE — Progress Notes (Signed)
Patient here for follow up. She is having increase swelling and  Soreness in her left leg for the past few days.

## 2017-09-28 ENCOUNTER — Ambulatory Visit
Admission: RE | Admit: 2017-09-28 | Discharge: 2017-09-28 | Disposition: A | Discharge status: Home / Self Care | Payer: Medicare Other | Source: Ambulatory Visit | Attending: Oncology | Admitting: Oncology

## 2017-09-28 DIAGNOSIS — I700 Atherosclerosis of aorta: Secondary | ICD-10-CM | POA: Insufficient documentation

## 2017-09-28 DIAGNOSIS — M47896 Other spondylosis, lumbar region: Secondary | ICD-10-CM | POA: Insufficient documentation

## 2017-09-28 DIAGNOSIS — D7389 Other diseases of spleen: Secondary | ICD-10-CM | POA: Insufficient documentation

## 2017-09-28 DIAGNOSIS — C541 Malignant neoplasm of endometrium: Secondary | ICD-10-CM | POA: Diagnosis not present

## 2017-09-28 DIAGNOSIS — I2510 Atherosclerotic heart disease of native coronary artery without angina pectoris: Secondary | ICD-10-CM | POA: Diagnosis not present

## 2017-09-28 DIAGNOSIS — N200 Calculus of kidney: Secondary | ICD-10-CM | POA: Insufficient documentation

## 2017-09-28 DIAGNOSIS — M5136 Other intervertebral disc degeneration, lumbar region: Secondary | ICD-10-CM | POA: Insufficient documentation

## 2017-09-28 DIAGNOSIS — R590 Localized enlarged lymph nodes: Secondary | ICD-10-CM | POA: Diagnosis not present

## 2017-09-28 MED ORDER — IOHEXOL 300 MG/ML  SOLN
100.0000 mL | Freq: Once | INTRAMUSCULAR | Status: AC | PRN
  Administered 2017-09-28: 100 mL via INTRAVENOUS

## 2017-10-05 DIAGNOSIS — D649 Anemia, unspecified: Secondary | ICD-10-CM | POA: Diagnosis not present

## 2017-10-05 DIAGNOSIS — M7989 Other specified soft tissue disorders: Secondary | ICD-10-CM | POA: Diagnosis not present

## 2017-10-05 DIAGNOSIS — E785 Hyperlipidemia, unspecified: Secondary | ICD-10-CM | POA: Diagnosis not present

## 2017-10-05 DIAGNOSIS — M65311 Trigger thumb, right thumb: Secondary | ICD-10-CM | POA: Diagnosis not present

## 2017-10-05 DIAGNOSIS — J309 Allergic rhinitis, unspecified: Secondary | ICD-10-CM | POA: Diagnosis not present

## 2017-10-05 DIAGNOSIS — I10 Essential (primary) hypertension: Secondary | ICD-10-CM | POA: Diagnosis not present

## 2017-10-11 NOTE — Progress Notes (Signed)
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  INTERVAL HISTORY: Patient returns to clinic today for further evaluation and discussion of her imaging results.  Continues to have worsening swelling in her bilateral legs, but otherwise feels well.  Her weakness and fatigue have improved.  She has no neurologic complaints. She denies any recent fevers or illnesses. She denies any chest pain or shortness of breath.  She denies any nausea, vomiting, constipation, or diarrhea.  She has no urinary complaints.  Patient otherwise feels well and offers no further specific complaints.  REVIEW OF SYSTEMS:   Review of Systems  Constitutional: Negative for fever, malaise/fatigue and weight loss.  Respiratory: Negative.  Negative for cough and shortness of breath.   Cardiovascular: Positive for leg swelling. Negative for chest pain.  Gastrointestinal: Negative.  Negative for abdominal pain, constipation, nausea and vomiting.  Genitourinary: Negative.  Negative for dysuria.  Musculoskeletal: Negative.  Negative for joint pain.  Skin: Negative.  Negative for rash.  Neurological: Negative.  Negative for sensory change, focal weakness, weakness and headaches.  Psychiatric/Behavioral: Negative.  The patient is not nervous/anxious.     As per HPI. Otherwise, a complete review of systems is negative.  PAST MEDICAL HISTORY: Past Medical History:  Diagnosis Date  . Arthritis   . CHF (congestive heart failure) ([place])   . Constipation   . DVT (deep venous thrombosis) ([place]) 03/2017   left leg  . Endometrial cancer ([place]) 01/28/2016  . Hyperlipidemia   . Hypertension   . Hypothyroidism     PAST SURGICAL HISTORY: Past Surgical History:    Procedure Laterality Date  . CYSTOSCOPY W/ RETROGRADES Left 01/01/2017   Procedure: CYSTOSCOPY WITH RETROGRADE PYELOGRAM;  Surgeon: Nickie Retort, MD;  Location: [facility];  Service: Urology;  Laterality: Left;  . CYSTOSCOPY W/ RETROGRADES Left 04/23/2017   Procedure: CYSTOSCOPY WITH RETROGRADE PYELOGRAM;  Surgeon: Abbie Sons, MD;  Location: [facility];  Service: Urology;  Laterality: Left;  . CYSTOSCOPY W/ URETERAL STENT PLACEMENT Left 04/23/2017   Procedure: CYSTOSCOPY WITH STENT REMOVAL;  Surgeon: Abbie Sons, MD;  Location: [facility];  Service: Urology;  Laterality: Left;  . CYSTOSCOPY WITH STENT PLACEMENT Left 01/01/2017   Procedure: CYSTOSCOPY WITH STENT PLACEMENT;  Surgeon: Nickie Retort, MD;  Location: [facility];  Service: Urology;  Laterality: Left;  . DILATION AND CURETTAGE OF UTERUS    . HYSTEROSCOPY W/D&C N/A 01/28/2016   Procedure: DILATATION AND CURETTAGE /HYSTEROSCOPY;  Surgeon: [name], MD;  Location: [facility];  Service: Gynecology;  Laterality: N/A;  . LAPAROSCOPIC BILATERAL SALPINGO OOPHERECTOMY Bilateral 02/26/2016   Procedure: LAPAROSCOPIC BILATERAL SALPINGO OOPHORECTOMY;  Surgeon: [name], MD;  Location: [facility];  Service: Gynecology;  Laterality: Bilateral;  . LAPAROSCOPIC HYSTERECTOMY N/A 02/26/2016   Procedure: HYSTERECTOMY TOTAL LAPAROSCOPIC;  Surgeon: [name], MD;  Location: [facility];  Service: Gynecology;  Laterality: N/A;  . SENTINEL NODE BIOPSY N/A 02/26/2016   Procedure: SENTINEL NODE BIOPSY;  Surgeon: [name], MD;  Location: [facility];  Service: Gynecology;  Laterality: N/A;  . TONSILLECTOMY      FAMILY HISTORY: Family History  Problem Relation Age of Onset  . Diabetes Father   . Heart disease Father   . Hypertension Father   . Stroke  Father   . Diabetes Paternal Grandmother   . Heart disease Mother   . Hypertension Mother   . Stroke Mother   . Hypertension Sister   . Thyroid disease Sister   . Hypertension  Brother     ADVANCED DIRECTIVES (Y/N):  N  HEALTH MAINTENANCE: Social History   Tobacco Use  . Smoking status: Former Research scientist (life sciences)  . Smokeless tobacco: Never Used  Substance Use Topics  . Alcohol use: No  . Drug use: No     Colonoscopy:  PAP:  Bone density:  Lipid panel:  Allergies  Allergen Reactions  . Sulfa Antibiotics Rash  . Zinc Rash    Current Outpatient Medications  Medication Sig Dispense Refill  . furosemide (LASIX) 20 MG tablet Take 20 mg by mouth 2 (two) times daily.  0  . levothyroxine (SYNTHROID, LEVOTHROID) 150 MCG tablet TAKE 150 MCG BY MOUTH DAILY BEFORE BREAKFAST ON EMPTY STOMACH WITH A GLASS OF WATER 30-60MINS BEFORE BREAKFAST  2  . Oxycodone HCl 10 MG TABS Take 1 tablet (10 mg total) by mouth every 4 (four) hours as needed. 60 tablet 0  . ELIQUIS 5 MG TABS tablet Take 2 tablets (10 mg total) by mouth 2 (two) times daily. 120 tablet 5   No current facility-administered medications for this visit.      OBJECTIVE: Vitals:   10/12/17 1338 10/12/17 1344  BP:  (!) 145/86  Pulse:  73  Resp: 16   Temp:  99.1 F (37.3 C)     Body mass index is 24.94 kg/m.    ECOG FS:1 - Symptomatic but completely ambulatory  General: Well-developed, well-nourished, no acute distress. Eyes: Pink conjunctiva, anicteric sclera. HEENT: Normocephalic, moist mucous membranes, clear oropharnyx. Lungs: Clear to auscultation bilaterally. Heart: Regular rate and rhythm. No rubs, murmurs, or gallops. Abdomen: Soft, nontender, nondistended. No organomegaly noted, normoactive bowel sounds. Musculoskeletal: Bilateral lower extremity edema, left worse than right. Neuro: Alert, answering all questions appropriately. Cranial nerves grossly intact. Skin: No rashes or petechiae noted. Psych: Normal affect.  LAB RESULTS:  Lab Results  Component Value Date   NA 138 09/15/2017   K 3.6 09/15/2017   CL 102 09/15/2017   CO2 26 09/15/2017   GLUCOSE 106 (H) 09/15/2017   BUN 12  09/15/2017   CREATININE 0.78 09/15/2017   CALCIUM 8.9 09/15/2017   PROT 7.1 09/15/2017   ALBUMIN 3.7 09/15/2017   AST 18 09/15/2017   ALT 9 09/15/2017   ALKPHOS 76 09/15/2017   BILITOT 0.4 09/15/2017   GFRNONAA >60 09/15/2017   GFRAA >60 09/15/2017    Lab Results  Component Value Date   WBC 4.4 09/15/2017   NEUTROABS 3.3 09/15/2017   HGB 10.2 (L) 09/15/2017   HCT 29.5 (L) 09/15/2017   MCV 91.5 09/15/2017   PLT 267 09/15/2017     STUDIES: Ct Abdomen Pelvis W Contrast  Result Date: 09/28/2017 CLINICAL DATA:  Metastatic endometrial cancer, prior chemotherapy and radiation therapy. Restaging assessment. EXAM: CT ABDOMEN AND PELVIS WITH CONTRAST TECHNIQUE: Multidetector CT imaging of the abdomen and pelvis was performed using the standard protocol following bolus administration of intravenous contrast. CONTRAST:  160mL OMNIPAQUE IOHEXOL 300 MG/ML  SOLN COMPARISON:  Multiple exams, including PET-CT from 05/26/2017 and CT scan from 03/26/2017 FINDINGS: Lower chest: Scarring in both lower lobes noted. Left anterior descending, circumflex, and right coronary artery atherosclerotic calcification is present. Hepatobiliary: Unremarkable Pancreas: Unremarkable Spleen: Stable 1.2 by 0.9 cm inferior splenic hypodense lesion on image 26/2, not appreciably abnormally  hypermetabolic on recent PET-CT. Adrenals/Urinary Tract: Mild scarring of the right kidney upper pole. 2 hypodense lesions in the right mid kidney favor cysts and are not appreciably changed from prior. 2 mm left kidney lower pole nonobstructive renal calculus. Stomach/Bowel: Nondistention of the stomach with mild smooth wall thickening throughout the stomach, cannot exclude gastritis. Scattered sigmoid colon diverticula. Vascular/Lymphatic: Aortoiliac atherosclerotic vascular disease. Infrarenal IVC filter. There is reduction in size of a precaval lymph node, currently 0.7 cm in short axis on image 40/2 and previously 1.1 cm on 03/26/2017.  However, a right common iliac lymph node measures 1.5 cm in short axis and was previously only 0.6 cm even as recently as 05/26/2017. A primarily hypodense left pelvic sidewall mass measures 4.7 by 3.9 cm on image 61/2, formerly 5.2 by 6.0 cm on 03/26/2017 and 4.8 by 4.7 cm on 05/26/2017, and accordingly further improved. Vessels deviate around this mass which is probably an obturator lymph node. Reproductive: The uterus is absent. Other: No supplemental non-categorized findings. Musculoskeletal: Degenerative disc disease most notably at L4-5 along with mild spondylosis. IMPRESSION: 1. Further reduction in size of the left pelvic sidewall mass, and prominent reduction in size of a precaval lymph node. However, there has been some interval enlargement of a right common iliac lymph node compared to the prior exams, giving an overall mixed appearance. 2. Other imaging findings of potential clinical significance: Aortic Atherosclerosis (ICD10-I70.0). Coronary atherosclerosis. Stable hypodense splenic lesion, not previously hypermetabolic. Possible gastritis. Mild scarring in the right kidney upper pole. Degenerative disc disease at L4-5 with lower lumbar spondylosis. 2 mm nonobstructive left kidney lower pole renal calculus. Electronically Signed   By: Van Clines M.D.   On: 09/28/2017 15:52   US Venous Img Lower Bilateral  Result Date: 10/12/2017 CLINICAL DATA:  Leg swelling for 2 months. Evaluate for DVT. History of endometrial carcinoma. Patient has an IVC filter. EXAM: BILATERAL LOWER EXTREMITY VENOUS DOPPLER ULTRASOUND TECHNIQUE: Gray-scale sonography with graded compression, as well as color Doppler and duplex ultrasound were performed to evaluate the lower extremity deep venous systems from the level of the common femoral vein and including the common femoral, femoral, profunda femoral, popliteal and calf veins including the posterior tibial, peroneal and gastrocnemius veins when visible. The  superficial great saphenous vein was also interrogated. Spectral Doppler was utilized to evaluate flow at rest and with distal augmentation maneuvers in the common femoral, femoral and popliteal veins. COMPARISON:  None. FINDINGS: RIGHT LOWER EXTREMITY Common Femoral Vein: No evidence of thrombus. Normal compressibility, respiratory phasicity and response to augmentation. Saphenofemoral Junction: No evidence of thrombus. Normal compressibility and flow on color Doppler imaging. Profunda Femoral Vein: No evidence of thrombus. Normal compressibility and flow on color Doppler imaging. Femoral Vein: No evidence of thrombus. Normal compressibility, respiratory phasicity and response to augmentation. Popliteal Vein: No evidence of thrombus. Normal compressibility, respiratory phasicity and response to augmentation. Calf Veins: No evidence of thrombus. Normal compressibility and flow on color Doppler imaging. Venous Reflux:  None. Other Findings:  None. LEFT LOWER EXTREMITY Common Femoral Vein: Positive for thrombus. The left common femoral vein is non compressible and there is nonocclusive thrombus within the left common femoral vein. Saphenofemoral Junction: Incomplete compressibility of the left saphenofemoral junction. There appears to be nonocclusive thrombus at the saphenofemoral junction and proximal GSV. Profunda Femoral Vein: No evidence of thrombus. Normal color Doppler flow. Femoral Vein: No evidence of thrombus. Normal compressibility and color Doppler flow. Popliteal Vein: No evidence of thrombus. Normal compressibility and color  Doppler flow. Calf Veins: No evidence of thrombus. Normal compressibility and color Doppler flow in the visualized deep left calf veins. Venous Reflux:  None. Other Findings:  None. IMPRESSION: Positive for deep venous thrombosis in the left lower extremity. Thrombus involving the left common femoral vein and the left saphenofemoral junction. Review of prior CT imaging suggests this  could be a chronic finding and secondary to the left pelvic sidewall mass. However, this thrombus is age-indeterminate but favor subacute or chronic. No other thrombus in the left lower extremity. Negative for deep venous thrombosis in the right lower extremity. Electronically Signed   By: Markus Daft M.D.   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  PLAN:    1. 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 Adjuvant treatment was recommended at that time, but patient refused on multiple occations and missed multiple follow-up appointments in the interim.  After agreeing to adjuvant chemotherapy, patient finally completed 6 cycles of carboplatinum and Taxol on April 21, 2017.  PET scan results from May 26, 2017 reviewed independently with interval decrease in size of left adnexal lesion.  There is residual hypermetabolic activity possibly indicating residual disease therefore patient was given a referral to radiation oncology for local control.  Patient completed adjuvant XRT.  CT scan results reviewed independently and report as above with improving disease burden.  Return to clinic in 3 months with repeat imaging and further evaluation.  2. Pain: Patient does not complain of this today.  Continue oxycodone as needed. 3. Hypokalemia: Resolved.  Continue oral potassium supplementation as prescribed. 4.  Lymphedema: Improvement of disease burden, unfortunately patient continues to have DVT.  Unclear if this is worsening acute or continued chronic.  She has been instructed to increase her dose of Eliquis to 10 mg twice a day. 5.  Anemia: Chronic and unchanged.  Monitor.   Patient expressed understanding and was in agreement with this plan. She also understands that She can call clinic at any time with any questions, concerns, or complaints.   Cancer  Staging No matching staging information was found for the patient.   [name], MD   10/15/2017 12:53 PM

## 2017-10-12 ENCOUNTER — Encounter: Payer: Self-pay | Admitting: Oncology

## 2017-10-12 ENCOUNTER — Ambulatory Visit
Admission: RE | Admit: 2017-10-12 | Discharge: 2017-10-12 | Disposition: A | Discharge status: Home / Self Care | Payer: Medicare Other | Source: Ambulatory Visit | Attending: Oncology | Admitting: Oncology

## 2017-10-12 ENCOUNTER — Inpatient Hospital Stay: Payer: Medicare Other

## 2017-10-12 ENCOUNTER — Other Ambulatory Visit: Payer: Self-pay

## 2017-10-12 ENCOUNTER — Inpatient Hospital Stay: Payer: Medicare Other | Attending: Oncology | Admitting: Oncology

## 2017-10-12 VITALS — BP 145/86 | HR 73 | Temp 99.1°F | Resp 16 | Ht 61.0 in | Wt 132.0 lb

## 2017-10-12 DIAGNOSIS — I890 Lymphedema, not elsewhere classified: Secondary | ICD-10-CM | POA: Diagnosis not present

## 2017-10-12 DIAGNOSIS — I82409 Acute embolism and thrombosis of unspecified deep veins of unspecified lower extremity: Secondary | ICD-10-CM | POA: Diagnosis not present

## 2017-10-12 DIAGNOSIS — C541 Malignant neoplasm of endometrium: Secondary | ICD-10-CM

## 2017-10-12 DIAGNOSIS — I82412 Acute embolism and thrombosis of left femoral vein: Secondary | ICD-10-CM | POA: Diagnosis not present

## 2017-10-12 DIAGNOSIS — I82402 Acute embolism and thrombosis of unspecified deep veins of left lower extremity: Secondary | ICD-10-CM | POA: Insufficient documentation

## 2017-10-12 DIAGNOSIS — Z87891 Personal history of nicotine dependence: Secondary | ICD-10-CM | POA: Insufficient documentation

## 2017-10-12 DIAGNOSIS — D649 Anemia, unspecified: Secondary | ICD-10-CM

## 2017-10-12 MED ORDER — ELIQUIS 5 MG PO TABS: 5.0000 mg | ORAL_TABLET | Freq: Two times a day (BID) | ORAL | 5 refills | Status: DC

## 2017-10-12 NOTE — Progress Notes (Signed)
Patient here for follow up. She has bilateral swelling of her legs. Appetite good, bowel and bladder WNL.

## 2017-10-13 ENCOUNTER — Telehealth: Payer: Self-pay | Admitting: *Deleted

## 2017-10-13 MED ORDER — ELIQUIS 5 MG PO TABS: 10.0000 mg | ORAL_TABLET | Freq: Two times a day (BID) | ORAL | 5 refills | Status: DC

## 2017-10-13 NOTE — Telephone Encounter (Signed)
Per Dr Grayland Ormond He was not aware that patient was still taking Eliquis, he ordered to increase her dose to 10 mg twice a day. New prescription escribed and patient was contacted and given instructions to increase her dose to 2 tabs (10 mg) twice a day. She repeated this back to me and stated she had already taken one this morning and she was asked to go ahead and take another one now then take 2 tabs tonight and going forward. She stated she would do this

## 2017-10-13 NOTE — Telephone Encounter (Signed)
Patient called and states that she has a clot in her legs and was told an prescription would be sent in for her and Eliquis was sent in , the problem is that she was already on Eliquis twice a day and she is asking if there should be new directions or if she needs something else ordered.Please advise   IMPRESSION: Positive for deep venous thrombosis in the left lower extremity. Thrombus involving the left common femoral vein and the left saphenofemoral junction. Review of prior CT imaging suggests this could be a chronic finding and secondary to the left pelvic sidewall mass. However, this thrombus is age-indeterminate but favor subacute or chronic. No other thrombus in the left lower extremity.  Negative for deep venous thrombosis in the right lower extremity.   Electronically Signed   By: Markus Daft M.D.   On: 10/12/2017 15:48

## 2017-10-18 ENCOUNTER — Other Ambulatory Visit: Payer: Self-pay | Admitting: *Deleted

## 2017-10-18 MED ORDER — OXYCODONE HCL 10 MG PO TABS: 10.0000 mg | ORAL_TABLET | ORAL | 0 refills | Status: DC | PRN

## 2017-10-20 ENCOUNTER — Other Ambulatory Visit: Payer: Self-pay

## 2017-10-20 ENCOUNTER — Ambulatory Visit
Admission: RE | Admit: 2017-10-20 | Discharge: 2017-10-20 | Disposition: A | Discharge status: Home / Self Care | Payer: Medicare Other | Source: Ambulatory Visit | Attending: Radiation Oncology | Admitting: Radiation Oncology

## 2017-10-20 ENCOUNTER — Encounter: Payer: Self-pay | Admitting: Radiation Oncology

## 2017-10-20 VITALS — BP 129/80 | HR 75 | Temp 98.3°F | Resp 18 | Wt 131.2 lb

## 2017-10-20 DIAGNOSIS — Z90710 Acquired absence of both cervix and uterus: Secondary | ICD-10-CM | POA: Diagnosis not present

## 2017-10-20 DIAGNOSIS — Z86718 Personal history of other venous thrombosis and embolism: Secondary | ICD-10-CM | POA: Diagnosis not present

## 2017-10-20 DIAGNOSIS — Z90722 Acquired absence of ovaries, bilateral: Secondary | ICD-10-CM | POA: Diagnosis not present

## 2017-10-20 DIAGNOSIS — Z923 Personal history of irradiation: Secondary | ICD-10-CM | POA: Diagnosis not present

## 2017-10-20 DIAGNOSIS — C786 Secondary malignant neoplasm of retroperitoneum and peritoneum: Secondary | ICD-10-CM | POA: Diagnosis not present

## 2017-10-20 DIAGNOSIS — C541 Malignant neoplasm of endometrium: Secondary | ICD-10-CM | POA: Diagnosis not present

## 2017-10-20 DIAGNOSIS — Z7901 Long term (current) use of anticoagulants: Secondary | ICD-10-CM | POA: Diagnosis not present

## 2017-10-20 NOTE — Progress Notes (Signed)
Radiation Oncology Follow up Note  Name: Vickie Barton   Date:   10/20/2017 MRN:  [id number] DOB: 1931-02-24    This 84 y.o. 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 She then presented with abdominal pain and a large pelvic mass with hypermetabolic activity on PET CT scan concerning for recurrent endometrial carcinoma. She received 6 cycles of carboplatinum and Taxol with still residual hypermetabolic activity in the periphery the large pelvic lesion..she underwent both external beam radiation therapy as well as vaginal brachytherapy is now seen 1 month out. She states her bowels are functioning well she's having no significant increased lower urinary tract symptoms. She does have swelling in her lower extremities which has prompted venous ultrasound showing a deep venous thrombosis in the left lower extremity involving the left common femoral vein and left saphenofemoral junction.CT scan of the abdomen and pelvis showed mixed response with marked reduction in left pelvic sidewall mass and precaval lymph nodes however there's been some interval enlargement the right common iliac node.she's been put on her a double dose ofEliquiss.  COMPLICATIONS OF TREATMENT: none  FOLLOW UP COMPLIANCE: keeps appointments   PHYSICAL EXAM:  BP 129/80   Pulse 75   Temp 98.3 F (36.8 C)   Resp 18   Wt 131 lb 2.8 oz (59.5 kg)   BMI 24.79 kg/m  Well-developed well-nourished patient in NAD. HEENT reveals PERLA, EOMI, discs not visualized.  Oral cavity is clear. No oral mucosal lesions are identified. Neck is clear without evidence of cervical or supraclavicular adenopathy. Lungs are clear  to A&P. Cardiac examination is essentially unremarkable with regular rate and rhythm without murmur rub or thrill. Abdomen is benign with no organomegaly or masses noted. Motor sensory and DTR levels are equal and symmetric in the upper and lower extremities. Cranial nerves II through XII are grossly intact. Proprioception is intact. No peripheral adenopathy or edema is identified. No motor or sensory levels are noted. Crude visual fields are within normal range.still some persistent swelling in the left lower extremity. Pelvic exam was not performed today  RADIOLOGY RESULTS: CT scans and ultrasound reviewed and compatible with the above-stated findings  PLAN: resent time she's had a mixed response I would continue to observe the patient at this time. She continues close follow-up care with medical oncology. I've asked to see her back in 4 months after her next CT scan. Further recommendations based on that scan will be made at that time. Patient is to call sooner with any concerns.  I would like to take this opportunity to thank you for allowing me to participate in the care of your patient.Noreene Filbert, MD

## 2017-11-18 ENCOUNTER — Other Ambulatory Visit: Payer: Self-pay | Admitting: *Deleted

## 2017-11-18 MED ORDER — OXYCODONE HCL 10 MG PO TABS: 10.0000 mg | ORAL_TABLET | ORAL | 0 refills | Status: DC | PRN

## 2017-12-16 ENCOUNTER — Telehealth: Payer: Self-pay | Admitting: *Deleted

## 2017-12-16 NOTE — Telephone Encounter (Signed)
When she called pharmacy to refill her Eliquis, she got prescription for 1 tablet twice a day and she has been taking 2 tabs twice a day, Asking if her dose was changed. I checked her chart and see that a prescription for 1 twice a day was sent on 7/23 with 5 refill and another sent on 7/24 for 2 twice a day. I called pharmacist at [facility] and discussed with pharmacist that when patient called for refill that they used the old prescription and not the current one for 2 twice a day. I asked that she void the old prescription so this does not happen again. She agreed and states she will call the patient and discuss with her the dosing and what to do to make this right for her. I then waited 10 minutes and called the patient who said that the pharmacist has not contacted her as of yet. I suggested if by lunch they have not called her to call them or go to the pharmacy. She agreed and stated "I will continue to take 2 tabs twice a day".

## 2017-12-21 ENCOUNTER — Other Ambulatory Visit: Payer: Self-pay | Admitting: *Deleted

## 2017-12-21 MED ORDER — OXYCODONE HCL 10 MG PO TABS: 10.0000 mg | ORAL_TABLET | ORAL | 0 refills | Status: DC | PRN

## 2017-12-21 NOTE — Telephone Encounter (Signed)
Patient called [facility] requesting refill of oxycodone.   As mandated by the [place] STOP Act (Strengthen Opioid Misuse Prevention), the [place] Controlled Substance Reporting System ([place]) was reviewed for this patient.  Below is the past 33-months of controlled substance prescriptions as displayed by the registry.  I have personally consulted with my supervising physician, Dr. Grayland Ormond, who agrees that continuation of opiate therapy is medically appropriate at this time and agrees to provide continual monitoring, including urine/blood drug screens, as indicated. Prescription sent electronically using [employer] secure transmission to requested pharmacy.   [place] Reviewed:     Beckey Rutter, DNP, AGNP-C [place] at [facility] [phone] (work cell) [phone] (office)

## 2018-01-10 ENCOUNTER — Ambulatory Visit
Admission: RE | Admit: 2018-01-10 | Discharge: 2018-01-10 | Disposition: A | Discharge status: Home / Self Care | Payer: Medicare Other | Source: Ambulatory Visit | Attending: Oncology | Admitting: Oncology

## 2018-01-10 DIAGNOSIS — I2510 Atherosclerotic heart disease of native coronary artery without angina pectoris: Secondary | ICD-10-CM | POA: Diagnosis not present

## 2018-01-10 DIAGNOSIS — N200 Calculus of kidney: Secondary | ICD-10-CM | POA: Insufficient documentation

## 2018-01-10 DIAGNOSIS — C541 Malignant neoplasm of endometrium: Secondary | ICD-10-CM | POA: Diagnosis not present

## 2018-01-10 DIAGNOSIS — I700 Atherosclerosis of aorta: Secondary | ICD-10-CM | POA: Insufficient documentation

## 2018-01-10 DIAGNOSIS — K5730 Diverticulosis of large intestine without perforation or abscess without bleeding: Secondary | ICD-10-CM | POA: Insufficient documentation

## 2018-01-10 LAB — POCT I-STAT CREATININE: CREATININE: 0.8 mg/dL (ref 0.44–1.00)

## 2018-01-10 MED ORDER — IOHEXOL 300 MG/ML  SOLN
75.0000 mL | Freq: Once | INTRAMUSCULAR | Status: AC | PRN
  Administered 2018-01-10: 75 mL via INTRAVENOUS

## 2018-01-10 MED ORDER — IOPAMIDOL (ISOVUE-300) INJECTION 61%: 75.0000 mL | Freq: Once | INTRAVENOUS | Status: DC | PRN

## 2018-01-16 NOTE — Progress Notes (Signed)
[place]  Telephone:([phone]) [phone] Fax:(336) [phone]  ID: Vickie Barton OB: 01/10/1931  MR#: [id number]  IFO#:[id number]  Patient Care Team: [name], MD as PCP - General (Family Medicine)  CHIEF COMPLAINT: Recurrent and progressive stage IIIa high-grade serous endometrial carcinoma.  INTERVAL HISTORY: Patient returns to clinic today for further evaluation and discussion of her imaging results.  She continues to have bilateral lower extremity edema, but otherwise feels well.  She has mild weakness and fatigue.  She has no neurologic complaints. She denies any recent fevers or illnesses. She denies any chest pain or shortness of breath.  She denies any nausea, vomiting, constipation, or diarrhea.  She has no urinary complaints.  Patient offers no further specific complaints today.  REVIEW OF SYSTEMS:   Review of Systems  Constitutional: Positive for malaise/fatigue. Negative for fever and weight loss.  Respiratory: Negative.  Negative for cough and shortness of breath.   Cardiovascular: Positive for leg swelling. Negative for chest pain.  Gastrointestinal: Negative.  Negative for abdominal pain, constipation, nausea and vomiting.  Genitourinary: Negative.  Negative for dysuria.  Musculoskeletal: Negative.  Negative for joint pain.  Skin: Negative.  Negative for rash.  Neurological: Positive for weakness. Negative for sensory change, focal weakness and headaches.  Psychiatric/Behavioral: Negative.  The patient is not nervous/anxious.     As per HPI. Otherwise, a complete review of systems is negative.  PAST MEDICAL HISTORY: Past Medical History:  Diagnosis Date  . Arthritis   . CHF (congestive heart failure) ([place])   . Constipation   . DVT (deep venous thrombosis) ([place]) 03/2017   left leg  . Endometrial cancer ([place]) 01/28/2016  . Hyperlipidemia   . Hypertension   . Hypothyroidism     PAST SURGICAL HISTORY: Past Surgical History:  Procedure  Laterality Date  . CYSTOSCOPY W/ RETROGRADES Left 01/01/2017   Procedure: CYSTOSCOPY WITH RETROGRADE PYELOGRAM;  Surgeon: Nickie Retort, MD;  Location: [facility];  Service: Urology;  Laterality: Left;  . CYSTOSCOPY W/ RETROGRADES Left 04/23/2017   Procedure: CYSTOSCOPY WITH RETROGRADE PYELOGRAM;  Surgeon: Abbie Sons, MD;  Location: [facility];  Service: Urology;  Laterality: Left;  . CYSTOSCOPY W/ URETERAL STENT PLACEMENT Left 04/23/2017   Procedure: CYSTOSCOPY WITH STENT REMOVAL;  Surgeon: Abbie Sons, MD;  Location: [facility];  Service: Urology;  Laterality: Left;  . CYSTOSCOPY WITH STENT PLACEMENT Left 01/01/2017   Procedure: CYSTOSCOPY WITH STENT PLACEMENT;  Surgeon: Nickie Retort, MD;  Location: [facility];  Service: Urology;  Laterality: Left;  . DILATION AND CURETTAGE OF UTERUS    . HYSTEROSCOPY W/D&C N/A 01/28/2016   Procedure: DILATATION AND CURETTAGE /HYSTEROSCOPY;  Surgeon: [name], MD;  Location: [facility];  Service: Gynecology;  Laterality: N/A;  . LAPAROSCOPIC BILATERAL SALPINGO OOPHERECTOMY Bilateral 02/26/2016   Procedure: LAPAROSCOPIC BILATERAL SALPINGO OOPHORECTOMY;  Surgeon: [name], MD;  Location: [facility];  Service: Gynecology;  Laterality: Bilateral;  . LAPAROSCOPIC HYSTERECTOMY N/A 02/26/2016   Procedure: HYSTERECTOMY TOTAL LAPAROSCOPIC;  Surgeon: [name], MD;  Location: [facility];  Service: Gynecology;  Laterality: N/A;  . SENTINEL NODE BIOPSY N/A 02/26/2016   Procedure: SENTINEL NODE BIOPSY;  Surgeon: [name], MD;  Location: [facility];  Service: Gynecology;  Laterality: N/A;  . TONSILLECTOMY      FAMILY HISTORY: Family History  Problem Relation Age of Onset  . Diabetes Father   . Heart disease Father   . Hypertension Father   . Stroke Father   .  Diabetes Paternal Grandmother   . Heart disease Mother   . Hypertension Mother   . Stroke Mother   . Hypertension Sister   . Thyroid disease Sister   . Hypertension Brother      ADVANCED DIRECTIVES (Y/N):  N  HEALTH MAINTENANCE: Social History   Tobacco Use  . Smoking status: Former Research scientist (life sciences)  . Smokeless tobacco: Never Used  Substance Use Topics  . Alcohol use: No  . Drug use: No     Colonoscopy:  PAP:  Bone density:  Lipid panel:  Allergies  Allergen Reactions  . Sulfa Antibiotics Rash  . Zinc Rash    Current Outpatient Medications  Medication Sig Dispense Refill  . ELIQUIS 5 MG TABS tablet Take 2 tablets (10 mg total) by mouth 2 (two) times daily. 120 tablet 5  . furosemide (LASIX) 20 MG tablet Take 20 mg by mouth 2 (two) times daily.  0  . levothyroxine (SYNTHROID, LEVOTHROID) 150 MCG tablet TAKE 150 MCG BY MOUTH DAILY BEFORE BREAKFAST ON EMPTY STOMACH WITH A GLASS OF WATER 30-60MINS BEFORE BREAKFAST  2  . Oxycodone HCl 10 MG TABS Take 1 tablet (10 mg total) by mouth daily as needed. 30 tablet 0   No current facility-administered medications for this visit.      OBJECTIVE: Vitals:   01/18/18 1433 01/18/18 1437  BP:  123/82  Pulse:  65  Resp: 12   Temp:  98.1 F (36.7 C)     Body mass index is 25.64 kg/m.    ECOG FS:1 - Symptomatic but completely ambulatory  General: Well-developed, well-nourished, no acute distress. Eyes: Pink conjunctiva, anicteric sclera. HEENT: Normocephalic, moist mucous membranes. Lungs: Clear to auscultation bilaterally. Heart: Regular rate and rhythm. No rubs, murmurs, or gallops. Abdomen: Soft, nontender, nondistended. No organomegaly noted, normoactive bowel sounds. Musculoskeletal: Bilateral lower extremity edema, left worse than right.   Neuro: Alert, answering all questions appropriately. Cranial nerves grossly intact. Skin: No rashes or petechiae noted. Psych: Normal affect.  LAB RESULTS:  Lab Results  Component Value Date   NA 135 01/18/2018   K 3.9 01/18/2018   CL 100 01/18/2018   CO2 28 01/18/2018   GLUCOSE 84 01/18/2018   BUN 16 01/18/2018   CREATININE 0.79 01/18/2018   CALCIUM 9.2  01/18/2018   PROT 7.7 01/18/2018   ALBUMIN 4.1 01/18/2018   AST 18 01/18/2018   ALT 10 01/18/2018   ALKPHOS 98 01/18/2018   BILITOT 0.5 01/18/2018   GFRNONAA >60 01/18/2018   GFRAA >60 01/18/2018    Lab Results  Component Value Date   WBC 4.7 01/18/2018   NEUTROABS 2.4 01/18/2018   HGB 11.2 (L) 01/18/2018   HCT 35.5 (L) 01/18/2018   MCV 91.3 01/18/2018   PLT 245 01/18/2018     STUDIES: Ct Abdomen Pelvis W Contrast  Result Date: 01/10/2018 CLINICAL DATA:  84 year old female with history of endometrial cancer. Status post hysterectomy, radiation therapy and chemotherapy. Restaging examination. EXAM: CT ABDOMEN AND PELVIS WITH CONTRAST TECHNIQUE: Multidetector CT imaging of the abdomen and pelvis was performed using the standard protocol following bolus administration of intravenous contrast. CONTRAST:  <See Chart> ISOVUE-300 IOPAMIDOL (ISOVUE-300) INJECTION 61%, 55mL OMNIPAQUE IOHEXOL 300 MG/ML SOLN COMPARISON:  CT the abdomen and pelvis 09/28/2017. FINDINGS: Lower chest: Scarring in the lung bases bilaterally. Hepatobiliary: No suspicious cystic or solid hepatic lesions. No intra or extrahepatic biliary ductal dilatation. Gallbladder is normal in appearance. Pancreas: No pancreatic mass. No pancreatic ductal dilatation. No pancreatic or peripancreatic fluid or  inflammatory changes. Spleen: 10 x 12 mm hypovascular area in the medial aspect of the spleen, similar to the prior examinations dating back to 02/20/2016, and incompletely characterized on today's examination, but presumably benign. Adrenals/Urinary Tract: 2 low-attenuation lesions in the right kidney which do not enhance, compatible with simple cysts, largest of which measures 1.7 x 1.5 cm in the medial aspect of the interpolar region. Several tiny 1-2 mm nonobstructive calculi are noted in the lower pole collecting systems of both kidneys. Left kidney is otherwise normal in appearance. Bilateral adrenal glands are normal in  appearance. No hydroureteronephrosis. 2 mm calculus in the dependent portion of the urinary bladder. Urinary bladder is otherwise normal in appearance. Stomach/Bowel: Normal appearance of the stomach. No pathologic dilatation of small bowel or colon. A few scattered colonic diverticulae are noted, without surrounding inflammatory changes to suggest an acute diverticulitis at this time. Normal appendix. Vascular/Lymphatic: Aortic atherosclerosis, without evidence of aneurysm or dissection in the abdominal or pelvic vasculature. Focal ectasia of the infrarenal abdominal aorta which measures up to 2.2 cm in diameter. IVC filter in position with tip terminating immediately below the level of the renal veins. Heterogeneously enhancing lymphadenopathy along the left pelvic sidewall, similar to the prior study, currently measuring 4.4 x 3.6 cm (axial image 60 of series 2), previously 4.7 x 3.9 cm on 09/28/2017. Previously noted right common iliac lymphadenopathy has increased compared to the prior study, currently measuring 3.1 x 1.9 cm (previously 1.9 x 1.5 cm on the prior examination when measured in a similar fashion on axial image 45 of series 2 of that study. 7 mm short axis precaval lymph node (axial image 38 of series 2) is unchanged. Reproductive: Status post hysterectomy. Ovaries are not confidently identified, likely surgically absent or atrophic. Other: No significant volume of ascites.  No pneumoperitoneum. Musculoskeletal: There are no aggressive appearing lytic or blastic lesions noted in the visualized portions of the skeleton. IMPRESSION: 1. Left pelvic sidewall mass and precaval lymph node appear centrally stable compared to the prior examination. However, there has been significant enlargement of a right common iliac nodal mass (3.1 x 1.9 cm on today's examination versus 1.9 x 1.5 cm on the prior study from 09/28/2017), concerning for progressive nodal metastatic disease. No new sites of metastatic  disease are noted elsewhere in the abdomen. 2. Multiple 1-2 mm nonobstructive calculi in the collecting systems of both kidneys. In addition, there is a 2 mm calculus in the lumen of the urinary bladder. No urinary calculi or hydroureteronephrosis noted at this time. 3. Mild colonic diverticulosis without evidence of acute diverticulitis at this time. 4. Aortic atherosclerosis, in addition to at least 2 vessel coronary artery disease. 5. There are calcifications of the aortic valve. Echocardiographic correlation for evaluation of potential valvular dysfunction may be warranted if clinically indicated. 6. Additional incidental findings, as above. Electronically Signed   By: Vinnie Langton M.D.   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  PLAN:    1. 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 Adjuvant treatment was recommended at that time, but patient refused on multiple occations and missed multiple follow-up appointments in the interim.  After agreeing to adjuvant chemotherapy, patient finally completed 6 cycles of carboplatinum and Taxol on April 21, 2017.  PET scan results from May 26, 2017 reviewed independently with interval decrease in size of left adnexal lesion.  There is  residual hypermetabolic activity possibly indicating residual disease therefore patient was given a referral to radiation oncology for local control.  Patient completed adjuvant XRT.  CT scan results from January 10, 2018 reviewed independently and report as above with a stable pelvic sidewall mass, but significant enlargement of the right common iliac node.  It is unclear if this was in the radiation field, therefore a referral was given back to radiation oncology for further evaluation.  If radiation can be done, patient will return to clinic 3 months after completion with  repeat imaging.  If no more XRT is possible patient will return to clinic that day to discuss treatment with possible letrozole or Megace.   2. Pain: Patient does not complain of this today.  Patient uses oxycodone sparingly and was given a refill today. 3. Hypokalemia: Resolved.  Continue oral potassium supplementation as prescribed. 4.  Lymphedema: Chronic and relatively unchanged.  5.  DVT: Diagnosed on October 12, 2017.  Continue Eliquis as prescribed.  Will consider discontinuing after 6 months of treatment.  6.  Anemia: Mild, monitor.  Patient expressed understanding and was in agreement with this plan. She also understands that She can call clinic at any time with any questions, concerns, or complaints.   Cancer Staging No matching staging information was found for the patient.   [name], MD   01/21/2018 11:47 AM

## 2018-01-17 ENCOUNTER — Other Ambulatory Visit: Payer: Self-pay | Admitting: *Deleted

## 2018-01-17 DIAGNOSIS — C541 Malignant neoplasm of endometrium: Secondary | ICD-10-CM

## 2018-01-17 NOTE — Progress Notes (Signed)
bc

## 2018-01-18 ENCOUNTER — Inpatient Hospital Stay (HOSPITAL_BASED_OUTPATIENT_CLINIC_OR_DEPARTMENT_OTHER): Payer: Medicare Other | Admitting: Oncology

## 2018-01-18 ENCOUNTER — Other Ambulatory Visit: Payer: Self-pay

## 2018-01-18 ENCOUNTER — Encounter: Payer: Self-pay | Admitting: Oncology

## 2018-01-18 ENCOUNTER — Other Ambulatory Visit: Payer: Self-pay | Admitting: *Deleted

## 2018-01-18 ENCOUNTER — Inpatient Hospital Stay: Payer: Medicare Other | Attending: Oncology

## 2018-01-18 VITALS — BP 123/82 | HR 65 | Temp 98.1°F | Resp 12 | Ht 61.0 in | Wt 135.7 lb

## 2018-01-18 DIAGNOSIS — I890 Lymphedema, not elsewhere classified: Secondary | ICD-10-CM | POA: Diagnosis not present

## 2018-01-18 DIAGNOSIS — R5383 Other fatigue: Secondary | ICD-10-CM

## 2018-01-18 DIAGNOSIS — D649 Anemia, unspecified: Secondary | ICD-10-CM | POA: Insufficient documentation

## 2018-01-18 DIAGNOSIS — R531 Weakness: Secondary | ICD-10-CM

## 2018-01-18 DIAGNOSIS — I82409 Acute embolism and thrombosis of unspecified deep veins of unspecified lower extremity: Secondary | ICD-10-CM | POA: Insufficient documentation

## 2018-01-18 DIAGNOSIS — Z87891 Personal history of nicotine dependence: Secondary | ICD-10-CM

## 2018-01-18 DIAGNOSIS — C541 Malignant neoplasm of endometrium: Secondary | ICD-10-CM

## 2018-01-18 DIAGNOSIS — R600 Localized edema: Secondary | ICD-10-CM

## 2018-01-18 LAB — CBC WITH DIFFERENTIAL/PLATELET
Abs Immature Granulocytes: 0.01 10*3/uL (ref 0.00–0.07)
Basophils Absolute: 0 10*3/uL (ref 0.0–0.1)
Basophils Relative: 1 %
EOS PCT: 3 %
Eosinophils Absolute: 0.1 10*3/uL (ref 0.0–0.5)
HEMATOCRIT: 35.5 % — AB (ref 36.0–46.0)
Hemoglobin: 11.2 g/dL — ABNORMAL LOW (ref 12.0–15.0)
Immature Granulocytes: 0 %
LYMPHS PCT: 34 %
Lymphs Abs: 1.6 10*3/uL (ref 0.7–4.0)
MCH: 28.8 pg (ref 26.0–34.0)
MCHC: 31.5 g/dL (ref 30.0–36.0)
MCV: 91.3 fL (ref 80.0–100.0)
Monocytes Absolute: 0.5 10*3/uL (ref 0.1–1.0)
Monocytes Relative: 10 %
NEUTROS ABS: 2.4 10*3/uL (ref 1.7–7.7)
NEUTROS PCT: 52 %
NRBC: 0 % (ref 0.0–0.2)
PLATELETS: 245 10*3/uL (ref 150–400)
RBC: 3.89 MIL/uL (ref 3.87–5.11)
RDW: 13.5 % (ref 11.5–15.5)
WBC: 4.7 10*3/uL (ref 4.0–10.5)

## 2018-01-18 LAB — COMPREHENSIVE METABOLIC PANEL
ALT: 10 U/L (ref 0–44)
AST: 18 U/L (ref 15–41)
Albumin: 4.1 g/dL (ref 3.5–5.0)
Alkaline Phosphatase: 98 U/L (ref 38–126)
Anion gap: 7 (ref 5–15)
BUN: 16 mg/dL (ref 8–23)
CHLORIDE: 100 mmol/L (ref 98–111)
CO2: 28 mmol/L (ref 22–32)
CREATININE: 0.79 mg/dL (ref 0.44–1.00)
Calcium: 9.2 mg/dL (ref 8.9–10.3)
Glucose, Bld: 84 mg/dL (ref 70–99)
POTASSIUM: 3.9 mmol/L (ref 3.5–5.1)
Sodium: 135 mmol/L (ref 135–145)
Total Bilirubin: 0.5 mg/dL (ref 0.3–1.2)
Total Protein: 7.7 g/dL (ref 6.5–8.1)

## 2018-01-18 MED ORDER — OXYCODONE HCL 10 MG PO TABS: 10.0000 mg | ORAL_TABLET | Freq: Every day | ORAL | 0 refills | Status: DC | PRN

## 2018-01-18 NOTE — Progress Notes (Signed)
Patient reports no changes since her last appointment.

## 2018-01-30 NOTE — Progress Notes (Signed)
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  INTERVAL HISTORY: Patient returns to clinic for further evaluation and treatment planning.  She was evaluated by radiation oncology earlier today with plans of doing 5 fractions of palliative XRT to residual disease.  She currently feels well and is asymptomatic.  Her weakness and fatigue have improved.  She continues to have bilateral lower extremity edema, left greater than right, but this has improved as well. She has no neurologic complaints. She denies any recent fevers or illnesses. She denies any chest pain or shortness of breath.  She denies any nausea, vomiting, constipation, or diarrhea.  She has no urinary complaints.  Patient is at her baseline offers no further specific complaints today.  REVIEW OF SYSTEMS:   Review of Systems  Constitutional: Negative.  Negative for fever, malaise/fatigue and weight loss.  Respiratory: Negative.  Negative for cough and shortness of breath.   Cardiovascular: Positive for leg swelling. Negative for chest pain.  Gastrointestinal: Negative.  Negative for abdominal pain, constipation, nausea and vomiting.  Genitourinary: Negative.  Negative for dysuria.  Musculoskeletal: Negative.  Negative for joint pain.  Skin: Negative.  Negative for rash.  Neurological: Negative.  Negative for sensory change, focal weakness, weakness and headaches.  Psychiatric/Behavioral: Negative.  The patient is not nervous/anxious.     As per HPI. Otherwise, a complete review of systems is negative.  PAST MEDICAL HISTORY: Past Medical History:  Diagnosis Date  . Arthritis   . CHF (congestive heart failure) ([place])   . Constipation   . DVT (deep venous  thrombosis) ([place]) 03/2017   left leg  . Endometrial cancer ([place]) 01/28/2016  . Hyperlipidemia   . Hypertension   . Hypothyroidism     PAST SURGICAL HISTORY: Past Surgical History:  Procedure Laterality Date  . CYSTOSCOPY W/ RETROGRADES Left 01/01/2017   Procedure: CYSTOSCOPY WITH RETROGRADE PYELOGRAM;  Surgeon: Nickie Retort, MD;  Location: [facility];  Service: Urology;  Laterality: Left;  . CYSTOSCOPY W/ RETROGRADES Left 04/23/2017   Procedure: CYSTOSCOPY WITH RETROGRADE PYELOGRAM;  Surgeon: Abbie Sons, MD;  Location: [facility];  Service: Urology;  Laterality: Left;  . CYSTOSCOPY W/ URETERAL STENT PLACEMENT Left 04/23/2017   Procedure: CYSTOSCOPY WITH STENT REMOVAL;  Surgeon: Abbie Sons, MD;  Location: [facility];  Service: Urology;  Laterality: Left;  . CYSTOSCOPY WITH STENT PLACEMENT Left 01/01/2017   Procedure: CYSTOSCOPY WITH STENT PLACEMENT;  Surgeon: Nickie Retort, MD;  Location: [facility];  Service: Urology;  Laterality: Left;  . DILATION AND CURETTAGE OF UTERUS    . HYSTEROSCOPY W/D&C N/A 01/28/2016   Procedure: DILATATION AND CURETTAGE /HYSTEROSCOPY;  Surgeon: [name], MD;  Location: [facility];  Service: Gynecology;  Laterality: N/A;  . LAPAROSCOPIC BILATERAL SALPINGO OOPHERECTOMY Bilateral 02/26/2016   Procedure: LAPAROSCOPIC BILATERAL SALPINGO OOPHORECTOMY;  Surgeon: [name], MD;  Location: [facility];  Service: Gynecology;  Laterality: Bilateral;  . LAPAROSCOPIC HYSTERECTOMY N/A 02/26/2016   Procedure: HYSTERECTOMY TOTAL LAPAROSCOPIC;  Surgeon: [name], MD;  Location: [facility];  Service: Gynecology;  Laterality: N/A;  . SENTINEL NODE BIOPSY N/A 02/26/2016   Procedure: SENTINEL NODE BIOPSY;  Surgeon: [name], MD;  Location: [facility];  Service: Gynecology;  Laterality: N/A;  . TONSILLECTOMY  FAMILY HISTORY: Family History  Problem Relation Age of Onset  . Diabetes Father   . Heart disease Father   . Hypertension Father   . Stroke  Father   . Diabetes Paternal Grandmother   . Heart disease Mother   . Hypertension Mother   . Stroke Mother   . Hypertension Sister   . Thyroid disease Sister   . Hypertension Brother     ADVANCED DIRECTIVES (Y/N):  N  HEALTH MAINTENANCE: Social History   Tobacco Use  . Smoking status: Former Research scientist (life sciences)  . Smokeless tobacco: Never Used  Substance Use Topics  . Alcohol use: No  . Drug use: No     Colonoscopy:  PAP:  Bone density:  Lipid panel:  Allergies  Allergen Reactions  . Sulfa Antibiotics Rash  . Zinc Rash    Current Outpatient Medications  Medication Sig Dispense Refill  . ELIQUIS 5 MG TABS tablet Take 2 tablets (10 mg total) by mouth 2 (two) times daily. 120 tablet 5  . furosemide (LASIX) 20 MG tablet Take 20 mg by mouth 2 (two) times daily.  0  . levothyroxine (SYNTHROID, LEVOTHROID) 150 MCG tablet TAKE 150 MCG BY MOUTH DAILY BEFORE BREAKFAST ON EMPTY STOMACH WITH A GLASS OF WATER 30-60MINS BEFORE BREAKFAST  2  . Oxycodone HCl 10 MG TABS Take 1 tablet (10 mg total) by mouth daily as needed. 30 tablet 0   No current facility-administered medications for this visit.      OBJECTIVE: Vitals:   01/31/18 1343  BP: 134/79  Resp: 18  Temp: 98.8 F (37.1 C)     Body mass index is 25.55 kg/m.    ECOG FS:1 - Symptomatic but completely ambulatory  General: Well-developed, well-nourished, no acute distress. Eyes: Pink conjunctiva, anicteric sclera. HEENT: Normocephalic, moist mucous membranes. Lungs: Clear to auscultation bilaterally. Heart: Regular rate and rhythm. No rubs, murmurs, or gallops. Abdomen: Soft, nontender, nondistended. No organomegaly noted, normoactive bowel sounds. Musculoskeletal: Bilateral lower extremity edema, left worse than right. Neuro: Alert, answering all questions appropriately. Cranial nerves grossly intact. Skin: No rashes or petechiae noted. Psych: Normal affect.  LAB RESULTS:  Lab Results  Component Value Date   NA 135  01/18/2018   K 3.9 01/18/2018   CL 100 01/18/2018   CO2 28 01/18/2018   GLUCOSE 84 01/18/2018   BUN 16 01/18/2018   CREATININE 0.79 01/18/2018   CALCIUM 9.2 01/18/2018   PROT 7.7 01/18/2018   ALBUMIN 4.1 01/18/2018   AST 18 01/18/2018   ALT 10 01/18/2018   ALKPHOS 98 01/18/2018   BILITOT 0.5 01/18/2018   GFRNONAA >60 01/18/2018   GFRAA >60 01/18/2018    Lab Results  Component Value Date   WBC 4.7 01/18/2018   NEUTROABS 2.4 01/18/2018   HGB 11.2 (L) 01/18/2018   HCT 35.5 (L) 01/18/2018   MCV 91.3 01/18/2018   PLT 245 01/18/2018     STUDIES: Ct Abdomen Pelvis W Contrast  Result Date: 01/10/2018 CLINICAL DATA:  84 year old female with history of endometrial cancer. Status post hysterectomy, radiation therapy and chemotherapy. Restaging examination. EXAM: CT ABDOMEN AND PELVIS WITH CONTRAST TECHNIQUE: Multidetector CT imaging of the abdomen and pelvis was performed using the standard protocol following bolus administration of intravenous contrast. CONTRAST:  <See Chart> ISOVUE-300 IOPAMIDOL (ISOVUE-300) INJECTION 61%, 51mL OMNIPAQUE IOHEXOL 300 MG/ML SOLN COMPARISON:  CT the abdomen and pelvis 09/28/2017. FINDINGS: Lower chest: Scarring in the lung bases bilaterally. Hepatobiliary: No suspicious cystic or solid hepatic lesions. No intra or extrahepatic  biliary ductal dilatation. Gallbladder is normal in appearance. Pancreas: No pancreatic mass. No pancreatic ductal dilatation. No pancreatic or peripancreatic fluid or inflammatory changes. Spleen: 10 x 12 mm hypovascular area in the medial aspect of the spleen, similar to the prior examinations dating back to 02/20/2016, and incompletely characterized on today's examination, but presumably benign. Adrenals/Urinary Tract: 2 low-attenuation lesions in the right kidney which do not enhance, compatible with simple cysts, largest of which measures 1.7 x 1.5 cm in the medial aspect of the interpolar region. Several tiny 1-2 mm nonobstructive  calculi are noted in the lower pole collecting systems of both kidneys. Left kidney is otherwise normal in appearance. Bilateral adrenal glands are normal in appearance. No hydroureteronephrosis. 2 mm calculus in the dependent portion of the urinary bladder. Urinary bladder is otherwise normal in appearance. Stomach/Bowel: Normal appearance of the stomach. No pathologic dilatation of small bowel or colon. A few scattered colonic diverticulae are noted, without surrounding inflammatory changes to suggest an acute diverticulitis at this time. Normal appendix. Vascular/Lymphatic: Aortic atherosclerosis, without evidence of aneurysm or dissection in the abdominal or pelvic vasculature. Focal ectasia of the infrarenal abdominal aorta which measures up to 2.2 cm in diameter. IVC filter in position with tip terminating immediately below the level of the renal veins. Heterogeneously enhancing lymphadenopathy along the left pelvic sidewall, similar to the prior study, currently measuring 4.4 x 3.6 cm (axial image 60 of series 2), previously 4.7 x 3.9 cm on 09/28/2017. Previously noted right common iliac lymphadenopathy has increased compared to the prior study, currently measuring 3.1 x 1.9 cm (previously 1.9 x 1.5 cm on the prior examination when measured in a similar fashion on axial image 45 of series 2 of that study. 7 mm short axis precaval lymph node (axial image 38 of series 2) is unchanged. Reproductive: Status post hysterectomy. Ovaries are not confidently identified, likely surgically absent or atrophic. Other: No significant volume of ascites.  No pneumoperitoneum. Musculoskeletal: There are no aggressive appearing lytic or blastic lesions noted in the visualized portions of the skeleton. IMPRESSION: 1. Left pelvic sidewall mass and precaval lymph node appear centrally stable compared to the prior examination. However, there has been significant enlargement of a right common iliac nodal mass (3.1 x 1.9 cm on  today's examination versus 1.9 x 1.5 cm on the prior study from 09/28/2017), concerning for progressive nodal metastatic disease. No new sites of metastatic disease are noted elsewhere in the abdomen. 2. Multiple 1-2 mm nonobstructive calculi in the collecting systems of both kidneys. In addition, there is a 2 mm calculus in the lumen of the urinary bladder. No urinary calculi or hydroureteronephrosis noted at this time. 3. Mild colonic diverticulosis without evidence of acute diverticulitis at this time. 4. Aortic atherosclerosis, in addition to at least 2 vessel coronary artery disease. 5. There are calcifications of the aortic valve. Echocardiographic correlation for evaluation of potential valvular dysfunction may be warranted if clinically indicated. 6. Additional incidental findings, as above. Electronically Signed   By: Vinnie Langton M.D.   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  PLAN:    1. 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 Adjuvant treatment was recommended at that time, but patient refused on multiple occations and missed multiple follow-up appointments in the interim.  After agreeing to adjuvant chemotherapy, patient finally completed 6 cycles of carboplatinum and Taxol on April 21, 2017.  PET scan results from May 26, 2017 reviewed independently with interval decrease in size of left adnexal lesion.  There is residual hypermetabolic activity possibly indicating residual disease therefore patient was given a referral to radiation oncology for local control.  Patient completed adjuvant XRT.  CT scan results from January 10, 2018 reviewed independently and report as above with a stable pelvic sidewall mass, but significant enlargement of the right common iliac node.  Patient had consultation with radiation oncology who plans to do  5 additional fractions to her residual disease.  Given the fact the patient has recurrent disease, can consider maintenance letrozole in the future.  Patient has refused treatment with Megace.  Return to clinic in 2 months after the holidays for further evaluation and consideration of initiating treatment.  2. Pain: Patient does not complain of this today.  Patient uses oxycodone sparingly. 3. Hypokalemia: Resolved.  Continue oral potassium supplementation as prescribed. 4.  Lymphedema: Chronic and relatively unchanged.  5.  DVT: Diagnosed on October 12, 2017.  Continue Eliquis as prescribed.  Given patient's altered anatomy from her malignancy as well as scarring from XRT, will continue on extended Eliquis.   6.  Anemia: Mild, monitor.  I spent a total of 30 minutes face-to-face with the patient of which greater than 50% of the visit was spent in counseling and coordination of care as detailed above.   Patient expressed understanding and was in agreement with this plan. She also understands that She can call clinic at any time with any questions, concerns, or complaints.   Cancer Staging No matching staging information was found for the patient.   [name], MD   02/01/2018 10:35 AM

## 2018-01-31 ENCOUNTER — Other Ambulatory Visit: Payer: Self-pay | Admitting: *Deleted

## 2018-01-31 ENCOUNTER — Encounter: Payer: Self-pay | Admitting: Oncology

## 2018-01-31 ENCOUNTER — Other Ambulatory Visit: Payer: Self-pay

## 2018-01-31 ENCOUNTER — Ambulatory Visit
Admission: RE | Admit: 2018-01-31 | Discharge: 2018-01-31 | Disposition: A | Discharge status: Home / Self Care | Payer: Medicare Other | Source: Ambulatory Visit | Attending: Radiation Oncology | Admitting: Radiation Oncology

## 2018-01-31 ENCOUNTER — Encounter: Payer: Self-pay | Admitting: Radiation Oncology

## 2018-01-31 ENCOUNTER — Inpatient Hospital Stay: Payer: Medicare Other | Attending: Oncology | Admitting: Oncology

## 2018-01-31 VITALS — BP 133/82 | HR 74 | Temp 99.0°F | Resp 16 | Wt 135.1 lb

## 2018-01-31 VITALS — BP 134/79 | Temp 98.8°F | Resp 18 | Wt 135.2 lb

## 2018-01-31 DIAGNOSIS — C778 Secondary and unspecified malignant neoplasm of lymph nodes of multiple regions: Secondary | ICD-10-CM | POA: Diagnosis not present

## 2018-01-31 DIAGNOSIS — R269 Unspecified abnormalities of gait and mobility: Secondary | ICD-10-CM | POA: Diagnosis not present

## 2018-01-31 DIAGNOSIS — C541 Malignant neoplasm of endometrium: Secondary | ICD-10-CM | POA: Insufficient documentation

## 2018-01-31 DIAGNOSIS — Z7901 Long term (current) use of anticoagulants: Secondary | ICD-10-CM

## 2018-01-31 DIAGNOSIS — Z79899 Other long term (current) drug therapy: Secondary | ICD-10-CM | POA: Insufficient documentation

## 2018-01-31 DIAGNOSIS — D649 Anemia, unspecified: Secondary | ICD-10-CM | POA: Diagnosis not present

## 2018-01-31 DIAGNOSIS — Z923 Personal history of irradiation: Secondary | ICD-10-CM | POA: Insufficient documentation

## 2018-01-31 DIAGNOSIS — Z87891 Personal history of nicotine dependence: Secondary | ICD-10-CM | POA: Diagnosis not present

## 2018-01-31 DIAGNOSIS — I82409 Acute embolism and thrombosis of unspecified deep veins of unspecified lower extremity: Secondary | ICD-10-CM

## 2018-01-31 DIAGNOSIS — Z9221 Personal history of antineoplastic chemotherapy: Secondary | ICD-10-CM | POA: Diagnosis not present

## 2018-01-31 DIAGNOSIS — Z90722 Acquired absence of ovaries, bilateral: Secondary | ICD-10-CM | POA: Insufficient documentation

## 2018-01-31 DIAGNOSIS — I890 Lymphedema, not elsewhere classified: Secondary | ICD-10-CM | POA: Insufficient documentation

## 2018-01-31 DIAGNOSIS — Z90710 Acquired absence of both cervix and uterus: Secondary | ICD-10-CM | POA: Diagnosis not present

## 2018-01-31 NOTE — Progress Notes (Signed)
Radiation Oncology Follow up Note  Name: Vickie Barton   Date:   01/31/2018 MRN:  [id number] DOB: [birth year]    This 84 y.o. female presents to the clinic today for reevaluation of patient was completed concurrent chemotherapy external beam radiation therapy and vaginal trachea therapy for stage III a high-grade serouscarcinoma of the endometrium.  REFERRING PROVIDER: [name], MD  HPI: patient is an 84 year old female now out 4 months having completed both external beam radiation therapy as well as vaginal brachytherapy for stage IIIa high-grade serous endometrial carcinoma..she had originally underwent TAH/BSO in 2017 refused adjuvant treatment. She then presented with abdominal pain and a large pelvic mass with hypermetabolic activity on PET scanconcerning for recurrent disease. She received 6 cycles of carboplatin and Taxol with still residual hypermetabolic activity in the periphery of the large pelvic lesion. She has been doing fairly well although recent CT scan performed by Dr. Grayland Ormond shows stable left pelvic sidewall mass and precaval lymph nodes however there been significant enlargement of her right common iliac nodal mass measuring at least 3 cm versus 2 cm on the prior study in July. I been asked to reevaluate the patient for possible further radiation therapy to this region. She is doing fairly well although has bursitis walks with a limp. She is specifically denying abdominal pain diarrhea or dysuria today.  COMPLICATIONS OF TREATMENT: none  FOLLOW UP COMPLIANCE: keeps appointments   PHYSICAL EXAM:  BP 133/82 (BP Location: Left Arm, Patient Position: Sitting)   Pulse 74   Temp 99 F (37.2 C) (Tympanic)   Resp 16   Wt 135 lb 2.3 oz (61.3 kg)   BMI 25.53 kg/m  Well-developed well-nourished patient in NAD. HEENT reveals PERLA, EOMI, discs not visualized.  Oral cavity is clear. No oral mucosal lesions are identified. Neck is clear without evidence of cervical or  supraclavicular adenopathy. Lungs are clear to A&P. Cardiac examination is essentially unremarkable with regular rate and rhythm without murmur rub or thrill. Abdomen is benign with no organomegaly or masses noted. Motor sensory and DTR levels are equal and symmetric in the upper and lower extremities. Cranial nerves II through XII are grossly intact. Proprioception is intact. No peripheral adenopathy or edema is identified. No motor or sensory levels are noted. Crude visual fields are within normal range.  RADIOLOGY RESULTS: CT scan is reviewed compatible with the above-stated findings showing enlarging common iliac node  PLAN: at this time of ordered a PET CT scan. I like to discern whether we are dealing with a local regional progression or there is other disease more distantly. I certainly could treat this lymph node because I believe it's borderline in my prior involved field with stereotactic body radiation therapy. I believe I can treat this to 3000 cGy in 5 fractions.PET CT will give me better determination of extent of disease at this time and help with my treatment planning. I have set a PET CT and a follow-up one with the patient to discuss results Patient also continues continues close follow-up care with Dr. Grayland Ormond.  I would like to take this opportunity to thank you for allowing me to participate in the care of your patient.Noreene Filbert, MD

## 2018-01-31 NOTE — Progress Notes (Signed)
Patient here for follow up. No concerns voiced.  °

## 2018-02-04 ENCOUNTER — Encounter
Admission: RE | Admit: 2018-02-04 | Discharge: 2018-02-04 | Disposition: A | Discharge status: Home / Self Care | Payer: Medicare Other | Source: Ambulatory Visit | Attending: Radiation Oncology | Admitting: Radiation Oncology

## 2018-02-04 DIAGNOSIS — C541 Malignant neoplasm of endometrium: Secondary | ICD-10-CM

## 2018-02-04 LAB — GLUCOSE, CAPILLARY: Glucose-Capillary: 83 mg/dL (ref 70–99)

## 2018-02-04 MED ORDER — FLUDEOXYGLUCOSE F - 18 (FDG) INJECTION
7.7000 | Freq: Once | INTRAVENOUS | Status: AC | PRN
  Administered 2018-02-04: 7.61 via INTRAVENOUS

## 2018-02-07 ENCOUNTER — Encounter: Payer: Self-pay | Admitting: Radiation Oncology

## 2018-02-07 ENCOUNTER — Other Ambulatory Visit: Payer: Self-pay

## 2018-02-07 ENCOUNTER — Ambulatory Visit
Admission: RE | Admit: 2018-02-07 | Discharge: 2018-02-07 | Disposition: A | Discharge status: Home / Self Care | Payer: Medicare Other | Source: Ambulatory Visit | Attending: Radiation Oncology | Admitting: Radiation Oncology

## 2018-02-07 VITALS — BP 114/70 | HR 70 | Temp 98.1°F | Resp 12

## 2018-02-07 DIAGNOSIS — Z923 Personal history of irradiation: Secondary | ICD-10-CM | POA: Insufficient documentation

## 2018-02-07 DIAGNOSIS — Z9221 Personal history of antineoplastic chemotherapy: Secondary | ICD-10-CM | POA: Diagnosis not present

## 2018-02-07 DIAGNOSIS — C541 Malignant neoplasm of endometrium: Secondary | ICD-10-CM | POA: Diagnosis not present

## 2018-02-07 DIAGNOSIS — J358 Other chronic diseases of tonsils and adenoids: Secondary | ICD-10-CM | POA: Insufficient documentation

## 2018-02-07 DIAGNOSIS — C778 Secondary and unspecified malignant neoplasm of lymph nodes of multiple regions: Secondary | ICD-10-CM | POA: Insufficient documentation

## 2018-02-07 NOTE — Progress Notes (Signed)
Radiation Oncology Follow up Note  Name: Vickie Barton   Date:   02/07/2018 MRN:  [id number] DOB: [birth year]    This 84 y.o. female presents to the clinic today for follow-up for PET CT scan for reevaluation of high-grade serous carcinoma the endometrium status post concurrent chemotherapy and external beam radiation.  REFERRING PROVIDER: [name], MD  HPI: patient is a 84 year old female who completed concurrent chemoradiation with excellent response for stage IIIa high-grade serous and mutual carcinoma. She received 6 cycles of carboplatin and Taxolwith still residual hypermetabolic activity in the periphery the large pelvic lesion which reradiated. Recent repeat scan showed stable left pelvic sidewall mass and pre-capsule lymph nodes but there issignificant enlargement of a high right common iliac node. I performed a PET CT scan.showing hypermetabolic activity associated with a large retroperitoneal and bilateral common iliac nodes. Incidentally there was some intake in the right tonsillar region not thought to be clinically significant.  COMPLICATIONS OF TREATMENT: none  FOLLOW UP COMPLIANCE: keeps appointments   PHYSICAL EXAM:  BP 114/70   Pulse 70   Temp 98.1 F (36.7 C) (Tympanic)   Resp 12   Ht (P) 5' 1.5" (1.562 m)   Wt (P) 135 lb 12.9 oz (61.6 kg)   BMI (P) 25.24 kg/m  Well-developed well-nourished patient in NAD. HEENT reveals PERLA, EOMI, discs not visualized.  Oral cavity is clear. No oral mucosal lesions are identified. Neck is clear without evidence of cervical or supraclavicular adenopathy. Lungs are clear to A&P. Cardiac examination is essentially unremarkable with regular rate and rhythm without murmur rub or thrill. Abdomen is benign with no organomegaly or masses noted. Motor sensory and DTR levels are equal and symmetric in the upper and lower extremities. Cranial nerves II through XII are grossly intact. Proprioception is intact. No peripheral adenopathy or  edema is identified. No motor or sensory levels are noted. Crude visual fields are within normal range.  RADIOLOGY RESULTS: PET CT scan reviewed and compatible with the above-stated findings  PLAN: at this time I believe I still can go ahead with SB RT treatment 3000 cGy in 5 fractions to these areas of hypermetabolic activity. I believe using this technique will spare any significant symptoms such as diarrhea injury to her small bowel. Risks and benefits of treatment including possibility of diarrhea fatigue alteration of blood counts were discussed with the patient. I have personally set up and ordered CT simulation for later this week. Patient and daughter both compress my treatment plan well.  I would like to take this opportunity to thank you for allowing me to participate in the care of your patient.Noreene Filbert, MD

## 2018-02-09 ENCOUNTER — Ambulatory Visit
Admission: RE | Admit: 2018-02-09 | Discharge: 2018-02-09 | Disposition: A | Discharge status: Home / Self Care | Payer: Medicare Other | Source: Ambulatory Visit | Attending: Radiation Oncology | Admitting: Radiation Oncology

## 2018-02-09 DIAGNOSIS — C541 Malignant neoplasm of endometrium: Secondary | ICD-10-CM | POA: Diagnosis not present

## 2018-02-09 DIAGNOSIS — C778 Secondary and unspecified malignant neoplasm of lymph nodes of multiple regions: Secondary | ICD-10-CM | POA: Diagnosis not present

## 2018-02-10 DIAGNOSIS — C541 Malignant neoplasm of endometrium: Secondary | ICD-10-CM | POA: Diagnosis not present

## 2018-02-10 DIAGNOSIS — C778 Secondary and unspecified malignant neoplasm of lymph nodes of multiple regions: Secondary | ICD-10-CM | POA: Diagnosis not present

## 2018-02-21 ENCOUNTER — Other Ambulatory Visit: Payer: Self-pay | Admitting: *Deleted

## 2018-02-21 MED ORDER — OXYCODONE HCL 10 MG PO TABS: 10.0000 mg | ORAL_TABLET | Freq: Every day | ORAL | 0 refills | Status: DC | PRN

## 2018-02-21 NOTE — Telephone Encounter (Signed)
Patient called requesting a refill of her oxycodone 10 mg to [facility] [address].    dhs

## 2018-02-22 ENCOUNTER — Ambulatory Visit
Admission: RE | Admit: 2018-02-22 | Discharge: 2018-02-22 | Disposition: A | Discharge status: Home / Self Care | Payer: Medicare Other | Source: Ambulatory Visit | Attending: Radiation Oncology | Admitting: Radiation Oncology

## 2018-02-22 DIAGNOSIS — C541 Malignant neoplasm of endometrium: Secondary | ICD-10-CM | POA: Insufficient documentation

## 2018-02-22 DIAGNOSIS — C778 Secondary and unspecified malignant neoplasm of lymph nodes of multiple regions: Secondary | ICD-10-CM | POA: Diagnosis not present

## 2018-02-23 ENCOUNTER — Ambulatory Visit: Payer: Medicare Other | Admitting: Radiation Oncology

## 2018-02-24 ENCOUNTER — Ambulatory Visit
Admission: RE | Admit: 2018-02-24 | Discharge: 2018-02-24 | Disposition: A | Discharge status: Home / Self Care | Payer: Medicare Other | Source: Ambulatory Visit | Attending: Radiation Oncology | Admitting: Radiation Oncology

## 2018-02-24 DIAGNOSIS — C778 Secondary and unspecified malignant neoplasm of lymph nodes of multiple regions: Secondary | ICD-10-CM | POA: Diagnosis not present

## 2018-02-24 DIAGNOSIS — C541 Malignant neoplasm of endometrium: Secondary | ICD-10-CM | POA: Diagnosis not present

## 2018-03-01 ENCOUNTER — Ambulatory Visit: Payer: Medicare Other

## 2018-03-01 ENCOUNTER — Ambulatory Visit: Admission: RE | Admit: 2018-03-01 | Payer: Medicare Other | Source: Ambulatory Visit

## 2018-03-02 ENCOUNTER — Ambulatory Visit
Admission: RE | Admit: 2018-03-02 | Discharge: 2018-03-02 | Disposition: A | Discharge status: Home / Self Care | Payer: Medicare Other | Source: Ambulatory Visit | Attending: Radiation Oncology | Admitting: Radiation Oncology

## 2018-03-02 DIAGNOSIS — C778 Secondary and unspecified malignant neoplasm of lymph nodes of multiple regions: Secondary | ICD-10-CM | POA: Diagnosis not present

## 2018-03-02 DIAGNOSIS — C541 Malignant neoplasm of endometrium: Secondary | ICD-10-CM | POA: Diagnosis not present

## 2018-03-03 ENCOUNTER — Ambulatory Visit
Admission: RE | Admit: 2018-03-03 | Discharge: 2018-03-03 | Disposition: A | Discharge status: Home / Self Care | Payer: Medicare Other | Source: Ambulatory Visit | Attending: Radiation Oncology | Admitting: Radiation Oncology

## 2018-03-03 DIAGNOSIS — C541 Malignant neoplasm of endometrium: Secondary | ICD-10-CM | POA: Diagnosis not present

## 2018-03-03 DIAGNOSIS — C778 Secondary and unspecified malignant neoplasm of lymph nodes of multiple regions: Secondary | ICD-10-CM | POA: Diagnosis not present

## 2018-03-08 ENCOUNTER — Ambulatory Visit
Admission: RE | Admit: 2018-03-08 | Discharge: 2018-03-08 | Disposition: A | Discharge status: Home / Self Care | Payer: Medicare Other | Source: Ambulatory Visit | Attending: Radiation Oncology | Admitting: Radiation Oncology

## 2018-03-08 DIAGNOSIS — C778 Secondary and unspecified malignant neoplasm of lymph nodes of multiple regions: Secondary | ICD-10-CM | POA: Diagnosis not present

## 2018-03-08 DIAGNOSIS — C541 Malignant neoplasm of endometrium: Secondary | ICD-10-CM | POA: Diagnosis not present

## 2018-03-24 ENCOUNTER — Other Ambulatory Visit: Payer: Self-pay | Admitting: *Deleted

## 2018-03-24 MED ORDER — OXYCODONE HCL 10 MG PO TABS: 10.0000 mg | ORAL_TABLET | Freq: Every day | ORAL | 0 refills | Status: DC | PRN

## 2018-04-01 NOTE — Progress Notes (Signed)
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  INTERVAL HISTORY: Patient returns to clinic today for further evaluation.  She completed XRT for residual disease approximately 1 month ago.  She currently feels well and is asymptomatic.  She does not complain of pain today.  She denies any weakness or fatigue.  She continues to have bilateral lower extremity edema, left greater than right, which is chronic and unchanged. She has no neurologic complaints. She denies any recent fevers or illnesses. She denies any chest pain or shortness of breath.  She denies any nausea, vomiting, constipation, or diarrhea.  She has no urinary complaints.  Patient feels at her baseline and offers no specific complaints today.  REVIEW OF SYSTEMS:   Review of Systems  Constitutional: Negative.  Negative for fever, malaise/fatigue and weight loss.  Respiratory: Negative.  Negative for cough and shortness of breath.   Cardiovascular: Positive for leg swelling. Negative for chest pain.  Gastrointestinal: Negative.  Negative for abdominal pain, constipation, nausea and vomiting.  Genitourinary: Negative.  Negative for dysuria.  Musculoskeletal: Negative.  Negative for joint pain.  Skin: Negative.  Negative for rash.  Neurological: Negative.  Negative for sensory change, focal weakness, weakness and headaches.  Psychiatric/Behavioral: Negative.  The patient is not nervous/anxious.     As per HPI. Otherwise, a complete review of systems is negative.  PAST MEDICAL HISTORY: Past Medical History:  Diagnosis Date  . Arthritis   . CHF (congestive heart failure) ([place])   . Constipation   . DVT (deep venous thrombosis) ([place]) 03/2017   left leg  .  Endometrial cancer ([place]) 01/28/2016  . Hyperlipidemia   . Hypertension   . Hypothyroidism     PAST SURGICAL HISTORY: Past Surgical History:  Procedure Laterality Date  . CYSTOSCOPY W/ RETROGRADES Left 01/01/2017   Procedure: CYSTOSCOPY WITH RETROGRADE PYELOGRAM;  Surgeon: Nickie Retort, MD;  Location: [facility];  Service: Urology;  Laterality: Left;  . CYSTOSCOPY W/ RETROGRADES Left 04/23/2017   Procedure: CYSTOSCOPY WITH RETROGRADE PYELOGRAM;  Surgeon: Abbie Sons, MD;  Location: [facility];  Service: Urology;  Laterality: Left;  . CYSTOSCOPY W/ URETERAL STENT PLACEMENT Left 04/23/2017   Procedure: CYSTOSCOPY WITH STENT REMOVAL;  Surgeon: Abbie Sons, MD;  Location: [facility];  Service: Urology;  Laterality: Left;  . CYSTOSCOPY WITH STENT PLACEMENT Left 01/01/2017   Procedure: CYSTOSCOPY WITH STENT PLACEMENT;  Surgeon: Nickie Retort, MD;  Location: [facility];  Service: Urology;  Laterality: Left;  . DILATION AND CURETTAGE OF UTERUS    . HYSTEROSCOPY W/D&C N/A 01/28/2016   Procedure: DILATATION AND CURETTAGE /HYSTEROSCOPY;  Surgeon: [name], MD;  Location: [facility];  Service: Gynecology;  Laterality: N/A;  . LAPAROSCOPIC BILATERAL SALPINGO OOPHERECTOMY Bilateral 02/26/2016   Procedure: LAPAROSCOPIC BILATERAL SALPINGO OOPHORECTOMY;  Surgeon: [name], MD;  Location: [facility];  Service: Gynecology;  Laterality: Bilateral;  . LAPAROSCOPIC HYSTERECTOMY N/A 02/26/2016   Procedure: HYSTERECTOMY TOTAL LAPAROSCOPIC;  Surgeon: [name], MD;  Location: [facility];  Service: Gynecology;  Laterality: N/A;  . SENTINEL NODE BIOPSY N/A 02/26/2016   Procedure: SENTINEL NODE BIOPSY;  Surgeon: [name], MD;  Location: [facility];  Service: Gynecology;  Laterality: N/A;  . TONSILLECTOMY      FAMILY HISTORY: Family  History  Problem Relation Age of Onset  . Diabetes Father   . Heart disease Father   . Hypertension Father   . Stroke Father   . Diabetes Paternal Grandmother    . Heart disease Mother   . Hypertension Mother   . Stroke Mother   . Hypertension Sister   . Thyroid disease Sister   . Hypertension Brother     ADVANCED DIRECTIVES (Y/N):  N  HEALTH MAINTENANCE: Social History   Tobacco Use  . Smoking status: Former Research scientist (life sciences)  . Smokeless tobacco: Never Used  Substance Use Topics  . Alcohol use: No  . Drug use: No     Colonoscopy:  PAP:  Bone density:  Lipid panel:  Allergies  Allergen Reactions  . Sulfa Antibiotics Rash  . Zinc Rash    Current Outpatient Medications  Medication Sig Dispense Refill  . ELIQUIS 5 MG TABS tablet Take 2 tablets (10 mg total) by mouth 2 (two) times daily. 120 tablet 5  . furosemide (LASIX) 20 MG tablet Take 20 mg by mouth 2 (two) times daily.  0  . levothyroxine (SYNTHROID, LEVOTHROID) 150 MCG tablet TAKE 150 MCG BY MOUTH DAILY BEFORE BREAKFAST ON EMPTY STOMACH WITH A GLASS OF WATER 30-60MINS BEFORE BREAKFAST  2  . Oxycodone HCl 10 MG TABS Take 1 tablet (10 mg total) by mouth daily as needed. 30 tablet 0  . letrozole (FEMARA) 2.5 MG tablet Take 1 tablet (2.5 mg total) by mouth daily. 30 tablet 3   No current facility-administered medications for this visit.      OBJECTIVE: Vitals:   04/04/18 1433  BP: (!) 160/79  Pulse: 78  Temp: 97.9 F (36.6 C)     Body mass index is 25.85 kg/m.    ECOG FS:1 - Symptomatic but completely ambulatory  General: Well-developed, well-nourished, no acute distress. Eyes: Pink conjunctiva, anicteric sclera. HEENT: Normocephalic, moist mucous membranes. Lungs: Clear to auscultation bilaterally. Heart: Regular rate and rhythm. No rubs, murmurs, or gallops. Abdomen: Soft, nontender, nondistended. No organomegaly noted, normoactive bowel sounds. Musculoskeletal: Bilateral lower extremity lymphedema.  Left greater than right.   Neuro: Alert, answering all questions appropriately. Cranial nerves grossly intact. Skin: No rashes or petechiae noted. Psych: Normal  affect.  LAB RESULTS:  Lab Results  Component Value Date   NA 135 01/18/2018   K 3.9 01/18/2018   CL 100 01/18/2018   CO2 28 01/18/2018   GLUCOSE 84 01/18/2018   BUN 16 01/18/2018   CREATININE 0.79 01/18/2018   CALCIUM 9.2 01/18/2018   PROT 7.7 01/18/2018   ALBUMIN 4.1 01/18/2018   AST 18 01/18/2018   ALT 10 01/18/2018   ALKPHOS 98 01/18/2018   BILITOT 0.5 01/18/2018   GFRNONAA >60 01/18/2018   GFRAA >60 01/18/2018    Lab Results  Component Value Date   WBC 4.7 01/18/2018   NEUTROABS 2.4 01/18/2018   HGB 11.2 (L) 01/18/2018   HCT 35.5 (L) 01/18/2018   MCV 91.3 01/18/2018   PLT 245 01/18/2018     STUDIES: No results found.  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  PLAN:    1. 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 Adjuvant treatment was recommended at that time, but patient refused on multiple occations and missed multiple follow-up appointments in the interim.  After agreeing to adjuvant chemotherapy, patient finally completed 6 cycles of carboplatinum and Taxol on April 21, 2017.  PET scan results  from May 26, 2017 reviewed independently with interval decrease in size of left adnexal lesion.  There is residual hypermetabolic activity possibly indicating residual disease therefore patient was given a referral to radiation oncology for local control.  Patient completed adjuvant XRT.  CT scan results from January 10, 2018 reviewed independently with a stable pelvic sidewall mass, but significant enlargement of the right common iliac node.  She completed a second round of XRT to this area completing on March 08, 2018.  Patient has agreed to initiate maintenance letrozole.  She will require a baseline bone mineral density in the next 1 to 2 weeks.  Repeat PET scan in mid March 2020 and follow-up 1 to 2 days later.   2. Pain: Patient does  not complain of this today.  Patient uses oxycodone sparingly. 3. Hypokalemia: Resolved.  Continue oral potassium supplementation as prescribed. 4.  Lymphedema: Chronic and relatively unchanged.  5.  DVT: Diagnosed on October 12, 2017.  Continue Eliquis as prescribed.  Given patient's altered anatomy from her malignancy as well as scarring from XRT, will continue on extended Eliquis.   6.  Anemia: Chronic and unchanged.   Patient expressed understanding and was in agreement with this plan. She also understands that She can call clinic at any time with any questions, concerns, or complaints.   Cancer Staging No matching staging information was found for the patient.   [name], MD   04/05/2018 6:43 AM

## 2018-04-04 ENCOUNTER — Other Ambulatory Visit: Payer: Self-pay

## 2018-04-04 ENCOUNTER — Inpatient Hospital Stay: Payer: Medicare Other | Attending: Oncology | Admitting: Oncology

## 2018-04-04 ENCOUNTER — Telehealth: Payer: Self-pay

## 2018-04-04 ENCOUNTER — Encounter (INDEPENDENT_AMBULATORY_CARE_PROVIDER_SITE_OTHER): Payer: Self-pay

## 2018-04-04 VITALS — BP 160/79 | HR 78 | Temp 97.9°F | Ht 61.0 in | Wt 136.8 lb

## 2018-04-04 DIAGNOSIS — C541 Malignant neoplasm of endometrium: Secondary | ICD-10-CM | POA: Insufficient documentation

## 2018-04-04 DIAGNOSIS — I890 Lymphedema, not elsewhere classified: Secondary | ICD-10-CM | POA: Diagnosis not present

## 2018-04-04 DIAGNOSIS — Z7901 Long term (current) use of anticoagulants: Secondary | ICD-10-CM | POA: Insufficient documentation

## 2018-04-04 DIAGNOSIS — Z87891 Personal history of nicotine dependence: Secondary | ICD-10-CM

## 2018-04-04 DIAGNOSIS — I82409 Acute embolism and thrombosis of unspecified deep veins of unspecified lower extremity: Secondary | ICD-10-CM | POA: Insufficient documentation

## 2018-04-04 DIAGNOSIS — D649 Anemia, unspecified: Secondary | ICD-10-CM | POA: Insufficient documentation

## 2018-04-04 MED ORDER — LETROZOLE 2.5 MG PO TABS: 2.5000 mg | ORAL_TABLET | Freq: Every day | ORAL | 3 refills | Status: DC

## 2018-04-04 NOTE — Progress Notes (Signed)
Patient is here today to follow up on her endometrial cancer. Patient stated that she had been doing well. Patient stated that she finished radiation in March 08, 2018.

## 2018-04-04 NOTE — Telephone Encounter (Signed)
Patient called stating that she is not able to come in on the appointments that had been scheduled. Patient stated that she could come on the beginning of April for her PET and follow up appointment with Dr. Grayland Ormond. I told patient that we would reschedule it for her and let her know of her new dates. Patient understood and had no further questions.

## 2018-04-05 NOTE — Telephone Encounter (Signed)
Vickie Barton was able to reschedule patient's appointments to 06/23/2018 for PET Scan and follow up with Dr. Grayland Ormond.

## 2018-04-13 ENCOUNTER — Other Ambulatory Visit: Payer: Self-pay

## 2018-04-13 ENCOUNTER — Ambulatory Visit
Admission: RE | Admit: 2018-04-13 | Discharge: 2018-04-13 | Disposition: A | Discharge status: Home / Self Care | Payer: Medicare Other | Source: Ambulatory Visit | Attending: Radiation Oncology | Admitting: Radiation Oncology

## 2018-04-13 ENCOUNTER — Encounter: Payer: Self-pay | Admitting: Radiation Oncology

## 2018-04-13 VITALS — BP 143/87 | HR 91 | Temp 97.2°F | Wt 133.9 lb

## 2018-04-13 DIAGNOSIS — C541 Malignant neoplasm of endometrium: Secondary | ICD-10-CM

## 2018-04-13 DIAGNOSIS — Z923 Personal history of irradiation: Secondary | ICD-10-CM | POA: Insufficient documentation

## 2018-04-13 DIAGNOSIS — K5900 Constipation, unspecified: Secondary | ICD-10-CM | POA: Diagnosis not present

## 2018-04-13 DIAGNOSIS — Z79811 Long term (current) use of aromatase inhibitors: Secondary | ICD-10-CM | POA: Insufficient documentation

## 2018-04-13 DIAGNOSIS — C778 Secondary and unspecified malignant neoplasm of lymph nodes of multiple regions: Secondary | ICD-10-CM | POA: Diagnosis not present

## 2018-04-13 NOTE — Progress Notes (Signed)
Radiation Oncology Follow up Note  Name: Vickie Barton   Date:   04/13/2018 MRN:  [id number] DOB: [birth year]    This 85 y.o. female presents to the clinic today for one-month follow-up sit since salvage SB RT to her pelvic nodes for patient with high-grade serous carcinoma the endometrium.[name]  REFERRING PROVIDER: [name], MD  HPI: patient is now seen out 1 month having completed salvageSB RT treatment to her pelvic nodes based on PET/CT findings causing significant swelling in her lower extremities. She completed concurrent chemoradiation therapy with excellent response for IIIa high-grade serous carcinoma in the past.She is currently on letrozole Tylenol that well. She states she's having diarrhea about once a week does not follow any food restrictions or take Imodium. She states the swelling in her legs has improved..  COMPLICATIONS OF TREATMENT: none  FOLLOW UP COMPLIANCE: keeps appointments   PHYSICAL EXAM:  BP (!) 143/87   Pulse 91   Temp (!) 97.2 F (36.2 C)   Wt 133 lb 14.9 oz (60.7 kg)   BMI 25.31 kg/m  She has significant bilateral lower extremity lymphedema.Well-developed well-nourished patient in NAD. HEENT reveals PERLA, EOMI, discs not visualized.  Oral cavity is clear. No oral mucosal lesions are identified. Neck is clear without evidence of cervical or supraclavicular adenopathy. Lungs are clear to A&P. Cardiac examination is essentially unremarkable with regular rate and rhythm without murmur rub or thrill. Abdomen is benign with no organomegaly or masses noted. Motor sensory and DTR levels are equal and symmetric in the upper and lower extremities. Cranial nerves II through XII are grossly intact. Proprioception is intact. No peripheral adenopathy or edema is identified. No motor or sensory levels are noted. Crude visual fields are within normal range.  RADIOLOGY RESULTS: no current films for review  PLAN: at the present time she is tolerated SB RT for salvage  extremely well. She is having minimal diarrhea which does not bother her that much since she has a long history of constipation. She's not following any root food restrictions I've suggested Imodium when necessary. I've asked to see her back in 3-4 months for follow-up. She has a scant scheduled in the next several months which I've asked her to copy me on. Patient knows to call with any concerns. She continues close follow-up care with medical oncology and letrozole.  I would like to take this opportunity to thank you for allowing me to participate in the care of your patient.Noreene Filbert, MD

## 2018-04-29 ENCOUNTER — Other Ambulatory Visit: Payer: Self-pay | Admitting: *Deleted

## 2018-04-29 MED ORDER — OXYCODONE HCL 10 MG PO TABS: 10.0000 mg | ORAL_TABLET | Freq: Every day | ORAL | 0 refills | Status: DC | PRN

## 2018-04-29 NOTE — Telephone Encounter (Signed)
Patient called cancer center requesting refill of oxycodone 10 mg tabs daily PRN .   As mandated by the [place] STOP Act (Strengthen Opioid Misuse Prevention), the [place] Controlled Substance Reporting System ([place]) was reviewed for this patient.  Below is the past 52-months of controlled substance prescriptions as displayed by the registry.  I have personally consulted with my supervising physician, Dr. Grayland Ormond, who agrees that continuation of opiate therapy is medically appropriate at this time and agrees to provide continual monitoring, including urine/blood drug screens, as indicated. Refill is appropriate on or after 04/22/18.  [place] reviewed:     Vickie Casa, NP 04/29/2018 10:51 AM [phone]

## 2018-05-01 ENCOUNTER — Other Ambulatory Visit: Payer: Self-pay | Admitting: Oncology

## 2018-05-12 ENCOUNTER — Ambulatory Visit
Admission: RE | Admit: 2018-05-12 | Discharge: 2018-05-12 | Disposition: A | Discharge status: Home / Self Care | Payer: Medicare Other | Source: Ambulatory Visit | Attending: Oncology | Admitting: Oncology

## 2018-05-12 DIAGNOSIS — M810 Age-related osteoporosis without current pathological fracture: Secondary | ICD-10-CM | POA: Diagnosis not present

## 2018-05-12 DIAGNOSIS — C541 Malignant neoplasm of endometrium: Secondary | ICD-10-CM | POA: Insufficient documentation

## 2018-05-13 ENCOUNTER — Telehealth: Payer: Self-pay

## 2018-05-13 MED ORDER — ALENDRONATE SODIUM 70 MG PO TABS: 70.0000 mg | ORAL_TABLET | ORAL | 1 refills | Status: DC

## 2018-05-13 MED ORDER — CALCIUM-D 600-400 MG-UNIT PO TABS: 1.0000 | ORAL_TABLET | ORAL | 1 refills | Status: DC

## 2018-05-13 NOTE — Telephone Encounter (Signed)
-----   Message from [name], MD sent at 05/12/2018  5:25 PM EST ----- Patient has osteoporosis.  Please call in fosamax (weekly)  she will need ca+ and vit D supplement as well.  Thanks! ----- Message ----- From: Interface, Rad Results In Sent: 05/12/2018   9:29 AM EST To: [name], MD

## 2018-05-13 NOTE — Telephone Encounter (Signed)
Called patient to let her know that her bone density results stated that she has osteoporosis and Dr. Grayland Ormond would like for her to start taking Fosamax and Calcium and Vitamin D.  Patient wanted to mention that she had stopped taking her Letrozole 3 weeks ago because she stated that it was the cause for her to have diarrhea. Patient also stated that about 3 weeks ago she also started to have left leg pain. Patient wanted to know what she needed to do. Please advise.

## 2018-05-13 NOTE — Telephone Encounter (Signed)
Error

## 2018-05-15 NOTE — Telephone Encounter (Signed)
See [facility] on Monday.

## 2018-05-16 NOTE — Telephone Encounter (Signed)
Called patient back to offer her an appointment to come in and be seen by [facility]. Patient stated that she was actually feeling better and that she did not feel the need to be seen. However, patient stated that if she started to have diarrhea again, that she would call us back to schedule an appointment with [facility].

## 2018-05-20 ENCOUNTER — Inpatient Hospital Stay: Payer: Medicare Other

## 2018-05-20 ENCOUNTER — Ambulatory Visit
Admission: RE | Admit: 2018-05-20 | Discharge: 2018-05-20 | Disposition: A | Discharge status: Home / Self Care | Payer: Medicare Other | Source: Ambulatory Visit | Attending: Nurse Practitioner | Admitting: Nurse Practitioner

## 2018-05-20 ENCOUNTER — Telehealth: Payer: Self-pay | Admitting: *Deleted

## 2018-05-20 ENCOUNTER — Inpatient Hospital Stay: Payer: Medicare Other | Attending: Nurse Practitioner | Admitting: Nurse Practitioner

## 2018-05-20 ENCOUNTER — Encounter: Payer: Self-pay | Admitting: Nurse Practitioner

## 2018-05-20 ENCOUNTER — Other Ambulatory Visit: Payer: Self-pay

## 2018-05-20 VITALS — BP 127/78 | HR 77 | Temp 96.6°F | Resp 22

## 2018-05-20 DIAGNOSIS — Z86711 Personal history of pulmonary embolism: Secondary | ICD-10-CM | POA: Diagnosis not present

## 2018-05-20 DIAGNOSIS — R1032 Left lower quadrant pain: Secondary | ICD-10-CM | POA: Diagnosis not present

## 2018-05-20 DIAGNOSIS — Z7901 Long term (current) use of anticoagulants: Secondary | ICD-10-CM | POA: Insufficient documentation

## 2018-05-20 DIAGNOSIS — N951 Menopausal and female climacteric states: Secondary | ICD-10-CM

## 2018-05-20 DIAGNOSIS — D473 Essential (hemorrhagic) thrombocythemia: Secondary | ICD-10-CM | POA: Diagnosis not present

## 2018-05-20 DIAGNOSIS — C541 Malignant neoplasm of endometrium: Secondary | ICD-10-CM

## 2018-05-20 DIAGNOSIS — Z86718 Personal history of other venous thrombosis and embolism: Secondary | ICD-10-CM | POA: Diagnosis not present

## 2018-05-20 DIAGNOSIS — Z87891 Personal history of nicotine dependence: Secondary | ICD-10-CM | POA: Diagnosis not present

## 2018-05-20 DIAGNOSIS — G893 Neoplasm related pain (acute) (chronic): Secondary | ICD-10-CM | POA: Insufficient documentation

## 2018-05-20 DIAGNOSIS — Z7189 Other specified counseling: Secondary | ICD-10-CM

## 2018-05-20 DIAGNOSIS — K7689 Other specified diseases of liver: Secondary | ICD-10-CM | POA: Diagnosis not present

## 2018-05-20 DIAGNOSIS — M25552 Pain in left hip: Secondary | ICD-10-CM

## 2018-05-20 LAB — COMPREHENSIVE METABOLIC PANEL
ALT: 11 U/L (ref 0–44)
AST: 21 U/L (ref 15–41)
Albumin: 3.2 g/dL — ABNORMAL LOW (ref 3.5–5.0)
Alkaline Phosphatase: 81 U/L (ref 38–126)
Anion gap: 10 (ref 5–15)
BUN: 16 mg/dL (ref 8–23)
CO2: 26 mmol/L (ref 22–32)
Calcium: 8.8 mg/dL — ABNORMAL LOW (ref 8.9–10.3)
Chloride: 100 mmol/L (ref 98–111)
Creatinine, Ser: 0.81 mg/dL (ref 0.44–1.00)
GFR calc Af Amer: >60 mL/min (ref >60)
GFR calc non Af Amer: >60 mL/min (ref >60)
Glucose, Bld: 130 mg/dL — ABNORMAL HIGH (ref 70–99)
Potassium: 3.4 mmol/L — ABNORMAL LOW (ref 3.5–5.1)
Sodium: 136 mmol/L (ref 135–145)
Total Bilirubin: 0.4 mg/dL (ref 0.3–1.2)
Total Protein: 8.3 g/dL — ABNORMAL HIGH (ref 6.5–8.1)

## 2018-05-20 LAB — CBC WITH DIFFERENTIAL/PLATELET
ABS IMMATURE GRANULOCYTES: 0.05 10*3/uL (ref 0.00–0.07)
Basophils Absolute: 0 10*3/uL (ref 0.0–0.1)
Basophils Relative: 0 %
Eosinophils Absolute: 0 10*3/uL (ref 0.0–0.5)
Eosinophils Relative: 0 %
HCT: 32.5 % — ABNORMAL LOW (ref 36.0–46.0)
Hemoglobin: 10.3 g/dL — ABNORMAL LOW (ref 12.0–15.0)
Immature Granulocytes: 1 %
Lymphocytes Relative: 4 %
Lymphs Abs: 0.4 10*3/uL — ABNORMAL LOW (ref 0.7–4.0)
MCH: 27.9 pg (ref 26.0–34.0)
MCHC: 31.7 g/dL (ref 30.0–36.0)
MCV: 88.1 fL (ref 80.0–100.0)
Monocytes Absolute: 0.3 10*3/uL (ref 0.1–1.0)
Monocytes Relative: 3 %
NEUTROS ABS: 9.3 10*3/uL — AB (ref 1.7–7.7)
Neutrophils Relative %: 92 %
Platelets: 507 10*3/uL — ABNORMAL HIGH (ref 150–400)
RBC: 3.69 MIL/uL — ABNORMAL LOW (ref 3.87–5.11)
RDW: 12.9 % (ref 11.5–15.5)
WBC: 10.1 10*3/uL (ref 4.0–10.5)
nRBC: 0 % (ref 0.0–0.2)

## 2018-05-20 MED ORDER — OXYCODONE HCL 10 MG PO TABS: 10.0000 mg | ORAL_TABLET | Freq: Four times a day (QID) | ORAL | 0 refills | Status: DC | PRN

## 2018-05-20 MED ORDER — MORPHINE SULFATE (PF) 2 MG/ML IV SOLN
2.0000 mg | Freq: Once | INTRAVENOUS | Status: AC
  Administered 2018-05-20: 2 mg via INTRAVENOUS
  Filled 2018-05-20: qty 1

## 2018-05-20 MED ORDER — MORPHINE SULFATE ER 15 MG PO TBCR: 15.0000 mg | EXTENDED_RELEASE_TABLET | Freq: Two times a day (BID) | ORAL | 0 refills | Status: DC

## 2018-05-20 MED ORDER — IOHEXOL 300 MG/ML  SOLN
75.0000 mL | Freq: Once | INTRAMUSCULAR | Status: AC | PRN
  Administered 2018-05-20: 75 mL via INTRAVENOUS

## 2018-05-20 MED ORDER — SODIUM CHLORIDE 0.9 % IV SOLN
Freq: Once | INTRAVENOUS | Status: AC
  Administered 2018-05-20: 11:00:00 via INTRAVENOUS
  Filled 2018-05-20: qty 250

## 2018-05-20 NOTE — Patient Instructions (Signed)
It is common for patients who are undergoing treatment and taking certain prescribed medications to experience side-effects with constipation.  If you experience constipation, please take stool softeners such as Senna and/or Miralax every day to avoid constipation.  These medications are available over the counter.  Of course, if you have diarrhea, stop taking stool softeners.  Drinking plenty of fluid, eating fruits and vegetable, and being active also reduces the risk of constipation.   If despite taking stool softeners, and you still have no bowel movement for 2 days or more than your normal bowel habit frequency, please take one of the following over the counter laxatives:  Milk of Magnesia or Mag Citrate everyday and contact me immediately for further instructions.  The goal is to have at least one bowel movement every day or every other day without pain or straining.   It was a pleasure meeting you today and thank you for allowing me to participate in your care. -Lauren Allen, NP  

## 2018-05-20 NOTE — Telephone Encounter (Signed)
Patient called and states she is having pain at her radiation therapy site and her pain medicine is NOT helping at all. Accepts appointment for [facility]

## 2018-05-20 NOTE — Progress Notes (Signed)
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 Leonides Schanz.  She initially presented to Dr. Leonides Schanz for heavy PMB and underwent D&C hysteroscopy on 01/28/2016.  Pathology consistent with serous grade 2 endometrial cancer.  She opted for surgical management.  She underwent TLH-BSO with pelvic sentinel lymph nodes and biopsy on 02/26/2016.  Final pathology consistent with stage IIIa high grade serous adenocarcinoma of the uterus.  She declined adjuvant treatment on numerous occasions and missed multiple follow-up appointments.  She has been followed with serial scans.   10/2016- Presented to ER with altered mental status and was transferred to [facility].  During admission patient was found to have massive PE requiring EK OS catheter directed lysis.  Also found to have extensive left lower extremity clot burden likely due to extrinsic vessel compression from pelvic lymphadenopathy from metastatic endometrial cancer. She initiated anticoagulation.   12/25/2016-CT A/P demonstrated new moderate left perinephric fluid and edema, new moderate left hydronephrosis and hydroureter with obstruction of the distal left ureter by a large left pelvic mass and metastatic adenopathy.  Interval increase in metastatic adenopathy within the abdomen and pelvis, resolving areas of consolidation in right middle lobe and right lower lobe at site of prior pulmonary infarcts.  New nodular density in the left lower lobe with questionable etiology.  She was having significant pain  due to metastatic disease and was evaluated by Urology/Dr. Pilar Jarvis. She underwent cystoscopy, left retrograde pyelogram, and left ureteral stent placement by Dr. Pilar Jarvis on 01/01/2017.   She initiated concurrent chemotherapy & radiation with carbo-Taxol on 01/27/2017.  Completed 6 cycles on 04/21/2017. Stent removed on 04/23/2017.   PET scan on 05/26/2017 showed interval decrease in size of cystic and solid mass in left adnexa.  Hypermetabolic activity along the lateral margin of this lesion suggests residual viable carcinoma.  No new or progressive malignancy in pelvis.  No evidence of metastatic disease outside pelvis.    She was referred to radiation oncology for local control of residual disease and initiated on 06/30/2017- 08/04/2017, boost completed 09/02/2017.    CT A/P on 09/28/2017 showed reduction in size of left pelvic sidewall mass (measures 4.7 x 3.9 cm; formerly 5.2 x 6.0 cm 03/26/2017 and 4.8 x 4.7 cm on 05/26/2017) and prominent reduction in size of precaval lymph node.  Some enlargement of right common iliac lymph node giving overall mixed appearance.  10/12/2017- US Venous Img Lower Bilateral for 2 month hx of leg swelling was positive for dvt in left lower extremity.  Review of prior CT suggested that this is chronic finding and secondary to left pelvic sidewall mass. Per Dr. Grayland Ormond unclear if worsening acute or continued chronic. Eliquis increased to 34m BID.   CT A/P on 01/10/2018 showed left pelvic sidewall mass (4.4 x 3.6 cm) and precaval lymph node centrally stable compared to prior.  Significant enlargement and right common iliac nodal mass (3.1 x 1.9 cm today versus 1.9 x 1.5 cm on prior 09/28/2017).  Findings concerning for progressive nodal metastatic disease.   It was unclear if lymph nodes were in prior radiation field and she was  referred back to radiation oncology for evaluation.   PET on 02/04/2018 demonstrated abnormal hypermetabolic activity and enlarged retroperitoneal and  bilateral common iliac lymph nodes consistent with metastatic adenopathy.  Mild FDG uptake associated with left adnexal region solid and cystic lesion measuring 4.4 cm with SUV max of 2.8.  She received SBRT of 3000 cGy in 5 fractions to areas of corresponding hypermetabolic activity.  Completed on 03/08/2018.  She initiated maintenance letrozole 03/2018.  Patient last seen at the cancer center on 04/13/2018 by Dr. Donella Stade for 1 month follow-up after receiving salvage SBRT to pelvic nodes.  She appeared to have tolerated therapy well with only minimal diarrhea.  Has longstanding history of constipation.  Imodium recommended for diarrhea.  Scheduled to be seen in clinic in 3 to 4 months.  Interval history- Vickie Barton, 85 year old female, with above history of metastatic endometrial cancer, presents to [facility] for left lower abdominal pain unrelieved with current pain medication regimen. She states that pain started worsening last week but she has had this pain chronically since her diagnosis. Since last week, it briefly improved then has worsened throughout the week. She rates pain 10/10 today. She has been taking oxycodone 10 mg tablets without significant improvement and says she is going to run out of medication over the weekend. Nothing seems to make pain better or worse- seems to occur without inciting activity. Pain has significant impact on her sleep, comfort, and ability to be independent. She has moved her bowels earlier today. Notes chronic left swelling, worse on left with known history of dvt in left leg associated with pelvic mass. IVC Filter in place. On Eliquis. Reports compliance and no missed doses. Able to tolerate orals. No diarrhea, nausea, or vomiting. Denies urinary complaints. No bleeding or black stools.   She had started letrozole but says she discontinued it earlier this week due to hot flashes and her pain. She says she is unable to tolerate this medication and questions other  options.   ECOG FS:2 - Symptomatic, <50% confined to bed  Review of systems- Review of Systems  Constitutional: Positive for malaise/fatigue. Negative for chills, fever and weight loss.  HENT: Negative for congestion, ear pain and tinnitus.   Eyes: Negative.  Negative for blurred vision and double vision.  Respiratory: Negative.  Negative for cough, sputum production, shortness of breath and wheezing.   Cardiovascular: Positive for leg swelling. Negative for chest pain, palpitations and orthopnea.  Gastrointestinal: Positive for abdominal pain (LLQ radiates to left hip). Negative for constipation, diarrhea, nausea and vomiting.  Genitourinary: Negative for dysuria, flank pain, frequency, hematuria and urgency.  Musculoskeletal: Positive for joint pain (left hip pain). Negative for back pain, falls and myalgias.  Skin: Negative.  Negative for rash.       Hot flashes and sweats  Neurological: Negative.  Negative for weakness and headaches.  Endo/Heme/Allergies: Negative.  Does not bruise/bleed easily.  Psychiatric/Behavioral: Negative for depression. The patient has insomnia. The patient is not nervous/anxious.     Current treatment- letrozole- self discontinued 5 days ago  Allergies  Allergen Reactions  . Sulfa Antibiotics Rash  . Zinc Rash    Past Medical History:  Diagnosis Date  . Arthritis   . CHF (congestive heart failure) ([place])   . Constipation   . DVT (deep venous thrombosis) ([place]) 03/2017   left leg  . Endometrial cancer ([place]) 01/28/2016  . Hyperlipidemia   . Hypertension   . Hypothyroidism     Past Surgical History:  Procedure Laterality Date  . CYSTOSCOPY W/ RETROGRADES Left 01/01/2017   Procedure: CYSTOSCOPY WITH RETROGRADE PYELOGRAM;  Surgeon: Nickie Retort, MD;  Location: [facility] OR[facility];  Service: Urology;  Laterality: Left;  . CYSTOSCOPY W/ RETROGRADES Left 04/23/2017   Procedure: CYSTOSCOPY WITH RETROGRADE PYELOGRAM;  Surgeon: Abbie Sons, MD;  Location:  [facility];  Service: Urology;  Laterality: Left;  . CYSTOSCOPY W/ URETERAL STENT PLACEMENT Left 04/23/2017   Procedure: CYSTOSCOPY WITH STENT REMOVAL;  Surgeon: Abbie Sons, MD;  Location: [facility];  Service: Urology;  Laterality: Left;  . CYSTOSCOPY WITH STENT PLACEMENT Left 01/01/2017   Procedure: CYSTOSCOPY WITH STENT PLACEMENT;  Surgeon: Nickie Retort, MD;  Location: [facility] OR[facility];  Service: Urology;  Laterality: Left;  . DILATION AND CURETTAGE OF UTERUS    . HYSTEROSCOPY W/D&C N/A 01/28/2016   Procedure: DILATATION AND CURETTAGE /HYSTEROSCOPY;  Surgeon: [name], MD;  Location: [facility];  Service: Gynecology;  Laterality: N/A;  . LAPAROSCOPIC BILATERAL SALPINGO OOPHERECTOMY Bilateral 02/26/2016   Procedure: LAPAROSCOPIC BILATERAL SALPINGO OOPHORECTOMY;  Surgeon: [name], MD;  Location: [facility];  Service: Gynecology;  Laterality: Bilateral;  . LAPAROSCOPIC HYSTERECTOMY N/A 02/26/2016   Procedure: HYSTERECTOMY TOTAL LAPAROSCOPIC;  Surgeon: [name], MD;  Location: [facility];  Service: Gynecology;  Laterality: N/A;  . SENTINEL NODE BIOPSY N/A 02/26/2016   Procedure: SENTINEL NODE BIOPSY;  Surgeon: [name], MD;  Location: [facility];  Service: Gynecology;  Laterality: N/A;  . TONSILLECTOMY      Social History   Socioeconomic History  . Marital status: Widowed    Spouse name: Not on file  . Number of children: Not on file  . Years of education: Not on file  . Highest education level: Not on file  Occupational History  . Occupation: retired  Scientific laboratory technician  . Financial resource strain: Not on file  . Food insecurity:    Worry: Not on file    Inability: Not on file  . Transportation needs:    Medical: Not on file    Non-medical: Not on file  Tobacco Use  . Smoking status: Former Research scientist (life sciences)  . Smokeless tobacco: Never Used  Substance and Sexual Activity  . Alcohol use: No  . Drug use: No  . Sexual activity: Not on file  Lifestyle  . Physical activity:    Days  per week: Not on file    Minutes per session: Not on file  . Stress: Not on file  Relationships  . Social connections:    Talks on phone: Not on file    Gets together: Not on file    Attends religious service: Not on file    Active member of club or organization: Not on file    Attends meetings of clubs or organizations: Not on file    Relationship status: Not on file  . Intimate partner violence:    Fear of current or ex partner: Not on file    Emotionally abused: Not on file    Physically abused: Not on file    Forced sexual activity: Not on file  Other Topics Concern  . Not on file  Social History Narrative  . Not on file    Family History  Problem Relation Age of Onset  . Diabetes Father   . Heart disease Father   . Hypertension Father   . Stroke Father   . Diabetes Paternal Grandmother   . Heart disease Mother   . Hypertension Mother   .  Stroke Mother   . Hypertension Sister   . Thyroid disease Sister   . Hypertension Brother     Current Outpatient Medications:  .  acetaminophen (TYLENOL) 500 MG tablet, Take 1,000 mg by mouth every 6 (six) hours as needed., Disp: , Rfl:  .  ELIQUIS 5 MG TABS tablet, TAKE 2 TABLETS BY MOUTH TWICE A DAY, Disp: 120 tablet, Rfl: 5 .  furosemide (LASIX) 20 MG tablet, Take 20 mg by mouth 2 (two) times daily., Disp: , Rfl: 0 .  letrozole (FEMARA) 2.5 MG tablet, Take 1 tablet (2.5 mg total) by mouth daily., Disp: 30 tablet, Rfl: 3 .  levothyroxine (SYNTHROID, LEVOTHROID) 150 MCG tablet, TAKE 150 MCG BY MOUTH DAILY BEFORE BREAKFAST ON EMPTY STOMACH WITH A GLASS OF WATER 30-60MINS BEFORE BREAKFAST, Disp: , Rfl: 2 .  Oxycodone HCl 10 MG TABS, Take 1 tablet (10 mg total) by mouth daily as needed., Disp: 30 tablet, Rfl: 0 .  alendronate (FOSAMAX) 70 MG tablet, Take 1 tablet (70 mg total) by mouth once a week. Take with a full glass of water on an empty stomach. (Patient not taking: Reported on 05/20/2018), Disp: 4 tablet, Rfl: 1 .  Calcium  Carbonate-Vitamin D (CALCIUM-D) 600-400 MG-UNIT TABS, Take 1 tablet by mouth 1 day or 1 dose for 1 dose. (Patient not taking: Reported on 05/20/2018), Disp: 30 tablet, Rfl: 1  Physical exam:  Vitals:   05/20/18 1012  BP: 127/78  Pulse: 77  Resp: (!) 22  Temp: (!) 96.6 F (35.9 C)  TempSrc: Tympanic   Physical Exam Constitutional:      Comments: Hair appears sweaty. Painful appearing. In wheelchair. Accompanied.   HENT:     Head: Normocephalic and atraumatic.     Mouth/Throat:     Mouth: Mucous membranes are moist.     Pharynx: Oropharynx is clear.  Eyes:     General: No scleral icterus.    Pupils: Pupils are equal, round, and reactive to light.  Neck:     Musculoskeletal: Normal range of motion and neck supple.  Cardiovascular:     Rate and Rhythm: Normal rate and regular rhythm.     Heart sounds: Normal heart sounds. No murmur.  Pulmonary:     Effort: Pulmonary effort is normal.     Breath sounds: Normal breath sounds. No wheezing.  Abdominal:     General: Bowel sounds are normal. There is no distension.     Tenderness: There is abdominal tenderness in the left lower quadrant. There is no rebound.     Comments: Rounded. Exam limited d/t pain  Musculoskeletal: Normal range of motion.     Right lower leg: Edema (2+) present.     Left lower leg: Edema (2-3+) present.  Lymphadenopathy:     Cervical: No cervical adenopathy.  Skin:    General: Skin is warm and dry.     Findings: No rash.  Neurological:     Mental Status: She is alert and oriented to person, place, and time.     Motor: Weakness present.  Psychiatric:        Mood and Affect: Mood is anxious.        Behavior: Behavior is cooperative.        Cognition and Memory: Cognition and memory normal.      CMP Latest Ref Rng & Units 01/18/2018  Glucose 70 - 99 mg/dL 84  BUN 8 - 23 mg/dL 16  Creatinine 0.44 - 1.00 mg/dL 0.79  Sodium 135 -  145 mmol/L 135  Potassium 3.5 - 5.1 mmol/L 3.9  Chloride 98 - 111 mmol/L  100  CO2 22 - 32 mmol/L 28  Calcium 8.9 - 10.3 mg/dL 9.2  Total Protein 6.5 - 8.1 g/dL 7.7  Total Bilirubin 0.3 - 1.2 mg/dL 0.5  Alkaline Phos 38 - 126 U/L 98  AST 15 - 41 U/L 18  ALT 0 - 44 U/L 10   CBC Latest Ref Rng & Units 01/18/2018  WBC 4.0 - 10.5 K/uL 4.7  Hemoglobin 12.0 - 15.0 g/dL 11.2(L)  Hematocrit 36.0 - 46.0 % 35.5(L)  Platelets 150 - 400 K/uL 245    No images are attached to the encounter.  Dg Bone Density  Result Date: 05/12/2018 EXAM: DUAL X-RAY ABSORPTIOMETRY (DXA) FOR BONE MINERAL DENSITY IMPRESSION: Technologist: SCE PATIENT BIOGRAPHICAL: Name: Vickie, Barton Patient ID: [id number] Birth Date: [birth year] Height: 60.0 in. Gender: Female Exam Date: 05/12/2018 Weight: 132.5 lbs. Indications: Advanced Age, Caucasian, Height Loss, History of Chemo, History of Endometrial Cancer, History of Radiation, Hypothyroid, Hysterectomy, Oophorectomy Bilateral, Postmenopausal Fractures: Treatments: Levothyroxine ASSESSMENT: The BMD measured at Forearm Radius 33% is 0.534 g/cm2 with a T-score of -3.9. This patient is considered osteoporotic according to [facility]) criteria. The quality of the scan was limited. Lumbar spine was not utilized due to surgrical hardware visible. Site Region Measured Measured WHO Young Adult BMD Date       Age      Classification T-score DualFemur Total Right 05/12/2018 85.8 Osteoporosis -3.0 0.628 g/cm2 Left Forearm Radius 33% 05/12/2018 85.8 Osteoporosis -3.9 0.534 g/cm2 World Health Organization [facility]) criteria for post-menopausal, Caucasian Women: Normal:       T-score at or above -1 SD Osteopenia:   T-score between -1 and -2.5 SD Osteoporosis: T-score at or below -2.5 SD RECOMMENDATIONS: 1. All patients should optimize calcium and vitamin D intake. 2. Consider FDA-approved medical therapies in postmenopausal women and men aged 8 years and older, based on the following: a. A hip or vertebral(clinical or morphometric) fracture b. T-score <  -2.5 at the femoral neck or spine after appropriate evaluation to exclude secondary causes c. Low bone mass (T-score between -1.0 and -2.5 at the femoral neck or spine) and a 10-year probability of a hip fracture > 3% or a 10-year probability of a major osteoporosis-related fracture > 20% based on the [place]-adapted WHO algorithm d. Clinician judgment and/or patient preferences may indicate treatment for people with 10-year fracture probabilities above or below these levels FOLLOW-UP: People with diagnosed cases of osteoporosis or at high risk for fracture should have regular bone mineral density tests. For patients eligible for Medicare, routine testing is allowed once every 2 years. The testing frequency can be increased to one year for patients who have rapidly progressing disease, those who are receiving or discontinuing medical therapy to restore bone mass, or have additional risk factors. I have reviewed this report, and agree with the above findings. [facility] Radiology Electronically Signed   By: Lowella Grip III M.D.   On: 05/12/2018 09:27     Assessment and plan- Patient is a 85 y.o. female diagnosed with recurrent endometrial cancer who [facility] for abdominal [facility].   1.  Recurrent stage 3a high grade serous endometrial cancer with metastatic disease- s/p TLH-BSO pSLN on 02/26/2016. Pathology consistent with stage 3A serous adenocarcinoma of the uterus. MSI/MMR intact/retained.  Adjuvant treatment was recommended but patient declined. Recurrent metastatic disease 10/2016 s/p radiation to pelvic nodes. Currently on letrozole  but self-discontinued d/t intolerance (hot flashes) after 1 week of treatment.   CT today shows mixed response-left pelvic sidewall mass contiguous with left iliacus muscle measuring 7.3 x 3.9 x 5.8 cm (previously 6.1 x 3.6 x 4.64 cm).  No bone invasion.  Could consider checking HER2 though she has previously declined chemotherapy so I'm not sure  there is role for herceptin. Will refer back to gyn-onc for pelvic exam and discussion of treatment options. Would consider switching from letrozole (non-steroidal AI) to aromasin (steroidal AI).   2. Pain associated with malignancy-  LLQ/left hip pain likely secondary to enlargement of left pelvic sidewall mass.  Start long acting ms contin 15 mg BID with oxycodone 10 mg q6h prn for breakthrough.   3. History of PE/DVT- Known dvt in left leg secondary to compression by pelvic mass.  IVC filter in place & on anticoagulation. Continue Eliquis as prescribed.   4. Goals of Care- patient has previously declined chemotherapy and today expresses that she is not interested in chemotherapy but may consider other therapies, particularly oral therapies but that quality of life is most important to her. Given progression of disease and significant pain, will refer to palliative care for advanced care planning and ongoing management of pain associated with malignancy. Recommend consideration of home palliative care services as she lives alone & does not drive. If she declines future treatment, consider hospice services.   5. Thrombocytosis - plt 507. Likely reactive secondary to disease. Continue to monitor.    Findings discussed with Dr. Grayland Ormond who recommends re-evaluation and pelvic exam with Dr. Theora Gianotti (patient requests female provider). I will also refer to palliative care to palliative care for advanced care planning and ongoing management of pain associated with malignancy, consideration of home palliative care and/or hospice.   Visit Diagnosis 1. Endometrial cancer ([place])   2. Pain of metastatic malignancy    Patient expressed understanding and was in agreement with this plan. She also understands that She can call clinic at any time with any questions, concerns, or complaints.   Thank you for allowing me to participate in the care of this very pleasant patient.   Beckey Rutter, DNP, AGNP-C [facility] [place] (work cell) [phone] (office)  CC: Dr. Grayland Ormond Dr. Precious Gilding, NP

## 2018-05-23 ENCOUNTER — Telehealth: Payer: Self-pay | Admitting: *Deleted

## 2018-05-23 NOTE — Telephone Encounter (Signed)
Call returned to patient and advised patient to increase her MS ER to every 8 hours. She repeated this back to me and thanked me for calling her back

## 2018-05-23 NOTE — Telephone Encounter (Signed)
Try changing the MS contin to q8hrs.

## 2018-05-23 NOTE — Telephone Encounter (Signed)
Patient called to report that her pain is much improved from what it was Friday, but it is not controlled yet, She reports that at least 30 mins prior to next dose , the medicine wears off and sometimes it does not help at all. Asking if dose can be adjusted to give her more comfort. Please advise

## 2018-05-24 DIAGNOSIS — Z86718 Personal history of other venous thrombosis and embolism: Secondary | ICD-10-CM | POA: Insufficient documentation

## 2018-05-25 ENCOUNTER — Telehealth: Payer: Self-pay | Admitting: *Deleted

## 2018-05-25 NOTE — Telephone Encounter (Signed)
Patient called and reports that she is having uncontrolled pain again and wants to know if she needs more treatment for it . Also reports that it takes an hour after her medicine before pain subsides and then it wears off before it is due again. Please advise

## 2018-05-25 NOTE — Telephone Encounter (Signed)
Per VO Dr Grayland Ormond, increase Oxycodone to every 4 hours as needed. Give increase of MSER more time to take effect as it has only been 2 days. Have her call Friday morning if not improved. Refer to Palliative Care.  Call returned to patient and advised can take Oxycodone every 4 hours as needed and that the MSER increase needs more time to show efficacy Advised if pain not better by Friday AM to call early Friday and not wait until end of day or call on weekend. She also accepted an appointment with J borders, NP Palliative Care for tomorrow at 230 PM

## 2018-05-26 ENCOUNTER — Other Ambulatory Visit: Payer: Self-pay

## 2018-05-26 ENCOUNTER — Inpatient Hospital Stay: Payer: Medicare Other | Attending: Hospice and Palliative Medicine | Admitting: Hospice and Palliative Medicine

## 2018-05-26 ENCOUNTER — Inpatient Hospital Stay: Payer: Medicare Other

## 2018-05-26 DIAGNOSIS — G893 Neoplasm related pain (acute) (chronic): Secondary | ICD-10-CM

## 2018-05-26 DIAGNOSIS — R197 Diarrhea, unspecified: Secondary | ICD-10-CM

## 2018-05-26 DIAGNOSIS — N1330 Unspecified hydronephrosis: Secondary | ICD-10-CM | POA: Diagnosis not present

## 2018-05-26 DIAGNOSIS — Z515 Encounter for palliative care: Secondary | ICD-10-CM | POA: Diagnosis not present

## 2018-05-26 DIAGNOSIS — C541 Malignant neoplasm of endometrium: Secondary | ICD-10-CM

## 2018-05-26 DIAGNOSIS — Z87891 Personal history of nicotine dependence: Secondary | ICD-10-CM

## 2018-05-26 DIAGNOSIS — Z7901 Long term (current) use of anticoagulants: Secondary | ICD-10-CM | POA: Diagnosis not present

## 2018-05-26 DIAGNOSIS — Z86718 Personal history of other venous thrombosis and embolism: Secondary | ICD-10-CM | POA: Diagnosis not present

## 2018-05-26 DIAGNOSIS — Z86711 Personal history of pulmonary embolism: Secondary | ICD-10-CM

## 2018-05-26 LAB — CBC WITH DIFFERENTIAL/PLATELET
Abs Immature Granulocytes: 0.08 10*3/uL — ABNORMAL HIGH (ref 0.00–0.07)
Basophils Absolute: 0 10*3/uL (ref 0.0–0.1)
Basophils Relative: 0 %
Eosinophils Absolute: 0 10*3/uL (ref 0.0–0.5)
Eosinophils Relative: 0 %
HEMATOCRIT: 29.5 % — AB (ref 36.0–46.0)
Hemoglobin: 9.3 g/dL — ABNORMAL LOW (ref 12.0–15.0)
Immature Granulocytes: 1 %
Lymphocytes Relative: 7 %
Lymphs Abs: 0.8 10*3/uL (ref 0.7–4.0)
MCH: 27.5 pg (ref 26.0–34.0)
MCHC: 31.5 g/dL (ref 30.0–36.0)
MCV: 87.3 fL (ref 80.0–100.0)
MONOS PCT: 7 %
Monocytes Absolute: 0.7 10*3/uL (ref 0.1–1.0)
Neutro Abs: 9.6 10*3/uL — ABNORMAL HIGH (ref 1.7–7.7)
Neutrophils Relative %: 85 %
Platelets: 500 10*3/uL — ABNORMAL HIGH (ref 150–400)
RBC: 3.38 MIL/uL — ABNORMAL LOW (ref 3.87–5.11)
RDW: 13.3 % (ref 11.5–15.5)
WBC: 11.2 10*3/uL — ABNORMAL HIGH (ref 4.0–10.5)
nRBC: 0 % (ref 0.0–0.2)

## 2018-05-26 LAB — COMPREHENSIVE METABOLIC PANEL
ALT: 17 U/L (ref 0–44)
ANION GAP: 11 (ref 5–15)
AST: 29 U/L (ref 15–41)
Albumin: 3 g/dL — ABNORMAL LOW (ref 3.5–5.0)
Alkaline Phosphatase: 70 U/L (ref 38–126)
BUN: 13 mg/dL (ref 8–23)
CO2: 27 mmol/L (ref 22–32)
Calcium: 8.2 mg/dL — ABNORMAL LOW (ref 8.9–10.3)
Chloride: 95 mmol/L — ABNORMAL LOW (ref 98–111)
Creatinine, Ser: 0.79 mg/dL (ref 0.44–1.00)
GFR calc Af Amer: >60 mL/min (ref >60)
GFR calc non Af Amer: >60 mL/min (ref >60)
GLUCOSE: 108 mg/dL — AB (ref 70–99)
Potassium: 3.6 mmol/L (ref 3.5–5.1)
Sodium: 133 mmol/L — ABNORMAL LOW (ref 135–145)
Total Bilirubin: 0.4 mg/dL (ref 0.3–1.2)
Total Protein: 8 g/dL (ref 6.5–8.1)

## 2018-05-26 MED ORDER — MORPHINE SULFATE ER 30 MG PO TBCR: 30.0000 mg | EXTENDED_RELEASE_TABLET | Freq: Two times a day (BID) | ORAL | 0 refills | Status: DC

## 2018-05-26 MED ORDER — OXYCODONE HCL 10 MG PO TABS: 10.0000 mg | ORAL_TABLET | ORAL | 0 refills | Status: DC | PRN

## 2018-05-26 NOTE — Progress Notes (Signed)
[place]  [phone] [phone]   Name: Vickie Barton Date: 05/26/2018 MRN: [id number]  DOB: 06/29/1930  Patient Care Team: [name], MD as PCP - General (Family Medicine)    REASON FOR CONSULTATION: Palliative Care consult requested for this 85 y.o. 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  PMH also notable for history of PE/DVT on anticoagulation with Eliquis and status post IVC filter, left hydronephrosis and hydroureter status post ureteral stent placement.  Patient has had chronic pain from progressive metastatic disease.  CT on 05/20/2018 reveals large left pelvic sidewall mass, which had increased in size from previous study and likely is exacerbating pain. She is referred to palliative care to help with symptom management and to address goals.  SOCIAL HISTORY:     reports that she has quit smoking. She has never used smokeless tobacco. She reports that she does not drink alcohol or use drugs.   Patient is widowed.  She lives with her daughter.  Patient had a son who died in 04-26-18 from cancer.  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  She later worked at an [employer].  ADVANCE DIRECTIVES:  Does not have  CODE STATUS:   PAST MEDICAL HISTORY: Past Medical History:  Diagnosis Date  . Arthritis   . CHF (congestive heart failure) ([place])   . Constipation   . DVT (deep venous thrombosis) ([place]) 2017-04-26   left leg  . Endometrial cancer ([place]) 01/28/2016   Hysterectomy, chemo + rad tx's, also internal brachytherapy on 02/2018.   [name] Hyperlipidemia   . Hypertension   . Hypothyroidism     PAST SURGICAL HISTORY:  Past Surgical History:  Procedure Laterality Date  .  CYSTOSCOPY W/ RETROGRADES Left 01/01/2017   Procedure: CYSTOSCOPY WITH RETROGRADE PYELOGRAM;  Surgeon: Nickie Retort, MD;  Location: [facility];  Service: Urology;  Laterality: Left;  . CYSTOSCOPY W/ RETROGRADES Left 04/23/2017   Procedure: CYSTOSCOPY WITH RETROGRADE PYELOGRAM;  Surgeon: Abbie Sons, MD;  Location: [facility];  Service: Urology;  Laterality: Left;  . CYSTOSCOPY W/ URETERAL STENT PLACEMENT Left 04/23/2017   Procedure: CYSTOSCOPY WITH STENT REMOVAL;  Surgeon: Abbie Sons, MD;  Location: [facility];  Service: Urology;  Laterality: Left;  . CYSTOSCOPY WITH STENT PLACEMENT Left 01/01/2017   Procedure: CYSTOSCOPY WITH STENT PLACEMENT;  Surgeon: Nickie Retort, MD;  Location: [facility];  Service: Urology;  Laterality: Left;  . DILATION AND CURETTAGE OF UTERUS    . HYSTEROSCOPY W/D&C N/A 01/28/2016   Procedure: DILATATION AND CURETTAGE /HYSTEROSCOPY;  Surgeon: [name], MD;  Location: [facility];  Service: Gynecology;  Laterality: N/A;  . LAPAROSCOPIC BILATERAL SALPINGO OOPHERECTOMY Bilateral 02/26/2016   Procedure: LAPAROSCOPIC BILATERAL SALPINGO OOPHORECTOMY;  Surgeon: [name], MD;  Location: [facility];  Service: Gynecology;  Laterality: Bilateral;  . LAPAROSCOPIC HYSTERECTOMY N/A 02/26/2016   Procedure: HYSTERECTOMY TOTAL LAPAROSCOPIC;  Surgeon: [name], MD;  Location: [facility];  Service: Gynecology;  Laterality: N/A;  . SENTINEL NODE BIOPSY N/A 02/26/2016   Procedure: SENTINEL NODE BIOPSY;  Surgeon: [name], MD;  Location: [facility];  Service: Gynecology;  Laterality: N/A;  . TONSILLECTOMY      HEMATOLOGY/ONCOLOGY HISTORY:   No history exists.    ALLERGIES:  is allergic to sulfa antibiotics  and zinc.  MEDICATIONS:  Current Outpatient Medications  Medication Sig Dispense Refill  . acetaminophen (TYLENOL) 500 MG tablet Take 1,000 mg by mouth every 6 (six) hours as needed.    [name] alendronate (FOSAMAX) 70 MG tablet Take 1 tablet (70 mg total) by mouth  once a week. Take with a full glass of water on an empty stomach. (Patient not taking: Reported on 05/20/2018) 4 tablet 1  . Calcium Carbonate-Vitamin D (CALCIUM-D) 600-400 MG-UNIT TABS Take 1 tablet by mouth 1 day or 1 dose for 1 dose. (Patient not taking: Reported on 05/20/2018) 30 tablet 1  . ELIQUIS 5 MG TABS tablet TAKE 2 TABLETS BY MOUTH TWICE A DAY 120 tablet 5  . furosemide (LASIX) 20 MG tablet Take 20 mg by mouth 2 (two) times daily.  0  . letrozole (FEMARA) 2.5 MG tablet Take 1 tablet (2.5 mg total) by mouth daily. 30 tablet 3  . levothyroxine (SYNTHROID, LEVOTHROID) 150 MCG tablet TAKE 150 MCG BY MOUTH DAILY BEFORE BREAKFAST ON EMPTY STOMACH WITH A GLASS OF WATER 30-60MINS BEFORE BREAKFAST  2  . morphine (MS CONTIN) 15 MG 12 hr tablet Take 1 tablet (15 mg total) by mouth every 12 (twelve) hours. Long-acting pain medication 60 tablet 0  . Oxycodone HCl 10 MG TABS Take 1 tablet (10 mg total) by mouth every 6 (six) hours as needed (for breakthrough pain). 60 tablet 0   No current facility-administered medications for this visit.     VITAL SIGNS: There were no vitals taken for this visit. There were no vitals filed for this visit.  Estimated body mass index is 25.31 kg/m as calculated from the following:   Height as of 04/04/18: _0  (1.549 m).   Weight as of 04/13/18: 133 lb 14.9 oz (60.7 kg).  LABS: CBC:    Component Value Date/Time   WBC 10.1 05/20/2018 1043   HGB 10.3 (L) 05/20/2018 1043   HCT 32.5 (L) 05/20/2018 1043   PLT 507 (H) 05/20/2018 1043   MCV 88.1 05/20/2018 1043   NEUTROABS 9.3 (H) 05/20/2018 1043   LYMPHSABS 0.4 (L) 05/20/2018 1043   MONOABS 0.3 05/20/2018 1043   EOSABS 0.0 05/20/2018 1043   BASOSABS 0.0 05/20/2018 1043   Comprehensive Metabolic Panel:    Component Value Date/Time   NA 136 05/20/2018 1043   K 3.4 (L) 05/20/2018 1043   CL 100 05/20/2018 1043   CO2 26 05/20/2018 1043   BUN 16 05/20/2018 1043   CREATININE 0.81 05/20/2018 1043   GLUCOSE  130 (H) 05/20/2018 1043   CALCIUM 8.8 (L) 05/20/2018 1043   AST 21 05/20/2018 1043   ALT 11 05/20/2018 1043   ALKPHOS 81 05/20/2018 1043   BILITOT 0.4 05/20/2018 1043   PROT 8.3 (H) 05/20/2018 1043   ALBUMIN 3.2 (L) 05/20/2018 1043    RADIOGRAPHIC STUDIES: Ct Abdomen Pelvis W Contrast  Result Date: 05/20/2018 CLINICAL DATA:  Pt c/o severe Left groin pain radiating down her Left leg x 2 weeks. NKI Hx Endometrial CA diagnosed in 01/2017 and she had total hysterectomy, chemo + rad tx's. Also had internal brachytherapy on 02/2018. EXAM: CT ABDOMEN AND PELVIS WITH CONTRAST TECHNIQUE: Multidetector CT imaging of the abdomen and pelvis was performed using the standard protocol following bolus administration of intravenous contrast. CONTRAST:  87m OMNIPAQUE IOHEXOL 300 MG/ML  SOLN COMPARISON:  Current abdomen radiographs. PET-CT, 02/04/2018. Abdomen and pelvis CT, 01/10/2018. FINDINGS: Lower chest: No acute abnormality. Hepatobiliary: Normal liver. Gallbladder is distended but otherwise unremarkable.  No bile duct dilation. Lucency noted along the right margin of the liver on the current abdomen radiographs is likely due to the right colon adjacent to the liver, accentuated by patient rotation. Pancreas: Unremarkable. No pancreatic ductal dilatation or surrounding inflammatory changes. Spleen: 9 mm low-density lesion in the medial spleen, stable from the prior exam, likely a cyst or hemangioma. Spleen normal in size. No other masses or lesions. Adrenals/Urinary Tract: No adrenal masses. No adrenal masses. Low-density right renal masses consistent with cysts, stable. No left renal masses. Mild left hydronephrosis with dilation of the proximal and mid left ureter. No ureteral stone. Ureter comes in contact with a left pelvic sidewall mass. No right hydronephrosis. Normal right ureter. Bladder is unremarkable. Stomach/Bowel: Stomach is unremarkable. Small bowel and colon are normal in caliber. There are colonic  diverticula without diverticulitis. No small bowel or colon wall thickening or inflammation. Normal appendix visualized. Vascular/Lymphatic: Aortic atherosclerosis extending to its branch vessels. No aneurysm. Stable inferior vena cava filter Left pelvic sidewall masses with associated calcifications, which may reflect locally invasive endometrial carcinoma or metastatic adenopathy. The precaval and right common iliac artery lymphadenopathy noted on the prior study has significantly improved with no residual enlarged lymph nodes. No other evidence of lymphadenopathy. Reproductive: Status post hysterectomy. Left pelvic sidewall mass, which is contiguous with the left iliacus muscle. Mass measures 7.3 x 3.9 x 5.8 cm, measuring 6.1 x 3.6 x 4.64 cm on the prior CT. There are associated calcifications mostly along its inferior margin. No underlying bone resorption. Other: No abdominal wall hernia.  No ascites. Musculoskeletal: No fracture or acute finding. No osteoblastic or osteolytic lesions. IMPRESSION: 1. The lucency along the right margin of the liver noted on the current abdomen radiographs was artifactual, most likely due to the right colon along the anterolateral liver margin, accentuated by patient rotation. 2. Mild left hydronephrosis and hydroureter, new since the prior CT. This appears due to an extrinsic effect by left pelvic sidewall mass, which contacts the distal left ureter. No ureteral stone. 3. Left pelvic sidewall mass, consistent with locally invasive endometrial carcinoma or metastatic adenopathy or a combination, has increased in size from the prior CT. However, the precaval and right common iliac chain adenopathy noted on the prior study has significantly improved. 4. No other evidence of metastatic disease. 5. Other chronic findings, including aortic atherosclerosis, are stable from prior CT. Electronically Signed   By: Lajean Manes M.D.   On: 05/20/2018 16:18   Dg Bone Density  Result Date:  05/12/2018 EXAM: DUAL X-RAY ABSORPTIOMETRY (DXA) FOR BONE MINERAL DENSITY IMPRESSION: Technologist: SCE PATIENT BIOGRAPHICAL: Name: Reality, Dejonge Patient ID: [id number] Birth Date: [birth year] Height: 60.0 in. Gender: Female Exam Date: 05/12/2018 Weight: 132.5 lbs. Indications: Advanced Age, Caucasian, Height Loss, History of Chemo, History of Endometrial Cancer, History of Radiation, Hypothyroid, Hysterectomy, Oophorectomy Bilateral, Postmenopausal Fractures: Treatments: Levothyroxine ASSESSMENT: The BMD measured at Forearm Radius 33% is 0.534 g/cm2 with a T-score of -3.9. This patient is considered osteoporotic according to [facility]) criteria. The quality of the scan was limited. Lumbar spine was not utilized due to surgrical hardware visible. Site Region Measured Measured WHO Young Adult BMD Date       Age      Classification T-score DualFemur Total Right 05/12/2018 85.8 Osteoporosis -3.0 0.628 g/cm2 Left Forearm Radius 33% 05/12/2018 85.8 Osteoporosis -3.9 0.534 g/cm2 World Health Organization [facility]) criteria for post-menopausal, Caucasian Women: Normal:       T-score at or above -  1 SD Osteopenia:   T-score between -1 and -2.5 SD Osteoporosis: T-score at or below -2.5 SD RECOMMENDATIONS: 1. All patients should optimize calcium and vitamin D intake. 2. Consider FDA-approved medical therapies in postmenopausal women and men aged 9 years and older, based on the following: a. A hip or vertebral(clinical or morphometric) fracture b. T-score < -2.5 at the femoral neck or spine after appropriate evaluation to exclude secondary causes c. Low bone mass (T-score between -1.0 and -2.5 at the femoral neck or spine) and a 10-year probability of a hip fracture > 3% or a 10-year probability of a major osteoporosis-related fracture > 20% based on the [place]-adapted WHO algorithm d. Clinician judgment and/or patient preferences may indicate treatment for people with 10-year fracture probabilities above or  below these levels FOLLOW-UP: People with diagnosed cases of osteoporosis or at high risk for fracture should have regular bone mineral density tests. For patients eligible for Medicare, routine testing is allowed once every 2 years. The testing frequency can be increased to one year for patients who have rapidly progressing disease, those who are receiving or discontinuing medical therapy to restore bone mass, or have additional risk factors. I have reviewed this report, and agree with the above findings. [facility] Radiology Electronically Signed   By: Lowella Grip III M.D.   On: 05/12/2018 09:27   Dg Abd 2 Views  Result Date: 05/20/2018 CLINICAL DATA:  LLQ pain getting worse since 1 week ago/ the pain in setting of recurrent endometerial cancerHysterectomy 2 years ago Constipation X days EXAM: ABDOMEN - 2 VIEW COMPARISON:  CT, 01/10/2018 FINDINGS: Normal bowel gas pattern.  No evidence of obstruction. On the initial supine image, there is relative lucency along the right lateral margin of the liver shadow with an apparent irregular contour of the right lateral liver margin. This is of unclear etiology. Stable well-positioned inferior vena cava filter. No evidence of renal or ureteral stones. Clear lung bases.  No acute skeletal abnormality. IMPRESSION: 1. No acute findings.  No evidence of bowel obstruction. 2. Abnormal appearing lucency along the right lateral margin of the liver of unclear etiology. This could potentially be artifact. This could be further assessed with CT. Electronically Signed   By: Lajean Manes M.D.   On: 05/20/2018 12:48    PERFORMANCE STATUS (ECOG) : 3 - Symptomatic, >50% confined to bed  REVIEW OF SYSTEMS: As noted above. Otherwise, a complete review of systems is negative.  PHYSICAL EXAM: General: NAD, frail appearing, thin Cardiovascular: regular rate and rhythm Pulmonary: clear ant fields Abdomen: soft, nontender, + bowel sounds GU: no suprapubic  tenderness Extremities: Bilateral lower extremity edema Skin: no rashes Neurological: Weakness but otherwise nonfocal  IMPRESSION: I met with patient, daughter, and granddaughter.  Introduced palliative care services and attempted to establish therapeutic rapport.  Patient describes a pattern of decline over the past month.  She has poor oral intake and has become progressively weaker.  Patient is now mostly in the chair or bed and only able to ambulate short distances with use of a walker and assistance from her daughter.    Her functioning is also limited by severe pain in the left groin.  Patient characterizes pain as being sharp with occasional radiation down to the left knee.  Pain is exacerbated by movement and often completely alleviated with rest.  Patient says she does not currently have pain while sitting in the wheelchair.  However, she is says pain would be 10 out of 10 if she  were to try to stand.  Patient was evaluated in the [facility] last week.  MS Contin was increased to 80m every 8 hours and oxycodone was increased to 10 mg every 4 hours for breakthrough pain.  Patient is unable to tell me definitively with what frequency she is taking her analgesia.  However, patient says that she is taking the oxycodone multiple times a day.  She says oxycodone is particularly helpful at reducing her pain but is inherently short-lived.  She says she feels the oxycodone takes about an hour to take effect and wears off after about 3 hours.  She says that she is sleeping at night but then wakes in the morning with intractable pain.  Patient denies any adverse effects from her pain medication.  PDMP reviewed.  Various options for adjusting her pain regimen were discussed.  Will increase MS Contin to 30 mg and change to every 12 hours in an attempt to simplify the regimen.  We will increase the frequency of oxycodone to 10 mg every 3 hours as needed for breakthrough pain.  Both patient and family say they  were comfortable with this regimen.  Patient says that she has had several days of loose stools.  She has had up to 10 stools a day.  She also describes discolored stools although it is not entirely clear to me what color she is reporting.  She says there might be a pink tinge and other times her stool appears pale.  I note that she has a low-grade fever in the clinic today.  Patient has not monitored her temperature at home.  Will check labs and stool studies.  I had a long discussion with patient regarding her goals.  We discussed at length the option of future work-up and treatment versus a transition to less aggressive measures including hospice.  Patient says that she would be interested in future treatment including chemotherapy if it were helpful in reducing her symptom burden.  Patient cites the last time she received chemotherapy and says that the pain improved after the initial dose.  We will schedule follow-up with Drs. Finnegan and SRaytheon  We discussed CODE STATUS and advance care planning.  Patient says she is undecided about decisions.  Patient says that she previously had a card in her wallet that said "do not pull the plug" but recognizes that aggressive care may not be in her best interest in the setting of advanced cancer.  She agrees to discuss this in more depth at time of a future visit.  Case and plan discussed with Dr. FGrayland Ormond  PLAN: -Visit scheduled with Dr. FGrayland Ormondand Dr. STheora Gianottito discuss treatment options -Increase MS Contin to 30 mg every 12 hours (patient to use two 155mtablets to equal 3078mose until current supply is exhausted) -Increase oxycodone to 10 mg every 3 hours as needed for breakthrough pain -CBC, C met, and stool studies -MOST Form to be completed at next visit -RTC in 2 weeks or sooner if needed   Patient expressed understanding and was in agreement with this plan. She also understands that She can call clinic at any time with any questions,  concerns, or complaints.     Time Total: 60 minutes  Visit consisted of counseling and education dealing with the complex and emotionally intense issues of symptom management and palliative care in the setting of serious and potentially life-threatening illness.Greater than 50%  of this time was spent counseling and coordinating care related to the above  assessment and plan.  Signed by: Altha Harm, PhD, NP-C [phone] (Work Cell)

## 2018-05-26 NOTE — Progress Notes (Signed)
Patient here today for initial palliative care visit. Patient reports uncontrolled pain. Patient also reports chills and night sweats, temp of 100.8 in clinic today.

## 2018-05-27 ENCOUNTER — Other Ambulatory Visit: Payer: Self-pay | Admitting: Hospice and Palliative Medicine

## 2018-05-27 MED ORDER — AMOXICILLIN-POT CLAVULANATE 875-125 MG PO TABS: 1.0000 | ORAL_TABLET | Freq: Two times a day (BID) | ORAL | 0 refills | Status: DC

## 2018-05-27 MED ORDER — ONDANSETRON HCL 8 MG PO TABS: 8.0000 mg | ORAL_TABLET | Freq: Three times a day (TID) | ORAL | 0 refills | Status: DC | PRN

## 2018-05-27 NOTE — Progress Notes (Signed)
Spoke with patient by phone. Together, we reviewed her labs. She says that the diarrhea has slowed. No bleeding reported. She does not think she is running a fever today. She did have some n/v overnight. Pain is moderately improved this AM on oxycodone. She plans to bring stool sample today.   Case and plan discussed Dr. Grayland Ormond.   Plan: Start Augmentin 875mg  Q12H x 10 days Ondansetron 8mg  PO Q8H PRN for nausea May add simethicone prn Patient to see Dr. Angelica Chessman next week May present to the ER over the weekend if symptoms worsen

## 2018-05-30 ENCOUNTER — Emergency Department: Payer: Medicare Other

## 2018-05-30 ENCOUNTER — Telehealth: Payer: Self-pay | Admitting: *Deleted

## 2018-05-30 ENCOUNTER — Inpatient Hospital Stay
Admission: EM | Admit: 2018-05-30 | Discharge: 2018-06-07 | DRG: 281 | Disposition: A | Discharge status: Skilled Nursing Facility | Payer: Medicare Other | Attending: Internal Medicine | Admitting: Internal Medicine

## 2018-05-30 ENCOUNTER — Encounter: Payer: Self-pay | Admitting: Emergency Medicine

## 2018-05-30 ENCOUNTER — Other Ambulatory Visit: Payer: Self-pay

## 2018-05-30 DIAGNOSIS — I38 Endocarditis, valve unspecified: Secondary | ICD-10-CM | POA: Diagnosis not present

## 2018-05-30 DIAGNOSIS — E785 Hyperlipidemia, unspecified: Secondary | ICD-10-CM | POA: Diagnosis present

## 2018-05-30 DIAGNOSIS — I110 Hypertensive heart disease with heart failure: Secondary | ICD-10-CM | POA: Diagnosis present

## 2018-05-30 DIAGNOSIS — R52 Pain, unspecified: Secondary | ICD-10-CM

## 2018-05-30 DIAGNOSIS — C577 Malignant neoplasm of other specified female genital organs: Secondary | ICD-10-CM | POA: Diagnosis not present

## 2018-05-30 DIAGNOSIS — G4700 Insomnia, unspecified: Secondary | ICD-10-CM | POA: Diagnosis present

## 2018-05-30 DIAGNOSIS — I252 Old myocardial infarction: Secondary | ICD-10-CM

## 2018-05-30 DIAGNOSIS — Q211 Atrial septal defect: Secondary | ICD-10-CM | POA: Diagnosis not present

## 2018-05-30 DIAGNOSIS — I255 Ischemic cardiomyopathy: Secondary | ICD-10-CM | POA: Diagnosis not present

## 2018-05-30 DIAGNOSIS — Z86711 Personal history of pulmonary embolism: Secondary | ICD-10-CM | POA: Diagnosis not present

## 2018-05-30 DIAGNOSIS — Z833 Family history of diabetes mellitus: Secondary | ICD-10-CM

## 2018-05-30 DIAGNOSIS — I214 Non-ST elevation (NSTEMI) myocardial infarction: Secondary | ICD-10-CM | POA: Diagnosis not present

## 2018-05-30 DIAGNOSIS — Z7901 Long term (current) use of anticoagulants: Secondary | ICD-10-CM

## 2018-05-30 DIAGNOSIS — M25552 Pain in left hip: Secondary | ICD-10-CM | POA: Diagnosis present

## 2018-05-30 DIAGNOSIS — R112 Nausea with vomiting, unspecified: Secondary | ICD-10-CM | POA: Diagnosis not present

## 2018-05-30 DIAGNOSIS — Z8673 Personal history of transient ischemic attack (TIA), and cerebral infarction without residual deficits: Secondary | ICD-10-CM

## 2018-05-30 DIAGNOSIS — K5909 Other constipation: Secondary | ICD-10-CM | POA: Diagnosis not present

## 2018-05-30 DIAGNOSIS — I10 Essential (primary) hypertension: Secondary | ICD-10-CM | POA: Diagnosis not present

## 2018-05-30 DIAGNOSIS — I5022 Chronic systolic (congestive) heart failure: Secondary | ICD-10-CM | POA: Diagnosis present

## 2018-05-30 DIAGNOSIS — M1611 Unilateral primary osteoarthritis, right hip: Secondary | ICD-10-CM | POA: Diagnosis not present

## 2018-05-30 DIAGNOSIS — R748 Abnormal levels of other serum enzymes: Secondary | ICD-10-CM | POA: Diagnosis not present

## 2018-05-30 DIAGNOSIS — Z8774 Personal history of (corrected) congenital malformations of heart and circulatory system: Secondary | ICD-10-CM | POA: Diagnosis not present

## 2018-05-30 DIAGNOSIS — C775 Secondary and unspecified malignant neoplasm of intrapelvic lymph nodes: Secondary | ICD-10-CM | POA: Diagnosis present

## 2018-05-30 DIAGNOSIS — Z66 Do not resuscitate: Secondary | ICD-10-CM | POA: Diagnosis not present

## 2018-05-30 DIAGNOSIS — Z86718 Personal history of other venous thrombosis and embolism: Secondary | ICD-10-CM | POA: Diagnosis not present

## 2018-05-30 DIAGNOSIS — F329 Major depressive disorder, single episode, unspecified: Secondary | ICD-10-CM | POA: Diagnosis present

## 2018-05-30 DIAGNOSIS — A419 Sepsis, unspecified organism: Secondary | ICD-10-CM | POA: Diagnosis not present

## 2018-05-30 DIAGNOSIS — Z823 Family history of stroke: Secondary | ICD-10-CM

## 2018-05-30 DIAGNOSIS — Z8249 Family history of ischemic heart disease and other diseases of the circulatory system: Secondary | ICD-10-CM

## 2018-05-30 DIAGNOSIS — Z90710 Acquired absence of both cervix and uterus: Secondary | ICD-10-CM | POA: Diagnosis not present

## 2018-05-30 DIAGNOSIS — R5081 Fever presenting with conditions classified elsewhere: Secondary | ICD-10-CM | POA: Diagnosis present

## 2018-05-30 DIAGNOSIS — E872 Acidosis: Secondary | ICD-10-CM | POA: Diagnosis present

## 2018-05-30 DIAGNOSIS — I330 Acute and subacute infective endocarditis: Secondary | ICD-10-CM | POA: Diagnosis not present

## 2018-05-30 DIAGNOSIS — R591 Generalized enlarged lymph nodes: Secondary | ICD-10-CM | POA: Diagnosis not present

## 2018-05-30 DIAGNOSIS — Z7401 Bed confinement status: Secondary | ICD-10-CM | POA: Diagnosis not present

## 2018-05-30 DIAGNOSIS — C562 Malignant neoplasm of left ovary: Secondary | ICD-10-CM

## 2018-05-30 DIAGNOSIS — Z90722 Acquired absence of ovaries, bilateral: Secondary | ICD-10-CM

## 2018-05-30 DIAGNOSIS — Z7983 Long term (current) use of bisphosphonates: Secondary | ICD-10-CM

## 2018-05-30 DIAGNOSIS — E876 Hypokalemia: Secondary | ICD-10-CM | POA: Diagnosis present

## 2018-05-30 DIAGNOSIS — I82509 Chronic embolism and thrombosis of unspecified deep veins of unspecified lower extremity: Secondary | ICD-10-CM | POA: Diagnosis not present

## 2018-05-30 DIAGNOSIS — R509 Fever, unspecified: Secondary | ICD-10-CM | POA: Diagnosis not present

## 2018-05-30 DIAGNOSIS — R1032 Left lower quadrant pain: Secondary | ICD-10-CM | POA: Diagnosis not present

## 2018-05-30 DIAGNOSIS — Z923 Personal history of irradiation: Secondary | ICD-10-CM

## 2018-05-30 DIAGNOSIS — Z515 Encounter for palliative care: Secondary | ICD-10-CM

## 2018-05-30 DIAGNOSIS — C541 Malignant neoplasm of endometrium: Secondary | ICD-10-CM | POA: Diagnosis present

## 2018-05-30 DIAGNOSIS — Z882 Allergy status to sulfonamides status: Secondary | ICD-10-CM

## 2018-05-30 DIAGNOSIS — D649 Anemia, unspecified: Secondary | ICD-10-CM | POA: Diagnosis present

## 2018-05-30 DIAGNOSIS — M6281 Muscle weakness (generalized): Secondary | ICD-10-CM | POA: Diagnosis not present

## 2018-05-30 DIAGNOSIS — N134 Hydroureter: Secondary | ICD-10-CM | POA: Diagnosis not present

## 2018-05-30 DIAGNOSIS — R531 Weakness: Secondary | ICD-10-CM | POA: Diagnosis not present

## 2018-05-30 DIAGNOSIS — M2550 Pain in unspecified joint: Secondary | ICD-10-CM | POA: Diagnosis not present

## 2018-05-30 DIAGNOSIS — Z960 Presence of urogenital implants: Secondary | ICD-10-CM | POA: Diagnosis present

## 2018-05-30 DIAGNOSIS — C569 Malignant neoplasm of unspecified ovary: Secondary | ICD-10-CM | POA: Diagnosis not present

## 2018-05-30 DIAGNOSIS — Z79890 Hormone replacement therapy (postmenopausal): Secondary | ICD-10-CM

## 2018-05-30 DIAGNOSIS — E569 Vitamin deficiency, unspecified: Secondary | ICD-10-CM | POA: Diagnosis not present

## 2018-05-30 DIAGNOSIS — R6520 Severe sepsis without septic shock: Secondary | ICD-10-CM | POA: Diagnosis not present

## 2018-05-30 DIAGNOSIS — N1330 Unspecified hydronephrosis: Secondary | ICD-10-CM | POA: Diagnosis present

## 2018-05-30 DIAGNOSIS — R197 Diarrhea, unspecified: Secondary | ICD-10-CM | POA: Diagnosis present

## 2018-05-30 DIAGNOSIS — Z888 Allergy status to other drugs, medicaments and biological substances status: Secondary | ICD-10-CM

## 2018-05-30 DIAGNOSIS — E039 Hypothyroidism, unspecified: Secondary | ICD-10-CM | POA: Diagnosis present

## 2018-05-30 DIAGNOSIS — G8929 Other chronic pain: Secondary | ICD-10-CM | POA: Diagnosis not present

## 2018-05-30 DIAGNOSIS — I509 Heart failure, unspecified: Secondary | ICD-10-CM | POA: Diagnosis not present

## 2018-05-30 DIAGNOSIS — I429 Cardiomyopathy, unspecified: Secondary | ICD-10-CM | POA: Diagnosis not present

## 2018-05-30 DIAGNOSIS — Z9221 Personal history of antineoplastic chemotherapy: Secondary | ICD-10-CM

## 2018-05-30 DIAGNOSIS — Z87891 Personal history of nicotine dependence: Secondary | ICD-10-CM

## 2018-05-30 DIAGNOSIS — M1612 Unilateral primary osteoarthritis, left hip: Secondary | ICD-10-CM | POA: Diagnosis not present

## 2018-05-30 DIAGNOSIS — G893 Neoplasm related pain (acute) (chronic): Secondary | ICD-10-CM | POA: Diagnosis not present

## 2018-05-30 DIAGNOSIS — I82502 Chronic embolism and thrombosis of unspecified deep veins of left lower extremity: Secondary | ICD-10-CM | POA: Diagnosis present

## 2018-05-30 DIAGNOSIS — Z8619 Personal history of other infectious and parasitic diseases: Secondary | ICD-10-CM | POA: Diagnosis not present

## 2018-05-30 DIAGNOSIS — I5020 Unspecified systolic (congestive) heart failure: Secondary | ICD-10-CM | POA: Diagnosis not present

## 2018-05-30 DIAGNOSIS — I25118 Atherosclerotic heart disease of native coronary artery with other forms of angina pectoris: Secondary | ICD-10-CM | POA: Diagnosis not present

## 2018-05-30 DIAGNOSIS — R0689 Other abnormalities of breathing: Secondary | ICD-10-CM | POA: Diagnosis not present

## 2018-05-30 DIAGNOSIS — Z95828 Presence of other vascular implants and grafts: Secondary | ICD-10-CM

## 2018-05-30 DIAGNOSIS — R42 Dizziness and giddiness: Secondary | ICD-10-CM | POA: Diagnosis not present

## 2018-05-30 DIAGNOSIS — E875 Hyperkalemia: Secondary | ICD-10-CM | POA: Diagnosis not present

## 2018-05-30 DIAGNOSIS — R0902 Hypoxemia: Secondary | ICD-10-CM | POA: Diagnosis not present

## 2018-05-30 DIAGNOSIS — Z8349 Family history of other endocrine, nutritional and metabolic diseases: Secondary | ICD-10-CM

## 2018-05-30 DIAGNOSIS — I959 Hypotension, unspecified: Secondary | ICD-10-CM | POA: Diagnosis not present

## 2018-05-30 DIAGNOSIS — Z79899 Other long term (current) drug therapy: Secondary | ICD-10-CM

## 2018-05-30 LAB — URINALYSIS, COMPLETE (UACMP) WITH MICROSCOPIC
BACTERIA UA: NONE SEEN
Bilirubin Urine: NEGATIVE
GLUCOSE, UA: NEGATIVE mg/dL
KETONES UR: NEGATIVE mg/dL
Leukocytes,Ua: NEGATIVE
Nitrite: NEGATIVE
Protein, ur: 30 mg/dL — AB
Specific Gravity, Urine: 1.013 (ref 1.005–1.030)
pH: 6 (ref 5.0–8.0)

## 2018-05-30 LAB — CBC
HEMATOCRIT: 28.1 % — AB (ref 36.0–46.0)
HEMOGLOBIN: 8.7 g/dL — AB (ref 12.0–15.0)
MCH: 27.1 pg (ref 26.0–34.0)
MCHC: 31 g/dL (ref 30.0–36.0)
MCV: 87.5 fL (ref 80.0–100.0)
Platelets: 397 10*3/uL (ref 150–400)
RBC: 3.21 MIL/uL — ABNORMAL LOW (ref 3.87–5.11)
RDW: 13.4 % (ref 11.5–15.5)
WBC: 10.8 10*3/uL — ABNORMAL HIGH (ref 4.0–10.5)
nRBC: 0 % (ref 0.0–0.2)

## 2018-05-30 LAB — GASTROINTESTINAL PANEL BY PCR, STOOL (REPLACES STOOL CULTURE)

## 2018-05-30 LAB — TROPONIN I: Troponin I: 7.86 ng/mL (ref <0.03)

## 2018-05-30 LAB — HEPATIC FUNCTION PANEL
ALT: 20 U/L (ref 0–44)
AST: 47 U/L — ABNORMAL HIGH (ref 15–41)
Albumin: 2.2 g/dL — ABNORMAL LOW (ref 3.5–5.0)
Alkaline Phosphatase: 73 U/L (ref 38–126)
Bilirubin, Direct: 0.2 mg/dL (ref 0.0–0.2)
Indirect Bilirubin: 0.5 mg/dL (ref 0.3–0.9)
Total Bilirubin: 0.7 mg/dL (ref 0.3–1.2)
Total Protein: 6.4 g/dL — ABNORMAL LOW (ref 6.5–8.1)

## 2018-05-30 LAB — BASIC METABOLIC PANEL
Anion gap: 13 (ref 5–15)
BUN: 14 mg/dL (ref 8–23)
CO2: 24 mmol/L (ref 22–32)
Calcium: 7.9 mg/dL — ABNORMAL LOW (ref 8.9–10.3)
Chloride: 99 mmol/L (ref 98–111)
Creatinine, Ser: 0.81 mg/dL (ref 0.44–1.00)
GFR calc Af Amer: >60 mL/min (ref >60)
GFR calc non Af Amer: >60 mL/min (ref >60)
Glucose, Bld: 131 mg/dL — ABNORMAL HIGH (ref 70–99)
POTASSIUM: 2.8 mmol/L — AB (ref 3.5–5.1)
Sodium: 136 mmol/L (ref 135–145)

## 2018-05-30 LAB — C DIFFICILE QUICK SCREEN W PCR REFLEX
C DIFFICILE (CDIFF) INTERP: NOT DETECTED
C Diff antigen: NEGATIVE
C Diff toxin: NEGATIVE

## 2018-05-30 LAB — INFLUENZA PANEL BY PCR (TYPE A & B)
Influenza A By PCR: NEGATIVE
Influenza B By PCR: NEGATIVE

## 2018-05-30 LAB — LACTIC ACID, PLASMA
Lactic Acid, Venous: 7 mmol/L (ref 0.5–1.9)
Lactic Acid, Venous: 2.4 mmol/L (ref 0.5–1.9)

## 2018-05-30 LAB — MRSA PCR SCREENING: MRSA by PCR: NEGATIVE

## 2018-05-30 MED ORDER — SODIUM CHLORIDE 0.9 % IV SOLN
INTRAVENOUS | Status: DC
  Administered 2018-05-30: 19:00:00 via INTRAVENOUS

## 2018-05-30 MED ORDER — ACETAMINOPHEN 650 MG RE SUPP: 650.0000 mg | Freq: Four times a day (QID) | RECTAL | Status: DC | PRN

## 2018-05-30 MED ORDER — OXYCODONE HCL 5 MG PO TABS
10.0000 mg | ORAL_TABLET | ORAL | Status: DC | PRN
  Administered 2018-05-30 – 2018-06-03 (×11): 10 mg via ORAL
  Filled 2018-05-30 (×11): qty 2

## 2018-05-30 MED ORDER — FENTANYL CITRATE (PF) 100 MCG/2ML IJ SOLN
50.0000 ug | INTRAMUSCULAR | Status: DC | PRN
  Administered 2018-05-30 – 2018-05-31 (×3): 50 ug via INTRAVENOUS
  Filled 2018-05-30 (×3): qty 2

## 2018-05-30 MED ORDER — ONDANSETRON HCL 4 MG/2ML IJ SOLN
INTRAMUSCULAR | Status: AC
  Filled 2018-05-30: qty 2

## 2018-05-30 MED ORDER — ACETAMINOPHEN 500 MG PO TABS
1000.0000 mg | ORAL_TABLET | Freq: Once | ORAL | Status: AC
  Administered 2018-05-30: 1000 mg via ORAL

## 2018-05-30 MED ORDER — POTASSIUM CHLORIDE 10 MEQ/100ML IV SOLN
10.0000 meq | INTRAVENOUS | Status: AC
  Administered 2018-05-30 (×2): 10 meq via INTRAVENOUS
  Filled 2018-05-30 (×2): qty 100

## 2018-05-30 MED ORDER — POTASSIUM CHLORIDE CRYS ER 20 MEQ PO TBCR
40.0000 meq | EXTENDED_RELEASE_TABLET | Freq: Once | ORAL | Status: AC
  Administered 2018-05-30: 40 meq via ORAL

## 2018-05-30 MED ORDER — METRONIDAZOLE IN NACL 5-0.79 MG/ML-% IV SOLN
500.0000 mg | Freq: Once | INTRAVENOUS | Status: AC
  Administered 2018-05-30: 500 mg via INTRAVENOUS
  Filled 2018-05-30: qty 100

## 2018-05-30 MED ORDER — VANCOMYCIN HCL IN DEXTROSE 750-5 MG/150ML-% IV SOLN
750.0000 mg | INTRAVENOUS | Status: DC
  Administered 2018-05-31 – 2018-06-02 (×2): 750 mg via INTRAVENOUS
  Filled 2018-05-30 (×4): qty 150

## 2018-05-30 MED ORDER — ONDANSETRON HCL 4 MG/2ML IJ SOLN: 4.0000 mg | Freq: Four times a day (QID) | INTRAMUSCULAR | Status: DC | PRN

## 2018-05-30 MED ORDER — ONDANSETRON HCL 4 MG/2ML IJ SOLN
4.0000 mg | Freq: Once | INTRAMUSCULAR | Status: AC
  Administered 2018-05-30: 4 mg via INTRAVENOUS

## 2018-05-30 MED ORDER — ALENDRONATE SODIUM 70 MG PO TABS: 70.0000 mg | ORAL_TABLET | ORAL | Status: DC

## 2018-05-30 MED ORDER — CALCIUM CARBONATE-VITAMIN D 500-200 MG-UNIT PO TABS
1.0000 | ORAL_TABLET | Freq: Every day | ORAL | Status: DC
  Administered 2018-06-01 – 2018-06-07 (×6): 1 via ORAL
  Filled 2018-05-30 (×6): qty 1

## 2018-05-30 MED ORDER — IOHEXOL 300 MG/ML  SOLN
75.0000 mL | Freq: Once | INTRAMUSCULAR | Status: AC | PRN
  Administered 2018-05-30: 75 mL via INTRAVENOUS

## 2018-05-30 MED ORDER — SODIUM CHLORIDE 0.9 % IV SOLN
2.0000 g | INTRAVENOUS | Status: DC
  Administered 2018-05-31 – 2018-06-02 (×3): 2 g via INTRAVENOUS
  Filled 2018-05-30 (×5): qty 2

## 2018-05-30 MED ORDER — ACETAMINOPHEN 500 MG PO TABS
ORAL_TABLET | ORAL | Status: AC
  Filled 2018-05-30: qty 2

## 2018-05-30 MED ORDER — LEVOTHYROXINE SODIUM 50 MCG PO TABS
75.0000 ug | ORAL_TABLET | Freq: Every day | ORAL | Status: DC
  Administered 2018-05-31: 75 ug via ORAL
  Filled 2018-05-30: qty 2

## 2018-05-30 MED ORDER — SODIUM CHLORIDE 0.9 % IV BOLUS
1000.0000 mL | Freq: Once | INTRAVENOUS | Status: AC
  Administered 2018-05-30: 1000 mL via INTRAVENOUS

## 2018-05-30 MED ORDER — VANCOMYCIN HCL IN DEXTROSE 1-5 GM/200ML-% IV SOLN
1000.0000 mg | Freq: Once | INTRAVENOUS | Status: AC
  Administered 2018-05-30: 1000 mg via INTRAVENOUS
  Filled 2018-05-30: qty 200

## 2018-05-30 MED ORDER — SODIUM CHLORIDE 0.9 % IV BOLUS
500.0000 mL | Freq: Once | INTRAVENOUS | Status: AC
  Administered 2018-05-30: 500 mL via INTRAVENOUS

## 2018-05-30 MED ORDER — ACETAMINOPHEN 325 MG PO TABS
650.0000 mg | ORAL_TABLET | Freq: Four times a day (QID) | ORAL | Status: DC | PRN
  Administered 2018-06-02 – 2018-06-03 (×2): 650 mg via ORAL
  Filled 2018-05-30 (×2): qty 2

## 2018-05-30 MED ORDER — SODIUM CHLORIDE 0.9 % IV SOLN
2.0000 g | Freq: Once | INTRAVENOUS | Status: AC
  Administered 2018-05-30: 2 g via INTRAVENOUS
  Filled 2018-05-30: qty 2

## 2018-05-30 MED ORDER — POTASSIUM CHLORIDE 20 MEQ PO PACK
40.0000 meq | PACK | Freq: Once | ORAL | Status: AC
  Administered 2018-05-30: 40 meq via ORAL
  Filled 2018-05-30: qty 2

## 2018-05-30 MED ORDER — ONDANSETRON HCL 4 MG PO TABS: 4.0000 mg | ORAL_TABLET | Freq: Four times a day (QID) | ORAL | Status: DC | PRN

## 2018-05-30 MED ORDER — APIXABAN 5 MG PO TABS
10.0000 mg | ORAL_TABLET | Freq: Two times a day (BID) | ORAL | Status: DC
  Administered 2018-05-30: 10 mg via ORAL
  Filled 2018-05-30: qty 2

## 2018-05-30 MED ORDER — NOREPINEPHRINE BITARTRATE 1 MG/ML IV SOLN
0.0000 ug/min | INTRAVENOUS | Status: DC
  Filled 2018-05-30: qty 4

## 2018-05-30 MED ORDER — SENNOSIDES-DOCUSATE SODIUM 8.6-50 MG PO TABS: 1.0000 | ORAL_TABLET | Freq: Every evening | ORAL | Status: DC | PRN

## 2018-05-30 MED ORDER — NOREPINEPHRINE 4 MG/250ML-% IV SOLN
0.0000 ug/min | INTRAVENOUS | Status: DC
  Filled 2018-05-30: qty 250

## 2018-05-30 NOTE — ED Notes (Signed)
ED TO INPATIENT HANDOFF REPORT  ED Nurse Name and Phone #: Karena Addison [phone]  S Name/Age/Gender Vickie Barton 85 y.o. female Room/Bed: ED08A/ED08A  Code Status   Code Status: Prior  Home/SNF/Other Home Patient oriented to: self, place, time and situation Is this baseline? Yes   Triage Complete: Triage complete  Chief Complaint Weakness, fever  Triage Note Pt in via EMS from home with c/o NV, fever, and weakness for the past few days. EMS reports pt with ovarian cancer and has had a decrease in her PO intake over the last few days. EMS reports pt did take her oxycodone shortly prior to coming with no relief. EMS reports FSBS 190, HR90, 94%RA, #18 gauge to left AC, patient given 591ml of fluid en route.    Allergies Allergies  Allergen Reactions  . Sulfa Antibiotics Rash  . Zinc Rash    Level of Care/Admitting Diagnosis ED Disposition    ED Disposition Condition [facility] Area: [place] [[id number]]  Level of Care: Stepdown [14]  Diagnosis: Sepsis [facility]) [[id number]]  Admitting Physician: Saundra Shelling [[id number]]  Attending Physician: Saundra Shelling [[id number]]  Estimated length of stay: past midnight tomorrow  Certification:: I certify this patient will need inpatient services for at least 2 midnights  PT Class (Do Not Modify): Inpatient [101]  PT Acc Code (Do Not Modify): Private [1]       B Medical/Surgery History Past Medical History:  Diagnosis Date  . Arthritis   . CHF (congestive heart failure) ([place])   . Constipation   . DVT (deep venous thrombosis) ([place]) 03/2017   left leg  . Endometrial cancer ([place]) 01/28/2016   Hysterectomy, chemo + rad tx's, also internal brachytherapy on 02/2018.   [name] Hyperlipidemia   . Hypertension   . Hypothyroidism    Past Surgical History:  Procedure Laterality Date  . CYSTOSCOPY W/ RETROGRADES Left 01/01/2017   Procedure: CYSTOSCOPY WITH RETROGRADE PYELOGRAM;  Surgeon: Nickie Retort, MD;   Location: [facility] ORS;  Service: Urology;  Laterality: Left;  . CYSTOSCOPY W/ RETROGRADES Left 04/23/2017   Procedure: CYSTOSCOPY WITH RETROGRADE PYELOGRAM;  Surgeon: Abbie Sons, MD;  Location: [facility] ORS;  Service: Urology;  Laterality: Left;  . CYSTOSCOPY W/ URETERAL STENT PLACEMENT Left 04/23/2017   Procedure: CYSTOSCOPY WITH STENT REMOVAL;  Surgeon: Abbie Sons, MD;  Location: [facility] ORS;  Service: Urology;  Laterality: Left;  . CYSTOSCOPY WITH STENT PLACEMENT Left 01/01/2017   Procedure: CYSTOSCOPY WITH STENT PLACEMENT;  Surgeon: Nickie Retort, MD;  Location: [facility] ORS;  Service: Urology;  Laterality: Left;  . DILATION AND CURETTAGE OF UTERUS    . HYSTEROSCOPY W/D&C N/A 01/28/2016   Procedure: DILATATION AND CURETTAGE /HYSTEROSCOPY;  Surgeon: [name], MD;  Location: [facility] ORS;  Service: Gynecology;  Laterality: N/A;  . LAPAROSCOPIC BILATERAL SALPINGO OOPHERECTOMY Bilateral 02/26/2016   Procedure: LAPAROSCOPIC BILATERAL SALPINGO OOPHORECTOMY;  Surgeon: [name], MD;  Location: [facility] OR[facility];  Service: Gynecology;  Laterality: Bilateral;  . LAPAROSCOPIC HYSTERECTOMY N/A 02/26/2016   Procedure: HYSTERECTOMY TOTAL LAPAROSCOPIC;  Surgeon: [name], MD;  Location: [facility] ORS;  Service: Gynecology;  Laterality: N/A;  . SENTINEL NODE BIOPSY N/A 02/26/2016   Procedure: SENTINEL NODE BIOPSY;  Surgeon: [name], MD;  Location: [facility] ORS;  Service: Gynecology;  Laterality: N/A;  . TONSILLECTOMY       A IV Location/Drains/Wounds Patient Lines/Drains/Airways Status   Active Line/Drains/Airways    Name:   Placement date:  Placement time:   Site:   Days:   Peripheral IV 05/30/18 Right Wrist   05/30/18    1248    Wrist   less than 1   Peripheral IV 05/30/18 Left Antecubital   05/30/18    1234    Antecubital   less than 1   Ureteral Drain/Stent Left ureter 6 Fr.   01/01/17    1058    Left ureter   514   Incision (Closed) 01/01/17 Perineum Other (Comment)   01/01/17    1057     514    Incision (Closed) 04/23/17 Vagina   04/23/17    0900     402          Intake/Output Last 24 hours  Intake/Output Summary (Last 24 hours) at 05/30/2018 1802 Last data filed at 05/30/2018 1437 Gross per 24 hour  Intake 2100 ml  Output -  Net 2100 ml    Labs/Imaging Results for orders placed or performed during the hospital encounter of 05/30/18 (from the past 48 hour(s))  Basic metabolic panel     Status: Abnormal   Collection Time: 05/30/18 11:34 AM  Result Value Ref Range   Sodium 136 135 - 145 mmol/L   Potassium 2.8 (L) 3.5 - 5.1 mmol/L   Chloride 99 98 - 111 mmol/L   CO2 24 22 - 32 mmol/L   Glucose, Bld 131 (H) 70 - 99 mg/dL   BUN 14 8 - 23 mg/dL   Creatinine, Ser 0.81 0.44 - 1.00 mg/dL   Calcium 7.9 (L) 8.9 - 10.3 mg/dL   GFR calc non Af Amer >60 >60 mL/min   GFR calc Af Amer >60 >60 mL/min   Anion gap 13 5 - 15    Comment: Performed at [facility], [address] [place], [place] [address]  CBC     Status: Abnormal   Collection Time: 05/30/18 11:34 AM  Result Value Ref Range   WBC 10.8 (H) 4.0 - 10.5 K/uL   RBC 3.21 (L) 3.87 - 5.11 MIL/uL   Hemoglobin 8.7 (L) 12.0 - 15.0 g/dL   HCT 28.1 (L) 36.0 - 46.0 %   MCV 87.5 80.0 - 100.0 fL   MCH 27.1 26.0 - 34.0 pg   MCHC 31.0 30.0 - 36.0 g/dL   RDW 13.4 11.5 - 15.5 %   Platelets 397 150 - 400 K/uL   nRBC 0.0 0.0 - 0.2 %    Comment: Performed at [facility], [address] [place], [place] [address]  Hepatic function panel     Status: Abnormal   Collection Time: 05/30/18 11:34 AM  Result Value Ref Range   Total Protein 6.4 (L) 6.5 - 8.1 g/dL   Albumin 2.2 (L) 3.5 - 5.0 g/dL   AST 47 (H) 15 - 41 U/L   ALT 20 0 - 44 U/L   Alkaline Phosphatase 73 38 - 126 U/L   Total Bilirubin 0.7 0.3 - 1.2 mg/dL   Bilirubin, Direct 0.2 0.0 - 0.2 mg/dL   Indirect Bilirubin 0.5 0.3 - 0.9 mg/dL    Comment: Performed at [facility], [address] [place], [place] [address]  Urinalysis, Complete w  Microscopic     Status: Abnormal   Collection Time: 05/30/18 12:31 PM  Result Value Ref Range   Color, Urine YELLOW (A) YELLOW   APPearance CLEAR (A) CLEAR   Specific Gravity, Urine 1.013 1.005 - 1.030   pH 6.0 5.0 - 8.0   Glucose,  UA NEGATIVE NEGATIVE mg/dL   Hgb urine dipstick MODERATE (A) NEGATIVE   Bilirubin Urine NEGATIVE NEGATIVE   Ketones, ur NEGATIVE NEGATIVE mg/dL   Protein, ur 30 (A) NEGATIVE mg/dL   Nitrite NEGATIVE NEGATIVE   Leukocytes,Ua NEGATIVE NEGATIVE   RBC / HPF 6-10 0 - 5 RBC/hpf   WBC, UA 0-5 0 - 5 WBC/hpf   Bacteria, UA NONE SEEN NONE SEEN   Squamous Epithelial / LPF 0-5 0 - 5   Mucus PRESENT    Ca Oxalate Crys, UA PRESENT     Comment: Performed at [facility], [address] [place], [place] [address]  Lactic acid, plasma     Status: Abnormal   Collection Time: 05/30/18 12:32 PM  Result Value Ref Range   Lactic Acid, Venous 7.0 (HH) 0.5 - 1.9 mmol/L    Comment: CRITICAL RESULT CALLED TO, READ BACK BY AND VERIFIED WITH DEE MCCLEAN AT 1323 ON 05/30/2018 JJB Performed at [facility], [address] [place], [place] [address]   Gastrointestinal Panel by PCR , Stool     Status: None   Collection Time: 05/30/18 12:52 PM  Result Value Ref Range   Campylobacter species NOT DETECTED NOT DETECTED   Plesimonas shigelloides NOT DETECTED NOT DETECTED   Salmonella species NOT DETECTED NOT DETECTED   Yersinia enterocolitica NOT DETECTED NOT DETECTED   Vibrio species NOT DETECTED NOT DETECTED   Vibrio cholerae NOT DETECTED NOT DETECTED   Enteroaggregative E coli (EAEC) NOT DETECTED NOT DETECTED   Enteropathogenic E coli (EPEC) NOT DETECTED NOT DETECTED   Enterotoxigenic E coli (ETEC) NOT DETECTED NOT DETECTED   Shiga like toxin producing E coli (STEC) NOT DETECTED NOT DETECTED   Shigella/Enteroinvasive E coli (EIEC) NOT DETECTED NOT DETECTED   Cryptosporidium NOT DETECTED NOT DETECTED   Cyclospora cayetanensis NOT DETECTED NOT DETECTED    Entamoeba histolytica NOT DETECTED NOT DETECTED   Giardia lamblia NOT DETECTED NOT DETECTED   Adenovirus F40/41 NOT DETECTED NOT DETECTED   Astrovirus NOT DETECTED NOT DETECTED   Norovirus GI/GII NOT DETECTED NOT DETECTED   Rotavirus A NOT DETECTED NOT DETECTED   Sapovirus (I, II, IV, and V) NOT DETECTED NOT DETECTED    Comment: Performed at [facility], [address] [place], [place] [address]  C difficile quick scan w PCR reflex     Status: None   Collection Time: 05/30/18 12:52 PM  Result Value Ref Range   C Diff antigen NEGATIVE NEGATIVE   C Diff toxin NEGATIVE NEGATIVE   C Diff interpretation No C. difficile detected.     Comment: Performed at [facility], [address] [place],  [address]  Influenza panel by PCR (type A & B)     Status: None   Collection Time: 05/30/18  3:20 PM  Result Value Ref Range   Influenza A By PCR NEGATIVE NEGATIVE   Influenza B By PCR NEGATIVE NEGATIVE    Comment: (NOTE) The Xpert Xpress Flu assay is intended as an aid in the diagnosis of  influenza and should not be used as a sole basis for treatment.  This  assay is FDA approved for nasopharyngeal swab specimens only. Nasal  washings and aspirates are unacceptable for Xpert Xpress Flu testing. Performed at [facility], [address] [place],  [address]   Lactic acid, plasma     Status: Abnormal   Collection Time: 05/30/18  4:07 PM  Result Value Ref Range   Lactic Acid, Venous  2.4 (HH) 0.5 - 1.9 mmol/L    Comment: CRITICAL RESULT CALLED TO, READ BACK BY AND VERIFIED WITH DEE Yazaira Speas @1649  ON 05/30/2018 BY FMW Performed at [facility], [address] [place], [place] [address]    Ct Abdomen Pelvis W Contrast  Result Date: 05/30/2018 CLINICAL DATA:  Acute generalized abdominal pain. EXAM: CT ABDOMEN AND PELVIS WITH CONTRAST TECHNIQUE: Multidetector CT imaging of the abdomen and pelvis was performed using the standard protocol  following bolus administration of intravenous contrast. CONTRAST:  31mL OMNIPAQUE IOHEXOL 300 MG/ML  SOLN COMPARISON:  CT scan of May 20, 2018. FINDINGS: Lower chest: No acute abnormality. Hepatobiliary: No focal liver abnormality is seen. No gallstones, gallbladder wall thickening, or biliary dilatation. Pancreas: Unremarkable. No pancreatic ductal dilatation or surrounding inflammatory changes. Spleen: Stable lucency seen in the spleen most consistent with hemangioma or cyst. No other splenic abnormality seen. Adrenals/Urinary Tract: Adrenal glands appear normal. Stable right renal cysts are noted. Minimal left hydronephrosis is noted but there is no evidence of obstructing calculus. No definite evidence of ureteral compression is noted. Urinary bladder is unremarkable. Stomach/Bowel: The stomach appears normal. There is no evidence of bowel obstruction or inflammation. The appendix appears unremarkable. Vascular/Lymphatic: Atherosclerosis of abdominal aorta is noted without aneurysm formation. Reproductive: Status post hysterectomy and bilateral oophorectomy. 6.9 x 4.6 cm mass is noted in left side of pelvis and left iliac fossa consistent with ovarian malignancy or metastatic adenopathy. This is slightly enlarged compared to prior exam. Other: No abdominal wall hernia or abnormality. No abdominopelvic ascites. Musculoskeletal: No acute or significant osseous findings. IMPRESSION: 6.9 x 4.6 cm mass is noted in left pelvic sidewall and left iliac fossa consistent with ovarian malignancy or metastatic adenopathy. This is slightly enlarged compared to prior exam. Minimal left hydronephrosis is noted which is slightly improved compared to prior exam. No ureteral calculus or definite evidence of external compression is noted. Aortic Atherosclerosis (ICD10-I70.0). Electronically Signed   By: Marijo Conception, M.D.   On: 05/30/2018 14:46   Dg Chest Port 1 View  Result Date: 05/30/2018 CLINICAL DATA:  Sepsis.   Ovarian cancer. EXAM: PORTABLE CHEST 1 VIEW COMPARISON:  11/04/2016 FINDINGS: The heart size and mediastinal contours are within normal limits. Both lungs are clear. The visualized skeletal structures are unremarkable. IMPRESSION: No active disease. Electronically Signed   By: Lorriane Shire M.D.   On: 05/30/2018 13:32    Pending Labs Unresulted Labs (From admission, onward)    Start     Ordered   05/30/18 1239  Troponin I - ONCE - STAT  ONCE - STAT,   STAT     05/30/18 1238   05/30/18 1223  Lactic acid, plasma  STAT Now then every 3 hours,   STAT     05/30/18 1223   05/30/18 1223  Comprehensive metabolic panel  ONCE - STAT,   STAT     05/30/18 1223   05/30/18 1223  Blood Culture (routine x 2)  BLOOD CULTURE X 2,   STAT     05/30/18 1223   05/30/18 1223  Urine culture  ONCE - STAT,   STAT     05/30/18 1223   Signed and Held  Basic metabolic panel  Tomorrow morning,   R     Signed and Held   Signed and Held  CBC  Tomorrow morning,   R     Signed and Held          Vitals/Pain Today's Vitals  05/30/18 1713 05/30/18 1714 05/30/18 1745 05/30/18 1759  BP: (!) 83/52  (!) 78/52 (!) 88/57  Pulse:    67  Resp: (!) 25  (!) 23 (!) 28  Temp:  98.6 F (37 C)    TempSrc:  Oral    SpO2:    96%  Weight:      Height:      PainSc:        Isolation Precautions Droplet precaution  Medications Medications  fentaNYL (SUBLIMAZE) injection 50 mcg (50 mcg Intravenous Given 05/30/18 1335)  vancomycin (VANCOCIN) IVPB 750 mg/150 ml premix (has no administration in time range)  ceFEPIme (MAXIPIME) 2 g in sodium chloride 0.9 % 100 mL IVPB (has no administration in time range)  norepinephrine (LEVOPHED) 4mg  in 243mL premix infusion (has no administration in time range)  sodium chloride 0.9 % bolus 1,000 mL (0 mLs Intravenous Stopped 05/30/18 1325)  ceFEPIme (MAXIPIME) 2 g in sodium chloride 0.9 % 100 mL IVPB (0 g Intravenous Stopped 05/30/18 1325)  metroNIDAZOLE (FLAGYL) IVPB 500 mg (0 mg Intravenous  Stopped 05/30/18 1437)  vancomycin (VANCOCIN) IVPB 1000 mg/200 mL premix (0 mg Intravenous Stopped 05/30/18 1518)  acetaminophen (TYLENOL) tablet 1,000 mg ( Oral Canceled Entry 05/30/18 1608)  ondansetron (ZOFRAN) injection 4 mg ( Intravenous Canceled Entry 05/30/18 1608)  sodium chloride 0.9 % bolus 1,000 mL (0 mLs Intravenous Stopped 05/30/18 1437)  iohexol (OMNIPAQUE) 300 MG/ML solution 75 mL (75 mLs Intravenous Contrast Given 05/30/18 1352)  sodium chloride 0.9 % bolus 500 mL (0 mLs Intravenous Stopped 05/30/18 1613)  potassium chloride SA (K-DUR,KLOR-CON) CR tablet 40 mEq (40 mEq Oral Given 05/30/18 1526)  sodium chloride 0.9 % bolus 500 mL (500 mLs Intravenous New Bag/Given 05/30/18 1757)    Mobility walks with device Moderate fall risk   Focused Assessments    R Recommendations: See Admitting Provider Note  Report given to: Lovena Le RN

## 2018-05-30 NOTE — Progress Notes (Signed)
Advanced care plan. Purpose of the Encounter: CODE STATUS Parties in Attendance: Patient and family Patient's Decision Capacity: Good Subjective/Patient's story: Adelaide Pfefferkorn  is a 85 y.o. female with a known history of DVT in the left leg, endometrial cancer, status post chemotherapy and radiation therapy, hypertension, hypothyroidism, chronic congestive heart failure presented to the emergency room for generalized weakness, nausea and vomiting and diarrhea.  Patient had some abdominal pain which is aching in nature 5 out of 10 on a scale of 1-10.  Objective/Medical story Patient was worked up with CT abdomen in the emergency room.  Lactic acid is elevated.  Patient started on broad-spectrum antibiotics and IV fluids for sepsis.  Needs inpatient evaluation and oncology follow-up. Goals of care determination:  Advance care directives goals of care and treatment plan discussed with the patient.   Patient has history of ovarian and endometrial cancer and completed chemoradiation.  For now they want full resuscitation. CODE STATUS: Full code Time spent discussing advanced care planning: 16 minutes

## 2018-05-30 NOTE — ED Triage Notes (Signed)
Pt in via EMS from home with c/o NV, fever, and weakness for the past few days. EMS reports pt with ovarian cancer and has had a decrease in her PO intake over the last few days. EMS reports pt did take her oxycodone shortly prior to coming with no relief. EMS reports FSBS 190, HR90, 94%RA, #18 gauge to left AC, patient given 516ml of fluid en route.

## 2018-05-30 NOTE — Progress Notes (Signed)
Pharmacy Antibiotic Note  Vickie Barton is a 85 y.o. female admitted on 05/30/2018 with sepsis.  Pharmacy has been consulted for vancomycin and Cefepime  dosing.  Plan: Vancomycin 1gm IV once given in ED Vancomycin 750 mg IV Q 24 hrs. Goal AUC 400-550. Expected AUC: 494.1 SCr used: 0.8  Cefepime 2gm q24h   Height: 5\' 1"  (154.9 cm) Weight: 127 lb (57.6 kg) IBW/kg (Calculated) : 47.8  Temp (24hrs), Avg:101.1 F (38.4 C), Min:98.9 F (37.2 C), Max:103.3 F (39.6 C)  Recent Labs  Lab 05/26/18 1544 05/30/18 1134 05/30/18 1232  WBC 11.2* 10.8*  --   CREATININE 0.79 0.81  --   LATICACIDVEN  --   --  7.0*    Estimated Creatinine Clearance: 41.4 mL/min (by C-G formula based on SCr of 0.81 mg/dL).    Allergies  Allergen Reactions  . Sulfa Antibiotics Rash  . Zinc Rash    Antimicrobials this admission: 3/9 Vancomycin >>  3/9  Cefepime >>  3/9  Metronidazole >>  Microbiology results: 3/9 BCx: Pending   Thank you for allowing pharmacy to be a part of this patient's care.  Khoury Siemon 05/30/2018 3:55 PM

## 2018-05-30 NOTE — Progress Notes (Signed)
CODE SEPSIS - PHARMACY COMMUNICATION  **Broad Spectrum Antibiotics should be administered within 1 hour of Sepsis diagnosis**  Time Code Sepsis Called/Page Received: 12:23   Antibiotics Ordered: Cefepime                                      Vancomycin  Time of 1st antibiotic administration: 12:47  Additional action taken by pharmacy: N/A  If necessary, Name of Provider/Nurse Contacted: N/A    Eston Mould ,PharmD Clinical Pharmacist  05/30/2018  2:04 PM

## 2018-05-30 NOTE — H&P (Signed)
[facility] [place] NAME: Vickie Barton    MR#:  [id number]  DATE OF BIRTH:  1930-08-01  DATE OF ADMISSION:  05/30/2018  PRIMARY CARE PHYSICIAN: [name], MD   REQUESTING/REFERRING PHYSICIAN:   CHIEF COMPLAINT:   Chief Complaint  Patient presents with  . Fever  . Weakness  . Nausea  . Emesis    HISTORY OF PRESENT ILLNESS: Vickie Barton  is a 85 y.o. female with a known history of DVT in the left leg, endometrial cancer, status post chemotherapy and radiation therapy, hypertension, hypothyroidism, chronic congestive heart failure presented to the emergency room for generalized weakness, nausea and vomiting and diarrhea.  Patient had some abdominal pain which is aching in nature 5 out of 10 on a scale of 1-10.  Stool for C. difficile testing was negative.  Lactic acid was elevated.  Patient's potassium was also low when she presented to the emergency room.  She also had fever of 103.3 F.  Code sepsis was called and patient was started on broad-spectrum antibiotics.  Hospitalist service was consulted.  CT abdomen reviewed shows ovarian malignancy with no evidence of obstruction. PAST MEDICAL HISTORY:   Past Medical History:  Diagnosis Date  . Arthritis   . CHF (congestive heart failure) ([place])   . Constipation   . DVT (deep venous thrombosis) ([place]) 03/2017   left leg  . Endometrial cancer ([place]) 01/28/2016   Hysterectomy, chemo + rad tx's, also internal brachytherapy on 02/2018.   [name] Hyperlipidemia   . Hypertension   . Hypothyroidism     PAST SURGICAL HISTORY:  Past Surgical History:  Procedure Laterality Date  . CYSTOSCOPY W/ RETROGRADES Left 01/01/2017   Procedure: CYSTOSCOPY WITH RETROGRADE PYELOGRAM;  Surgeon: Nickie Retort, MD;  Location: [facility];  Service: Urology;  Laterality: Left;  . CYSTOSCOPY W/ RETROGRADES Left 04/23/2017   Procedure: CYSTOSCOPY WITH RETROGRADE PYELOGRAM;  Surgeon: Abbie Sons, MD;   Location: [facility];  Service: Urology;  Laterality: Left;  . CYSTOSCOPY W/ URETERAL STENT PLACEMENT Left 04/23/2017   Procedure: CYSTOSCOPY WITH STENT REMOVAL;  Surgeon: Abbie Sons, MD;  Location: [facility];  Service: Urology;  Laterality: Left;  . CYSTOSCOPY WITH STENT PLACEMENT Left 01/01/2017   Procedure: CYSTOSCOPY WITH STENT PLACEMENT;  Surgeon: Nickie Retort, MD;  Location: [facility];  Service: Urology;  Laterality: Left;  . DILATION AND CURETTAGE OF UTERUS    . HYSTEROSCOPY W/D&C N/A 01/28/2016   Procedure: DILATATION AND CURETTAGE /HYSTEROSCOPY;  Surgeon: [name], MD;  Location: [facility];  Service: Gynecology;  Laterality: N/A;  . LAPAROSCOPIC BILATERAL SALPINGO OOPHERECTOMY Bilateral 02/26/2016   Procedure: LAPAROSCOPIC BILATERAL SALPINGO OOPHORECTOMY;  Surgeon: [name], MD;  Location: [facility];  Service: Gynecology;  Laterality: Bilateral;  . LAPAROSCOPIC HYSTERECTOMY N/A 02/26/2016   Procedure: HYSTERECTOMY TOTAL LAPAROSCOPIC;  Surgeon: [name], MD;  Location: [facility];  Service: Gynecology;  Laterality: N/A;  . SENTINEL NODE BIOPSY N/A 02/26/2016   Procedure: SENTINEL NODE BIOPSY;  Surgeon: [name], MD;  Location: [facility];  Service: Gynecology;  Laterality: N/A;  . TONSILLECTOMY      SOCIAL HISTORY:  Social History   Tobacco Use  . Smoking status: Former Research scientist (life sciences)  . Smokeless tobacco: Never Used  Substance Use Topics  . Alcohol use: No    FAMILY HISTORY:  Family History  Problem Relation Age of Onset  . Diabetes Father   . Heart disease Father   .  Hypertension Father   . Stroke Father   . Diabetes Paternal Grandmother   . Heart disease Mother   . Hypertension Mother   . Stroke Mother   . Hypertension Sister   . Thyroid disease Sister   . Hypertension Brother     DRUG ALLERGIES:  Allergies  Allergen Reactions  . Sulfa Antibiotics Rash  . Zinc Rash    REVIEW OF SYSTEMS:   CONSTITUTIONAL: Had fever, fatigue and weakness.   EYES: No blurred or double vision.  EARS, NOSE, AND THROAT: No tinnitus or ear pain.  RESPIRATORY: No cough, shortness of breath, wheezing or hemoptysis.  CARDIOVASCULAR: No chest pain, orthopnea, edema.  GASTROINTESTINAL: Has nausea, vomiting, diarrhea  has abdominal pain.  GENITOURINARY: No dysuria, hematuria.  ENDOCRINE: No polyuria, nocturia,  HEMATOLOGY: No anemia, easy bruising or bleeding SKIN: No rash or lesion. MUSCULOSKELETAL: No joint pain or arthritis.   NEUROLOGIC: No tingling, numbness, weakness.  PSYCHIATRY: No anxiety or depression.   MEDICATIONS AT HOME:  Prior to Admission medications   Medication Sig Start Date End Date Taking? Authorizing Provider  alendronate (FOSAMAX) 70 MG tablet Take 1 tablet (70 mg total) by mouth once a week. Take with a full glass of water on an empty stomach. 05/13/18  Yes [name], MD  amoxicillin-clavulanate (AUGMENTIN) 875-125 MG tablet Take 1 tablet by mouth 2 (two) times daily. 05/27/18  Yes Borders, [name], NP  Calcium Carbonate-Vitamin D (CALCIUM-D) 600-400 MG-UNIT TABS Take 1 tablet by mouth 1 day or 1 dose for 1 dose. 05/13/18 05/30/18 Yes Finnegan, Kathlene November, MD  ELIQUIS 5 MG TABS tablet TAKE 2 TABLETS BY MOUTH TWICE A DAY 05/02/18  Yes [name], MD  furosemide (LASIX) 20 MG tablet Take 20 mg by mouth 2 (two) times daily. 12/05/16  Yes [provider]  levothyroxine (SYNTHROID, LEVOTHROID) 150 MCG tablet TAKE 150 MCG BY MOUTH DAILY BEFORE BREAKFAST ON EMPTY STOMACH WITH A GLASS OF WATER 30-60MINS BEFORE BREAKFAST 03/19/17  Yes [provider]  ondansetron (ZOFRAN) 8 MG tablet Take 1 tablet (8 mg total) by mouth every 8 (eight) hours as needed for nausea or vomiting. 05/27/18  Yes Borders, [name], NP  Oxycodone HCl 10 MG TABS Take 1 tablet (10 mg total) by mouth every 3 (three) hours as needed (for breakthrough pain). 05/26/18  Yes Borders, [name], NP      PHYSICAL EXAMINATION:   VITAL SIGNS: Blood  pressure (!) 100/57, pulse 87, temperature (!) 103.3 F (39.6 C), resp. rate 17, height 5\' 1"  (1.549 m), weight 57.6 kg, SpO2 98 %.  GENERAL:  85 y.o.-year-old patient lying in the bed with no acute distress.  EYES: Pupils equal, round, reactive to light and accommodation. No scleral icterus. Extraocular muscles intact.  HEENT: Head atraumatic, normocephalic. Oropharynx and nasopharynx clear.  NECK:  Supple, no jugular venous distention. No thyroid enlargement, no tenderness.  LUNGS: Normal breath sounds bilaterally, no wheezing, rales,rhonchi or crepitation. No use of accessory muscles of respiration.  CARDIOVASCULAR: S1, S2 normal. No murmurs, rubs, or gallops.  ABDOMEN: Soft, mild tenderness around umbilicus, nondistended. Bowel sounds present. No organomegaly or mass.  EXTREMITIES: No pedal edema, cyanosis, or clubbing.  NEUROLOGIC: Cranial nerves II through XII are intact. Muscle strength 5/5 in all extremities. Sensation intact. Gait not checked.  PSYCHIATRIC: The patient is alert and oriented x 3.  SKIN: No obvious rash, lesion, or ulcer.   LABORATORY PANEL:   CBC Recent Labs  Lab 05/26/18 1544 05/30/18  1134  WBC 11.2* 10.8*  HGB 9.3* 8.7*  HCT 29.5* 28.1*  PLT 500* 397  MCV 87.3 87.5  MCH 27.5 27.1  MCHC 31.5 31.0  RDW 13.3 13.4  LYMPHSABS 0.8  --   MONOABS 0.7  --   EOSABS 0.0  --   BASOSABS 0.0  --    ------------------------------------------------------------------------------------------------------------------  Chemistries  Recent Labs  Lab 05/26/18 1544 05/30/18 1134  NA 133* 136  K 3.6 2.8*  CL 95* 99  CO2 27 24  GLUCOSE 108* 131*  BUN 13 14  CREATININE 0.79 0.81  CALCIUM 8.2* 7.9*  AST 29 47*  ALT 17 20  ALKPHOS 70 73  BILITOT 0.4 0.7   ------------------------------------------------------------------------------------------------------------------ estimated creatinine clearance is 41.4 mL/min (by C-G formula based on SCr of 0.81  mg/dL). ------------------------------------------------------------------------------------------------------------------ No results for input(s): TSH, T4TOTAL, T3FREE, THYROIDAB in the last 72 hours.  Invalid input(s): FREET3   Coagulation profile No results for input(s): INR, PROTIME in the last 168 hours. ------------------------------------------------------------------------------------------------------------------- No results for input(s): DDIMER in the last 72 hours. -------------------------------------------------------------------------------------------------------------------  Cardiac Enzymes No results for input(s): CKMB, TROPONINI, MYOGLOBIN in the last 168 hours.  Invalid input(s): CK ------------------------------------------------------------------------------------------------------------------ Invalid input(s): POCBNP  ---------------------------------------------------------------------------------------------------------------  Urinalysis    Component Value Date/Time   COLORURINE YELLOW (A) 05/30/2018 1231   APPEARANCEUR CLEAR (A) 05/30/2018 1231   APPEARANCEUR Clear 04/15/2017 1442   LABSPEC 1.013 05/30/2018 1231   PHURINE 6.0 05/30/2018 1231   GLUCOSEU NEGATIVE 05/30/2018 1231   HGBUR MODERATE (A) 05/30/2018 1231   BILIRUBINUR NEGATIVE 05/30/2018 1231   BILIRUBINUR Negative 04/15/2017 1442   KETONESUR NEGATIVE 05/30/2018 1231   PROTEINUR 30 (A) 05/30/2018 1231   NITRITE NEGATIVE 05/30/2018 1231   LEUKOCYTESUR NEGATIVE 05/30/2018 1231     RADIOLOGY: Ct Abdomen Pelvis W Contrast  Result Date: 05/30/2018 CLINICAL DATA:  Acute generalized abdominal pain. EXAM: CT ABDOMEN AND PELVIS WITH CONTRAST TECHNIQUE: Multidetector CT imaging of the abdomen and pelvis was performed using the standard protocol following bolus administration of intravenous contrast. CONTRAST:  65mL OMNIPAQUE IOHEXOL 300 MG/ML  SOLN COMPARISON:  CT scan of May 20, 2018. FINDINGS:  Lower chest: No acute abnormality. Hepatobiliary: No focal liver abnormality is seen. No gallstones, gallbladder wall thickening, or biliary dilatation. Pancreas: Unremarkable. No pancreatic ductal dilatation or surrounding inflammatory changes. Spleen: Stable lucency seen in the spleen most consistent with hemangioma or cyst. No other splenic abnormality seen. Adrenals/Urinary Tract: Adrenal glands appear normal. Stable right renal cysts are noted. Minimal left hydronephrosis is noted but there is no evidence of obstructing calculus. No definite evidence of ureteral compression is noted. Urinary bladder is unremarkable. Stomach/Bowel: The stomach appears normal. There is no evidence of bowel obstruction or inflammation. The appendix appears unremarkable. Vascular/Lymphatic: Atherosclerosis of abdominal aorta is noted without aneurysm formation. Reproductive: Status post hysterectomy and bilateral oophorectomy. 6.9 x 4.6 cm mass is noted in left side of pelvis and left iliac fossa consistent with ovarian malignancy or metastatic adenopathy. This is slightly enlarged compared to prior exam. Other: No abdominal wall hernia or abnormality. No abdominopelvic ascites. Musculoskeletal: No acute or significant osseous findings. IMPRESSION: 6.9 x 4.6 cm mass is noted in left pelvic sidewall and left iliac fossa consistent with ovarian malignancy or metastatic adenopathy. This is slightly enlarged compared to prior exam. Minimal left hydronephrosis is noted which is slightly improved compared to prior exam. No ureteral calculus or definite evidence of external compression is noted. Aortic Atherosclerosis (ICD10-I70.0). Electronically Signed   By: Sabino Dick  Brooke Bonito, M.D.   On: 05/30/2018 14:46   Dg Chest Port 1 View  Result Date: 05/30/2018 CLINICAL DATA:  Sepsis.  Ovarian cancer. EXAM: PORTABLE CHEST 1 VIEW COMPARISON:  11/04/2016 FINDINGS: The heart size and mediastinal contours are within normal limits. Both lungs are  clear. The visualized skeletal structures are unremarkable. IMPRESSION: No active disease. Electronically Signed   By: Lorriane Shire M.D.   On: 05/30/2018 13:32    EKG: Orders placed or performed during the hospital encounter of 05/30/18  . ED EKG  . ED EKG    IMPRESSION AND PLAN: 85 year old female patient with a known history of DVT in the left leg, endometrial cancer, status post chemotherapy and radiation therapy, hypertension, hypothyroidism, chronic congestive heart failure presented to the emergency room for generalized weakness, nausea and vomiting and diarrhea.  -Sepsis Unknown etiology Admit patient to medical floor Start patient on broad-spectrum IV antibiotics that is IV vancomycin and cefepime Check cultures Follow-up lactic acid level  -Acute hypokalemia Place potassium aggressively  -Acute diarrhea self-limiting C. difficile testing done in the ER is negative  -Endometrial and ovarian malignancy Oncology follow-up  -History of DVT left leg Continue Eliquis for anticoagulation  -DVT prophylaxis On anticoagulation with Eliquis  All the records are reviewed and case discussed with ED provider. Management plans discussed with the patient, family and they are in agreement.  CODE STATUS:Full code Code Status History    Date Active Date Inactive Code Status Order ID Comments User Context   10/25/2016 0053 10/26/2016 1601 Full Code [id number]  Saundra Shelling, MD Inpatient       TOTAL TIME TAKING CARE OF THIS PATIENT: 52 minutes.    Saundra Shelling M.D on 05/30/2018 at 3:59 PM  Between 7am to 6pm - Pager - [phone]  After 6pm go to [email or web address] - password [email or web address] [facility]  [facility]  Office  [phone]  CC: Primary care physician; [name], MD

## 2018-05-30 NOTE — ED Provider Notes (Signed)
[facility] Emergency Department Provider Note    First MD Initiated Contact with Patient 05/30/18 1148     (approximate)  I have reviewed the triage vital signs and the nursing notes.   HISTORY  Chief Complaint Fever; Weakness; Nausea; and Emesis    HPI Vickie Barton is a 85 y.o. female below listed past medical history presents the ER for evaluation of diarrhea fever for left lower quadrant pain.  States she is also having some chest pressure that started yesterday.  Having fevers and chills.  Was recently started on amoxicillin by hematology.   States she is having several episodes of nonbloody diarrhea since then.  Has had decreased oral intake due to severe abdominal pain.     Past Medical History:  Diagnosis Date  . Arthritis   . CHF (congestive heart failure) ([place])   . Constipation   . DVT (deep venous thrombosis) ([place]) 03/2017   left leg  . Endometrial cancer ([place]) 01/28/2016   Hysterectomy, chemo + rad tx's, also internal brachytherapy on 02/2018.   [name] Hyperlipidemia   . Hypertension   . Hypothyroidism    Family History  Problem Relation Age of Onset  . Diabetes Father   . Heart disease Father   . Hypertension Father   . Stroke Father   . Diabetes Paternal Grandmother   . Heart disease Mother   . Hypertension Mother   . Stroke Mother   . Hypertension Sister   . Thyroid disease Sister   . Hypertension Brother    Past Surgical History:  Procedure Laterality Date  . CYSTOSCOPY W/ RETROGRADES Left 01/01/2017   Procedure: CYSTOSCOPY WITH RETROGRADE PYELOGRAM;  Surgeon: Nickie Retort, MD;  Location: [facility] OR[facility];  Service: Urology;  Laterality: Left;  . CYSTOSCOPY W/ RETROGRADES Left 04/23/2017   Procedure: CYSTOSCOPY WITH RETROGRADE PYELOGRAM;  Surgeon: Abbie Sons, MD;  Location: [facility];  Service: Urology;  Laterality: Left;  . CYSTOSCOPY W/ URETERAL STENT PLACEMENT Left 04/23/2017   Procedure: CYSTOSCOPY WITH STENT REMOVAL;   Surgeon: Abbie Sons, MD;  Location: [facility];  Service: Urology;  Laterality: Left;  . CYSTOSCOPY WITH STENT PLACEMENT Left 01/01/2017   Procedure: CYSTOSCOPY WITH STENT PLACEMENT;  Surgeon: Nickie Retort, MD;  Location: [facility];  Service: Urology;  Laterality: Left;  . DILATION AND CURETTAGE OF UTERUS    . HYSTEROSCOPY W/D&C N/A 01/28/2016   Procedure: DILATATION AND CURETTAGE /HYSTEROSCOPY;  Surgeon: [name], MD;  Location: [facility];  Service: Gynecology;  Laterality: N/A;  . LAPAROSCOPIC BILATERAL SALPINGO OOPHERECTOMY Bilateral 02/26/2016   Procedure: LAPAROSCOPIC BILATERAL SALPINGO OOPHORECTOMY;  Surgeon: [name], MD;  Location: [facility];  Service: Gynecology;  Laterality: Bilateral;  . LAPAROSCOPIC HYSTERECTOMY N/A 02/26/2016   Procedure: HYSTERECTOMY TOTAL LAPAROSCOPIC;  Surgeon: [name], MD;  Location: [facility];  Service: Gynecology;  Laterality: N/A;  . SENTINEL NODE BIOPSY N/A 02/26/2016   Procedure: SENTINEL NODE BIOPSY;  Surgeon: [name], MD;  Location: [facility];  Service: Gynecology;  Laterality: N/A;  . TONSILLECTOMY     Patient Active Problem List   Diagnosis Date Noted  . History of DVT (deep vein thrombosis) 05/24/2018  . Pain of metastatic malignancy 05/20/2018  . Goals of care, counseling/discussion 01/02/2017  . CAP (community acquired pneumonia) 10/24/2016  . Endometrial cancer ([place]) 02/19/2016      Prior to Admission medications   Medication Sig Start Date End Date Taking? Authorizing Provider  alendronate (FOSAMAX) 70  MG tablet Take 1 tablet (70 mg total) by mouth once a week. Take with a full glass of water on an empty stomach. Patient not taking: Reported on 05/26/2018 05/13/18   [name], MD  amoxicillin-clavulanate (AUGMENTIN) 875-125 MG tablet Take 1 tablet by mouth 2 (two) times daily. 05/27/18   Borders, [name], NP  Calcium Carbonate-Vitamin D (CALCIUM-D) 600-400 MG-UNIT TABS Take 1 tablet by mouth 1 day or 1  dose for 1 dose. Patient not taking: Reported on 05/20/2018 05/13/18 05/14/18  [name], MD  ELIQUIS 5 MG TABS tablet TAKE 2 TABLETS BY MOUTH TWICE A DAY 05/02/18   [name], MD  furosemide (LASIX) 20 MG tablet Take 20 mg by mouth 2 (two) times daily. 12/05/16   [provider]  levothyroxine (SYNTHROID, LEVOTHROID) 150 MCG tablet TAKE 150 MCG BY MOUTH DAILY BEFORE BREAKFAST ON EMPTY STOMACH WITH A GLASS OF WATER 30-60MINS BEFORE BREAKFAST 03/19/17   [provider]  morphine (MS CONTIN) 30 MG 12 hr tablet Take 1 tablet (30 mg total) by mouth every 12 (twelve) hours. 05/26/18   Borders, [name], NP  ondansetron (ZOFRAN) 8 MG tablet Take 1 tablet (8 mg total) by mouth every 8 (eight) hours as needed for nausea or vomiting. 05/27/18   Borders, [name], NP  Oxycodone HCl 10 MG TABS Take 1 tablet (10 mg total) by mouth every 3 (three) hours as needed (for breakthrough pain). 05/26/18   Borders, [name], NP    Allergies Sulfa antibiotics and Zinc    Social History Social History   Tobacco Use  . Smoking status: Former Research scientist (life sciences)  . Smokeless tobacco: Never Used  Substance Use Topics  . Alcohol use: No  . Drug use: No    Review of Systems Patient denies headaches, rhinorrhea, blurry vision, numbness, shortness of breath, chest pain, edema, cough, abdominal pain, nausea, vomiting, diarrhea, dysuria, fevers, rashes or hallucinations unless otherwise stated above in HPI. ____________________________________________   PHYSICAL EXAM:  VITAL SIGNS: Vitals:   05/30/18 1330 05/30/18 1430  BP: (!) 100/57   Pulse: 79 87  Resp: 18 17  Temp:    SpO2: 94% 98%    Constitutional: Alert and oriented.  Eyes: Conjunctivae are normal.  Head: Atraumatic. Nose: No congestion/rhinnorhea. Mouth/Throat: Mucous membranes are moist.   Neck: No stridor. Painless ROM.  Cardiovascular:tachycardic, regular rhythm. Grossly normal heart sounds.  Good peripheral  circulation. Respiratory: Normal respiratory effort.  No retractions. Lungs CTAB. Gastrointestinal: Soft with tenderness to palpation left lower quadrant.  No hernia noted.  No distention. No abdominal bruits. No CVA tenderness. Genitourinary: deferred Musculoskeletal: No lower extremity tenderness nor edema.  No joint effusions. Neurologic:  Normal speech and language. No gross focal neurologic deficits are appreciated. No facial droop Skin:  Skin is warm, dry and intact. No rash noted. Psychiatric: Mood and affect are normal. Speech and behavior are normal.  ____________________________________________   LABS (all labs ordered are listed, but only abnormal results are displayed)  Results for orders placed or performed during the hospital encounter of 05/30/18 (from the past 24 hour(s))  Basic metabolic panel     Status: Abnormal   Collection Time: 05/30/18 11:34 AM  Result Value Ref Range   Sodium 136 135 - 145 mmol/L   Potassium 2.8 (L) 3.5 - 5.1 mmol/L   Chloride 99 98 - 111 mmol/L   CO2 24 22 - 32 mmol/L   Glucose, Bld 131 (H) 70 - 99 mg/dL  BUN 14 8 - 23 mg/dL   Creatinine, Ser 0.81 0.44 - 1.00 mg/dL   Calcium 7.9 (L) 8.9 - 10.3 mg/dL   GFR calc non Af Amer >60 >60 mL/min   GFR calc Af Amer >60 >60 mL/min   Anion gap 13 5 - 15  CBC     Status: Abnormal   Collection Time: 05/30/18 11:34 AM  Result Value Ref Range   WBC 10.8 (H) 4.0 - 10.5 K/uL   RBC 3.21 (L) 3.87 - 5.11 MIL/uL   Hemoglobin 8.7 (L) 12.0 - 15.0 g/dL   HCT 28.1 (L) 36.0 - 46.0 %   MCV 87.5 80.0 - 100.0 fL   MCH 27.1 26.0 - 34.0 pg   MCHC 31.0 30.0 - 36.0 g/dL   RDW 13.4 11.5 - 15.5 %   Platelets 397 150 - 400 K/uL   nRBC 0.0 0.0 - 0.2 %  Hepatic function panel     Status: Abnormal   Collection Time: 05/30/18 11:34 AM  Result Value Ref Range   Total Protein 6.4 (L) 6.5 - 8.1 g/dL   Albumin 2.2 (L) 3.5 - 5.0 g/dL   AST 47 (H) 15 - 41 U/L   ALT 20 0 - 44 U/L   Alkaline Phosphatase 73 38 - 126 U/L    Total Bilirubin 0.7 0.3 - 1.2 mg/dL   Bilirubin, Direct 0.2 0.0 - 0.2 mg/dL   Indirect Bilirubin 0.5 0.3 - 0.9 mg/dL  Urinalysis, Complete w Microscopic     Status: Abnormal   Collection Time: 05/30/18 12:31 PM  Result Value Ref Range   Color, Urine YELLOW (A) YELLOW   APPearance CLEAR (A) CLEAR   Specific Gravity, Urine 1.013 1.005 - 1.030   pH 6.0 5.0 - 8.0   Glucose, UA NEGATIVE NEGATIVE mg/dL   Hgb urine dipstick MODERATE (A) NEGATIVE   Bilirubin Urine NEGATIVE NEGATIVE   Ketones, ur NEGATIVE NEGATIVE mg/dL   Protein, ur 30 (A) NEGATIVE mg/dL   Nitrite NEGATIVE NEGATIVE   Leukocytes,Ua NEGATIVE NEGATIVE   RBC / HPF 6-10 0 - 5 RBC/hpf   WBC, UA 0-5 0 - 5 WBC/hpf   Bacteria, UA NONE SEEN NONE SEEN   Squamous Epithelial / LPF 0-5 0 - 5   Mucus PRESENT    Ca Oxalate Crys, UA PRESENT   Lactic acid, plasma     Status: Abnormal   Collection Time: 05/30/18 12:32 PM  Result Value Ref Range   Lactic Acid, Venous 7.0 (HH) 0.5 - 1.9 mmol/L  Gastrointestinal Panel by PCR , Stool     Status: None   Collection Time: 05/30/18 12:52 PM  Result Value Ref Range   Campylobacter species NOT DETECTED NOT DETECTED   Plesimonas shigelloides NOT DETECTED NOT DETECTED   Salmonella species NOT DETECTED NOT DETECTED   Yersinia enterocolitica NOT DETECTED NOT DETECTED   Vibrio species NOT DETECTED NOT DETECTED   Vibrio cholerae NOT DETECTED NOT DETECTED   Enteroaggregative E coli (EAEC) NOT DETECTED NOT DETECTED   Enteropathogenic E coli (EPEC) NOT DETECTED NOT DETECTED   Enterotoxigenic E coli (ETEC) NOT DETECTED NOT DETECTED   Shiga like toxin producing E coli (STEC) NOT DETECTED NOT DETECTED   Shigella/Enteroinvasive E coli (EIEC) NOT DETECTED NOT DETECTED   Cryptosporidium NOT DETECTED NOT DETECTED   Cyclospora cayetanensis NOT DETECTED NOT DETECTED   Entamoeba histolytica NOT DETECTED NOT DETECTED   Giardia lamblia NOT DETECTED NOT DETECTED   Adenovirus F40/41 NOT DETECTED NOT DETECTED    Astrovirus  NOT DETECTED NOT DETECTED   Norovirus GI/GII NOT DETECTED NOT DETECTED   Rotavirus A NOT DETECTED NOT DETECTED   Sapovirus (I, II, IV, and V) NOT DETECTED NOT DETECTED  C difficile quick scan w PCR reflex     Status: None   Collection Time: 05/30/18 12:52 PM  Result Value Ref Range   C Diff antigen NEGATIVE NEGATIVE   C Diff toxin NEGATIVE NEGATIVE   C Diff interpretation No C. difficile detected.    ____________________________________________  EKG My review and personal interpretation at Time: 11:35   Indication: chest pressure  Rate: 85  Rhythm: sinus Axis: normal Other: st elevations in inferior leads without reciprocal changes ____________________________________________  RADIOLOGY  I personally reviewed all radiographic images ordered to evaluate for the above acute complaints and reviewed radiology reports and findings.  These findings were personally discussed with the patient.  Please see medical record for radiology report.  ____________________________________________   PROCEDURES  Procedure(s) performed:  .Critical Care Performed by: Merlyn Lot, MD Authorized by: Merlyn Lot, MD   Critical care provider statement:    Critical care time (minutes):  30   Critical care time was exclusive of:  Separately billable procedures and treating other patients   Critical care was necessary to treat or prevent imminent or life-threatening deterioration of the following conditions:  Sepsis   Critical care was time spent personally by me on the following activities:  Development of treatment plan with patient or surrogate, discussions with consultants, evaluation of patient's response to treatment, examination of patient, obtaining history from patient or surrogate, ordering and performing treatments and interventions, ordering and review of laboratory studies, ordering and review of radiographic studies, pulse oximetry, re-evaluation of patient's condition  and review of old charts      Critical Care performed: yes ____________________________________________   INITIAL IMPRESSION / [place] / ED COURSE  Pertinent labs & imaging results that were available during my care of the patient were reviewed by me and considered in my medical decision making (see chart for details).   DDX: Pneumonia, ACS, dehydration, bacteremia, UTI, abscess, C. difficile, colitis  AREATHA KALATA is a 85 y.o. who presents to the ED with was as described above.  Blood work sent for above differential.  Will obtain EKG placed on cardiac monitor obtain IV blood work as well as give IV fluids and IV pain medication.  Clinical Course as of May 30 1502  Mon May 30, 2018  1224 EKG was repeated.  On exam patient denies any chest pain was having some pressure yesterday but denies any discomfort at this time is primarily complaining of left groin pain and diarrhea.  Does not show any reciprocal changes and patient is not presenting with chest pain but does meet septic criteria.  I discussed this case in consultation with cardiology, Dr.Rita.  Agrees with plan to further work-up for sepsis patient is already on Eliquis further complicating her presentation.  May be demand ischemia in the setting of tachycardia and sepsis.   [PR]  1316 Repeat exam the patient is denying any chest pressure at this time is still complaining of diarrheal illness and left groin pain.   [PR]  1320 Patient has been evaluated at bedside by Dr. Velva Harman who agrees with further work-up sepsis.  There is no reciprocal changes on EKG and she not having chest pain not consistent with STEMI or acute MI.  Has had multiple episodes of watery diarrhea and given recent antibiotics certainly concern  for C. difficile.  Her repeat abdominal exam is soft and benign.   [PR]  1326 Patient with profound lactic acidosis.  Will continue IV fluids.  Clinically she seems to improving.  Still waiting on LFTs as well  as GI panel.  We will also order CT imaging based on acuity of her condition.   [PR]  6244 CT imaging does not show any evidence of perforation but does show persistent malignancy with associated fluid.  Uncertain etiology of fever.  May be malignancy induced.  Will discuss with hospitalist for admission for further medical management.   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    Merlyn Lot, MD 05/30/18 1504

## 2018-05-30 NOTE — ED Notes (Signed)
Patient given pillow at this time

## 2018-05-30 NOTE — ED Notes (Signed)
Pt's stool was not bloody per prior RN.

## 2018-05-30 NOTE — Telephone Encounter (Signed)
I tried calling patient but did not reach her. Message left.

## 2018-05-30 NOTE — Telephone Encounter (Signed)
Patient called reporting that the Morphine prescribed is not being tolerated and she has stopped taking it. She reports that she cannot walk and does not know if she will even be able to come to her appointment Wed. She again is asking for something to be done about her pain and if she needs chemotherapy. She has gone back to the Oxycodone which doe snot completely control her pain. Please advise

## 2018-05-30 NOTE — Progress Notes (Signed)
PHARMACIST - PHYSICIAN COMMUNICATION  CONCERNING: P&T Medication Policy Regarding Oral Bisphosphonates  RECOMMENDATION: Your order for alendronate (Fosamax) has been discontinued at this time.  If the patient's post-hospital medical condition warrants safe use of this class of drugs, please resume the pre-hospital regimen upon discharge.  DESCRIPTION:  Alendronate (Fosamax) can cause severe esophageal erosions in patients who are unable to remain upright at least 30 minutes after taking this medication.   Since brief interruptions in therapy are thought to have minimal impact on bone mineral density, the Pharmacy & Therapeutics Committee has established that bisphosphonate orders should be routinely discontinued during hospitalization.      

## 2018-05-31 ENCOUNTER — Inpatient Hospital Stay (HOSPITAL_COMMUNITY)
Admission: EM | Admit: 2018-05-31 | Discharge: 2018-05-31 | Disposition: A | Discharge status: Home / Self Care | Payer: Medicare Other | Source: Home / Self Care | Attending: Adult Health | Admitting: Adult Health

## 2018-05-31 DIAGNOSIS — I38 Endocarditis, valve unspecified: Secondary | ICD-10-CM

## 2018-05-31 DIAGNOSIS — A419 Sepsis, unspecified organism: Secondary | ICD-10-CM

## 2018-05-31 DIAGNOSIS — Z86711 Personal history of pulmonary embolism: Secondary | ICD-10-CM

## 2018-05-31 DIAGNOSIS — I255 Ischemic cardiomyopathy: Secondary | ICD-10-CM

## 2018-05-31 DIAGNOSIS — R6520 Severe sepsis without septic shock: Secondary | ICD-10-CM

## 2018-05-31 DIAGNOSIS — Z7901 Long term (current) use of anticoagulants: Secondary | ICD-10-CM

## 2018-05-31 DIAGNOSIS — I429 Cardiomyopathy, unspecified: Secondary | ICD-10-CM

## 2018-05-31 DIAGNOSIS — C541 Malignant neoplasm of endometrium: Secondary | ICD-10-CM

## 2018-05-31 DIAGNOSIS — I214 Non-ST elevation (NSTEMI) myocardial infarction: Principal | ICD-10-CM

## 2018-05-31 DIAGNOSIS — Z86718 Personal history of other venous thrombosis and embolism: Secondary | ICD-10-CM

## 2018-05-31 DIAGNOSIS — I5020 Unspecified systolic (congestive) heart failure: Secondary | ICD-10-CM

## 2018-05-31 DIAGNOSIS — C562 Malignant neoplasm of left ovary: Secondary | ICD-10-CM

## 2018-05-31 DIAGNOSIS — G893 Neoplasm related pain (acute) (chronic): Secondary | ICD-10-CM

## 2018-05-31 DIAGNOSIS — R778 Other specified abnormalities of plasma proteins: Secondary | ICD-10-CM

## 2018-05-31 DIAGNOSIS — Z515 Encounter for palliative care: Secondary | ICD-10-CM

## 2018-05-31 DIAGNOSIS — R7989 Other specified abnormal findings of blood chemistry: Principal | ICD-10-CM

## 2018-05-31 LAB — BASIC METABOLIC PANEL
Anion gap: 9 (ref 5–15)
Anion gap: 7 (ref 5–15)
BUN: 15 mg/dL (ref 8–23)
BUN: 15 mg/dL (ref 8–23)
CO2: 20 mmol/L — ABNORMAL LOW (ref 22–32)
CO2: 20 mmol/L — ABNORMAL LOW (ref 22–32)
Calcium: 7.6 mg/dL — ABNORMAL LOW (ref 8.9–10.3)
Calcium: 7.8 mg/dL — ABNORMAL LOW (ref 8.9–10.3)
Chloride: 108 mmol/L (ref 98–111)
Chloride: 109 mmol/L (ref 98–111)
Creatinine, Ser: 0.75 mg/dL (ref 0.44–1.00)
Creatinine, Ser: 0.68 mg/dL (ref 0.44–1.00)
GFR calc Af Amer: >60 mL/min (ref >60)
GFR calc Af Amer: >60 mL/min (ref >60)
GFR calc non Af Amer: >60 mL/min (ref >60)
GFR calc non Af Amer: >60 mL/min (ref >60)
Glucose, Bld: 102 mg/dL — ABNORMAL HIGH (ref 70–99)
Glucose, Bld: 99 mg/dL (ref 70–99)
Potassium: 5.2 mmol/L — ABNORMAL HIGH (ref 3.5–5.1)
Potassium: 5.2 mmol/L — ABNORMAL HIGH (ref 3.5–5.1)
Sodium: 137 mmol/L (ref 135–145)
Sodium: 136 mmol/L (ref 135–145)

## 2018-05-31 LAB — PHOSPHORUS: Phosphorus: 2.7 mg/dL (ref 2.5–4.6)

## 2018-05-31 LAB — ECHOCARDIOGRAM COMPLETE
Height: 61 in
Weight: 2155.22 oz

## 2018-05-31 LAB — CBC
HCT: 24.9 % — ABNORMAL LOW (ref 36.0–46.0)
Hemoglobin: 7.6 g/dL — ABNORMAL LOW (ref 12.0–15.0)
MCH: 27.1 pg (ref 26.0–34.0)
MCHC: 30.5 g/dL (ref 30.0–36.0)
MCV: 88.9 fL (ref 80.0–100.0)
Platelets: 396 10*3/uL (ref 150–400)
RBC: 2.8 MIL/uL — ABNORMAL LOW (ref 3.87–5.11)
RDW: 13.9 % (ref 11.5–15.5)
WBC: 14.4 10*3/uL — ABNORMAL HIGH (ref 4.0–10.5)
nRBC: 0 % (ref 0.0–0.2)

## 2018-05-31 LAB — APTT
APTT: 53 s — AB (ref 24–36)
aPTT: 58 seconds — ABNORMAL HIGH (ref 24–36)
aPTT: 72 seconds — ABNORMAL HIGH (ref 24–36)

## 2018-05-31 LAB — URINE CULTURE: Culture: NO GROWTH

## 2018-05-31 LAB — GLUCOSE, CAPILLARY: GLUCOSE-CAPILLARY: 108 mg/dL — AB (ref 70–99)

## 2018-05-31 LAB — LACTIC ACID, PLASMA
Lactic Acid, Venous: 1.4 mmol/L (ref 0.5–1.9)
Lactic Acid, Venous: 1.1 mmol/L (ref 0.5–1.9)
Lactic Acid, Venous: 1 mmol/L (ref 0.5–1.9)

## 2018-05-31 LAB — TROPONIN I
Troponin I: 6.36 ng/mL (ref <0.03)
Troponin I: 5.76 ng/mL (ref <0.03)

## 2018-05-31 LAB — PROTIME-INR
INR: 3.2 — ABNORMAL HIGH (ref 0.8–1.2)
Prothrombin Time: 32.2 seconds — ABNORMAL HIGH (ref 11.4–15.2)

## 2018-05-31 LAB — HEPARIN LEVEL (UNFRACTIONATED): Heparin Unfractionated: >3.6 IU/mL — ABNORMAL HIGH (ref 0.30–0.70)

## 2018-05-31 LAB — MAGNESIUM: Magnesium: 1.8 mg/dL (ref 1.7–2.4)

## 2018-05-31 LAB — PROCALCITONIN: Procalcitonin: 39.33 ng/mL

## 2018-05-31 MED ORDER — HEPARIN BOLUS VIA INFUSION
900.0000 [IU] | Freq: Once | INTRAVENOUS | Status: AC
  Administered 2018-05-31: 900 [IU] via INTRAVENOUS
  Filled 2018-05-31: qty 900

## 2018-05-31 MED ORDER — ATORVASTATIN CALCIUM 10 MG PO TABS
10.0000 mg | ORAL_TABLET | Freq: Every day | ORAL | Status: DC
  Administered 2018-05-31 – 2018-06-06 (×7): 10 mg via ORAL
  Filled 2018-05-31 (×7): qty 1

## 2018-05-31 MED ORDER — PERFLUTREN LIPID MICROSPHERE
1.0000 mL | INTRAVENOUS | Status: AC | PRN
  Administered 2018-05-31: 2 mL via INTRAVENOUS
  Filled 2018-05-31: qty 10

## 2018-05-31 MED ORDER — MORPHINE SULFATE (PF) 2 MG/ML IV SOLN: 2.0000 mg | INTRAVENOUS | Status: DC | PRN

## 2018-05-31 MED ORDER — HEPARIN (PORCINE) 25000 UT/250ML-% IV SOLN
900.0000 [IU]/h | INTRAVENOUS | Status: DC
  Administered 2018-05-31: 700 [IU]/h via INTRAVENOUS
  Filled 2018-05-31: qty 250

## 2018-05-31 MED ORDER — ASPIRIN EC 81 MG PO TBEC
81.0000 mg | DELAYED_RELEASE_TABLET | Freq: Every day | ORAL | Status: DC
  Administered 2018-05-31 – 2018-06-07 (×8): 81 mg via ORAL
  Filled 2018-05-31 (×8): qty 1

## 2018-05-31 MED ORDER — METOPROLOL TARTRATE 25 MG PO TABS
12.5000 mg | ORAL_TABLET | Freq: Two times a day (BID) | ORAL | Status: DC
  Administered 2018-06-01 – 2018-06-07 (×10): 12.5 mg via ORAL
  Filled 2018-05-31 (×14): qty 1

## 2018-05-31 MED ORDER — LEVOTHYROXINE SODIUM 50 MCG PO TABS
150.0000 ug | ORAL_TABLET | Freq: Every day | ORAL | Status: DC
  Administered 2018-06-01 – 2018-06-07 (×7): 150 ug via ORAL
  Filled 2018-05-31 (×7): qty 1

## 2018-05-31 MED ORDER — LEVOTHYROXINE SODIUM 50 MCG PO TABS
75.0000 ug | ORAL_TABLET | Freq: Once | ORAL | Status: AC
  Administered 2018-05-31: 75 ug via ORAL
  Filled 2018-05-31: qty 2

## 2018-05-31 MED ORDER — SODIUM CHLORIDE 0.9 % IV SOLN
INTRAVENOUS | Status: DC
  Administered 2018-06-01: 01:00:00 via INTRAVENOUS

## 2018-05-31 MED ORDER — OXYCODONE HCL ER 15 MG PO T12A
15.0000 mg | EXTENDED_RELEASE_TABLET | Freq: Two times a day (BID) | ORAL | Status: DC
  Administered 2018-05-31 – 2018-06-03 (×5): 15 mg via ORAL
  Filled 2018-05-31 (×6): qty 1

## 2018-05-31 NOTE — Progress Notes (Signed)
Patient transferred to 2A from ICU via bed.  Report called and given to this RN. Patient oriented to room and surroundings. Family at bedside.  Call bell in reach.

## 2018-05-31 NOTE — Consult Note (Signed)
Cardiology Consultation:   Patient ID: [name] MRN: [id number]; DOB: 18-Feb-1931  Admit date: 05/30/2018 Date of Consult: 05/31/2018  Primary Care Provider: [name], MD Primary Cardiologist: Vickie Barton, Dr. Rockey Barton Primary Electrophysiologist:  None    Patient Profile:   Vickie Barton is a 85 y.o. female with a hx of HFrEF (EF ~30% 10/2016, s/p RHC 10/2016), b/l PE during 10/2016 admission, h/o IVC filter placement 11/06/2016, PFO per bubble study, DVT (LLE 10/2016) on Eliquis, HTN, HLD, h/o endometrial cancer (dx 2017) s/p hysterectomy and BSO /chemotherapy and radiation therapy, remote h/o smoking, OA, and hypothyroid who is being seen today for the evaluation of elevated troponin at the request of Dr. Posey Barton.  History of Present Illness:   Vickie Barton is an 85 yo female with PMH as above. Seen 10/2016 at [facility] with bilateral pulmonary emboli requiring catheter directed EKOS bilateral lysis. Also found to have extensive LLE DVT at that time, thought likely d/t extrinsic vessel compression from pelvic lymphadenopathy and metastatic endometrial cancer.  Plans were for  tPA and stent placement but patient was found to be bacteremic and hypotensive with Takotsubo's CM and new acute-subacute embolic strokes (and PFO also found that admission). RHC and TTE / bubble study performed as in EMR and below. Given this and the risk for hemorrhagic conversion if tpa was used, the patient declined thrombolysis and stenting and was instead managed on Heparin for anticoagulation. IVC filter was placed 11/06/2016. She was transitioned to apixaban prior to discharge and also completed abx for her bacteremia. She was placed on lasix for her new heart failure.   She presented to [facility] ED with c/o generalized weakness, nausea, abdominal pain, emesis, diarrhea, and fever with temperature 103.59F at presentation. Labs significant for WBC 10.8, Lactic acid 7.0, K 2.8, and troponin 7.86. CXR negative for acute  changes. CT concerning for ovarian malignancy without evidence of obstruction. Influenza negative, UA negative. Received IVF and abx given sepsis and admitted to stepdown unit with cardiology consulted for elevated troponin. At the time of cardiology consultation, she reported some upper epigastric discomfort the previous morning around ~9AM and associated with burping thus thought to be GI related at that time.  Past Medical History:  Diagnosis Date  . Arthritis   . CHF (congestive heart failure) ([place])   . Constipation   . DVT (deep venous thrombosis) ([place]) 03/2017   left leg  . Endometrial cancer ([place]) 01/28/2016   Hysterectomy, chemo + rad tx's, also internal brachytherapy on 02/2018.   [name] Hyperlipidemia   . Hypertension   . Hypothyroidism     Past Surgical History:  Procedure Laterality Date  . CYSTOSCOPY W/ RETROGRADES Left 01/01/2017   Procedure: CYSTOSCOPY WITH RETROGRADE PYELOGRAM;  Surgeon: Vickie Retort, MD;  Location: [facility] ORS;  Service: Urology;  Laterality: Left;  . CYSTOSCOPY W/ RETROGRADES Left 04/23/2017   Procedure: CYSTOSCOPY WITH RETROGRADE PYELOGRAM;  Surgeon: Vickie Sons, MD;  Location: [facility] ORS;  Service: Urology;  Laterality: Left;  . CYSTOSCOPY W/ URETERAL STENT PLACEMENT Left 04/23/2017   Procedure: CYSTOSCOPY WITH STENT REMOVAL;  Surgeon: Vickie Sons, MD;  Location: [facility] ORS;  Service: Urology;  Laterality: Left;  . CYSTOSCOPY WITH STENT PLACEMENT Left 01/01/2017   Procedure: CYSTOSCOPY WITH STENT PLACEMENT;  Surgeon: Vickie Retort, MD;  Location: [facility] ORS;  Service: Urology;  Laterality: Left;  . DILATION AND CURETTAGE OF UTERUS    . HYSTEROSCOPY W/D&C N/A 01/28/2016   Procedure: DILATATION AND CURETTAGE /HYSTEROSCOPY;  Surgeon: Vickie Loh Ward, MD;  Location: [facility] ORS;  Service: Gynecology;  Laterality: N/A;  . LAPAROSCOPIC BILATERAL SALPINGO OOPHERECTOMY Bilateral 02/26/2016   Procedure: LAPAROSCOPIC BILATERAL SALPINGO OOPHORECTOMY;  Surgeon:  Vickie Loh Ward, MD;  Location: [facility] ORS;  Service: Gynecology;  Laterality: Bilateral;  . LAPAROSCOPIC HYSTERECTOMY N/A 02/26/2016   Procedure: HYSTERECTOMY TOTAL LAPAROSCOPIC;  Surgeon: Vickie Loh Ward, MD;  Location: [facility] ORS;  Service: Gynecology;  Laterality: N/A;  . SENTINEL NODE BIOPSY N/A 02/26/2016   Procedure: SENTINEL NODE BIOPSY;  Surgeon: Vickie Loh Ward, MD;  Location: [facility] OR[facility];  Service: Gynecology;  Laterality: N/A;  . TONSILLECTOMY       Home Medications:  Prior to Admission medications   Medication Sig Start Date End Date Taking? Authorizing Provider  alendronate (FOSAMAX) 70 MG tablet Take 1 tablet (70 mg total) by mouth once a week. Take with a full glass of water on an empty stomach. 05/13/18  Yes Vickie Huger, MD  amoxicillin-clavulanate (AUGMENTIN) 875-125 MG tablet Take 1 tablet by mouth 2 (two) times daily. 05/27/18  Yes Vickie Barton, Vickie Boys, NP  Calcium Carbonate-Vitamin D (CALCIUM-D) 600-400 MG-UNIT TABS Take 1 tablet by mouth 1 day or 1 dose for 1 dose. 05/13/18 05/30/18 Yes Vickie Barton, Kathlene November, MD  ELIQUIS 5 MG TABS tablet TAKE 2 TABLETS BY MOUTH TWICE A DAY 05/02/18  Yes Vickie Huger, MD  furosemide (LASIX) 20 MG tablet Take 20 mg by mouth 2 (two) times daily. 12/05/16  Yes [provider]  levothyroxine (SYNTHROID, LEVOTHROID) 150 MCG tablet TAKE 150 MCG BY MOUTH DAILY BEFORE BREAKFAST ON EMPTY STOMACH WITH A GLASS OF WATER 30-60MINS BEFORE BREAKFAST 03/19/17  Yes [provider]  ondansetron (ZOFRAN) 8 MG tablet Take 1 tablet (8 mg total) by mouth every 8 (eight) hours as needed for nausea or vomiting. 05/27/18  Yes Vickie Barton, Vickie Boys, NP  Oxycodone HCl 10 MG TABS Take 1 tablet (10 mg total) by mouth every 3 (three) hours as needed (for breakthrough pain). 05/26/18  Yes Vickie Barton, Vickie Boys, NP    Inpatient Medications: Scheduled Meds: . calcium-vitamin D  1 tablet Oral Daily  . [START ON 06/01/2018] levothyroxine  150 mcg Oral Q0600   Continuous  Infusions: . sodium chloride 75 mL/hr at 05/31/18 0700  . ceFEPime (MAXIPIME) IV    . heparin 700 Units/hr (05/31/18 0700)  . norepinephrine (LEVOPHED) Adult infusion Stopped (05/30/18 1930)  . vancomycin     PRN Meds: acetaminophen **OR** acetaminophen, fentaNYL (SUBLIMAZE) injection, ondansetron **OR** ondansetron (ZOFRAN) IV, oxyCODONE, senna-docusate  Allergies:    Allergies  Allergen Reactions  . Sulfa Antibiotics Rash  . Zinc Rash    Social History:   Social History   Socioeconomic History  . Marital status: Widowed    Spouse name: Not on file  . Number of children: Not on file  . Years of education: Not on file  . Highest education level: Not on file  Occupational History  . Occupation: retired  Scientific laboratory technician  . Financial resource strain: Not on file  . Food insecurity:    Worry: Not on file    Inability: Not on file  . Transportation needs:    Medical: Not on file    Non-medical: Not on file  Tobacco Use  . Smoking status: Former Research scientist (life sciences)  . Smokeless tobacco: Never Used  Substance and Sexual Activity  . Alcohol use: No  . Drug use: No  . Sexual activity: Not on file  Lifestyle  . Physical activity:  Days per week: Not on file    Minutes per session: Not on file  . Stress: Not on file  Relationships  . Social connections:    Talks on phone: Not on file    Gets together: Not on file    Attends religious service: Not on file    Active member of club or organization: Not on file    Attends meetings of clubs or organizations: Not on file    Relationship status: Not on file  . Intimate partner violence:    Fear of current or ex partner: Not on file    Emotionally abused: Not on file    Physically abused: Not on file    Forced sexual activity: Not on file  Other Topics Concern  . Not on file  Social History Narrative  . Not on file    Family History:    Family History  Problem Relation Age of Onset  . Diabetes Father   . Heart disease Father     . Hypertension Father   . Stroke Father   . Diabetes Paternal Grandmother   . Heart disease Mother   . Hypertension Mother   . Stroke Mother   . Hypertension Sister   . Thyroid disease Sister   . Hypertension Brother      ROS:  Please see the history of present illness.   Review of Systems  Constitutional: Positive for fever and malaise/fatigue.       Fever at presentation, started on abx  Respiratory: Negative for cough and hemoptysis.   Cardiovascular: Negative for chest pain and palpitations.  Gastrointestinal: Positive for abdominal pain, diarrhea, heartburn, nausea and vomiting.       GI symptoms improved from yesterday  Musculoskeletal: Positive for myalgias.       Left femoral / upper leg pain, improving  Neurological: Positive for weakness. Negative for headaches.  Psychiatric/Behavioral: Negative for memory loss and substance abuse.       Previous smoker  All other systems reviewed and are negative.   All other ROS reviewed and negative.     Physical Exam/Data:   Vitals:   05/31/18 0400 05/31/18 0500 05/31/18 0600 05/31/18 0700  BP: 121/63 120/64 117/71 115/65  Pulse: 70 72 74 67  Resp: (!) 26 (!) 36 (!) 22 (!) 32  Temp: 99.7 F (37.6 C)     TempSrc: Oral     SpO2: 96% 95% 96% 95%  Weight:      Height:        Intake/Output Summary (Last 24 hours) at 05/31/2018 0920 Last data filed at 05/31/2018 0700 Gross per 24 hour  Intake 3233.24 ml  Output 150 ml  Net 3083.24 ml   Filed Weights   05/30/18 1131 05/30/18 1831  Weight: 57.6 kg 61.1 kg   Body mass index is 25.45 kg/m.  General:  Elderly female in NAD HEENT: normal Neck: no JVD Vascular: Radial pulses 2+ bilaterally   Cardiac:  Soft heart sounds, normal S1, S2; RRR; no murmur  Lungs:  clear to auscultation bilaterally, no wheezing, rhonchi or rales  Abd: soft, nontender, no hepatomegaly. ++Borborygmi   Ext: non-pitting bilateral lower extremity edema Musculoskeletal:  No deformities Skin:  warm and dry  Neuro:  no focal abnormalities noted Psych:  Normal affect   EKG: Significant artifact, NSR Telemetry:  Telemetry was personally reviewed and demonstrates:  SR, HR 70-80s  CV Studies:   Relevant CV Studies:  TTE 11/06/2016 INTERPRETATION --------------------------------------------------------------- SEVERE LV DYSFUNCTION (See above)  WITH MILD LVH NORMAL LA PRESSURES WITH DIASTOLIC DYSFUNCTION MILD RV SYSTOLIC DYSFUNCTION (See above) VALVULAR REGURGITATION: TRIVIAL MR, TRIVIAL PR, TRIVIAL TR NO VALVULAR STENOSIS TRIVIAL PERICARDIAL EFFUSION LARGE AREA OF LV APICAL AKINESIS: TAKOTSUBO CARDIOMYOPATHY VS LARGE INFARCT 3D acquisition and reconstructions were performed as part of this examination to more accurately quantify the effects of reduced left ventricular ejection fraction.  Compared with prior Echo study on 11/06/2016: LV FUNCTION HAS DECREASED  11/10/2016 RHC Impressions:  Patient with Pulmonary embolism and worsening left ventricular function brought to coronary angiogram evaluation  Left radial access  No significant obsttruction - diffuse tublar LAD disease  Recommendations:  Access out Continue medical therapy  Other Result Information  Interface, Text Results In - 11/10/2016  4:32 PM EDT Impressions:  Patient with Pulmonary embolism and worsening left ventricular function brought to coronary angiogram evaluation  Left radial access  No significant obsttruction - diffuse tublar LAD disease  Recommendations:  Access out Continue medical therapy     Laboratory Data:  Chemistry Recent Labs  Lab 05/30/18 1134 05/31/18 0106 05/31/18 0353  NA 136 137 136  K 2.8* 5.2* 5.2*  CL 99 108 109  CO2 24 20* 20*  GLUCOSE 131* 102* 99  BUN 14 15 15   CREATININE 0.81 0.75 0.68  CALCIUM 7.9* 7.6* 7.8*  GFRNONAA >60 >60 >60  GFRAA >60 >60 >60  ANIONGAP 13 9 7     Recent Labs  Lab 05/26/18 1544 05/30/18 1134   PROT 8.0 6.4*  ALBUMIN 3.0* 2.2*  AST 29 47*  ALT 17 20  ALKPHOS 70 73  BILITOT 0.4 0.7   Hematology Recent Labs  Lab 05/26/18 1544 05/30/18 1134 05/31/18 0353  WBC 11.2* 10.8* 14.4*  RBC 3.38* 3.21* 2.80*  HGB 9.3* 8.7* 7.6*  HCT 29.5* 28.1* 24.9*  MCV 87.3 87.5 88.9  MCH 27.5 27.1 27.1  MCHC 31.5 31.0 30.5  RDW 13.3 13.4 13.9  PLT 500* 397 396   Cardiac Enzymes Recent Labs  Lab 05/30/18 1848 05/31/18 0106 05/31/18 0633  TROPONINI 7.86* 6.36* 5.76*   No results for input(s): TROPIPOC in the last 168 hours.  BNPNo results for input(s): BNP, PROBNP in the last 168 hours.  DDimer No results for input(s): DDIMER in the last 168 hours.  Radiology/Studies:  Ct Abdomen Pelvis W Contrast  Result Date: 05/30/2018 CLINICAL DATA:  Acute generalized abdominal pain. EXAM: CT ABDOMEN AND PELVIS WITH CONTRAST TECHNIQUE: Multidetector CT imaging of the abdomen and pelvis was performed using the standard protocol following bolus administration of intravenous contrast. CONTRAST:  14mL OMNIPAQUE IOHEXOL 300 MG/ML  SOLN COMPARISON:  CT scan of May 20, 2018. FINDINGS: Lower chest: No acute abnormality. Hepatobiliary: No focal liver abnormality is seen. No gallstones, gallbladder wall thickening, or biliary dilatation. Pancreas: Unremarkable. No pancreatic ductal dilatation or surrounding inflammatory changes. Spleen: Stable lucency seen in the spleen most consistent with hemangioma or cyst. No other splenic abnormality seen. Adrenals/Urinary Tract: Adrenal glands appear normal. Stable right renal cysts are noted. Minimal left hydronephrosis is noted but there is no evidence of obstructing calculus. No definite evidence of ureteral compression is noted. Urinary bladder is unremarkable. Stomach/Bowel: The stomach appears normal. There is no evidence of bowel obstruction or inflammation. The appendix appears unremarkable. Vascular/Lymphatic: Atherosclerosis of abdominal aorta is noted without  aneurysm formation. Reproductive: Status post hysterectomy and bilateral oophorectomy. 6.9 x 4.6 cm mass is noted in left side of pelvis and left iliac fossa consistent with ovarian malignancy or metastatic adenopathy. This is  slightly enlarged compared to prior exam. Other: No abdominal wall hernia or abnormality. No abdominopelvic ascites. Musculoskeletal: No acute or significant osseous findings. IMPRESSION: 6.9 x 4.6 cm mass is noted in left pelvic sidewall and left iliac fossa consistent with ovarian malignancy or metastatic adenopathy. This is slightly enlarged compared to prior exam. Minimal left hydronephrosis is noted which is slightly improved compared to prior exam. No ureteral calculus or definite evidence of external compression is noted. Aortic Atherosclerosis (ICD10-I70.0). Electronically Signed   By: Marijo Conception, M.D.   On: 05/30/2018 14:46   Dg Chest Port 1 View  Result Date: 05/30/2018 CLINICAL DATA:  Sepsis.  Ovarian cancer. EXAM: PORTABLE CHEST 1 VIEW COMPARISON:  11/04/2016 FINDINGS: The heart size and mediastinal contours are within normal limits. Both lungs are clear. The visualized skeletal structures are unremarkable. IMPRESSION: No active disease. Electronically Signed   By: Lorriane Shire M.D.   On: 05/30/2018 13:32    Assessment and Plan:   Elevated troponin without chest pain - No current CP. Previous epigastric sx reported. Consider elevated troponin in the setting of infection. PTA medications included Augmentin. Echo this admission pending official read. Previous echo above. Per Dr. Rockey Barton, preliminary reading suspicious for valvular growth.  - Troponin elevated this admission in setting of sepsis. EKG without acute changes. - Continue to monitor. Ischemic workup deferred at this time in the setting of infection with concern for endocarditis. PTA medications included Eliquis. Continue heparin. Daily CBC. - Plan for TTE to r/o endocarditis. Further recommendations  following TTE.   HFrEF  - Relatively euvolemic on exam. Daily weights, strict I/O. Lasix held in the setting of sepsis. Caution with fluids given reduced EF with official read of echo pending as above.  Sepsis - Per CCM. Caution with fluids as above. R/o endocarditis as above. Daily BMET.   Anemia - Daily CBC. Hgb 7.6. On heparin. Recommendation for transfusion below 8.0. Caution. Monitor.  Hyperkalemia - Daily BMET. Monitor K 5.2.  H/o bilateral PE, LLE DVT - Continue heparin. As above, PTA Eliquis. Daily CBC  HTN - Stable. Daily vitals  HLD - Last LDL 158. Not on statin. Consider statin after resolution of current infection or as an outpatient.   Hypothyroid - Continue PTA synthroid  H/o endometrial CA - Seen by Palliative with note in chart for this admission   For questions or updates, please contact [place] Please consult [email or web address] for contact info under     Signed, Arvil Chaco, PA-C  05/31/2018 9:20 AM

## 2018-05-31 NOTE — Consult Note (Signed)
[place]  [phone] [phone]  ID: Marinda Elk Mcclarty OB: 02-27-1931  MR#: [id number]  YQI#:[id number]  Patient Care Team: [name], MD as PCP - General (Family Medicine) Rockey Situ Kathlene November, MD as PCP - Cardiology (Cardiology)  CHIEF COMPLAINT: Endometrial cancer, intractable pain declining performance status.  INTERVAL HISTORY: Patient is an 85 year old female who was admitted to the hospital with intractable pain secondary to metastatic left pelvic sidewall mass consistent with mildly progressive metastatic disease.  She was also admitted with sepsis of unclear source as well as worsening cardiomyopathy and possible non-STEMI.  She has no neurologic complaints.  She denies any fevers.  She has a poor appetite, but denies weight loss.  She has no chest pain or shortness of breath.  She denies any nausea, vomiting, constipation, or diarrhea.  She has no urinary complaints.  Patient feels generally terrible, but offers no further specific complaints.  REVIEW OF SYSTEMS:   Review of Systems  Constitutional: Positive for malaise/fatigue. Negative for fever and weight loss.  Respiratory: Negative.  Negative for cough, hemoptysis and shortness of breath.   Cardiovascular: Negative.  Negative for chest pain and leg swelling.  Gastrointestinal: Negative.  Negative for abdominal pain, blood in stool and melena.  Genitourinary: Negative.  Negative for dysuria.  Musculoskeletal: Positive for back pain.  Skin: Negative.  Negative for rash.  Neurological: Negative.  Negative for dizziness, focal weakness, weakness and headaches.  Psychiatric/Behavioral: Positive for depression. The patient has insomnia. The patient is not nervous/anxious.     As per HPI. Otherwise, a complete review of systems is negative.  PAST MEDICAL HISTORY: Past Medical History:  Diagnosis Date  . Arthritis   . CHF (congestive heart failure) ([place])   . Constipation   . DVT  (deep venous thrombosis) ([place]) 03/2017   left leg  . Endometrial cancer ([place]) 01/28/2016   Hysterectomy, chemo + rad tx's, also internal brachytherapy on 02/2018.   [name] Hyperlipidemia   . Hypertension   . Hypothyroidism     PAST SURGICAL HISTORY: Past Surgical History:  Procedure Laterality Date  . CYSTOSCOPY W/ RETROGRADES Left 01/01/2017   Procedure: CYSTOSCOPY WITH RETROGRADE PYELOGRAM;  Surgeon: Nickie Retort, MD;  Location: [facility];  Service: Urology;  Laterality: Left;  . CYSTOSCOPY W/ RETROGRADES Left 04/23/2017   Procedure: CYSTOSCOPY WITH RETROGRADE PYELOGRAM;  Surgeon: Abbie Sons, MD;  Location: [facility];  Service: Urology;  Laterality: Left;  . CYSTOSCOPY W/ URETERAL STENT PLACEMENT Left 04/23/2017   Procedure: CYSTOSCOPY WITH STENT REMOVAL;  Surgeon: Abbie Sons, MD;  Location: [facility];  Service: Urology;  Laterality: Left;  . CYSTOSCOPY WITH STENT PLACEMENT Left 01/01/2017   Procedure: CYSTOSCOPY WITH STENT PLACEMENT;  Surgeon: Nickie Retort, MD;  Location: [facility];  Service: Urology;  Laterality: Left;  . DILATION AND CURETTAGE OF UTERUS    . HYSTEROSCOPY W/D&C N/A 01/28/2016   Procedure: DILATATION AND CURETTAGE /HYSTEROSCOPY;  Surgeon: [name], MD;  Location: [facility];  Service: Gynecology;  Laterality: N/A;  . LAPAROSCOPIC BILATERAL SALPINGO OOPHERECTOMY Bilateral 02/26/2016   Procedure: LAPAROSCOPIC BILATERAL SALPINGO OOPHORECTOMY;  Surgeon: [name], MD;  Location: [facility];  Service: Gynecology;  Laterality: Bilateral;  . LAPAROSCOPIC HYSTERECTOMY N/A 02/26/2016   Procedure: HYSTERECTOMY TOTAL LAPAROSCOPIC;  Surgeon: [name], MD;  Location: [facility];  Service: Gynecology;  Laterality: N/A;  . SENTINEL NODE BIOPSY N/A 02/26/2016   Procedure: SENTINEL NODE BIOPSY;  Surgeon: [name], MD;  Location:  [facility];  Service: Gynecology;  Laterality: N/A;  . TONSILLECTOMY      FAMILY HISTORY: Family History  Problem Relation Age of  Onset  . Diabetes Father   . Heart disease Father   . Hypertension Father   . Stroke Father   . Diabetes Paternal Grandmother   . Heart disease Mother   . Hypertension Mother   . Stroke Mother   . Hypertension Sister   . Thyroid disease Sister   . Hypertension Brother     ADVANCED DIRECTIVES (Y/N):  @ADVDIR @  HEALTH MAINTENANCE: Social History   Tobacco Use  . Smoking status: Former Research scientist (life sciences)  . Smokeless tobacco: Never Used  Substance Use Topics  . Alcohol use: No  . Drug use: No     Colonoscopy:  PAP:  Bone density:  Lipid panel:  Allergies  Allergen Reactions  . Sulfa Antibiotics Rash  . Zinc Rash    Current Facility-Administered Medications  Medication Dose Route Frequency Provider Last Rate Last Dose  . acetaminophen (TYLENOL) tablet 650 mg  650 mg Oral Q6H PRN Pyreddy, Reatha Harps, MD       Or  . acetaminophen (TYLENOL) suppository 650 mg  650 mg Rectal Q6H PRN Pyreddy, Pavan, MD      . aspirin EC tablet 81 mg  81 mg Oral Daily [name], MD   81 mg at 05/31/18 1818  . atorvastatin (LIPITOR) tablet 10 mg  10 mg Oral q1800 [name], MD   10 mg at 05/31/18 1818  . calcium-vitamin D (OSCAL WITH D) 500-200 MG-UNIT per tablet 1 tablet  1 tablet Oral Daily Pyreddy, Pavan, MD      . ceFEPIme (MAXIPIME) 2 g in sodium chloride 0.9 % 100 mL IVPB  2 g Intravenous Q24H Pyreddy, Pavan, MD 200 mL/hr at 05/31/18 0941 2 g at 05/31/18 0941  . heparin ADULT infusion 100 units/mL (25000 units/281mL sodium chloride 0.45%)  800 Units/hr Intravenous Continuous Ojie, Jude, MD 8 mL/hr at 05/31/18 1917 800 Units/hr at 05/31/18 1917  . [START ON 06/01/2018] levothyroxine (SYNTHROID, LEVOTHROID) tablet 150 mcg  150 mcg Oral Q0600 Darel Hong D, NP      . metoprolol tartrate (LOPRESSOR) tablet 12.5 mg  12.5 mg Oral BID [name], MD      . morphine 2 MG/ML injection 2 mg  2 mg Intravenous Q2H PRN Flora Lipps, MD      . ondansetron (ZOFRAN) injection 4 mg  4 mg  Intravenous Q6H PRN Pyreddy, Reatha Harps, MD      . oxyCODONE (Oxy IR/ROXICODONE) immediate release tablet 10 mg  10 mg Oral Q3H PRN Saundra Shelling, MD   10 mg at 05/31/18 1955  . oxyCODONE (OXYCONTIN) 12 hr tablet 15 mg  15 mg Oral Q12H Borders, [name], NP   15 mg at 05/31/18 2142  . vancomycin (VANCOCIN) IVPB 750 mg/150 ml premix  750 mg Intravenous Q24H Pyreddy, Reatha Harps, MD 150 mL/hr at 05/31/18 1033 750 mg at 05/31/18 1033    OBJECTIVE: Vitals:   05/31/18 2152 05/31/18 2206  BP: (!) 100/51   Pulse: 82 75  Resp:    Temp:    SpO2:       Body mass index is 24.8 kg/m.    ECOG FS:4 - Bedbound  General: Ill-appearing, mild distress secondary to pain. Eyes: Pink conjunctiva, anicteric sclera. HEENT: Normocephalic, moist mucous membranes, clear oropharnyx. Lungs: Clear to auscultation bilaterally. Heart: Regular rate and rhythm. No rubs, murmurs, or gallops. Abdomen: Soft, nontender,  nondistended. No organomegaly noted, normoactive bowel sounds. Musculoskeletal: Bilateral lower extremity edema, left greater than right. Neuro: Alert, answering all questions appropriately. Cranial nerves grossly intact. Skin: No rashes or petechiae noted. Psych: Normal affect.  LAB RESULTS:  Lab Results  Component Value Date   NA 136 05/31/2018   K 5.2 (H) 05/31/2018   CL 109 05/31/2018   CO2 20 (L) 05/31/2018   GLUCOSE 99 05/31/2018   BUN 15 05/31/2018   CREATININE 0.68 05/31/2018   CALCIUM 7.8 (L) 05/31/2018   PROT 6.4 (L) 05/30/2018   ALBUMIN 2.2 (L) 05/30/2018   AST 47 (H) 05/30/2018   ALT 20 05/30/2018   ALKPHOS 73 05/30/2018   BILITOT 0.7 05/30/2018   GFRNONAA >60 05/31/2018   GFRAA >60 05/31/2018    Lab Results  Component Value Date   WBC 14.4 (H) 05/31/2018   NEUTROABS 9.6 (H) 05/26/2018   HGB 7.6 (L) 05/31/2018   HCT 24.9 (L) 05/31/2018   MCV 88.9 05/31/2018   PLT 396 05/31/2018     STUDIES: Ct Abdomen Pelvis W Contrast  Result Date: 05/30/2018 CLINICAL DATA:  Acute  generalized abdominal pain. EXAM: CT ABDOMEN AND PELVIS WITH CONTRAST TECHNIQUE: Multidetector CT imaging of the abdomen and pelvis was performed using the standard protocol following bolus administration of intravenous contrast. CONTRAST:  46mL OMNIPAQUE IOHEXOL 300 MG/ML  SOLN COMPARISON:  CT scan of May 20, 2018. FINDINGS: Lower chest: No acute abnormality. Hepatobiliary: No focal liver abnormality is seen. No gallstones, gallbladder wall thickening, or biliary dilatation. Pancreas: Unremarkable. No pancreatic ductal dilatation or surrounding inflammatory changes. Spleen: Stable lucency seen in the spleen most consistent with hemangioma or cyst. No other splenic abnormality seen. Adrenals/Urinary Tract: Adrenal glands appear normal. Stable right renal cysts are noted. Minimal left hydronephrosis is noted but there is no evidence of obstructing calculus. No definite evidence of ureteral compression is noted. Urinary bladder is unremarkable. Stomach/Bowel: The stomach appears normal. There is no evidence of bowel obstruction or inflammation. The appendix appears unremarkable. Vascular/Lymphatic: Atherosclerosis of abdominal aorta is noted without aneurysm formation. Reproductive: Status post hysterectomy and bilateral oophorectomy. 6.9 x 4.6 cm mass is noted in left side of pelvis and left iliac fossa consistent with ovarian malignancy or metastatic adenopathy. This is slightly enlarged compared to prior exam. Other: No abdominal wall hernia or abnormality. No abdominopelvic ascites. Musculoskeletal: No acute or significant osseous findings. IMPRESSION: 6.9 x 4.6 cm mass is noted in left pelvic sidewall and left iliac fossa consistent with ovarian malignancy or metastatic adenopathy. This is slightly enlarged compared to prior exam. Minimal left hydronephrosis is noted which is slightly improved compared to prior exam. No ureteral calculus or definite evidence of external compression is noted. Aortic  Atherosclerosis (ICD10-I70.0). Electronically Signed   By: Marijo Conception, M.D.   On: 05/30/2018 14:46   Ct Abdomen Pelvis W Contrast  Result Date: 05/20/2018 CLINICAL DATA:  Pt c/o severe Left groin pain radiating down her Left leg x 2 weeks. NKI Hx Endometrial CA diagnosed in 01/2017 and she had total hysterectomy, chemo + rad tx's. Also had internal brachytherapy on 02/2018. EXAM: CT ABDOMEN AND PELVIS WITH CONTRAST TECHNIQUE: Multidetector CT imaging of the abdomen and pelvis was performed using the standard protocol following bolus administration of intravenous contrast. CONTRAST:  54mL OMNIPAQUE IOHEXOL 300 MG/ML  SOLN COMPARISON:  Current abdomen radiographs. PET-CT, 02/04/2018. Abdomen and pelvis CT, 01/10/2018. FINDINGS: Lower chest: No acute abnormality. Hepatobiliary: Normal liver. Gallbladder is distended but otherwise unremarkable. No bile  duct dilation. Lucency noted along the right margin of the liver on the current abdomen radiographs is likely due to the right colon adjacent to the liver, accentuated by patient rotation. Pancreas: Unremarkable. No pancreatic ductal dilatation or surrounding inflammatory changes. Spleen: 9 mm low-density lesion in the medial spleen, stable from the prior exam, likely a cyst or hemangioma. Spleen normal in size. No other masses or lesions. Adrenals/Urinary Tract: No adrenal masses. No adrenal masses. Low-density right renal masses consistent with cysts, stable. No left renal masses. Mild left hydronephrosis with dilation of the proximal and mid left ureter. No ureteral stone. Ureter comes in contact with a left pelvic sidewall mass. No right hydronephrosis. Normal right ureter. Bladder is unremarkable. Stomach/Bowel: Stomach is unremarkable. Small bowel and colon are normal in caliber. There are colonic diverticula without diverticulitis. No small bowel or colon wall thickening or inflammation. Normal appendix visualized. Vascular/Lymphatic: Aortic  atherosclerosis extending to its branch vessels. No aneurysm. Stable inferior vena cava filter Left pelvic sidewall masses with associated calcifications, which may reflect locally invasive endometrial carcinoma or metastatic adenopathy. The precaval and right common iliac artery lymphadenopathy noted on the prior study has significantly improved with no residual enlarged lymph nodes. No other evidence of lymphadenopathy. Reproductive: Status post hysterectomy. Left pelvic sidewall mass, which is contiguous with the left iliacus muscle. Mass measures 7.3 x 3.9 x 5.8 cm, measuring 6.1 x 3.6 x 4.64 cm on the prior CT. There are associated calcifications mostly along its inferior margin. No underlying bone resorption. Other: No abdominal wall hernia.  No ascites. Musculoskeletal: No fracture or acute finding. No osteoblastic or osteolytic lesions. IMPRESSION: 1. The lucency along the right margin of the liver noted on the current abdomen radiographs was artifactual, most likely due to the right colon along the anterolateral liver margin, accentuated by patient rotation. 2. Mild left hydronephrosis and hydroureter, new since the prior CT. This appears due to an extrinsic effect by left pelvic sidewall mass, which contacts the distal left ureter. No ureteral stone. 3. Left pelvic sidewall mass, consistent with locally invasive endometrial carcinoma or metastatic adenopathy or a combination, has increased in size from the prior CT. However, the precaval and right common iliac chain adenopathy noted on the prior study has significantly improved. 4. No other evidence of metastatic disease. 5. Other chronic findings, including aortic atherosclerosis, are stable from prior CT. Electronically Signed   By: Lajean Manes M.D.   On: 05/20/2018 16:18   Dg Bone Density  Result Date: 05/12/2018 EXAM: DUAL X-RAY ABSORPTIOMETRY (DXA) FOR BONE MINERAL DENSITY IMPRESSION: Technologist: SCE PATIENT BIOGRAPHICAL: Name: Deatra, Mcmahen Patient ID: [id number] Birth Date: [birth year] Height: 60.0 in. Gender: Female Exam Date: 05/12/2018 Weight: 132.5 lbs. Indications: Advanced Age, Caucasian, Height Loss, History of Chemo, History of Endometrial Cancer, History of Radiation, Hypothyroid, Hysterectomy, Oophorectomy Bilateral, Postmenopausal Fractures: Treatments: Levothyroxine ASSESSMENT: The BMD measured at Forearm Radius 33% is 0.534 g/cm2 with a T-score of -3.9. This patient is considered osteoporotic according to [facility]) criteria. The quality of the scan was limited. Lumbar spine was not utilized due to surgrical hardware visible. Site Region Measured Measured WHO Young Adult BMD Date       Age      Classification T-score DualFemur Total Right 05/12/2018 85.8 Osteoporosis -3.0 0.628 g/cm2 Left Forearm Radius 33% 05/12/2018 85.8 Osteoporosis -3.9 0.534 g/cm2 World Health Organization [facility]) criteria for post-menopausal, Caucasian Women: Normal:       T-score at or above -1 SD  Osteopenia:   T-score between -1 and -2.5 SD Osteoporosis: T-score at or below -2.5 SD RECOMMENDATIONS: 1. All patients should optimize calcium and vitamin D intake. 2. Consider FDA-approved medical therapies in postmenopausal women and men aged 84 years and older, based on the following: a. A hip or vertebral(clinical or morphometric) fracture b. T-score < -2.5 at the femoral neck or spine after appropriate evaluation to exclude secondary causes c. Low bone mass (T-score between -1.0 and -2.5 at the femoral neck or spine) and a 10-year probability of a hip fracture > 3% or a 10-year probability of a major osteoporosis-related fracture > 20% based on the [place]-adapted WHO algorithm d. Clinician judgment and/or patient preferences may indicate treatment for people with 10-year fracture probabilities above or below these levels FOLLOW-UP: People with diagnosed cases of osteoporosis or at high risk for fracture should have regular bone mineral density  tests. For patients eligible for Medicare, routine testing is allowed once every 2 years. The testing frequency can be increased to one year for patients who have rapidly progressing disease, those who are receiving or discontinuing medical therapy to restore bone mass, or have additional risk factors. I have reviewed this report, and agree with the above findings. [facility] Radiology Electronically Signed   By: Lowella Grip III M.D.   On: 05/12/2018 09:27   Dg Chest Port 1 View  Result Date: 05/30/2018 CLINICAL DATA:  Sepsis.  Ovarian cancer. EXAM: PORTABLE CHEST 1 VIEW COMPARISON:  11/04/2016 FINDINGS: The heart size and mediastinal contours are within normal limits. Both lungs are clear. The visualized skeletal structures are unremarkable. IMPRESSION: No active disease. Electronically Signed   By: Lorriane Shire M.D.   On: 05/30/2018 13:32   Dg Abd 2 Views  Result Date: 05/20/2018 CLINICAL DATA:  LLQ pain getting worse since 1 week ago/ the pain in setting of recurrent endometerial cancerHysterectomy 2 years ago Constipation X days EXAM: ABDOMEN - 2 VIEW COMPARISON:  CT, 01/10/2018 FINDINGS: Normal bowel gas pattern.  No evidence of obstruction. On the initial supine image, there is relative lucency along the right lateral margin of the liver shadow with an apparent irregular contour of the right lateral liver margin. This is of unclear etiology. Stable well-positioned inferior vena cava filter. No evidence of renal or ureteral stones. Clear lung bases.  No acute skeletal abnormality. IMPRESSION: 1. No acute findings.  No evidence of bowel obstruction. 2. Abnormal appearing lucency along the right lateral margin of the liver of unclear etiology. This could potentially be artifact. This could be further assessed with CT. Electronically Signed   By: Lajean Manes M.D.   On: 05/20/2018 12:48    ASSESSMENT: Endometrial cancer, intractable pain declining performance status.  PLAN:   1.  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 Adjuvant treatment was recommended at that time, but patient refused on multiple occations and missed multiple follow-up appointments in the interim.  After agreeing to adjuvant chemotherapy, patient finally completed 6 cycles of carboplatinum and Taxol on April 21, 2017.  PET scan results from May 26, 2017 reviewed independently with interval decrease in size of left adnexal lesion.  There was residual hypermetabolic activity possibly indicating residual disease therefore patient was given a referral to radiation oncology for local control.  Patient completed adjuvant XRT.  CT scan results from January 10, 2018 reviewed independently with a stable pelvic sidewall mass, but significant enlargement of the right common iliac node.  She completed a second round of XRT to this area on March 08, 2018.  Patient was then placed on maintenance letrozole, but discontinued therapy.  CT scan results from May 20, 2018 reviewed independently and report as above with mildly progressive disease.  Given patient's declining performance status and multiple medical problems including recent cardiac issues, she is not a candidate for additional chemotherapy at this time.  If her performance status improves, can reconsider this in the future. 2.  Pain: Appreciate palliative care input.  Agree with OxyContin and prn oxycodone.  Also agree with outpatient referral to interventional pain clinic. 3.  History of DVT/PE: Continue Eliquis as prescribed. 4.  Cardiomyopathy/non-STEMI: Patient has TEE scheduled for tomorrow to assess for valvular lesion.  Appreciate cardiology input. 5.  Possible sepsis: Blood cultures are negative to date.  TEE as above.  Continue current antibiotics.  Appreciate consult, will follow.  [name], MD   05/31/2018 10:32 PM

## 2018-05-31 NOTE — Progress Notes (Signed)
ANTICOAGULATION CONSULT NOTE - Initial Consult  Pharmacy Consult for heparin drip Indication: chest pain/ACS  Allergies  Allergen Reactions  . Sulfa Antibiotics Rash  . Zinc Rash    Patient Measurements: Height: 5\' 1"  (154.9 cm) Weight: 134 lb 11.2 oz (61.1 kg) IBW/kg (Calculated) : 47.8 Heparin Dosing Weight: 53 kg  Vital Signs: Temp: 98.6 F (37 C) (03/10 0000) Temp Source: Oral (03/10 0000) BP: 121/63 (03/10 0400) Pulse Rate: 70 (03/10 0400)  Labs: Recent Labs    05/30/18 1134 05/30/18 1848 05/31/18 0106 05/31/18 0353  HGB 8.7*  --   --  7.6*  HCT 28.1*  --   --  24.9*  PLT 397  --   --  396  APTT  --   --   --  58*  LABPROT  --   --   --  32.2*  INR  --   --   --  3.2*  CREATININE 0.81  --  0.75 0.68  TROPONINI  --  7.86* 6.36*  --     Estimated Creatinine Clearance: 43.1 mL/min (by C-G formula based on SCr of 0.68 mg/dL).   Medical History: Past Medical History:  Diagnosis Date  . Arthritis   . CHF (congestive heart failure) ([place])   . Constipation   . DVT (deep venous thrombosis) ([place]) 03/2017   left leg  . Endometrial cancer ([place]) 01/28/2016   Hysterectomy, chemo + rad tx's, also internal brachytherapy on 02/2018.   [name] Hyperlipidemia   . Hypertension   . Hypothyroidism     Medications:  Scheduled:  . calcium-vitamin D  1 tablet Oral Daily  . levothyroxine  75 mcg Oral Q0600    Assessment: Patient admitted for fever, weakness w/ h/o ovarian malignancy and h/o DVT anticoagulated w/ eliquis. On arrival trops were 7.86 >> 6.36; EKG showing ST elevation in leads V II, III, and IV. Patient received the last dose of eliquis 10 mg on 03/09 @ 2200. However, NP concerned that cardiology may want to cath so pharmacy has been consulted to start a heparin drip.  Goal of Therapy:  APTT 66 - 102 seconds  Heparin level 0.3-0.7 units/ml Monitor platelets by anticoagulation protocol: Yes   Plan:  Eliquis order has been d/c'd Will omit bolus since patient  received a 10 mg eliquis dose. Will start rate at 700 units/hr -- being started 7 hours post eliquis dose. Of note; patient's coag labs are abnormal, INR 3.2, hgb 7.6, aPTT 58s.  Patient has an ovarian malignancy that may have metastasized, but according to CT abdomen liver appears normal. Will monitor daily CBC's sparingly as hgb is now, and will adjust per aPTT for now; once both aPTT and anti-Xa correlate will dose off of anti-Xa.  Tobie Lords, PharmD, BCPS Clinical Pharmacist 05/31/2018

## 2018-05-31 NOTE — Progress Notes (Signed)
[place] for heparin drip Indication: chest pain/ACS  Allergies  Allergen Reactions  . Sulfa Antibiotics Rash  . Zinc Rash    Patient Measurements: Height: 5\' 2"  (157.5 cm) Weight: 135 lb 9.6 oz (61.5 kg) IBW/kg (Calculated) : 50.1 Heparin Dosing Weight: 53 kg  Vital Signs: Temp: 98.6 F (37 C) (03/10 1737) Temp Source: Oral (03/10 1737) BP: 115/63 (03/10 1737) Pulse Rate: 72 (03/10 1737)  Labs: Recent Labs    05/30/18 1134 05/30/18 1848 05/31/18 0106 05/31/18 0353 05/31/18 0633 05/31/18 1151 05/31/18 1824  HGB 8.7*  --   --  7.6*  --   --   --   HCT 28.1*  --   --  24.9*  --   --   --   PLT 397  --   --  396  --   --   --   APTT  --   --   --  58*  --  72* 53*  LABPROT  --   --   --  32.2*  --   --   --   INR  --   --   --  3.2*  --   --   --   HEPARINUNFRC  --   --   --  >3.60*  --   --   --   CREATININE 0.81  --  0.75 0.68  --   --   --   TROPONINI  --  7.86* 6.36*  --  5.76*  --   --     Estimated Creatinine Clearance: 44.4 mL/min (by C-G formula based on SCr of 0.68 mg/dL).   Medical History: Past Medical History:  Diagnosis Date  . Arthritis   . CHF (congestive heart failure) ([place])   . Constipation   . DVT (deep venous thrombosis) ([place]) 03/2017   left leg  . Endometrial cancer ([place]) 01/28/2016   Hysterectomy, chemo + rad tx's, also internal brachytherapy on 02/2018.   [name] Hyperlipidemia   . Hypertension   . Hypothyroidism     Medications:  Scheduled:  . aspirin EC  81 mg Oral Daily  . atorvastatin  10 mg Oral q1800  . calcium-vitamin D  1 tablet Oral Daily  . [START ON 06/01/2018] levothyroxine  150 mcg Oral Q0600  . metoprolol tartrate  12.5 mg Oral BID  . oxyCODONE  15 mg Oral Q12H    Assessment: Patient admitted for fever, weakness w/ h/o ovarian malignancy and h/o DVT anticoagulated w/ eliquis. On arrival trops were 7.86 >> 6.36; EKG showing ST elevation in leads V II, III, and IV. Patient received  the last dose of eliquis 10 mg on 03/09 @ 2200. Heparin is currently infusing at 700 units/hr.   3/10 @1151  aPTT 72 3/10 @ 1824 aPTT 53 subtherapeutic will increase rate   Goal of Therapy:  APTT 66 - 102 seconds  Heparin level 0.3-0.7 units/ml once heparin level and aPTT correlate.  Monitor platelets by anticoagulation protocol: Yes   Plan:  Will give a 900 unit bolus and increase the rate to 800 and order aPTT level in 8 hours.     Will obtain heparin level and CBC with daily labs. Will continue to manage with aPTTs until heparin levels and aPTTs correlate.   Pharmacy will continue to monitor and adjust per consult.   MLS 05/31/2018

## 2018-05-31 NOTE — Progress Notes (Signed)
[place] for heparin drip Indication: chest pain/ACS  Allergies  Allergen Reactions  . Sulfa Antibiotics Rash  . Zinc Rash    Patient Measurements: Height: 5\' 1"  (154.9 cm) Weight: 134 lb 11.2 oz (61.1 kg) IBW/kg (Calculated) : 47.8 Heparin Dosing Weight: 53 kg  Vital Signs: Temp: 99.7 F (37.6 C) (03/10 0400) Temp Source: Oral (03/10 0400) BP: 126/67 (03/10 0900) Pulse Rate: 77 (03/10 0900)  Labs: Recent Labs    05/30/18 1134 05/30/18 1848 05/31/18 0106 05/31/18 0353 05/31/18 0633 05/31/18 1151  HGB 8.7*  --   --  7.6*  --   --   HCT 28.1*  --   --  24.9*  --   --   PLT 397  --   --  396  --   --   APTT  --   --   --  58*  --  72*  LABPROT  --   --   --  32.2*  --   --   INR  --   --   --  3.2*  --   --   HEPARINUNFRC  --   --   --  >3.60*  --   --   CREATININE 0.81  --  0.75 0.68  --   --   TROPONINI  --  7.86* 6.36*  --  5.76*  --     Estimated Creatinine Clearance: 43.1 mL/min (by C-G formula based on SCr of 0.68 mg/dL).   Medical History: Past Medical History:  Diagnosis Date  . Arthritis   . CHF (congestive heart failure) ([place])   . Constipation   . DVT (deep venous thrombosis) ([place]) 03/2017   left leg  . Endometrial cancer ([place]) 01/28/2016   Hysterectomy, chemo + rad tx's, also internal brachytherapy on 02/2018.   [name] Hyperlipidemia   . Hypertension   . Hypothyroidism     Medications:  Scheduled:  . calcium-vitamin D  1 tablet Oral Daily  . [START ON 06/01/2018] levothyroxine  150 mcg Oral Q0600    Assessment: Patient admitted for fever, weakness w/ h/o ovarian malignancy and h/o DVT anticoagulated w/ eliquis. On arrival trops were 7.86 >> 6.36; EKG showing ST elevation in leads V II, III, and IV. Patient received the last dose of eliquis 10 mg on 03/09 @ 2200. Heparin is currently infusing at 700 units/hr.   Goal of Therapy:  APTT 66 - 102 seconds  Heparin level 0.3-0.7 units/ml Monitor platelets by  anticoagulation protocol: Yes   Plan:  Will continue heparin at 700 units/hr. Will obtain confirmatory aPTT at 1800.   Will obtain heparin level and CBC with daily labs. Will continue to manage with aPTTs until heparin levels and aPTTs correlate.   Pharmacy will continue to monitor and adjust per consult.   MLS 05/31/2018

## 2018-05-31 NOTE — Progress Notes (Signed)
[facility] [place] NAME: Vickie Barton    MR#:  [id number]  DATE OF BIRTH:  Jan 01, 1931  SUBJECTIVE:  CHIEF COMPLAINT:   Chief Complaint  Patient presents with  . Fever  . Weakness  . Nausea  . Emesis    REVIEW OF SYSTEMS:  ROS  No new complaint this morning.  No fevers. No Chest pains. Nausea and vomiting and diarrhea with abdominal pains significantly improved.  Daughter at bedside.  C. difficile was negative.  DRUG ALLERGIES:   Allergies  Allergen Reactions  . Sulfa Antibiotics Rash  . Zinc Rash   VITALS:  Blood pressure 129/69, pulse 61, temperature 98.3 F (36.8 C), resp. rate (!) 26, height 5\' 1"  (1.549 m), weight 61.1 kg, SpO2 98 %. PHYSICAL EXAMINATION:  Physical Exam  GENERAL:  85 y.o.-year-old patient lying in the bed with no acute distress.  EYES: Pupils equal, round, reactive to light and accommodation. No scleral icterus. Extraocular muscles intact.  HEENT: Head atraumatic, normocephalic. Oropharynx and nasopharynx clear.  NECK:  Supple, no jugular venous distention. No thyroid enlargement, no tenderness.  LUNGS: Normal breath sounds bilaterally, no wheezing, rales,rhonchi or crepitation. No use of accessory muscles of respiration.  CARDIOVASCULAR: S1, S2 normal. No murmurs, rubs, or gallops.  ABDOMEN: Soft, No tenderness , nondistended. Bowel sounds present. No organomegaly or mass.  EXTREMITIES: No pedal edema, cyanosis, or clubbing.  NEUROLOGIC: Cranial nerves II through XII are intact. Muscle strength 5/5 in all extremities. Sensation intact. Gait not checked.  PSYCHIATRIC: The patient is alert and oriented x 3.  SKIN: No obvious rash, lesion, or ulcer.  LABORATORY PANEL:  Female CBC Recent Labs  Lab 05/31/18 0353  WBC 14.4*  HGB 7.6*  HCT 24.9*  PLT 396   ------------------------------------------------------------------------------------------------------------------ Chemistries  Recent Labs    Lab 05/30/18 1134 05/31/18 0106 05/31/18 0353  NA 136 137 136  K 2.8* 5.2* 5.2*  CL 99 108 109  CO2 24 20* 20*  GLUCOSE 131* 102* 99  BUN 14 15 15   CREATININE 0.81 0.75 0.68  CALCIUM 7.9* 7.6* 7.8*  MG  --  1.8  --   AST 47*  --   --   ALT 20  --   --   ALKPHOS 73  --   --   BILITOT 0.7  --   --    RADIOLOGY:  No results found. ASSESSMENT AND PLAN:   85 year old female patient with a known history of DVT in the left leg, endometrial cancer, status post chemotherapy and radiation therapy, hypertension, hypothyroidism, chronic congestive heart failure presented to the emergency room for generalized weakness, nausea and vomiting and diarrhea.  Diagnosed with sepsis.  Etiology not yet determined  1. Sepsis Unknown etiology. Being managed by critical care physician in the ICU.  Patient on broad-spectrum IV antibiotics with vancomycin and cefepime  Follow-up on cultures.  Noted elevated procalcitonin level concerning for bacterial infection.  Follow-up on blood cultures  2.  Elevated troponin without chest pain Patient with elevated troponin in the setting of ongoing infectious process.  Patient currently on heparin drip. Being followed by cardiologist.  Patient scheduled for 2D echocardiogram for further evaluation especially due to concern for possible underlying endocarditis.  Further recommendation by cardiology will depend on findings on TTE.  3.  Chronic systolic CHF Patient with heart failure with reduced ejection fraction.  Stable.  4.  Mild hyperkalemia Follow-up on repeat potassium level in a.m.  5.Acute diarrhea  self-limiting C. difficile negative.  6.Endometrial and ovarian malignancy Oncology follow-up  7. History of DVT left leg Patient was on Eliquis prior to admission which is currently on hold due to patient being on heparin drip at this time for elevated troponin  -DVT prophylaxis; patient currently on heparin drip  All the records are reviewed and  case discussed with Care Management/Social Worker. Management plans discussed with the patient, family and they are in agreement.  CODE STATUS: Full Code  TOTAL TIME TAKING CARE OF THIS PATIENT: 37 minutes.   More than 50% of the time was spent in counseling/coordination of care: YES  POSSIBLE D/C IN 2 DAYS, DEPENDING ON CLINICAL CONDITION.   Aviv Rota M.D on 05/31/2018 at 3:06 PM  Between 7am to 6pm - Pager - [phone]  After 6pm go to [email or web address] - Proofreader  [facility]  Office  [phone]  CC: Primary care physician; [name], MD  Note: This dictation was prepared with Dragon dictation along with smaller phrase technology. Any transcriptional errors that result from this process are unintentional.

## 2018-05-31 NOTE — Consult Note (Signed)
[place]  [phone] [phone]   Name: Vickie Barton Date: 05/31/2018 MRN: [id number]  DOB: December 20, 1930  Patient Care Team: [name], MD as PCP - General (Family Medicine) Rockey Situ, Kathlene November, MD as PCP - Cardiology (Cardiology)    REASON FOR CONSULTATION: Palliative Care consult requested for this 85 y.o. 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  PMH also notable for history of PE/DVT on anticoagulation with Eliquis and status post IVC filter, left hydronephrosis and hydroureter status post ureteral stent placement.  Patient has had chronic pain from progressive metastatic disease.  CT on 05/20/2018 reveals large left pelvic sidewall mass, which had increased in size from previous study and likely is exacerbating pain. Further progression of the mass was noted on repeat CT on 05/30/18.  Patient was admitted to the hospital on 05/30/2018 with sepsis from unclear source.  TTE was concerning for possible endocarditis.  Patient also noted to have cardiomyopathy with EF 35% and anterior wall dysfunction with acute elevation of troponin concerning for non-STEMI.  Palliative care was consulted to help address goals.  SOCIAL HISTORY:    Patient is widowed.  She lives with her daughter.  Patient had a son who died in 04/06/18 from cancer.  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  She later worked at an [employer].  ADVANCE DIRECTIVES:  Does not have  CODE STATUS: DNR  PAST MEDICAL HISTORY: Past Medical History:  Diagnosis Date  . Arthritis   . CHF (congestive heart failure) ([place])   . Constipation   . DVT (deep venous thrombosis) ([place]) 2017/04/06   left leg  . Endometrial cancer ([place])  01/28/2016   Hysterectomy, chemo + rad tx's, also internal brachytherapy on 02/2018.   [name] Hyperlipidemia   . Hypertension   . Hypothyroidism     PAST SURGICAL HISTORY:  Past Surgical History:  Procedure Laterality Date  . CYSTOSCOPY W/ RETROGRADES Left 01/01/2017   Procedure: CYSTOSCOPY WITH RETROGRADE PYELOGRAM;  Surgeon: Nickie Retort, MD;  Location: [facility] ORS;  Service: Urology;  Laterality: Left;  . CYSTOSCOPY W/ RETROGRADES Left 04/23/2017   Procedure: CYSTOSCOPY WITH RETROGRADE PYELOGRAM;  Surgeon: Abbie Sons, MD;  Location: [facility];  Service: Urology;  Laterality: Left;  . CYSTOSCOPY W/ URETERAL STENT PLACEMENT Left 04/23/2017   Procedure: CYSTOSCOPY WITH STENT REMOVAL;  Surgeon: Abbie Sons, MD;  Location: [facility];  Service: Urology;  Laterality: Left;  . CYSTOSCOPY WITH STENT PLACEMENT Left 01/01/2017   Procedure: CYSTOSCOPY WITH STENT PLACEMENT;  Surgeon: Nickie Retort, MD;  Location: [facility];  Service: Urology;  Laterality: Left;  . DILATION AND CURETTAGE OF UTERUS    . HYSTEROSCOPY W/D&C N/A 01/28/2016   Procedure: DILATATION AND CURETTAGE /HYSTEROSCOPY;  Surgeon: [name], MD;  Location: [facility];  Service: Gynecology;  Laterality: N/A;  . LAPAROSCOPIC BILATERAL SALPINGO OOPHERECTOMY Bilateral 02/26/2016   Procedure: LAPAROSCOPIC BILATERAL SALPINGO OOPHORECTOMY;  Surgeon: [name], MD;  Location: [facility];  Service: Gynecology;  Laterality: Bilateral;  . LAPAROSCOPIC HYSTERECTOMY N/A 02/26/2016   Procedure: HYSTERECTOMY TOTAL LAPAROSCOPIC;  Surgeon: [name], MD;  Location: [facility];  Service: Gynecology;  Laterality: N/A;  . SENTINEL NODE BIOPSY N/A 02/26/2016   Procedure: SENTINEL NODE BIOPSY;  Surgeon: [name], MD;  Location: [facility]  ORS;  Service: Gynecology;  Laterality: N/A;  . TONSILLECTOMY      HEMATOLOGY/ONCOLOGY HISTORY:   No history exists.    ALLERGIES:  is allergic to sulfa antibiotics and zinc.  MEDICATIONS:    Current Facility-Administered Medications  Medication Dose Route Frequency Provider Last Rate Last Dose  . acetaminophen (TYLENOL) tablet 650 mg  650 mg Oral Q6H PRN Saundra Shelling, MD       Or  . acetaminophen (TYLENOL) suppository 650 mg  650 mg Rectal Q6H PRN Pyreddy, Reatha Harps, MD      . calcium-vitamin D (OSCAL WITH D) 500-200 MG-UNIT per tablet 1 tablet  1 tablet Oral Daily Pyreddy, Pavan, MD      . ceFEPIme (MAXIPIME) 2 g in sodium chloride 0.9 % 100 mL IVPB  2 g Intravenous Q24H Pyreddy, Reatha Harps, MD 200 mL/hr at 05/31/18 0941 2 g at 05/31/18 0941  . fentaNYL (SUBLIMAZE) injection 50 mcg  50 mcg Intravenous Q1H PRN Merlyn Lot, MD   50 mcg at 05/31/18 1255  . heparin ADULT infusion 100 units/mL (25000 units/229m sodium chloride 0.45%)  700 Units/hr Intravenous Continuous KDarel HongD, NP 7 mL/hr at 05/31/18 0700 700 Units/hr at 05/31/18 0700  . [START ON 06/01/2018] levothyroxine (SYNTHROID, LEVOTHROID) tablet 150 mcg  150 mcg Oral Q0600 KDarel HongD, NP      . ondansetron ([facility] injection 4 mg  4 mg Intravenous Q6H PRN Pyreddy, PReatha Harps MD      . oxyCODONE (Oxy IR/ROXICODONE) immediate release tablet 10 mg  10 mg Oral Q3H PRN PSaundra Shelling MD   10 mg at 05/31/18 1613  . oxyCODONE (OXYCONTIN) 12 hr tablet 15 mg  15 mg Oral Q12H , JKirt Boys NP      . vancomycin (VANCOCIN) IVPB 750 mg/150 ml premix  750 mg Intravenous Q24H Pyreddy, PReatha Harps MD 150 mL/hr at 05/31/18 1033 750 mg at 05/31/18 1033    VITAL SIGNS: BP 129/69   Pulse 61   Temp 98.3 F (36.8 C)   Resp (!) 26   Ht _0  (1.549 m)   Wt 134 lb 11.2 oz (61.1 kg)   SpO2 98%   BMI 25.45 kg/m  Filed Weights   05/30/18 1131 05/30/18 1831  Weight: 127 lb (57.6 kg) 134 lb 11.2 oz (61.1 kg)    Estimated body mass index is 25.45 kg/m as calculated from the following:   Height as of this encounter: _1  (1.549 m).   Weight as of this encounter: 134 lb 11.2 oz (61.1 kg).  LABS: CBC:    Component Value  Date/Time   WBC 14.4 (H) 05/31/2018 0353   HGB 7.6 (L) 05/31/2018 0353   HCT 24.9 (L) 05/31/2018 0353   PLT 396 05/31/2018 0353   MCV 88.9 05/31/2018 0353   NEUTROABS 9.6 (H) 05/26/2018 1544   LYMPHSABS 0.8 05/26/2018 1544   MONOABS 0.7 05/26/2018 1544   EOSABS 0.0 05/26/2018 1544   BASOSABS 0.0 05/26/2018 1544   Comprehensive Metabolic Panel:    Component Value Date/Time   NA 136 05/31/2018 0353   K 5.2 (H) 05/31/2018 0353   CL 109 05/31/2018 0353   CO2 20 (L) 05/31/2018 0353   BUN 15 05/31/2018 0353   CREATININE 0.68 05/31/2018 0353   GLUCOSE 99 05/31/2018 0353   CALCIUM 7.8 (L) 05/31/2018 0353   AST 47 (H) 05/30/2018 1134   ALT 20 05/30/2018 1134   ALKPHOS 73 05/30/2018 1134   BILITOT 0.7 05/30/2018 1134   PROT 6.4 (L) 05/30/2018  1134   ALBUMIN 2.2 (L) 05/30/2018 1134    RADIOGRAPHIC STUDIES: Ct Abdomen Pelvis W Contrast  Result Date: 05/30/2018 CLINICAL DATA:  Acute generalized abdominal pain. EXAM: CT ABDOMEN AND PELVIS WITH CONTRAST TECHNIQUE: Multidetector CT imaging of the abdomen and pelvis was performed using the standard protocol following bolus administration of intravenous contrast. CONTRAST:  16m OMNIPAQUE IOHEXOL 300 MG/ML  SOLN COMPARISON:  CT scan of May 20, 2018. FINDINGS: Lower chest: No acute abnormality. Hepatobiliary: No focal liver abnormality is seen. No gallstones, gallbladder wall thickening, or biliary dilatation. Pancreas: Unremarkable. No pancreatic ductal dilatation or surrounding inflammatory changes. Spleen: Stable lucency seen in the spleen most consistent with hemangioma or cyst. No other splenic abnormality seen. Adrenals/Urinary Tract: Adrenal glands appear normal. Stable right renal cysts are noted. Minimal left hydronephrosis is noted but there is no evidence of obstructing calculus. No definite evidence of ureteral compression is noted. Urinary bladder is unremarkable. Stomach/Bowel: The stomach appears normal. There is no evidence of  bowel obstruction or inflammation. The appendix appears unremarkable. Vascular/Lymphatic: Atherosclerosis of abdominal aorta is noted without aneurysm formation. Reproductive: Status post hysterectomy and bilateral oophorectomy. 6.9 x 4.6 cm mass is noted in left side of pelvis and left iliac fossa consistent with ovarian malignancy or metastatic adenopathy. This is slightly enlarged compared to prior exam. Other: No abdominal wall hernia or abnormality. No abdominopelvic ascites. Musculoskeletal: No acute or significant osseous findings. IMPRESSION: 6.9 x 4.6 cm mass is noted in left pelvic sidewall and left iliac fossa consistent with ovarian malignancy or metastatic adenopathy. This is slightly enlarged compared to prior exam. Minimal left hydronephrosis is noted which is slightly improved compared to prior exam. No ureteral calculus or definite evidence of external compression is noted. Aortic Atherosclerosis (ICD10-I70.0). Electronically Signed   By: JMarijo Conception M.D.   On: 05/30/2018 14:46   Ct Abdomen Pelvis W Contrast  Result Date: 05/20/2018 CLINICAL DATA:  Pt c/o severe Left groin pain radiating down her Left leg x 2 weeks. NKI Hx Endometrial CA diagnosed in 01/2017 and she had total hysterectomy, chemo + rad tx's. Also had internal brachytherapy on 02/2018. EXAM: CT ABDOMEN AND PELVIS WITH CONTRAST TECHNIQUE: Multidetector CT imaging of the abdomen and pelvis was performed using the standard protocol following bolus administration of intravenous contrast. CONTRAST:  734mOMNIPAQUE IOHEXOL 300 MG/ML  SOLN COMPARISON:  Current abdomen radiographs. PET-CT, 02/04/2018. Abdomen and pelvis CT, 01/10/2018. FINDINGS: Lower chest: No acute abnormality. Hepatobiliary: Normal liver. Gallbladder is distended but otherwise unremarkable. No bile duct dilation. Lucency noted along the right margin of the liver on the current abdomen radiographs is likely due to the right colon adjacent to the liver, accentuated  by patient rotation. Pancreas: Unremarkable. No pancreatic ductal dilatation or surrounding inflammatory changes. Spleen: 9 mm low-density lesion in the medial spleen, stable from the prior exam, likely a cyst or hemangioma. Spleen normal in size. No other masses or lesions. Adrenals/Urinary Tract: No adrenal masses. No adrenal masses. Low-density right renal masses consistent with cysts, stable. No left renal masses. Mild left hydronephrosis with dilation of the proximal and mid left ureter. No ureteral stone. Ureter comes in contact with a left pelvic sidewall mass. No right hydronephrosis. Normal right ureter. Bladder is unremarkable. Stomach/Bowel: Stomach is unremarkable. Small bowel and colon are normal in caliber. There are colonic diverticula without diverticulitis. No small bowel or colon wall thickening or inflammation. Normal appendix visualized. Vascular/Lymphatic: Aortic atherosclerosis extending to its branch vessels. No aneurysm. Stable inferior vena  cava filter Left pelvic sidewall masses with associated calcifications, which may reflect locally invasive endometrial carcinoma or metastatic adenopathy. The precaval and right common iliac artery lymphadenopathy noted on the prior study has significantly improved with no residual enlarged lymph nodes. No other evidence of lymphadenopathy. Reproductive: Status post hysterectomy. Left pelvic sidewall mass, which is contiguous with the left iliacus muscle. Mass measures 7.3 x 3.9 x 5.8 cm, measuring 6.1 x 3.6 x 4.64 cm on the prior CT. There are associated calcifications mostly along its inferior margin. No underlying bone resorption. Other: No abdominal wall hernia.  No ascites. Musculoskeletal: No fracture or acute finding. No osteoblastic or osteolytic lesions. IMPRESSION: 1. The lucency along the right margin of the liver noted on the current abdomen radiographs was artifactual, most likely due to the right colon along the anterolateral liver margin,  accentuated by patient rotation. 2. Mild left hydronephrosis and hydroureter, new since the prior CT. This appears due to an extrinsic effect by left pelvic sidewall mass, which contacts the distal left ureter. No ureteral stone. 3. Left pelvic sidewall mass, consistent with locally invasive endometrial carcinoma or metastatic adenopathy or a combination, has increased in size from the prior CT. However, the precaval and right common iliac chain adenopathy noted on the prior study has significantly improved. 4. No other evidence of metastatic disease. 5. Other chronic findings, including aortic atherosclerosis, are stable from prior CT. Electronically Signed   By: Lajean Manes M.D.   On: 05/20/2018 16:18   Dg Bone Density  Result Date: 05/12/2018 EXAM: DUAL X-RAY ABSORPTIOMETRY (DXA) FOR BONE MINERAL DENSITY IMPRESSION: Technologist: SCE PATIENT BIOGRAPHICAL: Name: Emogene, [place] Patient ID: [id number] Birth Date: Dec 14, 1930 Height: 60.0 in. Gender: Female Exam Date: 05/12/2018 Weight: 132.5 lbs. Indications: Advanced Age, Caucasian, Height Loss, History of Chemo, History of Endometrial Cancer, History of Radiation, Hypothyroid, Hysterectomy, Oophorectomy Bilateral, Postmenopausal Fractures: Treatments: Levothyroxine ASSESSMENT: The BMD measured at Forearm Radius 33% is 0.534 g/cm2 with a T-score of -3.9. This patient is considered osteoporotic according to [facility]) criteria. The quality of the scan was limited. Lumbar spine was not utilized due to surgrical hardware visible. Site Region Measured Measured WHO Young Adult BMD Date       Age      Classification T-score DualFemur Total Right 05/12/2018 85.8 Osteoporosis -3.0 0.628 g/cm2 Left Forearm Radius 33% 05/12/2018 85.8 Osteoporosis -3.9 0.534 g/cm2 World Health Organization [facility]) criteria for post-menopausal, Caucasian Women: Normal:       T-score at or above -1 SD Osteopenia:   T-score between -1 and -2.5 SD Osteoporosis: T-score  at or below -2.5 SD RECOMMENDATIONS: 1. All patients should optimize calcium and vitamin D intake. 2. Consider FDA-approved medical therapies in postmenopausal women and men aged 60 years and older, based on the following: a. A hip or vertebral(clinical or morphometric) fracture b. T-score < -2.5 at the femoral neck or spine after appropriate evaluation to exclude secondary causes c. Low bone mass (T-score between -1.0 and -2.5 at the femoral neck or spine) and a 10-year probability of a hip fracture > 3% or a 10-year probability of a major osteoporosis-related fracture > 20% based on the [place]-adapted WHO algorithm d. Clinician judgment and/or patient preferences may indicate treatment for people with 10-year fracture probabilities above or below these levels FOLLOW-UP: People with diagnosed cases of osteoporosis or at high risk for fracture should have regular bone mineral density tests. For patients eligible for Medicare, routine testing is allowed once every 2 years.  The testing frequency can be increased to one year for patients who have rapidly progressing disease, those who are receiving or discontinuing medical therapy to restore bone mass, or have additional risk factors. I have reviewed this report, and agree with the above findings. [facility] Radiology Electronically Signed   By: Lowella Grip III M.D.   On: 05/12/2018 09:27   Dg Chest Port 1 View  Result Date: 05/30/2018 CLINICAL DATA:  Sepsis.  Ovarian cancer. EXAM: PORTABLE CHEST 1 VIEW COMPARISON:  11/04/2016 FINDINGS: The heart size and mediastinal contours are within normal limits. Both lungs are clear. The visualized skeletal structures are unremarkable. IMPRESSION: No active disease. Electronically Signed   By: Lorriane Shire M.D.   On: 05/30/2018 13:32   Dg Abd 2 Views  Result Date: 05/20/2018 CLINICAL DATA:  LLQ pain getting worse since 1 week ago/ the pain in setting of recurrent endometerial cancerHysterectomy 2 years ago Constipation  X days EXAM: ABDOMEN - 2 VIEW COMPARISON:  CT, 01/10/2018 FINDINGS: Normal bowel gas pattern.  No evidence of obstruction. On the initial supine image, there is relative lucency along the right lateral margin of the liver shadow with an apparent irregular contour of the right lateral liver margin. This is of unclear etiology. Stable well-positioned inferior vena cava filter. No evidence of renal or ureteral stones. Clear lung bases.  No acute skeletal abnormality. IMPRESSION: 1. No acute findings.  No evidence of bowel obstruction. 2. Abnormal appearing lucency along the right lateral margin of the liver of unclear etiology. This could potentially be artifact. This could be further assessed with CT. Electronically Signed   By: Lajean Manes M.D.   On: 05/20/2018 12:48    PERFORMANCE STATUS (ECOG) : 4 - Bedbound  REVIEW OF SYSTEMS: As noted above. Otherwise, a complete review of systems is negative.  PHYSICAL EXAM: General: NAD, frail appearing, thin Cardiovascular: regular rate and rhythm Pulmonary: clear ant fields Abdomen: soft, nontender, + bowel sounds GU: no suprapubic tenderness Extremities: 3+ edema left lower extremity Skin: no rashes Neurological: Weakness but otherwise nonfocal  IMPRESSION: Patient known to me from the clinic.  I met with patient, daughter, and granddaughter today to discuss goals.  Together we discussed patient's current medical problems.  Patient says she is in agreement with current scope of treatment.  She is pending transesophageal echocardiogram tomorrow.  Patient is markedly weak and deconditioned.  Patient was previously able to walk and now she is essentially bedbound.  She is interested in PT evaluation when clinically appropriate for consideration of rehab.  Patient had previously verbalized a desire to pursue systemic treatment for the endometrial cancer.  Patient says she would still desire treatment if it would help improve her symptom burden.  However,  she made it clear that her current quality of life is poor and did not seem keen on the idea of aggressive measures if they would not improve her quality of life.  Symptomatically, patient has had severe LLQ and left groin pain most likely from her large pelvic mass, which is been growing in size on repeat imaging.  Patient was previously tried on MS Contin but discontinued it when she started having progressive weakness as she thought that was the cause.  I explained to her today that the weakness was likely multifactorial in the setting of infection and non-STEMI.  Patient would be agreeable to trying a new long-acting opioid.  She says she has had the most relief from PRN oxycodone but describes it is short-lived.  Will start OxyContin.  I did also discuss with her possible outpatient referral to interventional pain management for consideration of a peripheral nerve block.  It seems possible that patient might have some benefit from an iliohypogastric or ilioinguinal nerve block based on the location of the pain.  Note that patient was having severe diarrhea on presentation to the hospital.  Daughter says that patient had been taking a large amount of laxatives, which she thinks precipitated that symptom.  We discussed CODE STATUS.  Patient says that she would not want her life prolonged artificially nor would she want to be resuscitated.  Patient says to just let her be when it is her time.  Daughter says she would agree with patient being a DNR.  Will change CODE STATUS to reflect patient's decision.  PLAN: -Continue current scope of treatment -Continue oxycodone 10 mg every 3 hours as needed for breakthrough pain -Start OxyContin 15 mg every 12 hours -Prophylactic bowel regimen -DNR   Time Total: 50 minutes  Visit consisted of counseling and education dealing with the complex and emotionally intense issues of symptom management and palliative care in the setting of serious and potentially  life-threatening illness.Greater than 50%  of this time was spent counseling and coordinating care related to the above assessment and plan.  Signed by: Altha Harm, PhD, NP-C [phone] (Work Cell)

## 2018-05-31 NOTE — Progress Notes (Signed)
*  PRELIMINARY RESULTS* Echocardiogram 2D Echocardiogram has been performed.  Vickie Barton 05/31/2018, 9:25 AM

## 2018-05-31 NOTE — Telephone Encounter (Signed)
Patient hbospitalized

## 2018-05-31 NOTE — Consult Note (Signed)
Name: Vickie Barton MRN: [id number] DOB: [birth year]    ADMISSION DATE:  05/30/2018 CONSULTATION DATE:  05/30/2018  REFERRING MD : Dr. Estanislado Pandy  CHIEF COMPLAINT: Generalized weakness, nausea, vomiting, diarrhea, fever  BRIEF PATIENT DESCRIPTION:  85 year old female with past medical history notable for endometrial cancer status post chemo and radiation and left lower extremity DVT admitted for sepsis of unknown origin, and for elevated troponin in setting of NSTEMI vs. Demand ischemia.  SIGNIFICANT EVENTS  05/30/2018>> admitted to [facility] stepdown  STUDIES:  CT abdomen & pelvis 05/30/2018>> 6.9 x 4.6 cm mass is noted in left pelvic sidewall and left iliac fossa consistent with ovarian malignancy or metastatic adenopathy. This is slightly enlarged compared to prior exam.Minimal left hydronephrosis is noted which is slightly improved compared to prior exam. No ureteral calculus or definite evidence of external compression is noted. Echocardiogram 05/31/2018>>  CULTURES: Blood x2 3/9>> Urine 3/9>> MRSA PCR 3/9>>Negative Influenza PCR 3/9>> Negative C-difficile 3/9>> Negative GI PCR panel 3/9> Netative  ANTIBIOTICS: Cefepime 3/9>> Vancomycin 3/9>> Flagyl 3/9 x1 dose  HISTORY OF PRESENT ILLNESS:   Vickie Barton is an 85 year old female with a past medical history notable for endometrial cancer status post chemo and radiation therapy, left lower extremity DVT on Eliquis, CHF, hyperlipidemia, hypertension, hypothyroidism who presented to [facility] ED on 05/30/2018 with complaints of generalized weakness, nausea, vomiting, diarrhea, and fever.  Upon presentation she was noted to have abdominal pain, and fever of 103.3 F.  Initial work-up revealed WBC 10.8, lactic acid 7, potassium 2.8, and troponin 7.86.  Chest x-ray is negative, CT abdomen and pelvis concerning for ovarian malignancy without evidence of obstruction (slightly enlarged as compared to previous study).  Influenza PCR is negative, urinalysis  is negative, GI PCR panel and C. difficile negative.  She met criteria for sepsis, therefore she received IV fluids (3L Normal saline) and broad-spectrum antibiotics.  She is admitted to [facility] stepdown unit for treatment of sepsis of unknown etiology, and elevated troponins in the setting of non-STEMI versus demand ischemia.  PCCM is consulted for further management.  PAST MEDICAL HISTORY :   has a past medical history of Arthritis, CHF (congestive heart failure) ([place]), Constipation, DVT (deep venous thrombosis) ([place]) (03/2017), Endometrial cancer ([place]) (01/28/2016), Hyperlipidemia, Hypertension, and Hypothyroidism.  has a past surgical history that includes Tonsillectomy; Hysteroscopy w/D&C (N/A, 01/28/2016); Dilation and curettage of uterus; Laparoscopic hysterectomy (N/A, 02/26/2016); Laparoscopic bilateral salpingo oophorectomy (Bilateral, 02/26/2016); Sentinel node biopsy (N/A, 02/26/2016); Cystoscopy w/ retrogrades (Left, 01/01/2017); Cystoscopy with stent placement (Left, 01/01/2017); Cystoscopy w/ retrogrades (Left, 04/23/2017); and Cystoscopy w/ ureteral stent placement (Left, 04/23/2017). Prior to Admission medications   Medication Sig Start Date End Date Taking? Authorizing Provider  alendronate (FOSAMAX) 70 MG tablet Take 1 tablet (70 mg total) by mouth once a week. Take with a full glass of water on an empty stomach. 05/13/18  Yes [name], MD  amoxicillin-clavulanate (AUGMENTIN) 875-125 MG tablet Take 1 tablet by mouth 2 (two) times daily. 05/27/18  Yes Borders, [name], NP  Calcium Carbonate-Vitamin D (CALCIUM-D) 600-400 MG-UNIT TABS Take 1 tablet by mouth 1 day or 1 dose for 1 dose. 05/13/18 05/30/18 Yes Finnegan, Kathlene November, MD  ELIQUIS 5 MG TABS tablet TAKE 2 TABLETS BY MOUTH TWICE A DAY 05/02/18  Yes [name], MD  furosemide (LASIX) 20 MG tablet Take 20 mg by mouth 2 (two) times daily. 12/05/16  Yes [provider]  levothyroxine (SYNTHROID, LEVOTHROID) 150 MCG tablet  TAKE 150 MCG BY  MOUTH DAILY BEFORE BREAKFAST ON EMPTY STOMACH WITH A GLASS OF WATER 30-60MINS BEFORE BREAKFAST 03/19/17  Yes [provider]  ondansetron (ZOFRAN) 8 MG tablet Take 1 tablet (8 mg total) by mouth every 8 (eight) hours as needed for nausea or vomiting. 05/27/18  Yes Borders, Joshua R, NP  Oxycodone HCl 10 MG TABS Take 1 tablet (10 mg total) by mouth every 3 (three) hours as needed (for breakthrough pain). 05/26/18  Yes Borders, Joshua R, NP   Allergies  Allergen Reactions  . Sulfa Antibiotics Rash  . Zinc Rash    FAMILY HISTORY:  family history includes Diabetes in her father and paternal grandmother; Heart disease in her father and mother; Hypertension in her brother, father, mother, and sister; Stroke in her father and mother; Thyroid disease in her sister. SOCIAL HISTORY:  reports that she has quit smoking. She has never used smokeless tobacco. She reports that she does not drink alcohol or use drugs.  REVIEW OF SYSTEMS: Positives in BOLD: Pt denies all complaints Constitutional: Negative for fever, chills, weight loss, malaise/fatigue and diaphoresis.  HENT: Negative for hearing loss, ear pain, nosebleeds, congestion, sore throat, neck pain, tinnitus and ear discharge.   Eyes: Negative for blurred vision, double vision, photophobia, pain, discharge and redness.  Respiratory: Negative for cough, hemoptysis, sputum production, shortness of breath, wheezing and stridor.   Cardiovascular: Negative for chest pain, palpitations, orthopnea, claudication, leg swelling and PND.  Gastrointestinal: Negative for heartburn, nausea, vomiting, abdominal pain, diarrhea, constipation, blood in stool and melena.  Genitourinary: Negative for dysuria, urgency, frequency, hematuria and flank pain.  Musculoskeletal: Negative for myalgias, back pain, joint pain and falls.  Skin: Negative for itching and rash.  Neurological: Negative for dizziness, tingling, tremors, sensory change, speech  change, focal weakness, seizures, loss of consciousness, weakness and headaches.  Endo/Heme/Allergies: Negative for environmental allergies and polydipsia. Does not bruise/bleed easily.  SUBJECTIVE:  Pt reports difficulty sleeping Reports that left leg is tender and swollen (which is a chronic for her given chronic DVT) Denies any further episodes of Nausea, vomiting, or diarrhea since in ICU Denies chest pain, abdominal pain, shortness of breath, fever, or chills  VITAL SIGNS: Temp:  [97.3 F (36.3 C)-103.3 F (39.6 C)] 98.6 F (37 C) (03/10 0000) Pulse Rate:  [58-119] 79 (03/10 0100) Resp:  [17-34] 17 (03/10 0100) BP: (78-138)/(52-109) 138/75 (03/10 0100) SpO2:  [94 %-100 %] 100 % (03/10 0100) Weight:  [57.6 kg-61.1 kg] 61.1 kg (03/09 1831)  PHYSICAL EXAMINATION: General: Acutely ill-appearing female, laying in bed, nasal cannula, in no acute distress Neuro: Awake, alert and oriented, follows commands, no focal deficits, speech clear HEENT: Atraumatic, normocephalic, neck supple, no JVD Cardiovascular: RRR, S1-S2, no murmurs rubs or gallops, 2+ radial pulses, 1+ pedal pulses Lungs: Clear to auscultation bilaterally, even, nonlabored, normal effort Abdomen: Soft, nontender, nondistended, no guarding or rebound tenderness, bowel sounds positive x4 Musculoskeletal: Normal bulk and tone, no deformities, left lower extremity swollen and tender (patient reports this is chronic for her due to DVT) Skin: Warm and dry, no obvious rashes, lesions, or ulcerations  Recent Labs  Lab 05/26/18 1544 05/30/18 1134 05/31/18 0106  NA 133* 136 137  K 3.6 2.8* 5.2*  CL 95* 99 108  CO2 27 24 20*  BUN 13 14 15  CREATININE 0.79 0.81 0.75  GLUCOSE 108* 131* 102*   Recent Labs  Lab 05/26/18 1544 05/30/18 1134  HGB 9.3* 8.7*  HCT 29.5* 28.1*  WBC 11.2* 10.8*  PLT 500*   397   Ct Abdomen Pelvis W Contrast  Result Date: 05/30/2018 CLINICAL DATA:  Acute generalized abdominal pain. EXAM: CT  ABDOMEN AND PELVIS WITH CONTRAST TECHNIQUE: Multidetector CT imaging of the abdomen and pelvis was performed using the standard protocol following bolus administration of intravenous contrast. CONTRAST:  75mL OMNIPAQUE IOHEXOL 300 MG/ML  SOLN COMPARISON:  CT scan of May 20, 2018. FINDINGS: Lower chest: No acute abnormality. Hepatobiliary: No focal liver abnormality is seen. No gallstones, gallbladder wall thickening, or biliary dilatation. Pancreas: Unremarkable. No pancreatic ductal dilatation or surrounding inflammatory changes. Spleen: Stable lucency seen in the spleen most consistent with hemangioma or cyst. No other splenic abnormality seen. Adrenals/Urinary Tract: Adrenal glands appear normal. Stable right renal cysts are noted. Minimal left hydronephrosis is noted but there is no evidence of obstructing calculus. No definite evidence of ureteral compression is noted. Urinary bladder is unremarkable. Stomach/Bowel: The stomach appears normal. There is no evidence of bowel obstruction or inflammation. The appendix appears unremarkable. Vascular/Lymphatic: Atherosclerosis of abdominal aorta is noted without aneurysm formation. Reproductive: Status post hysterectomy and bilateral oophorectomy. 6.9 x 4.6 cm mass is noted in left side of pelvis and left iliac fossa consistent with ovarian malignancy or metastatic adenopathy. This is slightly enlarged compared to prior exam. Other: No abdominal wall hernia or abnormality. No abdominopelvic ascites. Musculoskeletal: No acute or significant osseous findings. IMPRESSION: 6.9 x 4.6 cm mass is noted in left pelvic sidewall and left iliac fossa consistent with ovarian malignancy or metastatic adenopathy. This is slightly enlarged compared to prior exam. Minimal left hydronephrosis is noted which is slightly improved compared to prior exam. No ureteral calculus or definite evidence of external compression is noted. Aortic Atherosclerosis (ICD10-I70.0). Electronically  Signed   By: James  Green Jr, M.D.   On: 05/30/2018 14:46   Dg Chest Port 1 View  Result Date: 05/30/2018 CLINICAL DATA:  Sepsis.  Ovarian cancer. EXAM: PORTABLE CHEST 1 VIEW COMPARISON:  11/04/2016 FINDINGS: The heart size and mediastinal contours are within normal limits. Both lungs are clear. The visualized skeletal structures are unremarkable. IMPRESSION: No active disease. Electronically Signed   By: James  Maxwell M.D.   On: 05/30/2018 13:32    ASSESSMENT / PLAN:  Sepsis, unknown etiology at this time Lactic Acidosis>>Improved -CXR without infiltrates -Urinalysis negative -CT Abdomen/Pelvis with ovarian maligancy (slightly enlarged compared to previous), and miminmal Left Hydronephrosis with no ureteral calculus or obstructive process -Monitor fever curve -Trend WBC's and Procalcitonin -Follow Blood and urine cultures -GI PCR and C-difficile negative -Continue Vancomycin and Cefepime for now -Received 3L NS boluses in ED (30 cc/kg) -Continue gentle IVF: NS @ 75 ml/hr -Trend lactic acid  Elevated Troponin, NSTEMI vs. Demand ischemia Hx: CHF -Cardiac monitoring -Trend Troponin -Cardiology consulted, appreciate input -Pt on Eliquis, will transition to Heparin drip in case Cardiology wishes to proceed with cardiac cath -Echocardiogram pending  Acute Diarrhea -GI PCR panel and C-difficile negative -Provide supportive Care -Prn Imodium   Anemia without signs of active bleeding -Monitor for S/Sx of bleeding -Trend CBC -Heparin drip for Anticoagulation/VTE Prophylaxis  -Transfuse for Hgb <7  Hypokalemia>>Resolved -Monitor I&O's / urinary output -Follow BMP -Ensure adequate renal perfusion -Avoid nephrotoxic agents as able -Replace electrolytes as indicated  Endometrial/Ovarian Malignancy -Oncology consulted, appreciate input  Hx of LLE DVT -Currently on Eliquis, will transition to Heparin drip given concern for NSTEMI.  After ischemic evaluation by Cardiology, may  place back on Eliquis.  Hx of Hypothyroidism -Continue Home Synthroid     Disposition:   Stepdown Goals of care: Full code VTE prophylaxis: Heparin drip Updates: Updated patient at bedside 05/31/2018  Jeremiah Keene, AGACNP-BC [facility] Pager: [phone] Cell: [phone]  05/31/2018, 2:35 AM  

## 2018-05-31 NOTE — Progress Notes (Signed)
Transferred to 232 via bed. Report to [facility].

## 2018-06-01 ENCOUNTER — Inpatient Hospital Stay: Payer: Medicare Other

## 2018-06-01 ENCOUNTER — Encounter: Payer: Self-pay | Admitting: *Deleted

## 2018-06-01 ENCOUNTER — Other Ambulatory Visit: Payer: Self-pay

## 2018-06-01 ENCOUNTER — Inpatient Hospital Stay (HOSPITAL_COMMUNITY)
Admission: EM | Admit: 2018-06-01 | Discharge: 2018-06-01 | Disposition: A | Discharge status: Home / Self Care | Payer: Medicare Other | Source: Home / Self Care | Attending: Cardiovascular Disease | Admitting: Cardiovascular Disease

## 2018-06-01 ENCOUNTER — Encounter
Admission: EM | Disposition: A | Discharge status: Skilled Nursing Facility | Payer: Self-pay | Source: Home / Self Care | Attending: Internal Medicine

## 2018-06-01 DIAGNOSIS — I330 Acute and subacute infective endocarditis: Secondary | ICD-10-CM

## 2018-06-01 HISTORY — PX: TEE WITHOUT CARDIOVERSION: SHX5443

## 2018-06-01 LAB — COMPREHENSIVE METABOLIC PANEL
ALT: 18 U/L (ref 0–44)
AST: 31 U/L (ref 15–41)
Albumin: 1.8 g/dL — ABNORMAL LOW (ref 3.5–5.0)
Alkaline Phosphatase: 58 U/L (ref 38–126)
Anion gap: 6 (ref 5–15)
BUN: 14 mg/dL (ref 8–23)
CO2: 19 mmol/L — ABNORMAL LOW (ref 22–32)
Calcium: 7.8 mg/dL — ABNORMAL LOW (ref 8.9–10.3)
Chloride: 108 mmol/L (ref 98–111)
Creatinine, Ser: 0.73 mg/dL (ref 0.44–1.00)
GFR calc non Af Amer: >60 mL/min (ref >60)
Glucose, Bld: 101 mg/dL — ABNORMAL HIGH (ref 70–99)
Potassium: 4.1 mmol/L (ref 3.5–5.1)
Sodium: 133 mmol/L — ABNORMAL LOW (ref 135–145)
Total Bilirubin: 0.5 mg/dL (ref 0.3–1.2)
Total Protein: 5.3 g/dL — ABNORMAL LOW (ref 6.5–8.1)

## 2018-06-01 LAB — CBC
HEMATOCRIT: 24.7 % — AB (ref 36.0–46.0)
Hemoglobin: 7.6 g/dL — ABNORMAL LOW (ref 12.0–15.0)
MCH: 27.2 pg (ref 26.0–34.0)
MCHC: 30.8 g/dL (ref 30.0–36.0)
MCV: 88.5 fL (ref 80.0–100.0)
NRBC: 0 % (ref 0.0–0.2)
Platelets: 380 10*3/uL (ref 150–400)
RBC: 2.79 MIL/uL — ABNORMAL LOW (ref 3.87–5.11)
RDW: 14 % (ref 11.5–15.5)
WBC: 10.7 10*3/uL — ABNORMAL HIGH (ref 4.0–10.5)

## 2018-06-01 LAB — HEPARIN LEVEL (UNFRACTIONATED): Heparin Unfractionated: 2.98 IU/mL — ABNORMAL HIGH (ref 0.30–0.70)

## 2018-06-01 LAB — APTT
aPTT: 59 seconds — ABNORMAL HIGH (ref 24–36)
aPTT: 62 seconds — ABNORMAL HIGH (ref 24–36)

## 2018-06-01 LAB — PROCALCITONIN: Procalcitonin: 20.61 ng/mL

## 2018-06-01 SURGERY — ECHOCARDIOGRAM, TRANSESOPHAGEAL
Anesthesia: Moderate Sedation

## 2018-06-01 MED ORDER — MIDAZOLAM HCL 5 MG/5ML IJ SOLN
INTRAMUSCULAR | Status: AC
  Filled 2018-06-01: qty 10

## 2018-06-01 MED ORDER — HEPARIN (PORCINE) 25000 UT/250ML-% IV SOLN
1000.0000 [IU]/h | INTRAVENOUS | Status: AC
  Administered 2018-06-01: 900 [IU]/h via INTRAVENOUS
  Administered 2018-06-02: 1000 [IU]/h via INTRAVENOUS
  Filled 2018-06-01 (×2): qty 250

## 2018-06-01 MED ORDER — LIDOCAINE VISCOUS HCL 2 % MT SOLN
OROMUCOSAL | Status: AC
  Administered 2018-06-01: 15 mL
  Filled 2018-06-01: qty 15

## 2018-06-01 MED ORDER — BUTAMBEN-TETRACAINE-BENZOCAINE 2-2-14 % EX AERO
INHALATION_SPRAY | CUTANEOUS | Status: AC
  Administered 2018-06-01: 1
  Filled 2018-06-01: qty 5

## 2018-06-01 MED ORDER — MIDAZOLAM HCL 2 MG/2ML IJ SOLN
INTRAMUSCULAR | Status: AC | PRN
  Administered 2018-06-01 (×4): 1 mg via INTRAVENOUS

## 2018-06-01 MED ORDER — FENTANYL CITRATE (PF) 100 MCG/2ML IJ SOLN
INTRAMUSCULAR | Status: AC | PRN
  Administered 2018-06-01 (×5): 25 ug via INTRAVENOUS

## 2018-06-01 MED ORDER — FENTANYL CITRATE (PF) 100 MCG/2ML IJ SOLN
INTRAMUSCULAR | Status: AC
  Filled 2018-06-01: qty 4

## 2018-06-01 MED ORDER — MIDAZOLAM HCL 5 MG/5ML IJ SOLN
INTRAMUSCULAR | Status: AC | PRN
  Administered 2018-06-01: 1 mg via INTRAVENOUS

## 2018-06-01 NOTE — Progress Notes (Signed)
[place] for heparin drip Indication: chest pain/ACS  Allergies  Allergen Reactions  . Sulfa Antibiotics Rash  . Zinc Rash    Patient Measurements: Height: 5\' 2"  (157.5 cm) Weight: 135 lb (61.2 kg) IBW/kg (Calculated) : 50.1 Heparin Dosing Weight: 53 kg  Vital Signs: Temp: 99.9 F (37.7 C) (03/11 2029) Temp Source: Oral (03/11 2029) BP: 105/60 (03/11 2029) Pulse Rate: 72 (03/11 2029)  Labs: Recent Labs    05/30/18 1134 05/30/18 1848 05/31/18 0106 05/31/18 0353 05/31/18 1025  05/31/18 1824 06/01/18 0259 06/01/18 1953  HGB 8.7*  --   --  7.6*  --   --   --  7.6*  --   HCT 28.1*  --   --  24.9*  --   --   --  24.7*  --   PLT 397  --   --  396  --   --   --  380  --   APTT  --   --   --  58*  --    < > 53* 59* 62*  LABPROT  --   --   --  32.2*  --   --   --   --   --   INR  --   --   --  3.2*  --   --   --   --   --   HEPARINUNFRC  --   --   --  >3.60*  --   --   --  2.98*  --   CREATININE 0.81  --  0.75 0.68  --   --   --  0.73  --   TROPONINI  --  7.86* 6.36*  --  5.76*  --   --   --   --    < > = values in this interval not displayed.    Estimated Creatinine Clearance: 44.2 mL/min (by C-G formula based on SCr of 0.73 mg/dL).   Medical History: Past Medical History:  Diagnosis Date  . Arthritis   . CHF (congestive heart failure) ([place])   . Constipation   . DVT (deep venous thrombosis) ([place]) 03/2017   left leg  . Endometrial cancer ([place]) 01/28/2016   Hysterectomy, chemo + rad tx's, also internal brachytherapy on 02/2018.   [name] Hyperlipidemia   . Hypertension   . Hypothyroidism     Medications:  Scheduled:  . aspirin EC  81 mg Oral Daily  . atorvastatin  10 mg Oral q1800  . calcium-vitamin D  1 tablet Oral Daily  . fentaNYL      . levothyroxine  150 mcg Oral Q0600  . metoprolol tartrate  12.5 mg Oral BID  . midazolam      . oxyCODONE  15 mg Oral Q12H    Assessment: Patient admitted for fever, weakness w/ h/o  ovarian malignancy and h/o DVT anticoagulated w/ eliquis. On arrival trops were 7.86 >> 6.36; EKG showing ST elevation in leads V II, III, and IV. Patient received the last dose of eliquis 10 mg on 03/09 @ 2200. Heparin is currently infusing at 700 units/hr.   3/10 @1151  aPTT 72 @ 700 units/hr 3/10 @ 1824 aPTT 53 @ 700 units/hr 3/11 @ 0300 aPTT 59 @ 800 units/hr subtherapeutic rate increased to 900 units/hr 3/11 @ 1953 aPTT 62 @ 900 units/hr  Goal of Therapy:  APTT 66 - 102 seconds  Heparin level 0.3-0.7 units/ml once heparin level and aPTT correlate.  Monitor  platelets by anticoagulation protocol: Yes   Plan:  Will increase Heparin drip to 1000 units/hr. Will recheck aPTT and HL at 04:00. Daily CBC.  Paulina Fusi, PharmD, BCPS 06/01/2018 8:38 PM

## 2018-06-01 NOTE — Progress Notes (Signed)
[place] [facility] [place] NAME: Vickie Barton    MR#:  [id number]  DATE OF BIRTH:  [birth year]  SUBJECTIVE:  CHIEF COMPLAINT:   Chief Complaint  Patient presents with  . Fever  . Weakness  . Nausea  . Emesis   No new complaints this morning.  Patient had TEE done by cardiologist. No fevers.  No nausea or vomiting.  REVIEW OF SYSTEMS:  Review of Systems  Constitutional: Negative for chills and fever.  HENT: Negative for hearing loss and tinnitus.   Eyes: Negative for blurred vision and double vision.  Respiratory: Negative for cough and hemoptysis.   Cardiovascular: Negative for chest pain and palpitations.  Gastrointestinal: Negative for heartburn, nausea and vomiting.  Genitourinary: Negative for dysuria and urgency.  Musculoskeletal: Negative for myalgias and neck pain.  Skin: Negative for itching and rash.  Neurological: Negative for dizziness and headaches.  Psychiatric/Behavioral: Negative for depression and hallucinations.      DRUG ALLERGIES:   Allergies  Allergen Reactions  . Sulfa Antibiotics Rash  . Zinc Rash   VITALS:  Blood pressure (!) 103/54, pulse 65, temperature 97.7 F (36.5 C), temperature source Oral, resp. rate (!) 21, height 5\' 2"  (1.575 m), weight 61.2 kg, SpO2 97 %. PHYSICAL EXAMINATION:  Physical Exam  GENERAL:  85 y.o.-year-old patient lying in the bed with no acute distress.  EYES: Pupils equal, round, reactive to light and accommodation. No scleral icterus. Extraocular muscles intact.  HEENT: Head atraumatic, normocephalic. Oropharynx and nasopharynx clear.  NECK:  Supple, no jugular venous distention. No thyroid enlargement, no tenderness.  LUNGS: Normal breath sounds bilaterally, no wheezing, rales,rhonchi or crepitation. No use of accessory muscles of respiration.  CARDIOVASCULAR: S1, S2 normal. No murmurs, rubs, or gallops.  ABDOMEN: Soft, No tenderness , nondistended. Bowel sounds present.  No organomegaly or mass.  EXTREMITIES: No pedal edema, cyanosis, or clubbing.  NEUROLOGIC: Cranial nerves II through XII are intact. Muscle strength 5/5 in all extremities. Sensation intact. Gait not checked.  PSYCHIATRIC: The patient is alert and oriented x 3.  SKIN: No obvious rash, lesion, or ulcer.  LABORATORY PANEL:  Female CBC Recent Labs  Lab 06/01/18 0259  WBC 10.7*  HGB 7.6*  HCT 24.7*  PLT 380   ------------------------------------------------------------------------------------------------------------------ Chemistries  Recent Labs  Lab 05/31/18 0106  06/01/18 0259  NA 137   < > 133*  K 5.2*   < > 4.1  CL 108   < > 108  CO2 20*   < > 19*  GLUCOSE 102*   < > 101*  BUN 15   < > 14  CREATININE 0.75   < > 0.73  CALCIUM 7.6*   < > 7.8*  MG 1.8  --   --   AST  --   --  31  ALT  --   --  18  ALKPHOS  --   --  58  BILITOT  --   --  0.5   < > = values in this interval not displayed.   RADIOLOGY:  No results found. ASSESSMENT AND PLAN:   85 year old female patient with a known history of DVT in the left leg, endometrial cancer, status post chemotherapy and radiation therapy, hypertension, hypothyroidism, chronic congestive heart failure presented to the emergency room for generalized weakness, nausea and vomiting and diarrhea.  Diagnosed with sepsis.  Etiology not yet determined  1. Sepsis Unknown etiology.  Patient was initially managed in the ICU and  subsequently transferred to the medical floor.  Patient on broad-spectrum IV antibiotics with vancomycin and cefepime  Follow-up on cultures.  Noted elevated procalcitonin level concerning for bacterial infection.  Follow-up on blood cultures  2.  Non-ST MI elevation Patient currently on heparin drip.  Denies any chest pain. 2D echocardiogram done with evidence of ischemic cardiomyopathy with ejection fraction of 30 to 35%.  Unable to exclude underlying ischemia. Patient had TEE done this morning due to concern to  rule out endocarditis..  No valvular vegetation noted as such endocarditis ruled out.  Ejection fraction of 30 to 35% noted with severe hypokinesis of the left ventricle. Cardiologist confirmed that patient is not a candidate for cardiac catheterization at this time due to anemia and unable to reposition patient secondary to severe pains..  Continue medical management.  Continue aspirin and beta-blocker.  Add statins.  3.  Chronic systolic CHF Patient with heart failure with reduced ejection fraction.  Stable.  4.  Mild hyperkalemia Resolved  5.Acute diarrhea self-limiting; appears resolved C. difficile negative.  6.Endometrial and ovarian malignancy Patient seen by oncologist.  Given declining performance status and multiple medical problems including cardiac issues, patient currently not a candidate for additional chemotherapy.  If performance status improves, they will reconsider in the future Oncology follow-up  7. History of DVT left leg Patient was on Eliquis prior to admission which is currently on hold due to patient being on heparin drip at this time due to management of non-ST MI elevation  8.  Anemia Etiology not clear.  Noted drop in hemoglobin to 7.6. Clinically no evidence of bleeding.  Iron studies requested. Follow-up CBC in a.m.  DVT prophylaxis; patient currently on heparin drip Physical therapist to evaluate and treat.  Patient will likely benefit from skilled nursing facility placement.  All the records are reviewed and case discussed with Care Management/Social Worker. Management plans discussed with the patient, family and they are in agreement.  CODE STATUS: DNR  TOTAL TIME TAKING CARE OF THIS PATIENT: 35 minutes.   More than 50% of the time was spent in counseling/coordination of care: YES  POSSIBLE D/C IN 2 DAYS, DEPENDING ON CLINICAL CONDITION.   Vickie Barton M.D on 06/01/2018 at 1:46 PM  Between 7am to 6pm - Pager - [phone]  After 6pm go to  [email or web address] - Proofreader  [facility]  Office  [phone]  CC: Primary care physician; Vickie Pink, MD  Note: This dictation was prepared with Dragon dictation along with smaller phrase technology. Any transcriptional errors that result from this process are unintentional.

## 2018-06-01 NOTE — Progress Notes (Addendum)
[place] for heparin drip Indication: chest pain/ACS  Allergies  Allergen Reactions  . Sulfa Antibiotics Rash  . Zinc Rash    Patient Measurements: Height: 5\' 2"  (157.5 cm) Weight: 140 lb 3.2 oz (63.6 kg) IBW/kg (Calculated) : 50.1 Heparin Dosing Weight: 53 kg  Vital Signs: Temp: 98.6 F (37 C) (03/11 0304) Temp Source: Oral (03/10 1936) BP: 101/56 (03/11 0304) Pulse Rate: 66 (03/11 0304)  Labs: Recent Labs    05/30/18 1134 05/30/18 1848 05/31/18 0106  05/31/18 0353 05/31/18 9211 05/31/18 1151 05/31/18 1824 06/01/18 0259  HGB 8.7*  --   --   --  7.6*  --   --   --  7.6*  HCT 28.1*  --   --   --  24.9*  --   --   --  24.7*  PLT 397  --   --   --  396  --   --   --  380  APTT  --   --   --    < > 58*  --  72* 53* 59*  LABPROT  --   --   --   --  32.2*  --   --   --   --   INR  --   --   --   --  3.2*  --   --   --   --   HEPARINUNFRC  --   --   --   --  >3.60*  --   --   --   --   CREATININE 0.81  --  0.75  --  0.68  --   --   --  0.73  TROPONINI  --  7.86* 6.36*  --   --  5.76*  --   --   --    < > = values in this interval not displayed.    Estimated Creatinine Clearance: 45 mL/min (by C-G formula based on SCr of 0.73 mg/dL).   Medical History: Past Medical History:  Diagnosis Date  . Arthritis   . CHF (congestive heart failure) ([place])   . Constipation   . DVT (deep venous thrombosis) ([place]) 03/2017   left leg  . Endometrial cancer ([place]) 01/28/2016   Hysterectomy, chemo + rad tx's, also internal brachytherapy on 02/2018.   [name] Hyperlipidemia   . Hypertension   . Hypothyroidism     Medications:  Scheduled:  . aspirin EC  81 mg Oral Daily  . atorvastatin  10 mg Oral q1800  . calcium-vitamin D  1 tablet Oral Daily  . levothyroxine  150 mcg Oral Q0600  . metoprolol tartrate  12.5 mg Oral BID  . oxyCODONE  15 mg Oral Q12H    Assessment: Patient admitted for fever, weakness w/ h/o ovarian malignancy and h/o DVT  anticoagulated w/ eliquis. On arrival trops were 7.86 >> 6.36; EKG showing ST elevation in leads V II, III, and IV. Patient received the last dose of eliquis 10 mg on 03/09 @ 2200. Heparin is currently infusing at 700 units/hr.   3/10 @1151  aPTT 72 3/10 @ 1824 aPTT 53 subtherapeutic will increase rate   Goal of Therapy:  APTT 66 - 102 seconds  Heparin level 0.3-0.7 units/ml once heparin level and aPTT correlate.  Monitor platelets by anticoagulation protocol: Yes   Plan:  03/11 @ 0300 aPTT 59 seconds subtherapeutic. HL 2.98 still supratherapeutic from eliquis. Will increase rate to 900 units/hr without bolus considering patient's hgb  is near 7.0. Will recheck aPTT @ 1000, HL w/ am labs, will continue to monitor CBC.  Tobie Lords, PharmD, BCPS Clinical Pharmacist 06/01/2018

## 2018-06-01 NOTE — NC FL2 (Signed)
[place] LEVEL OF CARE SCREENING TOOL     IDENTIFICATION  Patient Name: Vickie Barton Birthdate: 01-09-1931 Sex: female Admission Date (Current Location): 05/30/2018  [place] and [place] Number:  Engineering geologist and Address:  [facility], [address], [place], [place] [address]      Provider Number: [id number]  Attending Physician Name and Address:  Otila Back, MD  Relative Name and Phone Number:  Johnnette Litter Daughter [phone];  [phone]     Current Level of Care: Hospital Recommended Level of Care: [place] Prior Approval Number:    Date Approved/Denied:   PASRR Number: [id number]  Discharge Plan: SNF    Current Diagnoses: Patient Active Problem List   Diagnosis Date Noted  . Neoplasm related pain   . Palliative care encounter   . Sepsis ([place]) 05/30/2018  . History of DVT (deep vein thrombosis) 05/24/2018  . Pain of metastatic malignancy 05/20/2018  . Goals of care, counseling/discussion 01/02/2017  . CAP (community acquired pneumonia) 10/24/2016  . Endometrial cancer ([place]) 02/19/2016    Orientation RESPIRATION BLADDER Height & Weight     Self, Time, Situation, Place  Other (Comment)(4L oxygen nasal canula) External catheter, Incontinent Weight: 61.2 kg Height:  5\' 2"  (157.5 cm)  BEHAVIORAL SYMPTOMS/MOOD NEUROLOGICAL BOWEL NUTRITION STATUS  (calm; cooperative)   Continent Diet(heart healthy; thin fluids)  AMBULATORY STATUS COMMUNICATION OF NEEDS Skin   Limited Assist Verbally Normal                       Personal Care Assistance Level of Assistance  Bathing, Feeding, Dressing Bathing Assistance: Limited assistance Feeding assistance: Independent Dressing Assistance: Limited assistance     Functional Limitations Info  Sight, Hearing, Speech Sight Info: Adequate Hearing Info: Adequate Speech Info: Adequate    SPECIAL CARE FACTORS FREQUENCY  PT (By licensed PT)     PT  Frequency: 5 X week              Contractures Contractures Info: Not present    Additional Factors Info  Code Status, Allergies Code Status Info: DNR Allergies Info: Sulfa-hives; Zinc-hives           Current Medications (06/01/2018):  This is the current hospital active medication list Current Facility-Administered Medications  Medication Dose Route Frequency Provider Last Rate Last Dose  . acetaminophen (TYLENOL) tablet 650 mg  650 mg Oral Q6H PRN Pyreddy, Reatha Harps, MD       Or  . acetaminophen (TYLENOL) suppository 650 mg  650 mg Rectal Q6H PRN Pyreddy, Reatha Harps, MD      . aspirin EC tablet 81 mg  81 mg Oral Daily [name], MD   81 mg at 06/01/18 1240  . atorvastatin (LIPITOR) tablet 10 mg  10 mg Oral q1800 [name], MD   10 mg at 05/31/18 1818  . calcium-vitamin D (OSCAL WITH D) 500-200 MG-UNIT per tablet 1 tablet  1 tablet Oral Daily Pyreddy, Reatha Harps, MD   1 tablet at 06/01/18 1239  . ceFEPIme (MAXIPIME) 2 g in sodium chloride 0.9 % 100 mL IVPB  2 g Intravenous Q24H Pyreddy, Reatha Harps, MD 200 mL/hr at 06/01/18 1353 2 g at 06/01/18 1353  . fentaNYL (SUBLIMAZE) 100 MCG/2ML injection           . heparin ADULT infusion 100 units/mL (25000 units/27mL sodium chloride 0.45%)  900 Units/hr Intravenous Continuous [name], MD 9 mL/hr at 06/01/18 1233 900 Units/hr at 06/01/18 1233  .  levothyroxine (SYNTHROID, LEVOTHROID) tablet 150 mcg  150 mcg Oral Q0600 Darel Hong D, NP   150 mcg at 06/01/18 1610  . metoprolol tartrate (LOPRESSOR) tablet 12.5 mg  12.5 mg Oral BID [name], MD   12.5 mg at 06/01/18 1238  . midazolam (VERSED) 5 MG/5ML injection           . morphine 2 MG/ML injection 2 mg  2 mg Intravenous Q2H PRN Flora Lipps, MD      . ondansetron (ZOFRAN) injection 4 mg  4 mg Intravenous Q6H PRN Pyreddy, Pavan, MD      . oxyCODONE (Oxy IR/ROXICODONE) immediate release tablet 10 mg  10 mg Oral Q3H PRN Saundra Shelling, MD   10 mg at 05/31/18 1955  .  oxyCODONE (OXYCONTIN) 12 hr tablet 15 mg  15 mg Oral Q12H Borders, [name], NP   15 mg at 06/01/18 1239  . vancomycin (VANCOCIN) IVPB 750 mg/150 ml premix  750 mg Intravenous Q24H Pyreddy, Reatha Harps, MD 150 mL/hr at 05/31/18 1033 750 mg at 05/31/18 1033     Discharge Medications: Please see discharge summary for a list of discharge medications.  Relevant Imaging Results:  Relevant Lab Results:   Additional Information SSN [id number]  Elza Rafter, RN

## 2018-06-01 NOTE — Progress Notes (Signed)
[place] for heparin drip Indication: chest pain/ACS  Allergies  Allergen Reactions  . Sulfa Antibiotics Rash  . Zinc Rash    Patient Measurements: Height: 5\' 2"  (157.5 cm) Weight: 135 lb (61.2 kg) IBW/kg (Calculated) : 50.1 Heparin Dosing Weight: 53 kg  Vital Signs: Temp: 97.7 F (36.5 C) (03/11 0918) Temp Source: Oral (03/11 0918) BP: 125/62 (03/11 1115) Pulse Rate: 66 (03/11 1100)  Labs: Recent Labs    05/30/18 1134 05/30/18 1848 05/31/18 0106  05/31/18 0353 05/31/18 0633 05/31/18 1151 05/31/18 1824 06/01/18 0259  HGB 8.7*  --   --   --  7.6*  --   --   --  7.6*  HCT 28.1*  --   --   --  24.9*  --   --   --  24.7*  PLT 397  --   --   --  396  --   --   --  380  APTT  --   --   --    < > 58*  --  72* 53* 59*  LABPROT  --   --   --   --  32.2*  --   --   --   --   INR  --   --   --   --  3.2*  --   --   --   --   HEPARINUNFRC  --   --   --   --  >3.60*  --   --   --  2.98*  CREATININE 0.81  --  0.75  --  0.68  --   --   --  0.73  TROPONINI  --  7.86* 6.36*  --   --  5.76*  --   --   --    < > = values in this interval not displayed.    Estimated Creatinine Clearance: 44.2 mL/min (by C-G formula based on SCr of 0.73 mg/dL).   Medical History: Past Medical History:  Diagnosis Date  . Arthritis   . CHF (congestive heart failure) ([place])   . Constipation   . DVT (deep venous thrombosis) ([place]) 03/2017   left leg  . Endometrial cancer ([place]) 01/28/2016   Hysterectomy, chemo + rad tx's, also internal brachytherapy on 02/2018.   [name] Hyperlipidemia   . Hypertension   . Hypothyroidism     Medications:  Scheduled:  . aspirin EC  81 mg Oral Daily  . atorvastatin  10 mg Oral q1800  . calcium-vitamin D  1 tablet Oral Daily  . fentaNYL      . levothyroxine  150 mcg Oral Q0600  . metoprolol tartrate  12.5 mg Oral BID  . midazolam      . oxyCODONE  15 mg Oral Q12H    Assessment: Patient admitted for fever, weakness w/ h/o  ovarian malignancy and h/o DVT anticoagulated w/ eliquis. On arrival trops were 7.86 >> 6.36; EKG showing ST elevation in leads V II, III, and IV. Patient received the last dose of eliquis 10 mg on 03/09 @ 2200. Heparin is currently infusing at 700 units/hr.   3/10 @1151  aPTT 72 @ 700 units/hr 3/10 @ 1824 aPTT 53 @ 700 units/hr 3/11 @ 0300 aPTT 59 @ 800 units/hr subtherapeutic rate increased to 900 units/hr  Goal of Therapy:  APTT 66 - 102 seconds  Heparin level 0.3-0.7 units/ml once heparin level and aPTT correlate.  Monitor platelets by anticoagulation protocol: Yes   Plan:  HL 2.98 still supratherapeutic from eliquis. Will recheck HL with 3/12 AM labs - will continue to monitor and adjust per aPTT in the meantime.  Heparin drip was interrupted during procedure (TEE) 3/11 from ~4417~1278   Heparin drip WAS resumed as pt HGB still too low to resume home Eliquis.  Heparin was resumed at previous rate of 900 units/hr   Will recheck aPTT @ 2000 per protocol (without bolus per HGB concerns and elevated anti-Xa level)   Lu Duffel, PharmD, BCPS Clinical Pharmacist 06/01/2018 12:18 PM

## 2018-06-01 NOTE — Progress Notes (Signed)
Progress Note  Patient Name: Vickie Barton Date of Encounter: 06/01/2018  Primary Cardiologist: Ida Rogue, MD , new to [facility]  Subjective   Severe pain left leg Denies chest pain or shortness of breath Relatively immobile in bed Seen by palliative care yesterday, agreed to have TEE today  TEE performed showing no endocarditis Relatively intact cardiac valves Anterior wall, anteroseptal and apical region hypokinetic/regions of akinesis  Inpatient Medications    Scheduled Meds: . [MAR Hold] aspirin EC  81 mg Oral Daily  . [MAR Hold] atorvastatin  10 mg Oral q1800  . [MAR Hold] calcium-vitamin D  1 tablet Oral Daily  . fentaNYL      . [MAR Hold] levothyroxine  150 mcg Oral Q0600  . [MAR Hold] metoprolol tartrate  12.5 mg Oral BID  . midazolam      . [MAR Hold] oxyCODONE  15 mg Oral Q12H   Continuous Infusions: . sodium chloride 20 mL/hr at 06/01/18 0113  . [MAR Hold] ceFEPime (MAXIPIME) IV 2 g (05/31/18 0941)  . heparin    . [facility] Hold] vancomycin 750 mg (05/31/18 1033)   PRN Meds: [MAR Hold] acetaminophen **OR** [MAR Hold] acetaminophen, [MAR Hold]  morphine injection, [DISCONTINUED] ondansetron **OR** [MAR Hold] ondansetron (ZOFRAN) IV, [MAR Hold] oxyCODONE   Vital Signs    Vitals:   06/01/18 1025 06/01/18 1030 06/01/18 1045 06/01/18 1100  BP: 106/67 (!) 94/56 (!) 111/57 117/66  Pulse: 67 61 63 66  Resp: 16 19 15 19   Temp:      TempSrc:      SpO2: 97% 97% 97% 97%  Weight:      Height:        Intake/Output Summary (Last 24 hours) at 06/01/2018 1124 Last data filed at 06/01/2018 0845 Gross per 24 hour  Intake 636.86 ml  Output 1400 ml  Net -763.14 ml   Last 3 Weights 06/01/2018 06/01/2018 05/31/2018  Weight (lbs) 135 lb 140 lb 3.2 oz 135 lb 9.6 oz  Weight (kg) 61.236 kg 63.594 kg 61.508 kg      Telemetry    Normal sinus rhythm- Personally Reviewed  ECG     - Personally Reviewed  Physical Exam   GEN: No acute distress.   Neck: No JVD  Cardiac: RRR, no murmurs, rubs, or gallops.  Respiratory: Clear to auscultation bilaterally. GI: Soft, nontender, non-distended  MS: No edema; No deformity. Neuro:  Nonfocal  Psych: Normal affect   Labs    Chemistry Recent Labs  Lab 05/26/18 1544 05/30/18 1134 05/31/18 0106 05/31/18 0353 06/01/18 0259  NA 133* 136 137 136 133*  K 3.6 2.8* 5.2* 5.2* 4.1  CL 95* 99 108 109 108  CO2 27 24 20* 20* 19*  GLUCOSE 108* 131* 102* 99 101*  BUN 13 14 15 15 14   CREATININE 0.79 0.81 0.75 0.68 0.73  CALCIUM 8.2* 7.9* 7.6* 7.8* 7.8*  PROT 8.0 6.4*  --   --  5.3*  ALBUMIN 3.0* 2.2*  --   --  1.8*  AST 29 47*  --   --  31  ALT 17 20  --   --  18  ALKPHOS 70 73  --   --  58  BILITOT 0.4 0.7  --   --  0.5  GFRNONAA >60 >60 >60 >60 >60  GFRAA >60 >60 >60 >60 >60  ANIONGAP 11 13 9 7 6      Hematology Recent Labs  Lab 05/30/18 1134 05/31/18 0353 06/01/18 0259  WBC 10.8*  14.4* 10.7*  RBC 3.21* 2.80* 2.79*  HGB 8.7* 7.6* 7.6*  HCT 28.1* 24.9* 24.7*  MCV 87.5 88.9 88.5  MCH 27.1 27.1 27.2  MCHC 31.0 30.5 30.8  RDW 13.4 13.9 14.0  PLT 397 396 380    Cardiac Enzymes Recent Labs  Lab 05/30/18 1848 05/31/18 0106 05/31/18 0633  TROPONINI 7.86* 6.36* 5.76*   No results for input(s): TROPIPOC in the last 168 hours.   BNPNo results for input(s): BNP, PROBNP in the last 168 hours.   DDimer No results for input(s): DDIMER in the last 168 hours.   Radiology    Ct Abdomen Pelvis W Contrast  Result Date: 05/30/2018 CLINICAL DATA:  Acute generalized abdominal pain. EXAM: CT ABDOMEN AND PELVIS WITH CONTRAST TECHNIQUE: Multidetector CT imaging of the abdomen and pelvis was performed using the standard protocol following bolus administration of intravenous contrast. CONTRAST:  68mL OMNIPAQUE IOHEXOL 300 MG/ML  SOLN COMPARISON:  CT scan of May 20, 2018. FINDINGS: Lower chest: No acute abnormality. Hepatobiliary: No focal liver abnormality is seen. No gallstones, gallbladder wall  thickening, or biliary dilatation. Pancreas: Unremarkable. No pancreatic ductal dilatation or surrounding inflammatory changes. Spleen: Stable lucency seen in the spleen most consistent with hemangioma or cyst. No other splenic abnormality seen. Adrenals/Urinary Tract: Adrenal glands appear normal. Stable right renal cysts are noted. Minimal left hydronephrosis is noted but there is no evidence of obstructing calculus. No definite evidence of ureteral compression is noted. Urinary bladder is unremarkable. Stomach/Bowel: The stomach appears normal. There is no evidence of bowel obstruction or inflammation. The appendix appears unremarkable. Vascular/Lymphatic: Atherosclerosis of abdominal aorta is noted without aneurysm formation. Reproductive: Status post hysterectomy and bilateral oophorectomy. 6.9 x 4.6 cm mass is noted in left side of pelvis and left iliac fossa consistent with ovarian malignancy or metastatic adenopathy. This is slightly enlarged compared to prior exam. Other: No abdominal wall hernia or abnormality. No abdominopelvic ascites. Musculoskeletal: No acute or significant osseous findings. IMPRESSION: 6.9 x 4.6 cm mass is noted in left pelvic sidewall and left iliac fossa consistent with ovarian malignancy or metastatic adenopathy. This is slightly enlarged compared to prior exam. Minimal left hydronephrosis is noted which is slightly improved compared to prior exam. No ureteral calculus or definite evidence of external compression is noted. Aortic Atherosclerosis (ICD10-I70.0). Electronically Signed   By: Marijo Conception, M.D.   On: 05/30/2018 14:46   Dg Chest Port 1 View  Result Date: 05/30/2018 CLINICAL DATA:  Sepsis.  Ovarian cancer. EXAM: PORTABLE CHEST 1 VIEW COMPARISON:  11/04/2016 FINDINGS: The heart size and mediastinal contours are within normal limits. Both lungs are clear. The visualized skeletal structures are unremarkable. IMPRESSION: No active disease. Electronically Signed   By:  Lorriane Shire M.D.   On: 05/30/2018 13:32    Cardiac Studies   Echocardiogram performed yesterday  1. The left ventricle has moderate-severely reduced systolic function, with an ejection fraction of 30-35%. The cavity size was normal. Left ventricular diastolic Doppler parameters are consistent with impaired relaxation.  2. Severe hypokinesis of the left ventricular, entire septal wall, anteroseptal wall, anterior wall and apical segment.  3. The right ventricle has normal systolic function. The cavity was normal. There is no increase in right ventricular wall thickness.Unable to estimated RVSP  4. Tricuspid valve with concern for mobile 1.7 x 1.4 cm opacity. Unable to exclude vegetation. Consider TEE if clinically indicated.   Preliminary TEE report performed today Ejection fraction 30 to 35%, No endocarditis Anterior, anteroseptal and apical  region hypokinesis   Patient Profile     85 y.o. female   Assessment & Plan    A/P: NSTEMI EKG concerning for old anterior MI Echocardiogram with ischemic cardiomyopathy ejection fraction 30 to 35%, anterior wall hypokinetic consistent with EKG -Suspect old MI though unable to exclude underlying ischemia On heparin infusion  -Aspirin 81 mg daily, metoprolol tartrate 12.5 mg twice daily, consider statin ----Poor candidate for ischemic work-up.  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  Adjuvant treatment was recommended at that time, but patient refused on multiple occations and missed multiple follow-up  completed 6 cycles of carboplatinum and Taxol on April 21, 2017.  completed adjuvant XRT.  Followed by oncology  Anemia Workup with medicine service On heparin,  Will restart eliquis when stable  Cardiomyopathy  Prior anterior MI, Q waves anteriorly on EKG indicating likely remote  Anterior wall on echo appears scarred out, severely hypokinetic, possible regions of akinesis Continue low-dose metoprolol cautious with IV fluids --- No plans for Myoview at this time but this certainly can be done if patient wishes Poor candidate for cardiac catheterization given anemia, unable to reposition secondary to severe pain --- Heparin for now with transition back to Eliquis once anemia stabilized ---If blood pressure stabilizes could add losartan 12.5 mg daily  H/o bilateral PE, LLE DVT - Continueheparin.  PTA Eliquis. Change back after anemia workup   Total encounter time more than 25 minutes  Greater than 50% was spent in counseling and coordination of care with the patient   For questions or updates, please contact [place] Please consult [email or web address] for contact info under        Signed, Ida Rogue, MD  06/01/2018, 11:24 AM

## 2018-06-01 NOTE — Progress Notes (Signed)
Call received just prior to transfer from Dr. Rockey Situ. Verbal order received to restart heparin at prior rate 900 units/hr. Gtt restarted

## 2018-06-01 NOTE — Progress Notes (Signed)
*  PRELIMINARY RESULTS* Echocardiogram Echocardiogram Transesophageal has been performed.  Vickie Barton 06/01/2018, 10:30 AM

## 2018-06-01 NOTE — Progress Notes (Signed)
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 Patient has had chronic pain from progressive metastatic disease. CT on 05/20/2018 reveals large left pelvic sidewall mass, which had increased in size from previous study and likely is exacerbating pain. Further progression of the mass was noted on repeat CT on 05/30/18.  Patient was admitted to the hospital on 05/30/2018 with sepsis from unclear source.  TTE was concerning for possible endocarditis.  Patient also noted to have cardiomyopathy with EF 35% and anterior wall dysfunction with acute elevation of troponin concerning for non-STEMI.  Palliative care was consulted to help address goals.   CODE STATUS: DNR  PAST MEDICAL HISTORY: Past Medical History:  Diagnosis Date   Arthritis    CHF (congestive heart failure) ([place])    Constipation    DVT (deep venous thrombosis) ([place]) 03/2017   left leg   Endometrial cancer ([place]) 01/28/2016   Hysterectomy, chemo + rad tx's, also internal brachytherapy on 02/2018.    Hyperlipidemia    Hypertension    Hypothyroidism     PAST SURGICAL HISTORY:  Past Surgical History:  Procedure Laterality Date   CYSTOSCOPY W/ RETROGRADES Left 01/01/2017   Procedure: CYSTOSCOPY WITH RETROGRADE PYELOGRAM;  Surgeon: Nickie Retort, MD;  Location: [facility];  Service: Urology;  Laterality: Left;   CYSTOSCOPY W/ RETROGRADES Left 04/23/2017   Procedure: CYSTOSCOPY WITH RETROGRADE PYELOGRAM;  Surgeon: Abbie Sons, MD;  Location: [facility];  Service: Urology;  Laterality: Left;   CYSTOSCOPY W/ URETERAL STENT PLACEMENT Left 04/23/2017   Procedure: CYSTOSCOPY WITH STENT REMOVAL;  Surgeon: Abbie Sons, MD;  Location: [facility];  Service: Urology;  Laterality: Left;   CYSTOSCOPY WITH STENT PLACEMENT Left 01/01/2017   Procedure: CYSTOSCOPY WITH STENT PLACEMENT;  Surgeon: Nickie Retort, MD;  Location: [facility];  Service: Urology;  Laterality: Left;   DILATION AND CURETTAGE OF UTERUS     HYSTEROSCOPY W/D&C N/A 01/28/2016   Procedure: DILATATION AND CURETTAGE /HYSTEROSCOPY;  Surgeon: [name], MD;  Location: [facility];  Service: Gynecology;  Laterality: N/A;   LAPAROSCOPIC BILATERAL SALPINGO OOPHERECTOMY Bilateral 02/26/2016   Procedure: LAPAROSCOPIC BILATERAL SALPINGO OOPHORECTOMY;  Surgeon: [name], MD;  Location: [facility];  Service: Gynecology;  Laterality: Bilateral;   LAPAROSCOPIC HYSTERECTOMY N/A 02/26/2016   Procedure: HYSTERECTOMY TOTAL LAPAROSCOPIC;  Surgeon: [name], MD;  Location: [facility];  Service: Gynecology;  Laterality: N/A;   SENTINEL NODE BIOPSY N/A 02/26/2016   Procedure: SENTINEL NODE BIOPSY;  Surgeon: [name], MD;  Location: [facility];  Service: Gynecology;  Laterality: N/A;   TONSILLECTOMY      HEMATOLOGY/ONCOLOGY HISTORY:   No history exists.    ALLERGIES:  is allergic to sulfa antibiotics and zinc.  MEDICATIONS:  Current Facility-Administered Medications  Medication Dose Route Frequency Provider Last Rate Last Dose   acetaminophen (TYLENOL) tablet 650 mg  650 mg Oral  Q6H PRN Saundra Shelling, MD       Or   acetaminophen (TYLENOL) suppository 650 mg  650 mg Rectal Q6H PRN Saundra Shelling,  MD       aspirin EC tablet 81 mg  81 mg Oral Daily Rockey Situ, Kathlene November, MD   81 mg at 06/01/18 1240   atorvastatin (LIPITOR) tablet 10 mg  10 mg Oral q1800 [name], MD   10 mg at 06/01/18 1709   calcium-vitamin D (OSCAL WITH D) 500-200 MG-UNIT per tablet 1 tablet  1 tablet Oral Daily Pyreddy, Reatha Harps, MD   1 tablet at 06/01/18 1239   ceFEPIme (MAXIPIME) 2 g in sodium chloride 0.9 % 100 mL IVPB  2 g Intravenous Q24H Pyreddy, Reatha Harps, MD 200 mL/hr at 06/01/18 1353 2 g at 06/01/18 1353   fentaNYL (SUBLIMAZE) 100 MCG/2ML injection            heparin ADULT infusion 100 units/mL (25000 units/2101mL sodium chloride 0.45%)  900 Units/hr Intravenous Continuous [name], MD 9 mL/hr at 06/01/18 1233 900 Units/hr at 06/01/18 1233   levothyroxine (SYNTHROID, LEVOTHROID) tablet 150 mcg  150 mcg Oral Q0600 Darel Hong D, NP   150 mcg at 06/01/18 3536   metoprolol tartrate (LOPRESSOR) tablet 12.5 mg  12.5 mg Oral BID [name], MD   12.5 mg at 06/01/18 1238   midazolam (VERSED) 5 MG/5ML injection            morphine 2 MG/ML injection 2 mg  2 mg Intravenous Q2H PRN Flora Lipps, MD       ondansetron (ZOFRAN) injection 4 mg  4 mg Intravenous Q6H PRN Pyreddy, Reatha Harps, MD       oxyCODONE (Oxy IR/ROXICODONE) immediate release tablet 10 mg  10 mg Oral Q3H PRN Saundra Shelling, MD   10 mg at 05/31/18 1955   oxyCODONE (OXYCONTIN) 12 hr tablet 15 mg  15 mg Oral Q12H Madysun Thall, [name], NP   15 mg at 06/01/18 1239   vancomycin (VANCOCIN) IVPB 750 mg/150 ml premix  750 mg Intravenous Q24H Saundra Shelling, MD 150 mL/hr at 05/31/18 1033 750 mg at 05/31/18 1033    VITAL SIGNS: BP (!) 104/58    Pulse 72    Temp 98.2 F (36.8 C) (Oral)    Resp 20    Ht 5\' 2"  (1.575 m)    Wt 135 lb (61.2 kg)    SpO2 94%    BMI 24.69 kg/m  Filed Weights   05/31/18 1737 06/01/18 0304 06/01/18 0918  Weight: 135 lb 9.6 oz (61.5 kg) 140 lb 3.2 oz (63.6 kg) 135 lb (61.2 kg)    Estimated body mass index is 24.69  kg/m as calculated from the following:   Height as of this encounter: 5\' 2"  (1.575 m).   Weight as of this encounter: 135 lb (61.2 kg).  LABS: CBC:    Component Value Date/Time   WBC 10.7 (H) 06/01/2018 0259   HGB 7.6 (L) 06/01/2018 0259   HCT 24.7 (L) 06/01/2018 0259   PLT 380 06/01/2018 0259   MCV 88.5 06/01/2018 0259   NEUTROABS 9.6 (H) 05/26/2018 1544   LYMPHSABS 0.8 05/26/2018 1544   MONOABS 0.7 05/26/2018 1544   EOSABS 0.0 05/26/2018 1544   BASOSABS 0.0 05/26/2018 1544   Comprehensive Metabolic Panel:    Component Value Date/Time   NA 133 (L) 06/01/2018 0259   K 4.1 06/01/2018 0259   CL 108 06/01/2018 0259   CO2 19 (L) 06/01/2018 0259  BUN 14 06/01/2018 0259   CREATININE 0.73 06/01/2018 0259   GLUCOSE 101 (H) 06/01/2018 0259   CALCIUM 7.8 (L) 06/01/2018 0259   AST 31 06/01/2018 0259   ALT 18 06/01/2018 0259   ALKPHOS 58 06/01/2018 0259   BILITOT 0.5 06/01/2018 0259   PROT 5.3 (L) 06/01/2018 0259   ALBUMIN 1.8 (L) 06/01/2018 0259    RADIOGRAPHIC STUDIES: Ct Abdomen Pelvis W Contrast  Result Date: 05/30/2018 CLINICAL DATA:  Acute generalized abdominal pain. EXAM: CT ABDOMEN AND PELVIS WITH CONTRAST TECHNIQUE: Multidetector CT imaging of the abdomen and pelvis was performed using the standard protocol following bolus administration of intravenous contrast. CONTRAST:  62mL OMNIPAQUE IOHEXOL 300 MG/ML  SOLN COMPARISON:  CT scan of May 20, 2018. FINDINGS: Lower chest: No acute abnormality. Hepatobiliary: No focal liver abnormality is seen. No gallstones, gallbladder wall thickening, or biliary dilatation. Pancreas: Unremarkable. No pancreatic ductal dilatation or surrounding inflammatory changes. Spleen: Stable lucency seen in the spleen most consistent with hemangioma or cyst. No other splenic abnormality seen. Adrenals/Urinary Tract: Adrenal glands appear normal. Stable right renal cysts are noted. Minimal left hydronephrosis is noted but there is no evidence of  obstructing calculus. No definite evidence of ureteral compression is noted. Urinary bladder is unremarkable. Stomach/Bowel: The stomach appears normal. There is no evidence of bowel obstruction or inflammation. The appendix appears unremarkable. Vascular/Lymphatic: Atherosclerosis of abdominal aorta is noted without aneurysm formation. Reproductive: Status post hysterectomy and bilateral oophorectomy. 6.9 x 4.6 cm mass is noted in left side of pelvis and left iliac fossa consistent with ovarian malignancy or metastatic adenopathy. This is slightly enlarged compared to prior exam. Other: No abdominal wall hernia or abnormality. No abdominopelvic ascites. Musculoskeletal: No acute or significant osseous findings. IMPRESSION: 6.9 x 4.6 cm mass is noted in left pelvic sidewall and left iliac fossa consistent with ovarian malignancy or metastatic adenopathy. This is slightly enlarged compared to prior exam. Minimal left hydronephrosis is noted which is slightly improved compared to prior exam. No ureteral calculus or definite evidence of external compression is noted. Aortic Atherosclerosis (ICD10-I70.0). Electronically Signed   By: Marijo Conception, M.D.   On: 05/30/2018 14:46   Ct Abdomen Pelvis W Contrast  Result Date: 05/20/2018 CLINICAL DATA:  Pt c/o severe Left groin pain radiating down her Left leg x 2 weeks. NKI Hx Endometrial CA diagnosed in 01/2017 and she had total hysterectomy, chemo + rad tx's. Also had internal brachytherapy on 02/2018. EXAM: CT ABDOMEN AND PELVIS WITH CONTRAST TECHNIQUE: Multidetector CT imaging of the abdomen and pelvis was performed using the standard protocol following bolus administration of intravenous contrast. CONTRAST:  44mL OMNIPAQUE IOHEXOL 300 MG/ML  SOLN COMPARISON:  Current abdomen radiographs. PET-CT, 02/04/2018. Abdomen and pelvis CT, 01/10/2018. FINDINGS: Lower chest: No acute abnormality. Hepatobiliary: Normal liver. Gallbladder is distended but otherwise unremarkable.  No bile duct dilation. Lucency noted along the right margin of the liver on the current abdomen radiographs is likely due to the right colon adjacent to the liver, accentuated by patient rotation. Pancreas: Unremarkable. No pancreatic ductal dilatation or surrounding inflammatory changes. Spleen: 9 mm low-density lesion in the medial spleen, stable from the prior exam, likely a cyst or hemangioma. Spleen normal in size. No other masses or lesions. Adrenals/Urinary Tract: No adrenal masses. No adrenal masses. Low-density right renal masses consistent with cysts, stable. No left renal masses. Mild left hydronephrosis with dilation of the proximal and mid left ureter. No ureteral stone. Ureter comes in contact with a left pelvic  sidewall mass. No right hydronephrosis. Normal right ureter. Bladder is unremarkable. Stomach/Bowel: Stomach is unremarkable. Small bowel and colon are normal in caliber. There are colonic diverticula without diverticulitis. No small bowel or colon wall thickening or inflammation. Normal appendix visualized. Vascular/Lymphatic: Aortic atherosclerosis extending to its branch vessels. No aneurysm. Stable inferior vena cava filter Left pelvic sidewall masses with associated calcifications, which may reflect locally invasive endometrial carcinoma or metastatic adenopathy. The precaval and right common iliac artery lymphadenopathy noted on the prior study has significantly improved with no residual enlarged lymph nodes. No other evidence of lymphadenopathy. Reproductive: Status post hysterectomy. Left pelvic sidewall mass, which is contiguous with the left iliacus muscle. Mass measures 7.3 x 3.9 x 5.8 cm, measuring 6.1 x 3.6 x 4.64 cm on the prior CT. There are associated calcifications mostly along its inferior margin. No underlying bone resorption. Other: No abdominal wall hernia.  No ascites. Musculoskeletal: No fracture or acute finding. No osteoblastic or osteolytic lesions. IMPRESSION: 1. The  lucency along the right margin of the liver noted on the current abdomen radiographs was artifactual, most likely due to the right colon along the anterolateral liver margin, accentuated by patient rotation. 2. Mild left hydronephrosis and hydroureter, new since the prior CT. This appears due to an extrinsic effect by left pelvic sidewall mass, which contacts the distal left ureter. No ureteral stone. 3. Left pelvic sidewall mass, consistent with locally invasive endometrial carcinoma or metastatic adenopathy or a combination, has increased in size from the prior CT. However, the precaval and right common iliac chain adenopathy noted on the prior study has significantly improved. 4. No other evidence of metastatic disease. 5. Other chronic findings, including aortic atherosclerosis, are stable from prior CT. Electronically Signed   By: Lajean Manes M.D.   On: 05/20/2018 16:18   Dg Bone Density  Result Date: 05/12/2018 EXAM: DUAL X-RAY ABSORPTIOMETRY (DXA) FOR BONE MINERAL DENSITY IMPRESSION: Technologist: SCE PATIENT BIOGRAPHICAL: Name: Shamari, Trostel Patient ID: [id number] Birth Date: 06-Jun-1930 Height: 60.0 in. Gender: Female Exam Date: 05/12/2018 Weight: 132.5 lbs. Indications: Advanced Age, Caucasian, Height Loss, History of Chemo, History of Endometrial Cancer, History of Radiation, Hypothyroid, Hysterectomy, Oophorectomy Bilateral, Postmenopausal Fractures: Treatments: Levothyroxine ASSESSMENT: The BMD measured at Forearm Radius 33% is 0.534 g/cm2 with a T-score of -3.9. This patient is considered osteoporotic according to [facility]) criteria. The quality of the scan was limited. Lumbar spine was not utilized due to surgrical hardware visible. Site Region Measured Measured WHO Young Adult BMD Date       Age      Classification T-score DualFemur Total Right 05/12/2018 85.8 Osteoporosis -3.0 0.628 g/cm2 Left Forearm Radius 33% 05/12/2018 85.8 Osteoporosis -3.9 0.534 g/cm2 World  Health Organization [facility]) criteria for post-menopausal, Caucasian Women: Normal:       T-score at or above -1 SD Osteopenia:   T-score between -1 and -2.5 SD Osteoporosis: T-score at or below -2.5 SD RECOMMENDATIONS: 1. All patients should optimize calcium and vitamin D intake. 2. Consider FDA-approved medical therapies in postmenopausal women and men aged 68 years and older, based on the following: a. A hip or vertebral(clinical or morphometric) fracture b. T-score < -2.5 at the femoral neck or spine after appropriate evaluation to exclude secondary causes c. Low bone mass (T-score between -1.0 and -2.5 at the femoral neck or spine) and a 10-year probability of a hip fracture > 3% or a 10-year probability of a major osteoporosis-related fracture > 20% based on the [place]-adapted [facility]  algorithm d. Clinician judgment and/or patient preferences may indicate treatment for people with 10-year fracture probabilities above or below these levels FOLLOW-UP: People with diagnosed cases of osteoporosis or at high risk for fracture should have regular bone mineral density tests. For patients eligible for Medicare, routine testing is allowed once every 2 years. The testing frequency can be increased to one year for patients who have rapidly progressing disease, those who are receiving or discontinuing medical therapy to restore bone mass, or have additional risk factors. I have reviewed this report, and agree with the above findings. [facility] Radiology Electronically Signed   By: Lowella Grip III M.D.   On: 05/12/2018 09:27   Dg Chest Port 1 View  Result Date: 05/30/2018 CLINICAL DATA:  Sepsis.  Ovarian cancer. EXAM: PORTABLE CHEST 1 VIEW COMPARISON:  11/04/2016 FINDINGS: The heart size and mediastinal contours are within normal limits. Both lungs are clear. The visualized skeletal structures are unremarkable. IMPRESSION: No active disease. Electronically Signed   By: Lorriane Shire M.D.   On: 05/30/2018 13:32   Dg Abd 2  Views  Result Date: 05/20/2018 CLINICAL DATA:  LLQ pain getting worse since 1 week ago/ the pain in setting of recurrent endometerial cancerHysterectomy 2 years ago Constipation X days EXAM: ABDOMEN - 2 VIEW COMPARISON:  CT, 01/10/2018 FINDINGS: Normal bowel gas pattern.  No evidence of obstruction. On the initial supine image, there is relative lucency along the right lateral margin of the liver shadow with an apparent irregular contour of the right lateral liver margin. This is of unclear etiology. Stable well-positioned inferior vena cava filter. No evidence of renal or ureteral stones. Clear lung bases.  No acute skeletal abnormality. IMPRESSION: 1. No acute findings.  No evidence of bowel obstruction. 2. Abnormal appearing lucency along the right lateral margin of the liver of unclear etiology. This could potentially be artifact. This could be further assessed with CT. Electronically Signed   By: Lajean Manes M.D.   On: 05/20/2018 12:48    PERFORMANCE STATUS (ECOG) : 4 - Bedbound  REVIEW OF SYSTEMS: As noted above. Otherwise, a complete review of systems is negative.  PHYSICAL EXAM: General: NAD, frail appearing, thin Cardiovascular: regular rate and rhythm Pulmonary: clear ant fields Abdomen: soft, nontender, + bowel sounds GU: no suprapubic tenderness Extremities: edema LLE Skin: no rashes Neurological: Weakness but otherwise nonfocal  IMPRESSION: Patient had TEE today without note of vegetations. CM again noted.   Patient says pain is much improved today on current regimen. Will continue OxyContin and oxy IR for BTP. Will likely need rehab. Would benefit from [facility] following at rehab.   PLAN: Continue current scope of treatment Continue OxyContin and oxycodone IR PT eval  Anticipate rehab with palliative care following   Time Total: 15 minutes  Visit consisted of counseling and education dealing with the complex and emotionally intense issues of symptom management and  palliative care in the setting of serious and potentially life-threatening illness.Greater than 50%  of this time was spent counseling and coordinating care related to the above assessment and plan.  Signed by: Altha Harm, PhD, NP-C [phone] (Work Cell)

## 2018-06-01 NOTE — Progress Notes (Signed)
Heparin Gtt stopped at 0950 per Dr. Rockey Situ for TEE procedure. TEE completed and per Verbal order Dr. Rockey Situ d/c heparin gtt at this time. Dr. Rockey Situ to consult with Primary today.

## 2018-06-01 NOTE — Procedures (Signed)
Patient Name:   Vickie Barton Date of Exam: 06/01/2018 Medical Rec #:  [id number]         Height:       62.0 in Accession #:    [id number]        Weight:       140.2 lb Date of Birth:  [birth year]         BSA:          1.64 m Patient Age:    85 years          BP:           119/67 mmHg Patient Gender: F                 HR:           65 bpm. Exam Location:  [facility]    Procedure: Transesophageal Echo, Color Doppler and Saline Contrast Bubble Study  Indications:     I33.0 SBE   History:         Patient has prior history of Echocardiogram examinations, most                  recent 05/31/2018. CHF; Risk Factors: Hypertension and                  Dyslipidemia. Endometrial cancer 2017.   Sonographer:     Charmayne Sheer RDCS (AE) Referring Phys:  [place] Diagnosing Phys: Ida Rogue MD     PROCEDURE: No evidence of vegetation. Local oropharyngeal anesthetic was provided with Benzocaine spray and viscous lidocaine. Image quality was good. The patient developed no complications during the procedure.  Moderate sedation given Fentanyl 125 mcg IV push given  Versed 5 mg IV push given I was present throughout the moderate sedation procedure Starting at 9:50 AM completed at 10:30 AM  IMPRESSIONS   1. The left ventricle has moderate-severely reduced systolic function, with an ejection fraction of 30-35%.  2. Severe hypokinesis of the left ventricular, entire septal wall, anteroseptal wall, anterior wall and apical segment.  3. The right ventricle has normal systolc function. The cavity was normal. There is no increase in right ventricular wall thickness. Right ventricular systolic pressure could not be assessed.  4. Aortic valve regurgitation is trivial by color flow Doppler.  5. No valve vegetation noted.  FINDINGS  Left Ventricle: The left ventricle has moderate-severely reduced systolic function, with an ejection fraction of 30-35%. Severe hypokinesis of the left  ventricular, entire septal wall, anteroseptal wall, anterior wall and apical segment. Right Ventricle: The right ventricle has normal systolic function. The cavity was normal. There is no increase in right ventricular wall thickness. Right ventricular systolic pressure could not be assessed. Left Atrium: Left atrial size was normal in size. Right Atrium: Right atrial size was normal in size. Right atrial pressure is estimated at 10 mmHg. Interatrial Septum: Agitated saline contrast was given intravenously to evaluate for intracardiac shunting. Saline contrast bubble study was negative, with no evidence of any interatrial shunt. Pericardium: There is no evidence of pericardial effusion. Mitral Valve: The mitral valve is normal in structure. Mitral valve regurgitation is trivial by color flow Doppler. Tricuspid Valve: The tricuspid valve was normal in structure. Tricuspid valve regurgitation is trivial by color flow Doppler. Aortic Valve: The aortic valve is normal in structure. Aortic valve regurgitation is trivial by color flow Doppler. Pulmonic Valve: The pulmonic valve was grossly normal. Aorta: The aorta was not well visualized.  RIGHT ATRIUM RA Pressure: 10 mmHg    Ida Rogue MD Electronically signed by Ida Rogue MD Signature Date/Time: 06/01/2018/11:41:41 AM

## 2018-06-02 ENCOUNTER — Encounter: Payer: Self-pay | Admitting: Cardiovascular Disease

## 2018-06-02 DIAGNOSIS — R1032 Left lower quadrant pain: Secondary | ICD-10-CM

## 2018-06-02 DIAGNOSIS — N134 Hydroureter: Secondary | ICD-10-CM

## 2018-06-02 DIAGNOSIS — Z87891 Personal history of nicotine dependence: Secondary | ICD-10-CM

## 2018-06-02 DIAGNOSIS — Q211 Atrial septal defect: Secondary | ICD-10-CM

## 2018-06-02 DIAGNOSIS — Z8673 Personal history of transient ischemic attack (TIA), and cerebral infarction without residual deficits: Secondary | ICD-10-CM

## 2018-06-02 DIAGNOSIS — Z8544 Personal history of malignant neoplasm of other female genital organs: Secondary | ICD-10-CM

## 2018-06-02 DIAGNOSIS — N1330 Unspecified hydronephrosis: Secondary | ICD-10-CM

## 2018-06-02 DIAGNOSIS — Z923 Personal history of irradiation: Secondary | ICD-10-CM

## 2018-06-02 DIAGNOSIS — Z888 Allergy status to other drugs, medicaments and biological substances status: Secondary | ICD-10-CM

## 2018-06-02 DIAGNOSIS — Z881 Allergy status to other antibiotic agents status: Secondary | ICD-10-CM

## 2018-06-02 DIAGNOSIS — R509 Fever, unspecified: Secondary | ICD-10-CM

## 2018-06-02 DIAGNOSIS — Z90722 Acquired absence of ovaries, bilateral: Secondary | ICD-10-CM

## 2018-06-02 DIAGNOSIS — Z8619 Personal history of other infectious and parasitic diseases: Secondary | ICD-10-CM

## 2018-06-02 DIAGNOSIS — R112 Nausea with vomiting, unspecified: Secondary | ICD-10-CM

## 2018-06-02 DIAGNOSIS — Z960 Presence of urogenital implants: Secondary | ICD-10-CM

## 2018-06-02 DIAGNOSIS — I509 Heart failure, unspecified: Secondary | ICD-10-CM

## 2018-06-02 DIAGNOSIS — Z9221 Personal history of antineoplastic chemotherapy: Secondary | ICD-10-CM

## 2018-06-02 DIAGNOSIS — I110 Hypertensive heart disease with heart failure: Secondary | ICD-10-CM

## 2018-06-02 DIAGNOSIS — E039 Hypothyroidism, unspecified: Secondary | ICD-10-CM

## 2018-06-02 DIAGNOSIS — Z90710 Acquired absence of both cervix and uterus: Secondary | ICD-10-CM

## 2018-06-02 DIAGNOSIS — G8929 Other chronic pain: Secondary | ICD-10-CM

## 2018-06-02 DIAGNOSIS — R197 Diarrhea, unspecified: Secondary | ICD-10-CM

## 2018-06-02 LAB — CBC
HCT: 25.2 % — ABNORMAL LOW (ref 36.0–46.0)
Hemoglobin: 7.8 g/dL — ABNORMAL LOW (ref 12.0–15.0)
MCH: 27 pg (ref 26.0–34.0)
MCHC: 31 g/dL (ref 30.0–36.0)
MCV: 87.2 fL (ref 80.0–100.0)
Platelets: 367 10*3/uL (ref 150–400)
RBC: 2.89 MIL/uL — ABNORMAL LOW (ref 3.87–5.11)
RDW: 14 % (ref 11.5–15.5)
WBC: 8.8 10*3/uL (ref 4.0–10.5)
nRBC: 0 % (ref 0.0–0.2)

## 2018-06-02 LAB — IRON AND TIBC
Iron: 25 ug/dL — ABNORMAL LOW (ref 28–170)
Saturation Ratios: 21 % (ref 10.4–31.8)
TIBC: 119 ug/dL — ABNORMAL LOW (ref 250–450)
UIBC: 94 ug/dL

## 2018-06-02 LAB — BASIC METABOLIC PANEL
Anion gap: 7 (ref 5–15)
BUN: 14 mg/dL (ref 8–23)
CO2: 20 mmol/L — ABNORMAL LOW (ref 22–32)
CREATININE: 0.66 mg/dL (ref 0.44–1.00)
Calcium: 7.8 mg/dL — ABNORMAL LOW (ref 8.9–10.3)
Chloride: 108 mmol/L (ref 98–111)
GFR calc Af Amer: >60 mL/min (ref >60)
GFR calc non Af Amer: >60 mL/min (ref >60)
Glucose, Bld: 95 mg/dL (ref 70–99)
Potassium: 3.9 mmol/L (ref 3.5–5.1)
Sodium: 135 mmol/L (ref 135–145)

## 2018-06-02 LAB — MAGNESIUM: Magnesium: 1.9 mg/dL (ref 1.7–2.4)

## 2018-06-02 LAB — FERRITIN: Ferritin: 482 ng/mL — ABNORMAL HIGH (ref 11–307)

## 2018-06-02 LAB — PROCALCITONIN: PROCALCITONIN: 0.1 ng/mL

## 2018-06-02 LAB — APTT
aPTT: 76 seconds — ABNORMAL HIGH (ref 24–36)
aPTT: 80 seconds — ABNORMAL HIGH (ref 24–36)

## 2018-06-02 LAB — PHOSPHORUS: Phosphorus: 2.4 mg/dL — ABNORMAL LOW (ref 2.5–4.6)

## 2018-06-02 LAB — HEPARIN LEVEL (UNFRACTIONATED): Heparin Unfractionated: 1.57 IU/mL — ABNORMAL HIGH (ref 0.30–0.70)

## 2018-06-02 MED ORDER — ALPRAZOLAM 0.25 MG PO TABS
0.2500 mg | ORAL_TABLET | Freq: Two times a day (BID) | ORAL | Status: DC | PRN
  Administered 2018-06-03: 0.25 mg via ORAL
  Filled 2018-06-02: qty 1

## 2018-06-02 MED ORDER — SODIUM CHLORIDE 0.9 % IV SOLN
INTRAVENOUS | Status: DC | PRN
  Administered 2018-06-02: 500 mL via INTRAVENOUS
  Administered 2018-06-03 – 2018-06-05 (×5): 250 mL via INTRAVENOUS
  Administered 2018-06-06: 500 mL via INTRAVENOUS

## 2018-06-02 MED ORDER — SODIUM CHLORIDE 0.9% FLUSH: 3.0000 mL | INTRAVENOUS | Status: DC | PRN

## 2018-06-02 MED ORDER — METRONIDAZOLE IN NACL 5-0.79 MG/ML-% IV SOLN
500.0000 mg | Freq: Three times a day (TID) | INTRAVENOUS | Status: DC
  Administered 2018-06-03 (×2): 500 mg via INTRAVENOUS
  Filled 2018-06-02 (×4): qty 100

## 2018-06-02 MED ORDER — DOCUSATE SODIUM 100 MG PO CAPS
100.0000 mg | ORAL_CAPSULE | Freq: Two times a day (BID) | ORAL | Status: DC
  Administered 2018-06-02 – 2018-06-06 (×9): 100 mg via ORAL
  Filled 2018-06-02 (×10): qty 1

## 2018-06-02 MED ORDER — SODIUM CHLORIDE 0.9% FLUSH
3.0000 mL | Freq: Two times a day (BID) | INTRAVENOUS | Status: DC
  Administered 2018-06-02 – 2018-06-06 (×9): 3 mL via INTRAVENOUS

## 2018-06-02 NOTE — Progress Notes (Signed)
10 mg oxycodone given po prior to PT evaluation and treatment.

## 2018-06-02 NOTE — Consult Note (Addendum)
NAME: Vickie Barton  DOB: 11-08-1930  MRN: [id number]  Date/Time: 06/02/2018 7:50 PM  REQUESTING PROVIDER:Ojie Subjective:  REASON FOR CONSULT: fever ? Vickie Barton is a 85 y.o. with a history of metastatic endometrial carcinoma, CHF, HTN, Hypothyroidism, H/O PE, Left DVT in 2018 Presented with  with fever, nausea vomiting diarrhea and left lower quadrant abdominal pain.. The diarrhea had been intermittent and present even before the antibiotic as per oncology note.  Vitals on the day of presentation revealed Blood pressure (!) 100/57, pulse 87, temperature (!) 103.3 F (39.6 C),  She was started on broad spectrum antibiotic vanco/cefepime  after cultures were sent. Cultures are neg so far. I am asked to see the patient as she continues to have fever. 2 d echo was done on 3/10 and Tricuspid valve with concern for mobile 1.7 x 1.4 cm opacity. 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  PMH also notable for history of PE/DVT. In Aug 2018 she was in [place] with a massive PE and LEFT DVT. She also had Peptoniphilus bacteremia. She was also found at that time to have Takotsubo's cardiomyopathy and new acute-subacute embolic strokes (also found PFO on this admission and strokes thought to be embolic from DVT and crossing PFO). Treated with heparin and then eliquis/ IVC filter   left hydronephrosis and hydroureter status post ureteral stent placement.  Patient has had chronic pain from progressive metastatic disease.  CT on 05/20/2018 reveals large left pelvic sidewall mass,  Pt says she is unable to walk , has  severe pain left hip and lower abdominal area Left leg is swollen more than before. She has been refusing to see GYN as OP for pelvic examination because of pain  Past Medical History:  Diagnosis Date  . Arthritis   . CHF (congestive heart failure) ([place])   . Constipation   . DVT (deep venous thrombosis) ([place]) 03/2017   left leg  . Endometrial cancer ([place]) 01/28/2016   Hysterectomy, chemo + rad tx's, also internal brachytherapy on 02/2018.   [name] Hyperlipidemia   . Hypertension   . Hypothyroidism     Past Surgical History:  Procedure Laterality Date  . CYSTOSCOPY W/ RETROGRADES Left 01/01/2017   Procedure: CYSTOSCOPY WITH RETROGRADE PYELOGRAM;  Surgeon: Nickie Retort, MD;  Location: [facility];  Service: Urology;  Laterality: Left;  . CYSTOSCOPY W/ RETROGRADES Left 04/23/2017   Procedure: CYSTOSCOPY WITH RETROGRADE PYELOGRAM;  Surgeon: Abbie Sons, MD;  Location: [facility];  Service: Urology;  Laterality: Left;  . CYSTOSCOPY W/ URETERAL STENT PLACEMENT Left 04/23/2017   Procedure: CYSTOSCOPY WITH STENT REMOVAL;  Surgeon: Abbie Sons, MD;  Location: [facility];  Service: Urology;  Laterality: Left;  . CYSTOSCOPY WITH STENT PLACEMENT Left 01/01/2017   Procedure: CYSTOSCOPY WITH STENT PLACEMENT;  Surgeon: Nickie Retort, MD;  Location: [facility];  Service: Urology;  Laterality: Left;  . DILATION AND CURETTAGE OF UTERUS    . HYSTEROSCOPY W/D&C N/A 01/28/2016   Procedure: DILATATION AND CURETTAGE /HYSTEROSCOPY;  Surgeon: [name], MD;  Location: [facility];  Service: Gynecology;  Laterality: N/A;  . LAPAROSCOPIC BILATERAL SALPINGO OOPHERECTOMY  Bilateral 02/26/2016   Procedure: LAPAROSCOPIC BILATERAL SALPINGO OOPHORECTOMY;  Surgeon: [name], MD;  Location: [facility];  Service: Gynecology;  Laterality: Bilateral;  . LAPAROSCOPIC HYSTERECTOMY N/A 02/26/2016   Procedure: HYSTERECTOMY TOTAL LAPAROSCOPIC;  Surgeon: [name], MD;  Location: [facility];  Service: Gynecology;   Laterality: N/A;  . SENTINEL NODE BIOPSY N/A 02/26/2016   Procedure: SENTINEL NODE BIOPSY;  Surgeon: [name], MD;  Location: [facility];  Service: Gynecology;  Laterality: N/A;  . TEE WITHOUT CARDIOVERSION N/A 06/01/2018   Procedure: TRANSESOPHAGEAL ECHOCARDIOGRAM (TEE);  Surgeon: [name], MD;  Location: [facility];  Service: Cardiovascular;  Laterality: N/A;  . TONSILLECTOMY      Social History   Socioeconomic History  . Marital status: Widowed    Spouse name: Not on file  . Number of children: Not on file  . Years of education: Not on file  . Highest education level: Not on file  Occupational History  . Occupation: retired  Scientific laboratory technician  . Financial resource strain: Not on file  . Food insecurity:    Worry: Not on file    Inability: Not on file  . Transportation needs:    Medical: Not on file    Non-medical: Not on file  Tobacco Use  . Smoking status: Former Research scientist (life sciences)  . Smokeless tobacco: Never Used  Substance and Sexual Activity  . Alcohol use: No  . Drug use: No  . Sexual activity: Not on file  Lifestyle  . Physical activity:    Days per week: Not on file    Minutes per session: Not on file  . Stress: Not on file  Relationships  . Social connections:    Talks on phone: Not on file    Gets together: Not on file    Attends religious service: Not on file    Active member of club or organization: Not on file    Attends meetings of clubs or organizations: Not on file    Relationship status: Not on file  . Intimate partner violence:    Fear of current or ex partner: Not on file    Emotionally abused: Not on file    Physically abused: Not on file    Forced sexual activity: Not on file  Other Topics Concern  . Not on file  Social History Narrative  . Not on file    Family History  Problem Relation Age of Onset  . Diabetes Father   . Heart disease Father   . Hypertension Father   . Stroke Father   . Diabetes Paternal Grandmother   . Heart disease  Mother   . Hypertension Mother   . Stroke Mother   . Hypertension Sister   . Thyroid disease Sister   . Hypertension Brother    Allergies  Allergen Reactions  . Sulfa Antibiotics Rash  . Zinc Rash  ? Current Facility-Administered Medications  Medication Dose Route Frequency Provider Last Rate Last Dose  . 0.9 %  sodium chloride infusion   Intravenous PRN Stark Jock, Jude, MD   Stopped at 06/02/18 1400  . acetaminophen (TYLENOL) tablet 650 mg  650 mg Oral Q6H PRN Saundra Shelling, MD   650 mg at 06/02/18 0550   Or  . acetaminophen (TYLENOL) suppository 650 mg  650 mg Rectal Q6H PRN Pyreddy, Reatha Harps, MD      . ALPRAZolam Duanne Moron) tablet 0.25 mg  0.25 mg Oral BID PRN Stark Jock, Jude, MD      . aspirin EC  tablet 81 mg  81 mg Oral Daily [name], MD   81 mg at 06/02/18 1054  . atorvastatin (LIPITOR) tablet 10 mg  10 mg Oral q1800 [name], MD   10 mg at 06/02/18 1712  . calcium-vitamin D (OSCAL WITH D) 500-200 MG-UNIT per tablet 1 tablet  1 tablet Oral Daily Pyreddy, Reatha Harps, MD   1 tablet at 06/01/18 1239  . ceFEPIme (MAXIPIME) 2 g in sodium chloride 0.9 % 100 mL IVPB  2 g Intravenous Q24H Saundra Shelling, MD   Stopped at 06/02/18 1150  . docusate sodium (COLACE) capsule 100 mg  100 mg Oral BID Ojie, Jude, MD      . heparin ADULT infusion 100 units/mL (25000 units/266mL sodium chloride 0.45%)  1,000 Units/hr Intravenous Continuous Ojie, Jude, MD 10 mL/hr at 06/02/18 1711 1,000 Units/hr at 06/02/18 1711  . levothyroxine (SYNTHROID, LEVOTHROID) tablet 150 mcg  150 mcg Oral Q0600 Darel Hong D, NP   150 mcg at 06/02/18 7829  . metoprolol tartrate (LOPRESSOR) tablet 12.5 mg  12.5 mg Oral BID [name], MD   12.5 mg at 06/01/18 1238  . morphine 2 MG/ML injection 2 mg  2 mg Intravenous Q2H PRN Flora Lipps, MD      . ondansetron (ZOFRAN) injection 4 mg  4 mg Intravenous Q6H PRN Pyreddy, Pavan, MD      . oxyCODONE (Oxy IR/ROXICODONE) immediate release tablet 10 mg  10 mg Oral Q3H PRN  Saundra Shelling, MD   10 mg at 06/02/18 1507  . oxyCODONE (OXYCONTIN) 12 hr tablet 15 mg  15 mg Oral Q12H Borders, [name], NP   15 mg at 06/02/18 1054  . sodium chloride flush (NS) 0.9 % injection 3 mL  3 mL Intravenous Q12H Ojie, Jude, MD      . sodium chloride flush (NS) 0.9 % injection 3 mL  3 mL Intravenous PRN Ojie, Jude, MD      . vancomycin (VANCOCIN) IVPB 750 mg/150 ml premix  750 mg Intravenous Q24H Pyreddy, Reatha Harps, MD   Stopped at 06/02/18 1310     Abtx:  Anti-infectives (From admission, onward)   Start     Dose/Rate Route Frequency Ordered Stop   05/31/18 1000  vancomycin (VANCOCIN) IVPB 750 mg/150 ml premix     750 mg 150 mL/hr over 60 Minutes Intravenous Every 24 hours 05/30/18 1537     05/31/18 1000  ceFEPIme (MAXIPIME) 2 g in sodium chloride 0.9 % 100 mL IVPB     2 g 200 mL/hr over 30 Minutes Intravenous Every 24 hours 05/30/18 1537     05/30/18 1230  ceFEPIme (MAXIPIME) 2 g in sodium chloride 0.9 % 100 mL IVPB     2 g 200 mL/hr over 30 Minutes Intravenous  Once 05/30/18 1223 05/30/18 1325   05/30/18 1230  metroNIDAZOLE (FLAGYL) IVPB 500 mg     500 mg 100 mL/hr over 60 Minutes Intravenous  Once 05/30/18 1223 05/30/18 1437   05/30/18 1230  vancomycin (VANCOCIN) IVPB 1000 mg/200 mL premix     1,000 mg 200 mL/hr over 60 Minutes Intravenous  Once 05/30/18 1223 05/30/18 1518      REVIEW OF SYSTEMS:  Const:  fever,  chills,  weight loss Eyes: negative diplopia or visual changes, negative eye pain ENT: negative coryza, negative sore throat Resp: negative cough, hemoptysis, dyspnea Cards: negative for chest pain, palpitations, has severe leftlower extremity edema GU:  frequency, dysuria  FA:[id number] pain, diarrhea, bleeding,  No stool  thru vagina Skin: negative for rash and pruritus Heme: negative for easy bruising and gum/nose bleeding MS: myalgias, , back pain and muscle weakness Neurolo:negative for headaches, dizziness, vertigo, memory problems  Psych: negative  for feelings of anxiety, depression  Endocrine: no polyuria  Allergy/Immunology- sulfa antibiotic ?  Objective:  VITALS:  BP (!) 101/55 (BP Location: Left Arm)   Pulse 80   Temp 99.4 F (37.4 C) (Oral)   Resp 20   Ht 5\' 2"  (1.575 m)   Wt 61.2 kg   SpO2 99%   BMI 24.69 kg/m  PHYSICAL EXAM:  General: Alert, cooperative, no distress unless she moves and she is in severe pain, appears young for age- oriented X 5 Head: Normocephalic, without obvious abnormality, atraumatic. Eyes: Conjunctivae clear, anicteric sclerae. Pupils are equal ENT Nares normal. No drainage or sinus tenderness. Lips, mucosa, and tongue normal. No Thrush Neck: Supple, symmetrical, no adenopathy, thyroid: non tender no carotid bruit and no JVD. Back: No CVA tenderness. Lungs: Clear to auscultation bilaterally. No Wheezing or Rhonchi. No rales. Heart: s1s2 Abdomen: cannot be examined as she is lying on her side and says moving makes the pain worse Extremities: left lower extremity swollen Skin: No rashes or lesions. Or bruising Lymph: Cervical, supraclavicular normal. Neurologic: Grossly non-focal Pertinent Labs Lab Results CBC    Component Value Date/Time   WBC 8.8 06/02/2018 0517   RBC 2.89 (L) 06/02/2018 0517   HGB 7.8 (L) 06/02/2018 0517   HCT 25.2 (L) 06/02/2018 0517   PLT 367 06/02/2018 0517   MCV 87.2 06/02/2018 0517   MCH 27.0 06/02/2018 0517   MCHC 31.0 06/02/2018 0517   RDW 14.0 06/02/2018 0517   LYMPHSABS 0.8 05/26/2018 1544   MONOABS 0.7 05/26/2018 1544   EOSABS 0.0 05/26/2018 1544   BASOSABS 0.0 05/26/2018 1544    CMP Latest Ref Rng & Units 06/02/2018 06/01/2018 05/31/2018  Glucose 70 - 99 mg/dL 95 101(H) 99  BUN 8 - 23 mg/dL 14 14 15   Creatinine 0.44 - 1.00 mg/dL 0.66 0.73 0.68  Sodium 135 - 145 mmol/L 135 133(L) 136  Potassium 3.5 - 5.1 mmol/L 3.9 4.1 5.2(H)  Chloride 98 - 111 mmol/L 108 108 109  CO2 22 - 32 mmol/L 20(L) 19(L) 20(L)  Calcium 8.9 - 10.3 mg/dL 7.8(L) 7.8(L) 7.8(L)   Total Protein 6.5 - 8.1 g/dL - 5.3(L) -  Total Bilirubin 0.3 - 1.2 mg/dL - 0.5 -  Alkaline Phos 38 - 126 U/L - 58 -  AST 15 - 41 U/L - 31 -  ALT 0 - 44 U/L - 18 -      Microbiology: Recent Results (from the past 240 hour(s))  Urine culture     Status: None   Collection Time: 05/30/18 12:31 PM  Result Value Ref Range Status   Specimen Description   Final    URINE, RANDOM Performed at [facility], [address] [place], [place] [address]    Special Requests   Final    NONE Performed at [facility], [address] [place], [place] [address]    Culture   Final    NO GROWTH Performed at [facility], [place] [address] [place], [place] [address]    Report Status 05/31/2018 FINAL  Final  Blood Culture (routine x 2)     Status: None (Preliminary result)   Collection Time: 05/30/18 12:32 PM  Result Value Ref Range Status   Specimen Description BLOOD BLOOD LEFT ARM  Final  Special Requests   Final    BOTTLES DRAWN AEROBIC AND ANAEROBIC Blood Culture results may not be optimal due to an excessive volume of blood received in culture bottles   Culture   Final    NO GROWTH 3 DAYS Performed at [facility], [address] [place], [place] [address]    Report Status PENDING  Incomplete  Blood Culture (routine x 2)     Status: None (Preliminary result)   Collection Time: 05/30/18 12:32 PM  Result Value Ref Range Status   Specimen Description BLOOD BLOOD RIGHT WRIST  Final   Special Requests   Final    BOTTLES DRAWN AEROBIC AND ANAEROBIC Blood Culture adequate volume   Culture   Final    NO GROWTH 3 DAYS Performed at [facility], [address], [place], [place] [address]    Report Status PENDING  Incomplete  Gastrointestinal Panel by PCR , Stool     Status: None   Collection Time: 05/30/18 12:52 PM  Result Value Ref Range Status   Campylobacter species NOT DETECTED NOT DETECTED Final   Plesimonas shigelloides NOT DETECTED NOT  DETECTED Final   Salmonella species NOT DETECTED NOT DETECTED Final   Yersinia enterocolitica NOT DETECTED NOT DETECTED Final   Vibrio species NOT DETECTED NOT DETECTED Final   Vibrio cholerae NOT DETECTED NOT DETECTED Final   Enteroaggregative E coli (EAEC) NOT DETECTED NOT DETECTED Final   Enteropathogenic E coli (EPEC) NOT DETECTED NOT DETECTED Final   Enterotoxigenic E coli (ETEC) NOT DETECTED NOT DETECTED Final   Shiga like toxin producing E coli (STEC) NOT DETECTED NOT DETECTED Final   Shigella/Enteroinvasive E coli (EIEC) NOT DETECTED NOT DETECTED Final   Cryptosporidium NOT DETECTED NOT DETECTED Final   Cyclospora cayetanensis NOT DETECTED NOT DETECTED Final   Entamoeba histolytica NOT DETECTED NOT DETECTED Final   Giardia lamblia NOT DETECTED NOT DETECTED Final   Adenovirus F40/41 NOT DETECTED NOT DETECTED Final   Astrovirus NOT DETECTED NOT DETECTED Final   Norovirus GI/GII NOT DETECTED NOT DETECTED Final   Rotavirus A NOT DETECTED NOT DETECTED Final   Sapovirus (I, II, IV, and V) NOT DETECTED NOT DETECTED Final    Comment: Performed at [facility], [address] [place], [place] [address]  C difficile quick scan w PCR reflex     Status: None   Collection Time: 05/30/18 12:52 PM  Result Value Ref Range Status   C Diff antigen NEGATIVE NEGATIVE Final   C Diff toxin NEGATIVE NEGATIVE Final   C Diff interpretation No C. difficile detected.  Final    Comment: Performed at [facility], [address] [place], [place] [address]  MRSA PCR Screening     Status: None   Collection Time: 05/30/18  6:36 PM  Result Value Ref Range Status   MRSA by PCR NEGATIVE NEGATIVE Final    Comment:        The GeneXpert MRSA Assay (FDA approved for NASAL specimens only), is one component of a comprehensive MRSA colonization surveillance program. It is not intended to diagnose MRSA infection nor to guide or monitor treatment for MRSA infections. Performed at [facility], [address] [place], [place] [address]     IMAGING RESULTS: CT abdomen and pelvis 6.9 x 4.6 cm mass is noted in left pelvic sidewall and left iliac fossa consistent with ovarian malignancy or metastatic adenopathy. This is slightly enlarged compared to prior exam.  CXR N  I have  personally reviewed the films ? Impression/Recommendation ? Fever in a patient with metastatic endometrial carcinoma who had TAH/BSO and now has pelvic lymphadenopathy on the left pelvic side wall  Fever unclear but -likely related to the malignancy complication Concern is whether she has bone erosion, concern for vaginal extension VS vaginal infection ( no discharge) concern for rectal erosion?? Concern For severe DVT causing fever,r/o septic thrombophlebitis  She may need xray pelvic bone to look for bone erosion Pelvic examination( she wants it under sedation) Trans vaginal/pelvic  Ultrasound Doppler leg--do doppler or vascular studies of femoral/iliac/veins  On vanco and cefepime with no improvement- Blood culture neg- may DC vanco Will add flagyl for anerobic coverage  No infective endocarditis ( TEE Neg)  H/o PFO and embolic strokes in [facility] at [facility] ,  H/o peptoniphilus bacteremia in 2018 ( anerobic organism)  H/o Severe left DVT causing PE- ? ? Will discuss with oncologist ___________________________________________________ Discussed with patient  software and may include unintentional dictation errors.

## 2018-06-02 NOTE — Progress Notes (Signed)
PT Cancellation Note  Patient Details Name: Vickie Barton MRN: [id number] DOB: [birth year]   Cancelled Treatment:    Reason Eval/Treat Not Completed: Pain limiting ability to participate(Consult received and chart reviewed. Patient declining at this time due to pain in L LE.  RN informed/aware; to administer pain meds as available.  Will re-attempt at later time/date as medically appropriate and available.)   Jahel Wavra H. Owens Shark, PT, DPT, NCS 06/02/18, 10:45 AM [phone]

## 2018-06-02 NOTE — Progress Notes (Signed)
[name] [place] NAME: Vickie Barton    MR#:  [id number]  DATE OF BIRTH:  October 09, 1930  SUBJECTIVE:  CHIEF COMPLAINT:   Chief Complaint  Patient presents with  . Fever  . Weakness  . Nausea  . Emesis   No new complaints this morning.  Patient noted to have had fevers with temperature of 101.2 last night.  No source of infectious process found so far.  Infectious disease consult placed.  REVIEW OF SYSTEMS:  Review of Systems  Constitutional: Negative for chills and fever.  HENT: Negative for hearing loss and tinnitus.   Eyes: Negative for blurred vision and double vision.  Respiratory: Negative for cough and hemoptysis.   Cardiovascular: Negative for chest pain and palpitations.  Gastrointestinal: Negative for heartburn, nausea and vomiting.  Genitourinary: Negative for dysuria and urgency.  Musculoskeletal: Negative for myalgias and neck pain.  Skin: Negative for itching and rash.  Neurological: Negative for dizziness and headaches.  Psychiatric/Behavioral: Negative for depression and hallucinations.      DRUG ALLERGIES:   Allergies  Allergen Reactions  . Sulfa Antibiotics Rash  . Zinc Rash   VITALS:  Blood pressure 105/78, pulse 71, temperature 99.6 F (37.6 C), temperature source Oral, resp. rate 20, height 5\' 2"  (1.575 m), weight 61.2 kg, SpO2 96 %. PHYSICAL EXAMINATION:  Physical Exam  GENERAL:  85 y.o.-year-old patient lying in the bed with no acute distress.  EYES: Pupils equal, round, reactive to light and accommodation. No scleral icterus. Extraocular muscles intact.  HEENT: Head atraumatic, normocephalic. Oropharynx and nasopharynx clear.  NECK:  Supple, no jugular venous distention. No thyroid enlargement, no tenderness.  LUNGS: Normal breath sounds bilaterally, no wheezing, rales,rhonchi or crepitation. No use of accessory muscles of respiration.  CARDIOVASCULAR: S1, S2 normal. No murmurs, rubs, or  gallops.  ABDOMEN: Soft, No tenderness , nondistended. Bowel sounds present. No organomegaly or mass.  EXTREMITIES: No pedal edema, cyanosis, or clubbing.  NEUROLOGIC: Cranial nerves II through XII are intact. Muscle strength 5/5 in all extremities. Sensation intact. Gait not checked.  PSYCHIATRIC: The patient is alert and oriented x 3.  SKIN: No obvious rash, lesion, or ulcer.  LABORATORY PANEL:  Female CBC Recent Labs  Lab 06/02/18 0517  WBC 8.8  HGB 7.8*  HCT 25.2*  PLT 367   ------------------------------------------------------------------------------------------------------------------ Chemistries  Recent Labs  Lab 06/01/18 0259 06/02/18 0517  NA 133* 135  K 4.1 3.9  CL 108 108  CO2 19* 20*  GLUCOSE 101* 95  BUN 14 14  CREATININE 0.73 0.66  CALCIUM 7.8* 7.8*  MG  --  1.9  AST 31  --   ALT 18  --   ALKPHOS 58  --   BILITOT 0.5  --    RADIOLOGY:  No results found. ASSESSMENT AND PLAN:   85 year old female patient with a known history of DVT in the left leg, endometrial cancer, status post chemotherapy and radiation therapy, hypertension, hypothyroidism, chronic congestive heart failure presented to the emergency room for generalized weakness, nausea and vomiting and diarrhea.  Diagnosed with sepsis.  Etiology not yet determined  1. Sepsis Unknown etiology.  Patient was initially managed in the ICU and subsequently transferred to the medical floor.  Patient on broad-spectrum IV antibiotics with vancomycin and cefepime . Patient noted to have fevers with temperature of 101.2 last night despite being on broad-spectrum IV antibiotics.  So far no evidence of infectious process found.  Influenza test negative.  TEE done yesterday with no vegetation. Requested for infectious disease consultation today   2.  Non-ST MI elevation Patient currently on heparin drip.  Denies any chest pain. 2D echocardiogram done with evidence of ischemic cardiomyopathy with ejection  fraction of 30 to 35%.  Unable to exclude underlying ischemia. Patient had TEE done this morning due to concern to rule out endocarditis..  No valvular vegetation noted as such endocarditis ruled out.  Ejection fraction of 30 to 35% noted with severe hypokinesis of the left ventricle. Cardiologist confirmed that patient is not a candidate for cardiac catheterization at this time due to anemia and unable to reposition patient secondary to severe pains..  Continue medical management.  Continue aspirin and beta-blocker.  Add statins.  3.  Chronic systolic CHF Patient with heart failure with reduced ejection fraction.  Stable.  4.  Mild hyperkalemia Resolved  5.Acute diarrhea self-limiting; appears resolved C. difficile negative.  6.Endometrial and ovarian malignancy Patient seen by oncologist.  Given declining performance status and multiple medical problems including cardiac issues, patient currently not a candidate for additional chemotherapy.  If performance status improves, they will reconsider in the future Oncology follow-up  7. History of DVT left leg Patient was on Eliquis prior to admission which is currently on hold due to patient being on heparin drip at this time due to management of non-ST MI elevation.  Plan to transition from heparin drip back to Eliquis between today and tomorrow as long as hemoglobin remained stable  8.  Anemia Etiology not clear.  Noted drop in hemoglobin to 7.6 yesterday.  Hemoglobin stable at 7.8 today.. Clinically no evidence of bleeding.  Iron studies requested. Follow-up CBC in a.m.  DVT prophylaxis; patient currently on heparin drip Disposition; case manager working on rehab placement  All the records are reviewed and case discussed with Care Management/Social Worker. Management plans discussed with the patient, family and they are in agreement.  CODE STATUS: DNR  TOTAL TIME TAKING CARE OF THIS PATIENT: 37 minutes.   More than 50% of the time  was spent in counseling/coordination of care: YES  POSSIBLE D/C IN 2 DAYS, DEPENDING ON CLINICAL CONDITION.   Saman Umstead M.D on 06/02/2018 at 2:34 PM  Between 7am to 6pm - Pager - [phone]  After 6pm go to [email or web address] - Proofreader  [facility]  Office  [phone]  CC: Primary care physician; [name], MD  Note: This dictation was prepared with Dragon dictation along with smaller phrase technology. Any transcriptional errors that result from this process are unintentional.

## 2018-06-02 NOTE — Care Management Important Message (Addendum)
Important Message  Patient Details  Name: Vickie Barton MRN: [id number] Date of Birth: [birth year]   Medicare Important Message Given:  Yes    Dannette Barbara 06/02/2018, 1:07 PM

## 2018-06-02 NOTE — Evaluation (Signed)
Physical Therapy Evaluation Patient Details Name: Vickie Barton MRN: [id number] DOB: [birth year] Today's Date: 06/02/2018   History of Present Illness  presented to ER secondary to progressive weakness, nausea/vomiting and diarrhea; admitted with sepsis of unknown etiology, NSTEMI (peak troponin 7.86, trending down). Of note, PMH signfiicant for endometrial/uterine CA with large L sided mass  Clinical Impression  Patient status post pain meds with improvement noted in pain control; now agreeable to session.  Eager for [place] and motivated to recovery functional ability as able.  Generally weak and deconditioned throughout LE > UEs; however, demonstrates strength at least 3-/5 throughout.  L LE very guarded, cautious due to anticipated pain (but none reported).  Currently requiring mod assist for bed mobility; mod assist +2 for sit/stand, basic transfers and gait (5') with RW.  Difficulty unweighting/advancing L LE; consistent cuing for bilat TKE in loading; constant hands-on assist +2 for balance, overall support.  Will continue to assess/progress as medically appropriate. Would benefit from skilled PT to address above deficits and promote optimal return to PLOF; recommend transition to STR upon discharge from acute hospitalization.     Follow Up Recommendations SNF    Equipment Recommendations       Recommendations for Other Services       Precautions / Restrictions Precautions Precautions: Fall Restrictions Weight Bearing Restrictions: No      Mobility  Bed Mobility Overal bed mobility: Needs Assistance Bed Mobility: Supine to Sit     Supine to sit: Mod assist;+2 for safety/equipment     General bed mobility comments: assist for LE position and truncal elevation  Transfers Overall transfer level: Needs assistance Equipment used: Rolling walker (2 wheeled) Transfers: Sit to/from Stand Sit to Stand: Mod assist;+2 physical assistance         General transfer comment:  assist for lift off, balance/stability; cuing for bilat TKE  Ambulation/Gait Ambulation/Gait assistance: Mod assist;+2 physical assistance Gait Distance (Feet): 5 Feet Assistive device: Rolling walker (2 wheeled)       General Gait Details: very short, shuffling steps; difficulty unweighting/advancing L LE; poor balance.  Patient very pleased by progress and ability this date  Stairs            Wheelchair Mobility    Modified Rankin (Stroke Patients Only)       Balance Overall balance assessment: Needs assistance Sitting-balance support: No upper extremity supported;Feet supported Sitting balance-Leahy Scale: Fair     Standing balance support: Bilateral upper extremity supported Standing balance-Leahy Scale: Poor                               Pertinent Vitals/Pain Pain Assessment: No/denies pain    Home Living Family/patient expects to be discharged to:: Private residence Living Arrangements: Children Available Help at Discharge: Family;Available 24 hours/day Type of Home: House Home Access: Stairs to enter Entrance Stairs-Rails: Left Entrance Stairs-Number of Steps: 2 Home Layout: Two level;Able to live on main level with bedroom/bathroom Home Equipment: Gilford Rile - 2 wheels;Wheelchair - [employer] - 4 wheels      Prior Function Level of Independence: Independent         Comments: Indep with ADLs, household and community mobilization; denies fall history.  Does endorse 2 week decline immediately prior to admission.     Hand Dominance        Extremity/Trunk Assessment   Upper Extremity Assessment Upper Extremity Assessment: Overall WFL for tasks assessed    Lower Extremity Assessment  Lower Extremity Assessment: Generalized weakness(grossly 4-/5 throughout; very guarded, fearful of pain to L LE with movement (but remains 0/10 throughout session))       Communication   Communication: No difficulties  Cognition Arousal/Alertness:  Awake/alert Behavior During Therapy: WFL for tasks assessed/performed Overall Cognitive Status: Within Functional Limits for tasks assessed                                 General Comments: very eager, motivated for OOB to chair      General Comments      Exercises Other Exercises Other Exercises: Sit/stand with RW x2, mod assist +2 for lift off, balance Other Exercises: Oral care and light grooming in supported sitting, set up/sup   Assessment/Plan    PT Assessment Patient needs continued PT services  PT Problem List Decreased strength;Decreased range of motion;Decreased activity tolerance;Decreased balance;Decreased mobility;Decreased coordination;Decreased knowledge of use of DME;Decreased safety awareness;Decreased knowledge of precautions;Pain       PT Treatment Interventions DME instruction;Gait training;Stair training;Functional mobility training;Therapeutic activities;Therapeutic exercise;Balance training;Patient/family education    PT Goals (Current goals can be found in the Care Plan section)  Acute Rehab PT Goals Patient Stated Goal: to be able to walk again PT Goal Formulation: With patient Time For Goal Achievement: 06/16/18 Potential to Achieve Goals: Good    Frequency Min 2X/week   Barriers to discharge Decreased caregiver support      Co-evaluation               AM-PAC PT "6 Clicks" Mobility  Outcome Measure Help needed turning from your back to your side while in a flat bed without using bedrails?: A Lot Help needed moving from lying on your back to sitting on the side of a flat bed without using bedrails?: A Lot Help needed moving to and from a bed to a chair (including a wheelchair)?: A Lot Help needed standing up from a chair using your arms (e.g., wheelchair or bedside chair)?: A Lot Help needed to walk in hospital room?: A Lot Help needed climbing 3-5 steps with a railing? : Total 6 Click Score: 11    End of Session  Equipment Utilized During Treatment: Gait belt Activity Tolerance: Patient tolerated treatment well Patient left: in chair;with call bell/phone within reach;with chair alarm set Nurse Communication: Mobility status PT Visit Diagnosis: Muscle weakness (generalized) (M62.81);Difficulty in walking, not elsewhere classified (R26.2);Pain Pain - Right/Left: Left Pain - part of body: Leg    Time: 0962-8366 PT Time Calculation (min) (ACUTE ONLY): 34 min   Charges:   PT Evaluation $PT Eval Moderate Complexity: 1 Mod PT Treatments $Therapeutic Activity: 8-22 mins        Annarae Macnair H. Owens Shark, PT, DPT, NCS 06/02/18, 1:49 PM [phone]

## 2018-06-02 NOTE — Care Management (Signed)
Patient is requesting [facility].  Called and spoke with Magda Paganini and made her aware of patient request.  PT has recommended SNF.  Magda Paganini is reviewing.

## 2018-06-02 NOTE — Care Management (Signed)
[facility] denied patient.  Called Tina with [facility]; she is reviewing.

## 2018-06-02 NOTE — Progress Notes (Signed)
[place] for heparin drip Indication: chest pain/ACS  Allergies  Allergen Reactions  . Sulfa Antibiotics Rash  . Zinc Rash    Patient Measurements: Height: 5\' 2"  (157.5 cm) Weight: 135 lb (61.2 kg) IBW/kg (Calculated) : 50.1 Heparin Dosing Weight: 53 kg  Vital Signs: Temp: 99.6 F (37.6 C) (03/12 0602) Temp Source: Oral (03/12 0602) BP: 99/56 (03/12 0536) Pulse Rate: 74 (03/12 0536)  Labs: Recent Labs    05/30/18 1848 05/31/18 0106 05/31/18 0353 05/31/18 1497  06/01/18 0259 06/01/18 1953 06/02/18 0517  HGB  --   --  7.6*  --   --  7.6*  --  7.8*  HCT  --   --  24.9*  --   --  24.7*  --  25.2*  PLT  --   --  396  --   --  380  --  367  APTT  --   --  58*  --    < > 59* 62* 76*  LABPROT  --   --  32.2*  --   --   --   --   --   INR  --   --  3.2*  --   --   --   --   --   HEPARINUNFRC  --   --  >3.60*  --   --  2.98*  --   --   CREATININE  --  0.75 0.68  --   --  0.73  --  0.66  TROPONINI 7.86* 6.36*  --  5.76*  --   --   --   --    < > = values in this interval not displayed.    Estimated Creatinine Clearance: 44.2 mL/min (by C-G formula based on SCr of 0.66 mg/dL).   Medical History: Past Medical History:  Diagnosis Date  . Arthritis   . CHF (congestive heart failure) ([place])   . Constipation   . DVT (deep venous thrombosis) ([place]) 03/2017   left leg  . Endometrial cancer ([place]) 01/28/2016   Hysterectomy, chemo + rad tx's, also internal brachytherapy on 02/2018.   [name] Hyperlipidemia   . Hypertension   . Hypothyroidism     Medications:  Scheduled:  . aspirin EC  81 mg Oral Daily  . atorvastatin  10 mg Oral q1800  . calcium-vitamin D  1 tablet Oral Daily  . levothyroxine  150 mcg Oral Q0600  . metoprolol tartrate  12.5 mg Oral BID  . oxyCODONE  15 mg Oral Q12H    Assessment: Patient admitted for fever, weakness w/ h/o ovarian malignancy and h/o DVT anticoagulated w/ eliquis. On arrival trops were 7.86 >> 6.36;  EKG showing ST elevation in leads V II, III, and IV. Patient received the last dose of eliquis 10 mg on 03/09 @ 2200. Heparin is currently infusing at 700 units/hr.   3/10 @1151  aPTT 72 @ 700 units/hr 3/10 @ 1824 aPTT 53 @ 700 units/hr 3/11 @ 0300 aPTT 59 @ 800 units/hr subtherapeutic rate increased to 900 units/hr 3/11 @ 1953 aPTT 62 @ 900 units/hr  Goal of Therapy:  APTT 66 - 102 seconds  Heparin level 0.3-0.7 units/ml once heparin level and aPTT correlate.  Monitor platelets by anticoagulation protocol: Yes   Plan:  03/12 @ 0500 aPTT 76 seconds therapeutic. Will continue current rate and will recheck aPTT @1100 , HL w/ am labs. hgb still low will continue to monitor.  Tobie Lords, PharmD, BCPS Clinical Pharmacist  06/02/2018  

## 2018-06-02 NOTE — Progress Notes (Signed)
[place] for heparin drip Indication: chest pain/ACS  Allergies  Allergen Reactions  . Sulfa Antibiotics Rash  . Zinc Rash    Patient Measurements: Height: 5\' 2"  (157.5 cm) Weight: 135 lb (61.2 kg) IBW/kg (Calculated) : 50.1 Heparin Dosing Weight: 53 kg  Vital Signs: Temp: 99.6 F (37.6 C) (03/12 0602) Temp Source: Oral (03/12 0602) BP: 105/78 (03/12 0834) Pulse Rate: 71 (03/12 0834)  Labs: Recent Labs    05/30/18 1848  05/31/18 0106  05/31/18 0353 05/31/18 1245  06/01/18 0259 06/01/18 1953 06/02/18 0517 06/02/18 1113  HGB  --   --   --    < > 7.6*  --   --  7.6*  --  7.8*  --   HCT  --   --   --   --  24.9*  --   --  24.7*  --  25.2*  --   PLT  --   --   --   --  396  --   --  380  --  367  --   APTT  --   --   --   --  58*  --    < > 59* 62* 76* 80*  LABPROT  --   --   --   --  32.2*  --   --   --   --   --   --   INR  --   --   --   --  3.2*  --   --   --   --   --   --   HEPARINUNFRC  --   --   --   --  >3.60*  --   --  2.98*  --  1.57*  --   CREATININE  --    < > 0.75  --  0.68  --   --  0.73  --  0.66  --   TROPONINI 7.86*  --  6.36*  --   --  5.76*  --   --   --   --   --    < > = values in this interval not displayed.    Estimated Creatinine Clearance: 44.2 mL/min (by C-G formula based on SCr of 0.66 mg/dL).   Medical History: Past Medical History:  Diagnosis Date  . Arthritis   . CHF (congestive heart failure) ([place])   . Constipation   . DVT (deep venous thrombosis) ([place]) 03/2017   left leg  . Endometrial cancer ([place]) 01/28/2016   Hysterectomy, chemo + rad tx's, also internal brachytherapy on 02/2018.   [name] Hyperlipidemia   . Hypertension   . Hypothyroidism     Medications:  Scheduled:  . aspirin EC  81 mg Oral Daily  . atorvastatin  10 mg Oral q1800  . calcium-vitamin D  1 tablet Oral Daily  . levothyroxine  150 mcg Oral Q0600  . metoprolol tartrate  12.5 mg Oral BID  . oxyCODONE  15 mg Oral Q12H  .  sodium chloride flush  3 mL Intravenous Q12H    Assessment: Patient admitted for fever, weakness w/ h/o ovarian malignancy and h/o DVT anticoagulated w/ eliquis. On arrival trops were 7.86 >> 6.36; EKG showing ST elevation in leads V II, III, and IV. Patient received the last dose of eliquis 10 mg on 03/09 @ 2200. Heparin is currently infusing at 700 units/hr.   3/10 @1151  aPTT 72 @ 700 units/hr 3/10 @ 8099  aPTT 53 @ 700 units/hr 3/11 @ 0300 aPTT 59 @ 800 units/hr subtherapeutic rate increased to 900 units/hr 3/11 @ 1953 aPTT 62 @ 900 units/hr subtherapeutic rate increased to 1000 units/hr 3/12 @ 0500 aPTT 76 @ 1000units/hr (therapeutic x 1)  Goal of Therapy:  APTT 66 - 102 seconds  Heparin level 0.3-0.7 units/ml once heparin level and aPTT correlate.  Monitor platelets by anticoagulation protocol: Yes   Plan:  03/12 @ 1113 aPTT 80 seconds therapeutic x 2   Will continue current rate and will recheck aPTT and HL w/ am labs. Will stop following aPTT once levels correlate.    hgb still low will continue to monitor.  Lu Duffel, PharmD, BCPS Clinical Pharmacist 06/02/2018 2:26 PM

## 2018-06-03 ENCOUNTER — Inpatient Hospital Stay: Payer: Medicare Other

## 2018-06-03 DIAGNOSIS — C577 Malignant neoplasm of other specified female genital organs: Secondary | ICD-10-CM

## 2018-06-03 DIAGNOSIS — I252 Old myocardial infarction: Secondary | ICD-10-CM

## 2018-06-03 DIAGNOSIS — I25118 Atherosclerotic heart disease of native coronary artery with other forms of angina pectoris: Secondary | ICD-10-CM

## 2018-06-03 DIAGNOSIS — Z8774 Personal history of (corrected) congenital malformations of heart and circulatory system: Secondary | ICD-10-CM

## 2018-06-03 DIAGNOSIS — R5081 Fever presenting with conditions classified elsewhere: Secondary | ICD-10-CM

## 2018-06-03 DIAGNOSIS — R591 Generalized enlarged lymph nodes: Secondary | ICD-10-CM

## 2018-06-03 LAB — CBC
HCT: 25.4 % — ABNORMAL LOW (ref 36.0–46.0)
Hemoglobin: 7.6 g/dL — ABNORMAL LOW (ref 12.0–15.0)
MCH: 26.8 pg (ref 26.0–34.0)
MCHC: 29.9 g/dL — ABNORMAL LOW (ref 30.0–36.0)
MCV: 89.4 fL (ref 80.0–100.0)
Platelets: 391 10*3/uL (ref 150–400)
RBC: 2.84 MIL/uL — ABNORMAL LOW (ref 3.87–5.11)
RDW: 14 % (ref 11.5–15.5)
WBC: 7.5 10*3/uL (ref 4.0–10.5)
nRBC: 0 % (ref 0.0–0.2)

## 2018-06-03 LAB — HEPARIN LEVEL (UNFRACTIONATED): Heparin Unfractionated: 0.93 IU/mL — ABNORMAL HIGH (ref 0.30–0.70)

## 2018-06-03 LAB — BASIC METABOLIC PANEL
Anion gap: 8 (ref 5–15)
BUN: 12 mg/dL (ref 8–23)
CO2: 20 mmol/L — AB (ref 22–32)
Calcium: 7.9 mg/dL — ABNORMAL LOW (ref 8.9–10.3)
Chloride: 107 mmol/L (ref 98–111)
Creatinine, Ser: 0.63 mg/dL (ref 0.44–1.00)
GFR calc Af Amer: >60 mL/min (ref >60)
GFR calc non Af Amer: >60 mL/min (ref >60)
Glucose, Bld: 89 mg/dL (ref 70–99)
Potassium: 3.6 mmol/L (ref 3.5–5.1)
Sodium: 135 mmol/L (ref 135–145)

## 2018-06-03 LAB — MAGNESIUM: Magnesium: 2 mg/dL (ref 1.7–2.4)

## 2018-06-03 LAB — APTT: aPTT: 69 seconds — ABNORMAL HIGH (ref 24–36)

## 2018-06-03 MED ORDER — SENNA 8.6 MG PO TABS: 1.0000 | ORAL_TABLET | Freq: Every day | ORAL | Status: DC | PRN

## 2018-06-03 MED ORDER — METRONIDAZOLE 500 MG PO TABS
500.0000 mg | ORAL_TABLET | Freq: Three times a day (TID) | ORAL | Status: DC
  Filled 2018-06-03: qty 1

## 2018-06-03 MED ORDER — SODIUM CHLORIDE 0.9 % IV SOLN
3.0000 g | Freq: Four times a day (QID) | INTRAVENOUS | Status: DC
  Administered 2018-06-03 – 2018-06-07 (×16): 3 g via INTRAVENOUS
  Filled 2018-06-03 (×20): qty 3

## 2018-06-03 MED ORDER — OXYCODONE HCL ER 15 MG PO T12A
15.0000 mg | EXTENDED_RELEASE_TABLET | Freq: Three times a day (TID) | ORAL | Status: DC
  Administered 2018-06-03 – 2018-06-05 (×7): 15 mg via ORAL
  Filled 2018-06-03 (×7): qty 1

## 2018-06-03 MED ORDER — APIXABAN 5 MG PO TABS
5.0000 mg | ORAL_TABLET | Freq: Two times a day (BID) | ORAL | Status: DC
  Administered 2018-06-03 – 2018-06-07 (×8): 5 mg via ORAL
  Filled 2018-06-03 (×8): qty 1

## 2018-06-03 MED ORDER — HYDROMORPHONE HCL 1 MG/ML IJ SOLN
0.5000 mg | INTRAMUSCULAR | Status: DC | PRN
  Administered 2018-06-05 – 2018-06-07 (×3): 0.5 mg via INTRAVENOUS
  Filled 2018-06-03 (×3): qty 1

## 2018-06-03 MED ORDER — SODIUM CHLORIDE 0.9 % IV SOLN
2.0000 g | Freq: Two times a day (BID) | INTRAVENOUS | Status: DC
  Administered 2018-06-03: 2 g via INTRAVENOUS
  Filled 2018-06-03 (×3): qty 2

## 2018-06-03 MED ORDER — OXYCODONE HCL 5 MG PO TABS
15.0000 mg | ORAL_TABLET | ORAL | Status: DC | PRN
  Administered 2018-06-04 – 2018-06-06 (×6): 15 mg via ORAL
  Filled 2018-06-03 (×6): qty 3

## 2018-06-03 NOTE — Progress Notes (Signed)
Pharmacy Antibiotic Note  Vickie Barton is a 85 y.o. female admitted on 05/30/2018 with sepsis.  Pharmacy has been consulted for Cefepime dosing.  Plan: Change Cefepime to 2gm q12h   Height: 5\' 2"  (157.5 cm) Weight: 135 lb (61.2 kg) IBW/kg (Calculated) : 50.1  Temp (24hrs), Avg:98.9 F (37.2 C), Min:98.3 F (36.8 C), Max:99.4 F (37.4 C)  Recent Labs  Lab 05/30/18 1134 05/30/18 1232 05/30/18 1607 05/31/18 0106 05/31/18 0117 05/31/18 0353 05/31/18 9977 06/01/18 0259 06/02/18 0517 06/03/18 0449  WBC 10.8*  --   --   --   --  14.4*  --  10.7* 8.8 7.5  CREATININE 0.81  --   --  0.75  --  0.68  --  0.73 0.66 0.63  LATICACIDVEN  --  7.0* 2.4*  --  1.4 1.1 1.0  --   --   --     Estimated Creatinine Clearance: 44.2 mL/min (by C-G formula based on SCr of 0.63 mg/dL).    Allergies  Allergen Reactions  . Sulfa Antibiotics Rash  . Zinc Rash    Antimicrobials this admission: 3/9 Vancomycin >> 3/13 3/9  Cefepime >>  3/9  Metronidazole >> Stopped 3/9 >> Restarted 3/13  Microbiology results: 3/9 BCx: NG x 4 days 3/9 UCx: NG final 3/9 MRSA PCR Neg 3/9 GI Panel/C Diff PCR negative  Thank you for allowing pharmacy to be a part of this patient's care.  Lu Duffel, PharmD, BCPS Clinical Pharmacist 06/03/2018 9:34 AM

## 2018-06-03 NOTE — Progress Notes (Signed)
[place] for heparin drip Indication: chest pain/ACS  Allergies  Allergen Reactions  . Sulfa Antibiotics Rash  . Zinc Rash    Patient Measurements: Height: 5\' 2"  (157.5 cm) Weight: 135 lb (61.2 kg) IBW/kg (Calculated) : 50.1 Heparin Dosing Weight: 53 kg  Vital Signs: Temp: 98.6 F (37 C) (03/13 0434) Temp Source: Oral (03/13 0434) BP: 106/58 (03/13 0434) Pulse Rate: 73 (03/13 0434)  Labs: Recent Labs    06/01/18 0259  06/02/18 0517 06/02/18 1113 06/03/18 0449  HGB 7.6*  --  7.8*  --  7.6*  HCT 24.7*  --  25.2*  --  25.4*  PLT 380  --  367  --  391  APTT 59*   < > 76* 80* 69*  HEPARINUNFRC 2.98*  --  1.57*  --  0.93*  CREATININE 0.73  --  0.66  --  0.63   < > = values in this interval not displayed.    Estimated Creatinine Clearance: 44.2 mL/min (by C-G formula based on SCr of 0.63 mg/dL).    Medical History: Past Medical History:  Diagnosis Date  . Arthritis   . CHF (congestive heart failure) ([place])   . Constipation   . DVT (deep venous thrombosis) ([place]) 03/2017   left leg  . Endometrial cancer ([place]) 01/28/2016   Hysterectomy, chemo + rad tx's, also internal brachytherapy on 02/2018.   [name] Hyperlipidemia   . Hypertension   . Hypothyroidism     Medications:  Scheduled:  . aspirin EC  81 mg Oral Daily  . atorvastatin  10 mg Oral q1800  . calcium-vitamin D  1 tablet Oral Daily  . docusate sodium  100 mg Oral BID  . levothyroxine  150 mcg Oral Q0600  . metoprolol tartrate  12.5 mg Oral BID  . oxyCODONE  15 mg Oral Q12H  . sodium chloride flush  3 mL Intravenous Q12H    Assessment: Patient admitted for fever, weakness w/ h/o ovarian malignancy and h/o DVT anticoagulated w/ eliquis. On arrival trops were 7.86 >> 6.36; EKG showing ST elevation in leads V II, III, and IV. Patient received the last dose of eliquis 10 mg on 03/09 @ 2200. Heparin is currently infusing at 700 units/hr.   3/10 @1151  aPTT 72 @ 700  units/hr 3/10 @ 1824 aPTT 53 @ 700 units/hr 3/11 @ 0300 aPTT 59 @ 800 units/hr subtherapeutic rate increased to 900 units/hr 3/11 @ 1953 aPTT 62 @ 900 units/hr subtherapeutic rate increased to 1000 units/hr 3/12 @ 0500 aPTT 76 @ 1000units/hr (therapeutic x 1)  Goal of Therapy:  APTT 66 - 102 seconds  Heparin level 0.3-0.7 units/ml once heparin level and aPTT correlate.  Monitor platelets by anticoagulation protocol: Yes   Plan:  03/13 @ 0500 aPTT 69 seconds therapeutic; HL 0.93 not correlating. Will continue rate and will recheck aPTT/HL w/ am labs.  Tobie Lords, PharmD, BCPS Clinical Pharmacist 06/03/2018 7:24 AM

## 2018-06-03 NOTE — Progress Notes (Signed)
[facility] NAME: Vickie Barton    MR#:  [id number]  DATE OF BIRTH:  [birth year]  SUBJECTIVE:  CHIEF COMPLAINT:   Chief Complaint  Patient presents with  . Fever  . Weakness  . Nausea  . Emesis   No fevers in the last 24 hours.  Patient still having significant lower extremity pain when moved around in bed.  Reevaluated by palliative care team today to assist with pain management.  REVIEW OF SYSTEMS:  Review of Systems  Constitutional: Negative for chills and fever.  HENT: Negative for hearing loss and tinnitus.   Eyes: Negative for blurred vision and double vision.  Respiratory: Negative for cough and hemoptysis.   Cardiovascular: Negative for chest pain and palpitations.  Gastrointestinal: Negative for heartburn, nausea and vomiting.  Genitourinary: Negative for dysuria and urgency.  Musculoskeletal: Negative for myalgias and neck pain.  Skin: Negative for itching and rash.  Neurological: Negative for dizziness and headaches.  Psychiatric/Behavioral: Negative for depression and hallucinations.      DRUG ALLERGIES:   Allergies  Allergen Reactions  . Sulfa Antibiotics Rash  . Zinc Rash   VITALS:  Blood pressure (!) 109/55, pulse 69, temperature 99.9 F (37.7 C), temperature source Axillary, resp. rate 16, height 5\' 2"  (1.575 m), weight 61.2 kg, SpO2 97 %. PHYSICAL EXAMINATION:  Physical Exam  GENERAL:  85 y.o.-year-old patient lying in the bed with no acute distress.  EYES: Pupils equal, round, reactive to light and accommodation. No scleral icterus. Extraocular muscles intact.  HEENT: Head atraumatic, normocephalic. Oropharynx and nasopharynx clear.  NECK:  Supple, no jugular venous distention. No thyroid enlargement, no tenderness.  LUNGS: Normal breath sounds bilaterally, no wheezing, rales,rhonchi or crepitation. No use of accessory muscles of respiration.  CARDIOVASCULAR: S1, S2 normal. No murmurs, rubs,  or gallops.  ABDOMEN: Soft, No tenderness , nondistended. Bowel sounds present. No organomegaly or mass.  EXTREMITIES: No pedal edema, cyanosis, or clubbing.  Significant pain /tenderness with movement of lower extremity  NEUROLOGIC: Cranial nerves II through XII are intact. Muscle strength 5/5 in all extremities. Sensation intact. Gait not checked.  PSYCHIATRIC: The patient is alert and oriented x 3.  SKIN: No obvious rash, lesion, or ulcer.  LABORATORY PANEL:  Female CBC Recent Labs  Lab 06/03/18 0449  WBC 7.5  HGB 7.6*  HCT 25.4*  PLT 391   ------------------------------------------------------------------------------------------------------------------ Chemistries  Recent Labs  Lab 06/01/18 0259  06/03/18 0449  NA 133*   < > 135  K 4.1   < > 3.6  CL 108   < > 107  CO2 19*   < > 20*  GLUCOSE 101*   < > 89  BUN 14   < > 12  CREATININE 0.73   < > 0.63  CALCIUM 7.8*   < > 7.9*  MG  --    < > 2.0  AST 31  --   --   ALT 18  --   --   ALKPHOS 58  --   --   BILITOT 0.5  --   --    < > = values in this interval not displayed.   RADIOLOGY:  Dg Hip Unilat With Pelvis 2-3 Views Left  Result Date: 06/03/2018 CLINICAL DATA:  Chronic bilateral hip pain. Left side worse than right. EXAM: DG HIP (WITH OR WITHOUT PELVIS) 2-3V LEFT COMPARISON:  CT 05/30/2018. FINDINGS: Diffuse osteopenia. Degenerative changes lumbar spine and both hips. No acute  bony abnormality identified. No evidence of fracture dislocation. Aortoiliac atherosclerotic vascular calcification. IMPRESSION: Diffuse osteopenia. Degenerative changes lumbar spine and both hips. No acute bony abnormality. Electronically Signed   By: [name]  Register   On: 06/03/2018 08:56   Dg Hip Unilat With Pelvis 2-3 Views Right  Result Date: 06/03/2018 CLINICAL DATA:  Chronic RIGHT hip pain. EXAM: DG HIP (WITH OR WITHOUT PELVIS) 2-3V RIGHT COMPARISON:  05/20/2018 radiographs and prior studies FINDINGS: No fracture or dislocation. Mild  degenerative change in both hips and LOWER lumbar spine again noted. No suspicious focal bony lesions noted. IMPRESSION: 1. No acute abnormality 2. Mild degenerative changes in both hips and LOWER lumbar spine. Electronically Signed   By: [name] M.D.   On: 06/03/2018 09:00   ASSESSMENT AND PLAN:   85 year old female patient with a known history of DVT in the left leg, endometrial cancer, status post chemotherapy and radiation therapy, hypertension, hypothyroidism, chronic congestive heart failure presented to the emergency room for generalized weakness, nausea and vomiting and diarrhea.  Diagnosed with sepsis.  Etiology not yet determined  1. Sepsis Unknown etiology.  Patient was initially managed in the ICU and subsequently transferred to the medical floor.  Patient was initially placed on broad-spectrum IV antibiotics with vancomycin and cefepime . Patient noted to have fevers with temperature of 101.2 two nights ago.  No fevers however in the last 24 hours. So far no evidence of infectious process found.  Influenza test negative.  TEE done 2 days ago with no vegetation. Seen by infectious disease specialist.  Appreciate input.  Fevers likely related to underlying malignancy.  Due to concern for possible bone erosion or concern for vaginal extension, recommended transvaginal ultrasound and bilateral lower extremity venous Doppler ultrasound to rule out new DVT.  Due to severity of lower extremity pains, unable to move patient around for any further studies unless patient has conscious sedation prior to imaging.  Patient however not keen on any further imaging since this would not necessarily change management.  Patient already on anticoagulation for DVT.  Patient also not a candidate for any chemotherapy for underlying malignancy as confirmed by oncologist.   2.  Non-ST MI elevation Patient currently on heparin drip.  Denies any chest pain. 2D echocardiogram done with evidence of ischemic  cardiomyopathy with ejection fraction of 30 to 35%.  Unable to exclude underlying ischemia. Patient had TEE done due to concern to rule out endocarditis..  No valvular vegetation noted as such endocarditis ruled out.  Ejection fraction of 30 to 35% noted with severe hypokinesis of the left ventricle. Cardiologist confirmed that patient is not a candidate for cardiac catheterization at this time due to anemia and unable to reposition patient secondary to severe pains..  Continue medical management.  Continue aspirin and beta-blocker.  Add statins.  3.  Chronic systolic CHF Patient with heart failure with reduced ejection fraction.  Stable.  4.  Mild hyperkalemia Resolved  5.Acute diarrhea self-limiting; appears resolved C. difficile negative.  6.Endometrial and ovarian malignancy Patient seen by oncologist.  Given declining performance status and multiple medical problems including cardiac issues, patient currently not a candidate for additional chemotherapy.  If performance status improves, they will reconsider in the future Oncology follow-up  7. History of DVT left leg Patient was on Eliquis prior to admission which was initially placed on hold due to patient being placed on heparin drip for management of non-ST MI elevation.  Heparin drip to be discontinued this afternoon.  Will transition  to home dose of Eliquis  8.  Anemia Etiology not clear.  Noted drop in hemoglobin to 7.6 yesterday.  Hemoglobin stable at 7.6 today.. Clinically no evidence of bleeding.  Iron studies requested. Follow-up CBC in a.m.  9.  Lower extremity pains Patient does have significant pains when moved around.  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 Seen by palliative care team again today for pain management.  Patient currently not does not want involvement of hospice.  Wishes to go to rehab on discharge.  Increased dose of pain meds including adding PRN IV  Dilaudid. I discussed the option of nerve block with interventional radiologist Dr. Annamaria Boots who stated this is usually done by anesthesiology/pain management.  I have placed a page to the anesthesiologist on call for input  DVT prophylaxis; patient now being started back on Eliquis Disposition; case manager working on rehab placement  All the records are reviewed and case discussed with Care Management/Social Worker. Management plans discussed with the patient, family and they are in agreement.  CODE STATUS: DNR  TOTAL TIME TAKING CARE OF THIS PATIENT: 39 minutes.   More than 50% of the time was spent in counseling/coordination of care: YES  POSSIBLE D/C IN 2 DAYS, DEPENDING ON CLINICAL CONDITION.   Ekam Bonebrake M.D on 06/03/2018 at 3:29 PM  Between 7am to 6pm - Pager - [phone]  After 6pm go to [email or web address] - Proofreader  [facility]  Office  [phone]  CC: Primary care physician; [name], MD  Note: This dictation was prepared with Dragon dictation along with smaller phrase technology. Any transcriptional errors that result from this process are unintentional.

## 2018-06-03 NOTE — Progress Notes (Signed)
[place]  [phone] [phone]) [phone]  ID: Marinda Elk Pettingill OB: 1930-09-11  MR#: [id number]  AOZ#:[id number]  Patient Care Team: [name], MD as PCP - General (Family Medicine) Rockey Situ Kathlene November, MD as PCP - Cardiology (Cardiology)  CHIEF COMPLAINT: Endometrial cancer, intractable pain declining performance status.  INTERVAL HISTORY: Patient still has a decreased performance status.  Fever overnight.  States when she lies perfectly still her pain is well controlled, but any movement causes excruciating pain.  She offers no further complaints.  REVIEW OF SYSTEMS:   Review of Systems  Constitutional: Positive for fever. Negative for weight loss.  Respiratory: Negative.  Negative for cough and hemoptysis.   Cardiovascular: Negative.  Negative for chest pain and leg swelling.  Gastrointestinal: Negative.  Negative for abdominal pain.  Genitourinary: Negative.  Negative for dysuria.  Musculoskeletal: Positive for back pain.  Skin: Negative.  Negative for rash.  Neurological: Negative.  Negative for dizziness, focal weakness, weakness and headaches.    As per HPI. Otherwise, a complete review of systems is negative.  PAST MEDICAL HISTORY: Past Medical History:  Diagnosis Date   Arthritis    CHF (congestive heart failure) ([place])    Constipation    DVT (deep venous thrombosis) ([place]) 03/2017   left leg   Endometrial cancer ([place]) 01/28/2016   Hysterectomy, chemo + rad tx's, also internal brachytherapy on 02/2018.    Hyperlipidemia    Hypertension    Hypothyroidism     PAST SURGICAL HISTORY: Past Surgical History:  Procedure Laterality Date   CYSTOSCOPY W/ RETROGRADES Left 01/01/2017   Procedure: CYSTOSCOPY WITH RETROGRADE PYELOGRAM;  Surgeon: Nickie Retort, MD;  Location: [facility];  Service: Urology;  Laterality: Left;   CYSTOSCOPY W/ RETROGRADES Left 04/23/2017   Procedure: CYSTOSCOPY WITH RETROGRADE PYELOGRAM;  Surgeon:  Abbie Sons, MD;  Location: [facility];  Service: Urology;  Laterality: Left;   CYSTOSCOPY W/ URETERAL STENT PLACEMENT Left 04/23/2017   Procedure: CYSTOSCOPY WITH STENT REMOVAL;  Surgeon: Abbie Sons, MD;  Location: [facility];  Service: Urology;  Laterality: Left;   CYSTOSCOPY WITH STENT PLACEMENT Left 01/01/2017   Procedure: CYSTOSCOPY WITH STENT PLACEMENT;  Surgeon: Nickie Retort, MD;  Location: [facility];  Service: Urology;  Laterality: Left;   DILATION AND CURETTAGE OF UTERUS     HYSTEROSCOPY W/D&C N/A 01/28/2016   Procedure: DILATATION AND CURETTAGE /HYSTEROSCOPY;  Surgeon: [name], MD;  Location: [facility];  Service: Gynecology;  Laterality: N/A;   LAPAROSCOPIC BILATERAL SALPINGO OOPHERECTOMY Bilateral 02/26/2016   Procedure: LAPAROSCOPIC BILATERAL SALPINGO OOPHORECTOMY;  Surgeon: [name], MD;  Location: [facility];  Service: Gynecology;  Laterality: Bilateral;   LAPAROSCOPIC HYSTERECTOMY N/A 02/26/2016   Procedure: HYSTERECTOMY TOTAL LAPAROSCOPIC;  Surgeon: [name], MD;  Location: [facility];  Service: Gynecology;  Laterality: N/A;   SENTINEL NODE BIOPSY N/A 02/26/2016   Procedure: SENTINEL NODE BIOPSY;  Surgeon: [name], MD;  Location: [facility];  Service: Gynecology;  Laterality: N/A;   TEE WITHOUT CARDIOVERSION N/A 06/01/2018   Procedure: TRANSESOPHAGEAL ECHOCARDIOGRAM (TEE);  Surgeon: [name], MD;  Location: [facility];  Service: Cardiovascular;  Laterality: N/A;   TONSILLECTOMY      FAMILY HISTORY: Family History  Problem Relation Age of Onset   Diabetes Father    Heart disease Father    Hypertension Father    Stroke Father    Diabetes Paternal Grandmother    Heart disease Mother    Hypertension Mother  Stroke Mother    Hypertension Sister    Thyroid disease Sister    Hypertension Brother     ADVANCED DIRECTIVES (Y/N):  @ADVDIR @  HEALTH MAINTENANCE: Social History   Tobacco Use   Smoking status: Former  Smoker   Smokeless tobacco: Never Used  Substance Use Topics   Alcohol use: No   Drug use: No     Colonoscopy:  PAP:  Bone density:  Lipid panel:  Allergies  Allergen Reactions   Sulfa Antibiotics Rash   Zinc Rash    Current Facility-Administered Medications  Medication Dose Route Frequency Provider Last Rate Last Dose   0.9 %  sodium chloride infusion   Intravenous PRN Stark Jock, Jude, MD 10 mL/hr at 06/03/18 0806 250 mL at 06/03/18 0806   acetaminophen (TYLENOL) tablet 650 mg  650 mg Oral Q6H PRN Saundra Shelling, MD   650 mg at 06/02/18 0550   Or   acetaminophen (TYLENOL) suppository 650 mg  650 mg Rectal Q6H PRN Saundra Shelling, MD       ALPRAZolam Duanne Moron) tablet 0.25 mg  0.25 mg Oral BID PRN Stark Jock, Jude, MD   0.25 mg at 06/03/18 1131   aspirin EC tablet 81 mg  81 mg Oral Daily [name], MD   81 mg at 06/03/18 0811   atorvastatin (LIPITOR) tablet 10 mg  10 mg Oral q1800 [name], MD   10 mg at 06/02/18 1712   calcium-vitamin D (OSCAL WITH D) 500-200 MG-UNIT per tablet 1 tablet  1 tablet Oral Daily Pyreddy, Reatha Harps, MD   1 tablet at 06/03/18 0811   ceFEPIme (MAXIPIME) 2 g in sodium chloride 0.9 % 100 mL IVPB  2 g Intravenous Q12H Shanlever, Charles M, RPH       docusate sodium (COLACE) capsule 100 mg  100 mg Oral BID Ojie, Jude, MD   100 mg at 06/03/18 0812   heparin ADULT infusion 100 units/mL (25000 units/244mL sodium chloride 0.45%)  1,000 Units/hr Intravenous Continuous Ojie, Jude, MD 10 mL/hr at 06/02/18 1711 1,000 Units/hr at 06/02/18 1711   levothyroxine (SYNTHROID, LEVOTHROID) tablet 150 mcg  150 mcg Oral Q0600 Darel Hong D, NP   150 mcg at 06/03/18 0449   metoprolol tartrate (LOPRESSOR) tablet 12.5 mg  12.5 mg Oral BID [name], MD   12.5 mg at 06/03/18 8115   metroNIDAZOLE (FLAGYL) IVPB 500 mg  500 mg Intravenous Q8H Ravishankar, Joellyn Quails, MD 100 mL/hr at 06/03/18 1240 500 mg at 06/03/18 1240   morphine 2 MG/ML injection 2 mg  2  mg Intravenous Q2H PRN Flora Lipps, MD       ondansetron (ZOFRAN) injection 4 mg  4 mg Intravenous Q6H PRN Pyreddy, Reatha Harps, MD       oxyCODONE (Oxy IR/ROXICODONE) immediate release tablet 10 mg  10 mg Oral Q3H PRN Saundra Shelling, MD   10 mg at 06/03/18 1131   oxyCODONE (OXYCONTIN) 12 hr tablet 15 mg  15 mg Oral Q12H Borders, [name], NP   15 mg at 06/03/18 0813   sodium chloride flush (NS) 0.9 % injection 3 mL  3 mL Intravenous Q12H Ojie, Jude, MD   3 mL at 06/03/18 0814   sodium chloride flush (NS) 0.9 % injection 3 mL  3 mL Intravenous PRN Stark Jock Jude, MD        OBJECTIVE: Vitals:   06/03/18 0434 06/03/18 0747  BP: (!) 106/58 (!) 109/55  Pulse: 73 69  Resp:  16  Temp: 98.6 F (37 C)  99.2 F (37.3 C)  SpO2: 98% 97%     Body mass index is 24.69 kg/m.    ECOG FS:4 - Bedbound  General: Ill-appearing, mild distress secondary to pain. Eyes: Pink conjunctiva, anicteric sclera. HEENT: Normocephalic, moist mucous membranes. Lungs: Clear to auscultation bilaterally. Heart: Regular rate and rhythm. No rubs, murmurs, or gallops. Abdomen: Soft, nontender, nondistended. No organomegaly noted, normoactive bowel sounds. Musculoskeletal: No edema, cyanosis, or clubbing. Neuro: Alert, answering all questions appropriately. Cranial nerves grossly intact. Skin: No rashes or petechiae noted. Psych: Normal affect.  LAB RESULTS:  Lab Results  Component Value Date   NA 135 06/03/2018   K 3.6 06/03/2018   CL 107 06/03/2018   CO2 20 (L) 06/03/2018   GLUCOSE 89 06/03/2018   BUN 12 06/03/2018   CREATININE 0.63 06/03/2018   CALCIUM 7.9 (L) 06/03/2018   PROT 5.3 (L) 06/01/2018   ALBUMIN 1.8 (L) 06/01/2018   AST 31 06/01/2018   ALT 18 06/01/2018   ALKPHOS 58 06/01/2018   BILITOT 0.5 06/01/2018   GFRNONAA >60 06/03/2018   GFRAA >60 06/03/2018    Lab Results  Component Value Date   WBC 7.5 06/03/2018   NEUTROABS 9.6 (H) 05/26/2018   HGB 7.6 (L) 06/03/2018   HCT 25.4 (L) 06/03/2018    MCV 89.4 06/03/2018   PLT 391 06/03/2018     STUDIES: Ct Abdomen Pelvis W Contrast  Result Date: 05/30/2018 CLINICAL DATA:  Acute generalized abdominal pain. EXAM: CT ABDOMEN AND PELVIS WITH CONTRAST TECHNIQUE: Multidetector CT imaging of the abdomen and pelvis was performed using the standard protocol following bolus administration of intravenous contrast. CONTRAST:  80mL OMNIPAQUE IOHEXOL 300 MG/ML  SOLN COMPARISON:  CT scan of May 20, 2018. FINDINGS: Lower chest: No acute abnormality. Hepatobiliary: No focal liver abnormality is seen. No gallstones, gallbladder wall thickening, or biliary dilatation. Pancreas: Unremarkable. No pancreatic ductal dilatation or surrounding inflammatory changes. Spleen: Stable lucency seen in the spleen most consistent with hemangioma or cyst. No other splenic abnormality seen. Adrenals/Urinary Tract: Adrenal glands appear normal. Stable right renal cysts are noted. Minimal left hydronephrosis is noted but there is no evidence of obstructing calculus. No definite evidence of ureteral compression is noted. Urinary bladder is unremarkable. Stomach/Bowel: The stomach appears normal. There is no evidence of bowel obstruction or inflammation. The appendix appears unremarkable. Vascular/Lymphatic: Atherosclerosis of abdominal aorta is noted without aneurysm formation. Reproductive: Status post hysterectomy and bilateral oophorectomy. 6.9 x 4.6 cm mass is noted in left side of pelvis and left iliac fossa consistent with ovarian malignancy or metastatic adenopathy. This is slightly enlarged compared to prior exam. Other: No abdominal wall hernia or abnormality. No abdominopelvic ascites. Musculoskeletal: No acute or significant osseous findings. IMPRESSION: 6.9 x 4.6 cm mass is noted in left pelvic sidewall and left iliac fossa consistent with ovarian malignancy or metastatic adenopathy. This is slightly enlarged compared to prior exam. Minimal left hydronephrosis is noted which  is slightly improved compared to prior exam. No ureteral calculus or definite evidence of external compression is noted. Aortic Atherosclerosis (ICD10-I70.0). Electronically Signed   By: Marijo Conception, M.D.   On: 05/30/2018 14:46   Ct Abdomen Pelvis W Contrast  Result Date: 05/20/2018 CLINICAL DATA:  Pt c/o severe Left groin pain radiating down her Left leg x 2 weeks. NKI Hx Endometrial CA diagnosed in 01/2017 and she had total hysterectomy, chemo + rad tx's. Also had internal brachytherapy on 02/2018. EXAM: CT ABDOMEN AND PELVIS WITH CONTRAST TECHNIQUE: Multidetector CT imaging  of the abdomen and pelvis was performed using the standard protocol following bolus administration of intravenous contrast. CONTRAST:  69mL OMNIPAQUE IOHEXOL 300 MG/ML  SOLN COMPARISON:  Current abdomen radiographs. PET-CT, 02/04/2018. Abdomen and pelvis CT, 01/10/2018. FINDINGS: Lower chest: No acute abnormality. Hepatobiliary: Normal liver. Gallbladder is distended but otherwise unremarkable. No bile duct dilation. Lucency noted along the right margin of the liver on the current abdomen radiographs is likely due to the right colon adjacent to the liver, accentuated by patient rotation. Pancreas: Unremarkable. No pancreatic ductal dilatation or surrounding inflammatory changes. Spleen: 9 mm low-density lesion in the medial spleen, stable from the prior exam, likely a cyst or hemangioma. Spleen normal in size. No other masses or lesions. Adrenals/Urinary Tract: No adrenal masses. No adrenal masses. Low-density right renal masses consistent with cysts, stable. No left renal masses. Mild left hydronephrosis with dilation of the proximal and mid left ureter. No ureteral stone. Ureter comes in contact with a left pelvic sidewall mass. No right hydronephrosis. Normal right ureter. Bladder is unremarkable. Stomach/Bowel: Stomach is unremarkable. Small bowel and colon are normal in caliber. There are colonic diverticula without  diverticulitis. No small bowel or colon wall thickening or inflammation. Normal appendix visualized. Vascular/Lymphatic: Aortic atherosclerosis extending to its branch vessels. No aneurysm. Stable inferior vena cava filter Left pelvic sidewall masses with associated calcifications, which may reflect locally invasive endometrial carcinoma or metastatic adenopathy. The precaval and right common iliac artery lymphadenopathy noted on the prior study has significantly improved with no residual enlarged lymph nodes. No other evidence of lymphadenopathy. Reproductive: Status post hysterectomy. Left pelvic sidewall mass, which is contiguous with the left iliacus muscle. Mass measures 7.3 x 3.9 x 5.8 cm, measuring 6.1 x 3.6 x 4.64 cm on the prior CT. There are associated calcifications mostly along its inferior margin. No underlying bone resorption. Other: No abdominal wall hernia.  No ascites. Musculoskeletal: No fracture or acute finding. No osteoblastic or osteolytic lesions. IMPRESSION: 1. The lucency along the right margin of the liver noted on the current abdomen radiographs was artifactual, most likely due to the right colon along the anterolateral liver margin, accentuated by patient rotation. 2. Mild left hydronephrosis and hydroureter, new since the prior CT. This appears due to an extrinsic effect by left pelvic sidewall mass, which contacts the distal left ureter. No ureteral stone. 3. Left pelvic sidewall mass, consistent with locally invasive endometrial carcinoma or metastatic adenopathy or a combination, has increased in size from the prior CT. However, the precaval and right common iliac chain adenopathy noted on the prior study has significantly improved. 4. No other evidence of metastatic disease. 5. Other chronic findings, including aortic atherosclerosis, are stable from prior CT. Electronically Signed   By: Lajean Manes M.D.   On: 05/20/2018 16:18   Dg Bone Density  Result Date: 05/12/2018 EXAM:  DUAL X-RAY ABSORPTIOMETRY (DXA) FOR BONE MINERAL DENSITY IMPRESSION: Technologist: SCE PATIENT BIOGRAPHICAL: Name: Hayde, Kilgour Patient ID: [id number] Birth Date: 03-Jul-1930 Height: 60.0 in. Gender: Female Exam Date: 05/12/2018 Weight: 132.5 lbs. Indications: Advanced Age, Caucasian, Height Loss, History of Chemo, History of Endometrial Cancer, History of Radiation, Hypothyroid, Hysterectomy, Oophorectomy Bilateral, Postmenopausal Fractures: Treatments: Levothyroxine ASSESSMENT: The BMD measured at Forearm Radius 33% is 0.534 g/cm2 with a T-score of -3.9. This patient is considered osteoporotic according to [facility]) criteria. The quality of the scan was limited. Lumbar spine was not utilized due to surgrical hardware visible. Site Region Measured Measured WHO Young Adult BMD Date  Age      Classification T-score DualFemur Total Right 05/12/2018 85.8 Osteoporosis -3.0 0.628 g/cm2 Left Forearm Radius 33% 05/12/2018 85.8 Osteoporosis -3.9 0.534 g/cm2 World Health Organization [facility]) criteria for post-menopausal, Caucasian Women: Normal:       T-score at or above -1 SD Osteopenia:   T-score between -1 and -2.5 SD Osteoporosis: T-score at or below -2.5 SD RECOMMENDATIONS: 1. All patients should optimize calcium and vitamin D intake. 2. Consider FDA-approved medical therapies in postmenopausal women and men aged 90 years and older, based on the following: a. A hip or vertebral(clinical or morphometric) fracture b. T-score < -2.5 at the femoral neck or spine after appropriate evaluation to exclude secondary causes c. Low bone mass (T-score between -1.0 and -2.5 at the femoral neck or spine) and a 10-year probability of a hip fracture > 3% or a 10-year probability of a major osteoporosis-related fracture > 20% based on the [place]-adapted WHO algorithm d. Clinician judgment and/or patient preferences may indicate treatment for people with 10-year fracture probabilities above or below these levels  FOLLOW-UP: People with diagnosed cases of osteoporosis or at high risk for fracture should have regular bone mineral density tests. For patients eligible for Medicare, routine testing is allowed once every 2 years. The testing frequency can be increased to one year for patients who have rapidly progressing disease, those who are receiving or discontinuing medical therapy to restore bone mass, or have additional risk factors. I have reviewed this report, and agree with the above findings. [facility] Radiology Electronically Signed   By: Lowella Grip III M.D.   On: 05/12/2018 09:27   Dg Chest Port 1 View  Result Date: 05/30/2018 CLINICAL DATA:  Sepsis.  Ovarian cancer. EXAM: PORTABLE CHEST 1 VIEW COMPARISON:  11/04/2016 FINDINGS: The heart size and mediastinal contours are within normal limits. Both lungs are clear. The visualized skeletal structures are unremarkable. IMPRESSION: No active disease. Electronically Signed   By: Lorriane Shire M.D.   On: 05/30/2018 13:32   Dg Abd 2 Views  Result Date: 05/20/2018 CLINICAL DATA:  LLQ pain getting worse since 1 week ago/ the pain in setting of recurrent endometerial cancerHysterectomy 2 years ago Constipation X days EXAM: ABDOMEN - 2 VIEW COMPARISON:  CT, 01/10/2018 FINDINGS: Normal bowel gas pattern.  No evidence of obstruction. On the initial supine image, there is relative lucency along the right lateral margin of the liver shadow with an apparent irregular contour of the right lateral liver margin. This is of unclear etiology. Stable well-positioned inferior vena cava filter. No evidence of renal or ureteral stones. Clear lung bases.  No acute skeletal abnormality. IMPRESSION: 1. No acute findings.  No evidence of bowel obstruction. 2. Abnormal appearing lucency along the right lateral margin of the liver of unclear etiology. This could potentially be artifact. This could be further assessed with CT. Electronically Signed   By: Lajean Manes M.D.   On:  05/20/2018 12:48   Dg Hip Unilat With Pelvis 2-3 Views Left  Result Date: 06/03/2018 CLINICAL DATA:  Chronic bilateral hip pain. Left side worse than right. EXAM: DG HIP (WITH OR WITHOUT PELVIS) 2-3V LEFT COMPARISON:  CT 05/30/2018. FINDINGS: Diffuse osteopenia. Degenerative changes lumbar spine and both hips. No acute bony abnormality identified. No evidence of fracture dislocation. Aortoiliac atherosclerotic vascular calcification. IMPRESSION: Diffuse osteopenia. Degenerative changes lumbar spine and both hips. No acute bony abnormality. Electronically Signed   By: [name]  Register   On: 06/03/2018 08:56   Dg Hip Unilat With  Pelvis 2-3 Views Right  Result Date: 06/03/2018 CLINICAL DATA:  Chronic RIGHT hip pain. EXAM: DG HIP (WITH OR WITHOUT PELVIS) 2-3V RIGHT COMPARISON:  05/20/2018 radiographs and prior studies FINDINGS: No fracture or dislocation. Mild degenerative change in both hips and LOWER lumbar spine again noted. No suspicious focal bony lesions noted. IMPRESSION: 1. No acute abnormality 2. Mild degenerative changes in both hips and LOWER lumbar spine. Electronically Signed   By: [name] M.D.   On: 06/03/2018 09:00    ASSESSMENT: Endometrial cancer, intractable pain declining performance status.  PLAN:  1.  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  Given patient's declining performance status and multiple medical problems including recent cardiac issues, she is not a candidate for additional chemotherapy at this time.  Appreciate palliative care input. 2.  Pain: Significantly worse with movement.  Appreciate palliative care input.    Continue OxyContin and prn oxycodone.  Also agree with outpatient referral to interventional pain clinic. 3.  History of DVT/PE: Continue Eliquis as prescribed. 4.  Cardiomyopathy/non-STEMI: Appreciate  cardiology input. 5.    Fevers: Unclear etiology.  Appreciate infectious disease input.  TEE negative.  Will follow.   [name], MD   06/03/2018 1:14 PM

## 2018-06-03 NOTE — Progress Notes (Signed)
I requested a wound consult to consider a rotation bed for her.  She has extreme pain in her left leg.  With pain meds it doesn't hurt if she lays on hr right side and holds every still.  She won't can't let us move her.  She's reluctant to even let me attempt to float her heels.  I think she and her skin would benefit from a rotation bed.

## 2018-06-03 NOTE — Progress Notes (Signed)
Progress Note  Patient Name: Vickie Barton Date of Encounter: 06/03/2018  Primary Cardiologist: Ida Rogue, MD   Subjective   Continues to have incredible pain with any movement " I just need [name] and [name]",  Lower extremity venous Doppler ordered to rule out DVT but she cannot tolerate anything touching her legs secondary to severe pain  Was told she might have a " invasive GYN exam" " They are going to have to put me under" secondary to pain  Continues to have fever  Inpatient Medications    Scheduled Meds: . aspirin EC  81 mg Oral Daily  . atorvastatin  10 mg Oral q1800  . calcium-vitamin D  1 tablet Oral Daily  . docusate sodium  100 mg Oral BID  . levothyroxine  150 mcg Oral Q0600  . metoprolol tartrate  12.5 mg Oral BID  . metroNIDAZOLE  500 mg Oral Q8H  . oxyCODONE  15 mg Oral Q8H  . sodium chloride flush  3 mL Intravenous Q12H   Continuous Infusions: . sodium chloride 250 mL (06/03/18 0806)  . ceFEPime (MAXIPIME) IV 2 g (06/03/18 1316)  . heparin 1,000 Units/hr (06/02/18 1711)   PRN Meds: sodium chloride, acetaminophen **OR** acetaminophen, ALPRAZolam, HYDROmorphone (DILAUDID) injection, [DISCONTINUED] ondansetron **OR** ondansetron (ZOFRAN) IV, oxyCODONE, senna, sodium chloride flush   Vital Signs    Vitals:   06/02/18 2200 06/03/18 0434 06/03/18 0747 06/03/18 1331  BP: (!) 100/53 (!) 106/58 (!) 109/55   Pulse: 72 73 69   Resp:   16   Temp:  98.6 F (37 C) 99.2 F (37.3 C) 99.9 F (37.7 C)  TempSrc:  Oral Oral Axillary  SpO2:  98% 97%   Weight:      Height:        Intake/Output Summary (Last 24 hours) at 06/03/2018 1431 Last data filed at 06/03/2018 0658 Gross per 24 hour  Intake 500.99 ml  Output 2150 ml  Net -1649.01 ml   Last 3 Weights 06/01/2018 06/01/2018 05/31/2018  Weight (lbs) 135 lb 140 lb 3.2 oz 135 lb 9.6 oz  Weight (kg) 61.236 kg 63.594 kg 61.508 kg      Telemetry    Normal sinus rhythm- Personally Reviewed  ECG    - Personally Reviewed  Physical Exam   GEN:  No acute distress if laying motionless, severe leg pain with any movement Neck:  Unable to estimate JVD Cardiac: RRR, no murmurs, rubs, or gallops.  Respiratory: Clear to auscultation bilaterally. GI: Soft, nontender, non-distended  MS:  Massive nonpitting very tender lower extremity edema bilaterally Unable to test range of motion of her legs secondary to pain Neuro:  Nonfocal , unable to fully test Psych: Normal affect , anxious  Labs    Chemistry Recent Labs  Lab 05/30/18 1134  06/01/18 0259 06/02/18 0517 06/03/18 0449  NA 136   < > 133* 135 135  K 2.8*   < > 4.1 3.9 3.6  CL 99   < > 108 108 107  CO2 24   < > 19* 20* 20*  GLUCOSE 131*   < > 101* 95 89  BUN 14   < > 14 14 12   CREATININE 0.81   < > 0.73 0.66 0.63  CALCIUM 7.9*   < > 7.8* 7.8* 7.9*  PROT 6.4*  --  5.3*  --   --   ALBUMIN 2.2*  --  1.8*  --   --   AST 47*  --  31  --   --  ALT 20  --  18  --   --   ALKPHOS 73  --  58  --   --   BILITOT 0.7  --  0.5  --   --   GFRNONAA >60   < > >60 >60 >60  GFRAA >60   < > >60 >60 >60  ANIONGAP 13   < > 6 7 8    < > = values in this interval not displayed.     Hematology Recent Labs  Lab 06/01/18 0259 06/02/18 0517 06/03/18 0449  WBC 10.7* 8.8 7.5  RBC 2.79* 2.89* 2.84*  HGB 7.6* 7.8* 7.6*  HCT 24.7* 25.2* 25.4*  MCV 88.5 87.2 89.4  MCH 27.2 27.0 26.8  MCHC 30.8 31.0 29.9*  RDW 14.0 14.0 14.0  PLT 380 367 391    Cardiac Enzymes Recent Labs  Lab 05/30/18 1848 05/31/18 0106 05/31/18 0633  TROPONINI 7.86* 6.36* 5.76*   No results for input(s): TROPIPOC in the last 168 hours.   BNPNo results for input(s): BNP, PROBNP in the last 168 hours.   DDimer No results for input(s): DDIMER in the last 168 hours.   Radiology    Dg Hip Unilat With Pelvis 2-3 Views Left  Result Date: 06/03/2018 CLINICAL DATA:  Chronic bilateral hip pain. Left side worse than right. EXAM: DG HIP (WITH OR WITHOUT PELVIS) 2-3V LEFT  COMPARISON:  CT 05/30/2018. FINDINGS: Diffuse osteopenia. Degenerative changes lumbar spine and both hips. No acute bony abnormality identified. No evidence of fracture dislocation. Aortoiliac atherosclerotic vascular calcification. IMPRESSION: Diffuse osteopenia. Degenerative changes lumbar spine and both hips. No acute bony abnormality. Electronically Signed   By: [name]  Register   On: 06/03/2018 08:56   Dg Hip Unilat With Pelvis 2-3 Views Right  Result Date: 06/03/2018 CLINICAL DATA:  Chronic RIGHT hip pain. EXAM: DG HIP (WITH OR WITHOUT PELVIS) 2-3V RIGHT COMPARISON:  05/20/2018 radiographs and prior studies FINDINGS: No fracture or dislocation. Mild degenerative change in both hips and LOWER lumbar spine again noted. No suspicious focal bony lesions noted. IMPRESSION: 1. No acute abnormality 2. Mild degenerative changes in both hips and LOWER lumbar spine. Electronically Signed   By: [name] M.D.   On: 06/03/2018 09:00    Cardiac Studies   Transesophageal echo this admission TEE performed showing no endocarditis Relatively intact cardiac valves Anterior wall, anteroseptal and apical region hypokinetic/regions of akinesis  Echocardiogram with moderately depressed ejection fraction 35% anterior wall hypokinesis  CT scan abdomen pelvis 6.9 x 4.6 cm mass is noted in left side of pelvis and left iliac fossa consistent with ovarian malignancy or metastatic adenopathy. This is slightly enlarged compared to prior exam.   Patient Profile     85 y.o. 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  Adjuvant treatment was recommended at that time, but patient refused on multiple occations and missed multiple follow-up  completed 6 cycles of carboplatinum and Taxol on April 21, 2017.  completed adjuvant XRT.  Followed by oncology  Anemia -Would  restart Eliquis for DVT prophylaxis  Cardiomyopathy  Prior anterior MI, Q waves anteriorly on EKG indicating likely remote Anterior wall on echo appears scarred out, severely hypokinetic, possible regions of akinesis -No plan for cardiac catheterization or Myoview given inability to lay flat, anemia -On low-dose metoprolol No room to add other medications given hypotension  H/o bilateral PE, LLE DVT --- Could transition back to Eliquis if no plan for invasive procedures   Total encounter time more than 25 minutes  Greater than 50% was spent in counseling and coordination of care with the patient    [facility] will sign off.   Medication Recommendations: No changes Other recommendations (labs, testing, etc): No further testing at this time Follow up as an outpatient: Outpatient cardiology follow-up as tolerated  For questions or updates, please contact [place] Please consult [email or web address] for contact info under        Signed, Ida Rogue, MD  06/03/2018, 2:31 PM

## 2018-06-03 NOTE — Progress Notes (Signed)
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 Patient has had chronic pain from progressive metastatic disease. CT on 05/20/2018 reveals large left pelvic sidewall mass, which had increased in size from previous study and likely is exacerbating pain. Further progression of the mass was noted on repeat CT on 05/30/18.  Patient was admitted to the hospital on 05/30/2018 with sepsis from unclear source.  TTE was concerning for possible endocarditis.  Patient also noted to have cardiomyopathy with EF 35% and anterior wall dysfunction with acute elevation of troponin concerning for non-STEMI.  Palliative care was consulted to help address goals.   CODE STATUS: DNR  PAST MEDICAL HISTORY: Past Medical History:  Diagnosis Date   Arthritis    CHF (congestive heart failure) ([place])    Constipation    DVT (deep venous thrombosis) ([place]) 03/2017   left leg   Endometrial cancer ([place]) 01/28/2016   Hysterectomy, chemo + rad tx's, also internal brachytherapy on 02/2018.    Hyperlipidemia    Hypertension    Hypothyroidism     PAST SURGICAL HISTORY:  Past Surgical History:  Procedure Laterality Date   CYSTOSCOPY W/ RETROGRADES Left 01/01/2017   Procedure: CYSTOSCOPY WITH RETROGRADE PYELOGRAM;  Surgeon: Nickie Retort, MD;  Location: [facility];  Service: Urology;  Laterality: Left;   CYSTOSCOPY W/ RETROGRADES Left 04/23/2017   Procedure: CYSTOSCOPY WITH RETROGRADE PYELOGRAM;  Surgeon: Abbie Sons, MD;  Location: [facility];  Service: Urology;  Laterality: Left;   CYSTOSCOPY W/ URETERAL STENT PLACEMENT Left 04/23/2017   Procedure: CYSTOSCOPY WITH STENT REMOVAL;  Surgeon: Abbie Sons, MD;  Location: [facility];  Service: Urology;  Laterality: Left;   CYSTOSCOPY WITH STENT PLACEMENT Left 01/01/2017   Procedure: CYSTOSCOPY WITH STENT PLACEMENT;  Surgeon: Nickie Retort, MD;  Location: [facility];  Service: Urology;  Laterality: Left;   DILATION AND CURETTAGE OF UTERUS     HYSTEROSCOPY W/D&C N/A 01/28/2016   Procedure: DILATATION AND CURETTAGE /HYSTEROSCOPY;  Surgeon: [name], MD;  Location: [facility];  Service: Gynecology;  Laterality: N/A;   LAPAROSCOPIC BILATERAL SALPINGO OOPHERECTOMY Bilateral 02/26/2016   Procedure: LAPAROSCOPIC BILATERAL SALPINGO OOPHORECTOMY;  Surgeon: [name], MD;  Location: [facility];  Service: Gynecology;  Laterality: Bilateral;   LAPAROSCOPIC HYSTERECTOMY N/A 02/26/2016   Procedure: HYSTERECTOMY TOTAL LAPAROSCOPIC;  Surgeon: [name], MD;  Location: [facility];  Service: Gynecology;  Laterality: N/A;   SENTINEL NODE BIOPSY N/A 02/26/2016   Procedure: SENTINEL NODE BIOPSY;  Surgeon: [name], MD;  Location: [facility];  Service: Gynecology;  Laterality: N/A;   TEE WITHOUT CARDIOVERSION N/A 06/01/2018   Procedure: TRANSESOPHAGEAL ECHOCARDIOGRAM (TEE);  Surgeon: [name], MD;  Location: [facility];  Service: Cardiovascular;  Laterality: N/A;   TONSILLECTOMY      HEMATOLOGY/ONCOLOGY HISTORY:   No history exists.    ALLERGIES:  is allergic to sulfa antibiotics  and zinc.  MEDICATIONS:  Current Facility-Administered Medications  Medication Dose Route Frequency Provider  Last Rate Last Dose   0.9 %  sodium chloride infusion   Intravenous PRN Stark Jock, Jude, MD 10 mL/hr at 06/03/18 0806 250 mL at 06/03/18 2353   acetaminophen (TYLENOL) tablet 650 mg  650 mg Oral Q6H PRN Saundra Shelling, MD   650 mg at 06/02/18 0550   Or   acetaminophen (TYLENOL) suppository 650 mg  650 mg Rectal Q6H PRN Saundra Shelling, MD       ALPRAZolam Duanne Moron) tablet 0.25 mg  0.25 mg Oral BID PRN Stark Jock, Jude, MD   0.25 mg at 06/03/18 1131   aspirin EC tablet 81 mg  81 mg Oral Daily [name], MD   81 mg at 06/03/18 6144   atorvastatin (LIPITOR) tablet 10 mg  10 mg Oral q1800 [name], MD   10 mg at 06/02/18 1712   calcium-vitamin D (OSCAL WITH D) 500-200 MG-UNIT per tablet 1 tablet  1 tablet Oral Daily Pyreddy, Reatha Harps, MD   1 tablet at 06/03/18 0811   ceFEPIme (MAXIPIME) 2 g in sodium chloride 0.9 % 100 mL IVPB  2 g Intravenous Q12H Lu Duffel, RPH 200 mL/hr at 06/03/18 1316 2 g at 06/03/18 1316   docusate sodium (COLACE) capsule 100 mg  100 mg Oral BID Stark Jock, Jude, MD   100 mg at 06/03/18 3154   heparin ADULT infusion 100 units/mL (25000 units/242mL sodium chloride 0.45%)  1,000 Units/hr Intravenous Continuous Ojie, Jude, MD 10 mL/hr at 06/02/18 1711 1,000 Units/hr at 06/02/18 1711   levothyroxine (SYNTHROID, LEVOTHROID) tablet 150 mcg  150 mcg Oral Q0600 Darel Hong D, NP   150 mcg at 06/03/18 0449   metoprolol tartrate (LOPRESSOR) tablet 12.5 mg  12.5 mg Oral BID [name], MD   12.5 mg at 06/03/18 0086   metroNIDAZOLE (FLAGYL) IVPB 500 mg  500 mg Intravenous Isabel Caprice, MD   Stopped at 06/03/18 1300   morphine 2 MG/ML injection 2 mg  2 mg Intravenous Q2H PRN Flora Lipps, MD       ondansetron (ZOFRAN) injection 4 mg  4 mg Intravenous Q6H PRN Pyreddy, Reatha Harps, MD       oxyCODONE (Oxy IR/ROXICODONE) immediate release tablet 15 mg  15 mg Oral Q3H PRN Guenevere Roorda, [name], NP       oxyCODONE (OXYCONTIN) 12 hr tablet 15 mg  15 mg Oral Q8H  Giliana Vantil R, NP       sodium chloride flush (NS) 0.9 % injection 3 mL  3 mL Intravenous Q12H Ojie, Jude, MD   3 mL at 06/03/18 0814   sodium chloride flush (NS) 0.9 % injection 3 mL  3 mL Intravenous PRN Ojie, Jude, MD        VITAL SIGNS: BP (!) 109/55    Pulse 69    Temp 99.2 F (37.3 C) (Oral)    Resp 16    Ht 5\' 2"  (1.575 m)    Wt 135 lb (61.2 kg)    SpO2 97%    BMI 24.69 kg/m  Filed Weights   05/31/18 1737 06/01/18 0304 06/01/18 0918  Weight: 135 lb 9.6 oz (61.5 kg) 140 lb 3.2 oz (63.6 kg) 135 lb (61.2 kg)    Estimated body mass index is 24.69 kg/m as calculated from the following:   Height as of this encounter: 5\' 2"  (1.575 m).   Weight as of this encounter: 135 lb (61.2 kg).  LABS: CBC:    Component Value Date/Time   WBC 7.5 06/03/2018 0449   HGB 7.6 (L) 06/03/2018 0449   HCT 25.4 (L) 06/03/2018 0449   PLT 391 06/03/2018 0449   MCV 89.4 06/03/2018 0449   NEUTROABS 9.6 (H) 05/26/2018 1544   LYMPHSABS 0.8 05/26/2018 1544   MONOABS 0.7 05/26/2018 1544   EOSABS 0.0 05/26/2018 1544   BASOSABS 0.0 05/26/2018 1544   Comprehensive Metabolic Panel:    Component Value Date/Time   NA 135 06/03/2018 0449   K 3.6 06/03/2018 0449   CL 107 06/03/2018 0449   CO2 20 (L) 06/03/2018 0449   BUN 12 06/03/2018 0449   CREATININE 0.63 06/03/2018 0449   GLUCOSE 89 06/03/2018 0449   CALCIUM 7.9 (L) 06/03/2018 0449   AST 31 06/01/2018 0259   ALT 18 06/01/2018 0259   ALKPHOS 58 06/01/2018 0259   BILITOT 0.5 06/01/2018 0259   PROT 5.3 (L) 06/01/2018 0259   ALBUMIN 1.8 (L) 06/01/2018 0259    RADIOGRAPHIC STUDIES: Ct Abdomen Pelvis W Contrast  Result Date: 05/30/2018 CLINICAL DATA:  Acute generalized abdominal pain. EXAM: CT ABDOMEN AND PELVIS WITH CONTRAST TECHNIQUE: Multidetector CT imaging of the abdomen and pelvis was performed using the standard protocol following bolus administration of intravenous contrast. CONTRAST:  51mL OMNIPAQUE IOHEXOL 300 MG/ML  SOLN COMPARISON:  CT  scan of May 20, 2018. FINDINGS: Lower chest: No acute abnormality. Hepatobiliary: No focal liver abnormality is seen. No gallstones, gallbladder wall thickening, or biliary dilatation. Pancreas: Unremarkable. No pancreatic ductal dilatation or surrounding inflammatory changes. Spleen: Stable lucency seen in the spleen most consistent with hemangioma or cyst. No other splenic abnormality seen. Adrenals/Urinary Tract: Adrenal glands appear normal. Stable right renal cysts are noted. Minimal left hydronephrosis is noted but there is no evidence of obstructing calculus. No definite evidence of ureteral compression is noted. Urinary bladder is unremarkable. Stomach/Bowel: The stomach appears normal. There is no evidence of bowel obstruction or inflammation. The appendix appears unremarkable. Vascular/Lymphatic: Atherosclerosis of abdominal aorta is noted without aneurysm formation. Reproductive: Status post hysterectomy and bilateral oophorectomy. 6.9 x 4.6 cm mass is noted in left side of pelvis and left iliac fossa consistent with ovarian malignancy or metastatic adenopathy. This is slightly enlarged compared to prior exam. Other: No abdominal wall hernia or abnormality. No abdominopelvic ascites. Musculoskeletal: No acute or significant osseous findings. IMPRESSION: 6.9 x 4.6 cm mass is noted in left pelvic sidewall and left iliac fossa consistent with ovarian malignancy or metastatic adenopathy. This is slightly enlarged compared to prior exam. Minimal left hydronephrosis is noted which is slightly improved compared to prior exam. No ureteral calculus or definite evidence of external compression is noted. Aortic Atherosclerosis (ICD10-I70.0). Electronically Signed   By: Marijo Conception, M.D.   On: 05/30/2018 14:46   Ct Abdomen Pelvis W Contrast  Result Date: 05/20/2018 CLINICAL DATA:  Pt c/o severe Left groin pain radiating down her Left leg x 2 weeks. NKI Hx Endometrial CA diagnosed in 01/2017 and she had  total hysterectomy, chemo + rad tx's. Also had internal brachytherapy on 02/2018. EXAM: CT ABDOMEN AND PELVIS WITH CONTRAST TECHNIQUE: Multidetector CT imaging of the abdomen and pelvis was performed using the standard protocol following bolus administration of intravenous contrast. CONTRAST:  44mL OMNIPAQUE IOHEXOL 300 MG/ML  SOLN COMPARISON:  Current abdomen radiographs. PET-CT, 02/04/2018. Abdomen and pelvis CT, 01/10/2018. FINDINGS: Lower chest: No acute abnormality. Hepatobiliary: Normal liver. Gallbladder is distended but otherwise unremarkable. No bile duct dilation. Lucency noted  along the right margin of the liver on the current abdomen radiographs is likely due to the right colon adjacent to the liver, accentuated by patient rotation. Pancreas: Unremarkable. No pancreatic ductal dilatation or surrounding inflammatory changes. Spleen: 9 mm low-density lesion in the medial spleen, stable from the prior exam, likely a cyst or hemangioma. Spleen normal in size. No other masses or lesions. Adrenals/Urinary Tract: No adrenal masses. No adrenal masses. Low-density right renal masses consistent with cysts, stable. No left renal masses. Mild left hydronephrosis with dilation of the proximal and mid left ureter. No ureteral stone. Ureter comes in contact with a left pelvic sidewall mass. No right hydronephrosis. Normal right ureter. Bladder is unremarkable. Stomach/Bowel: Stomach is unremarkable. Small bowel and colon are normal in caliber. There are colonic diverticula without diverticulitis. No small bowel or colon wall thickening or inflammation. Normal appendix visualized. Vascular/Lymphatic: Aortic atherosclerosis extending to its branch vessels. No aneurysm. Stable inferior vena cava filter Left pelvic sidewall masses with associated calcifications, which may reflect locally invasive endometrial carcinoma or metastatic adenopathy. The precaval and right common iliac artery lymphadenopathy noted on the prior  study has significantly improved with no residual enlarged lymph nodes. No other evidence of lymphadenopathy. Reproductive: Status post hysterectomy. Left pelvic sidewall mass, which is contiguous with the left iliacus muscle. Mass measures 7.3 x 3.9 x 5.8 cm, measuring 6.1 x 3.6 x 4.64 cm on the prior CT. There are associated calcifications mostly along its inferior margin. No underlying bone resorption. Other: No abdominal wall hernia.  No ascites. Musculoskeletal: No fracture or acute finding. No osteoblastic or osteolytic lesions. IMPRESSION: 1. The lucency along the right margin of the liver noted on the current abdomen radiographs was artifactual, most likely due to the right colon along the anterolateral liver margin, accentuated by patient rotation. 2. Mild left hydronephrosis and hydroureter, new since the prior CT. This appears due to an extrinsic effect by left pelvic sidewall mass, which contacts the distal left ureter. No ureteral stone. 3. Left pelvic sidewall mass, consistent with locally invasive endometrial carcinoma or metastatic adenopathy or a combination, has increased in size from the prior CT. However, the precaval and right common iliac chain adenopathy noted on the prior study has significantly improved. 4. No other evidence of metastatic disease. 5. Other chronic findings, including aortic atherosclerosis, are stable from prior CT. Electronically Signed   By: Lajean Manes M.D.   On: 05/20/2018 16:18   Dg Bone Density  Result Date: 05/12/2018 EXAM: DUAL X-RAY ABSORPTIOMETRY (DXA) FOR BONE MINERAL DENSITY IMPRESSION: Technologist: SCE PATIENT BIOGRAPHICAL: Name: Vickie Barton, Vickie Barton Patient ID: [id number] Birth Date: 1930/11/08 Height: 60.0 in. Gender: Female Exam Date: 05/12/2018 Weight: 132.5 lbs. Indications: Advanced Age, Caucasian, Height Loss, History of Chemo, History of Endometrial Cancer, History of Radiation, Hypothyroid, Hysterectomy, Oophorectomy Bilateral, Postmenopausal  Fractures: Treatments: Levothyroxine ASSESSMENT: The BMD measured at Forearm Radius 33% is 0.534 g/cm2 with a T-score of -3.9. This patient is considered osteoporotic according to [facility]) criteria. The quality of the scan was limited. Lumbar spine was not utilized due to surgrical hardware visible. Site Region Measured Measured WHO Young Adult BMD Date       Age      Classification T-score DualFemur Total Right 05/12/2018 85.8 Osteoporosis -3.0 0.628 g/cm2 Left Forearm Radius 33% 05/12/2018 85.8 Osteoporosis -3.9 0.534 g/cm2 World Health Organization [facility]) criteria for post-menopausal, Caucasian Women: Normal:       T-score at or above -1 SD Osteopenia:   T-score  between -1 and -2.5 SD Osteoporosis: T-score at or below -2.5 SD RECOMMENDATIONS: 1. All patients should optimize calcium and vitamin D intake. 2. Consider FDA-approved medical therapies in postmenopausal women and men aged 33 years and older, based on the following: a. A hip or vertebral(clinical or morphometric) fracture b. T-score < -2.5 at the femoral neck or spine after appropriate evaluation to exclude secondary causes c. Low bone mass (T-score between -1.0 and -2.5 at the femoral neck or spine) and a 10-year probability of a hip fracture > 3% or a 10-year probability of a major osteoporosis-related fracture > 20% based on the [place]-adapted WHO algorithm d. Clinician judgment and/or patient preferences may indicate treatment for people with 10-year fracture probabilities above or below these levels FOLLOW-UP: People with diagnosed cases of osteoporosis or at high risk for fracture should have regular bone mineral density tests. For patients eligible for Medicare, routine testing is allowed once every 2 years. The testing frequency can be increased to one year for patients who have rapidly progressing disease, those who are receiving or discontinuing medical therapy to restore bone mass, or have additional risk factors. I have  reviewed this report, and agree with the above findings. [facility] Radiology Electronically Signed   By: Lowella Grip III M.D.   On: 05/12/2018 09:27   Dg Chest Port 1 View  Result Date: 05/30/2018 CLINICAL DATA:  Sepsis.  Ovarian cancer. EXAM: PORTABLE CHEST 1 VIEW COMPARISON:  11/04/2016 FINDINGS: The heart size and mediastinal contours are within normal limits. Both lungs are clear. The visualized skeletal structures are unremarkable. IMPRESSION: No active disease. Electronically Signed   By: Lorriane Shire M.D.   On: 05/30/2018 13:32   Dg Abd 2 Views  Result Date: 05/20/2018 CLINICAL DATA:  LLQ pain getting worse since 1 week ago/ the pain in setting of recurrent endometerial cancerHysterectomy 2 years ago Constipation X days EXAM: ABDOMEN - 2 VIEW COMPARISON:  CT, 01/10/2018 FINDINGS: Normal bowel gas pattern.  No evidence of obstruction. On the initial supine image, there is relative lucency along the right lateral margin of the liver shadow with an apparent irregular contour of the right lateral liver margin. This is of unclear etiology. Stable well-positioned inferior vena cava filter. No evidence of renal or ureteral stones. Clear lung bases.  No acute skeletal abnormality. IMPRESSION: 1. No acute findings.  No evidence of bowel obstruction. 2. Abnormal appearing lucency along the right lateral margin of the liver of unclear etiology. This could potentially be artifact. This could be further assessed with CT. Electronically Signed   By: Lajean Manes M.D.   On: 05/20/2018 12:48   Dg Hip Unilat With Pelvis 2-3 Views Left  Result Date: 06/03/2018 CLINICAL DATA:  Chronic bilateral hip pain. Left side worse than right. EXAM: DG HIP (WITH OR WITHOUT PELVIS) 2-3V LEFT COMPARISON:  CT 05/30/2018. FINDINGS: Diffuse osteopenia. Degenerative changes lumbar spine and both hips. No acute bony abnormality identified. No evidence of fracture dislocation. Aortoiliac atherosclerotic vascular calcification.  IMPRESSION: Diffuse osteopenia. Degenerative changes lumbar spine and both hips. No acute bony abnormality. Electronically Signed   By: [name]  Register   On: 06/03/2018 08:56   Dg Hip Unilat With Pelvis 2-3 Views Right  Result Date: 06/03/2018 CLINICAL DATA:  Chronic RIGHT hip pain. EXAM: DG HIP (WITH OR WITHOUT PELVIS) 2-3V RIGHT COMPARISON:  05/20/2018 radiographs and prior studies FINDINGS: No fracture or dislocation. Mild degenerative change in both hips and LOWER lumbar spine again noted. No suspicious focal bony lesions  noted. IMPRESSION: 1. No acute abnormality 2. Mild degenerative changes in both hips and LOWER lumbar spine. Electronically Signed   By: [name] M.D.   On: 06/03/2018 09:00    PERFORMANCE STATUS (ECOG) : 4 - Bedbound  REVIEW OF SYSTEMS: As noted above. Otherwise, a complete review of systems is negative.  PHYSICAL EXAM: General: NAD, frail appearing, thin Cardiovascular: regular rate and rhythm Pulmonary: clear ant fields Abdomen: soft, nontender, + bowel sounds GU: no suprapubic tenderness Extremities: edema BLE Skin: no rashes Neurological: Weakness but otherwise nonfocal   IMPRESSION: Follow-up visit made with patient and daughter.  Patient says pain has been reasonably controlled with rest but it is excruciating with any movement.  Imaging of the hip reviewed and reveals degenerative changes but without significant cause of pain.  Pain remains most likely secondary to pelvic mass.  GYN consult pending for exam.  Case discussed with Dr. Grayland Ormond and Dr. Stark Jock.  We will increase OxyContin to 15 mg every 8 hours and increase OxyIR to 15 mg every 3 hours for breakthrough pain.  In past 24 hours patient has received a total of 50 mg of immediate release oxycodone and 30 mg of OxyContin.  Total oral MME approximately 120 mg in past 24 hours.  Patient has received IV morphine but does not feel that that helps.  Will discontinue and order IV hydromorphone to be used  for BTP associated with movement given rapid onset versus oral analgesia.  Patient continues to endorse a desire to pursue rehab.  I suspect that her rehab potential is poor and I talked with patient about the possibility that she may be at a new functional baseline.  Patient remains committed to trying rehab to see what if she can improve functioning.  She does not want to be a burden to her daughter when she returns home.  We then talked about the option of hospice involvement at home.  PLAN: -Continue current scope of treatment -Increase OxyContin to 15 mg every 8 hours -Increase oxycodone IR to 15 mg every 3 hours PRN for breakthrough pain -Discontinue IV morphine -As IV hydromorphone 0.5 mg every 4 hours as needed for BTP associated with movement -Add prophylactic bowel regimen -Anticipate rehab with palliative care following and likely will need hospice when she returns home   Time Total: 30 minutes  Visit consisted of counseling and education dealing with the complex and emotionally intense issues of symptom management and palliative care in the setting of serious and potentially life-threatening illness.Greater than 50%  of this time was spent counseling and coordinating care related to the above assessment and plan.  Signed by: Altha Harm, PhD, NP-C [phone] (Work Cell)

## 2018-06-03 NOTE — Plan of Care (Signed)
  Problem: Clinical Measurements: Goal: Will remain free from infection Outcome: Progressing   Problem: Pain Managment: Goal: General experience of comfort will improve Outcome: Progressing   Problem: Safety: Goal: Ability to remain free from injury will improve Outcome: Progressing   

## 2018-06-03 NOTE — Progress Notes (Signed)
Date of Admission:  05/30/2018     ID: Vickie Barton is a 85 y.o. female  Metastatic endometrial cancer    Subjective: Says today is having less pain  Medications:  . apixaban  5 mg Oral BID  . aspirin EC  81 mg Oral Daily  . atorvastatin  10 mg Oral q1800  . calcium-vitamin D  1 tablet Oral Daily  . docusate sodium  100 mg Oral BID  . levothyroxine  150 mcg Oral Q0600  . metoprolol tartrate  12.5 mg Oral BID  . metroNIDAZOLE  500 mg Oral Q8H  . oxyCODONE  15 mg Oral Q8H  . sodium chloride flush  3 mL Intravenous Q12H    Objective: Vital signs in last 24 hours: Temp:  [98.3 F (36.8 C)-99.9 F (37.7 C)] 99.9 F (37.7 C) (03/13 1331) Pulse Rate:  [69-80] 69 (03/13 0747) Resp:  [16-20] 16 (03/13 0747) BP: (93-109)/(49-58) 109/55 (03/13 0747) SpO2:  [97 %-99 %] 97 % (03/13 0747)  PHYSICAL EXAM:  General: Alert, cooperative, no distress, appears Lungs: Clear to auscultation bilaterally. No Wheezing or Rhonchi. No rales. Heart: Regular rate and rhythm, no murmur, rub or gallop. Abdomen: Soft, tenderness lower abdomen Extremities: left leg swollen Neurologic: Grossly non-focal  Lab Results Recent Labs    06/02/18 0517 06/03/18 0449  WBC 8.8 7.5  HGB 7.8* 7.6*  HCT 25.2* 25.4*  NA 135 135  K 3.9 3.6  CL 108 107  CO2 20* 20*  BUN 14 12  CREATININE 0.66 0.63   Liver Panel Recent Labs    06/01/18 0259  PROT 5.3*  ALBUMIN 1.8*  AST 31  ALT 18  ALKPHOS 58  BILITOT 0.5   Sedimentation Rate No results for input(s): ESRSEDRATE in the last 72 hours. C-Reactive Protein No results for input(s): CRP in the last 72 hours.  Microbiology:  Studies/Results: Dg Hip Unilat With Pelvis 2-3 Views Left  Result Date: 06/03/2018 CLINICAL DATA:  Chronic bilateral hip pain. Left side worse than right. EXAM: DG HIP (WITH OR WITHOUT PELVIS) 2-3V LEFT COMPARISON:  CT 05/30/2018. FINDINGS: Diffuse osteopenia. Degenerative changes lumbar spine and both hips. No acute  bony abnormality identified. No evidence of fracture dislocation. Aortoiliac atherosclerotic vascular calcification. IMPRESSION: Diffuse osteopenia. Degenerative changes lumbar spine and both hips. No acute bony abnormality. Electronically Signed   By: [name]  Register   On: 06/03/2018 08:56   Dg Hip Unilat With Pelvis 2-3 Views Right  Result Date: 06/03/2018 CLINICAL DATA:  Chronic RIGHT hip pain. EXAM: DG HIP (WITH OR WITHOUT PELVIS) 2-3V RIGHT COMPARISON:  05/20/2018 radiographs and prior studies FINDINGS: No fracture or dislocation. Mild degenerative change in both hips and LOWER lumbar spine again noted. No suspicious focal bony lesions noted. IMPRESSION: 1. No acute abnormality 2. Mild degenerative changes in both hips and LOWER lumbar spine. Electronically Signed   By: [name] M.D.   On: 06/03/2018 09:00     Assessment/Plan: Fever in a patient with metastatic endometrial carcinoma who had TAH/BSO and now has pelvic lymphadenopathy on the left pelvic side wall  Fever unclear but -likely related to the malignancy complication Concern is whether she has bone erosion ( none as pelvic xray okay), concern for vaginal extension VS vaginal infection ( no discharge) concern for rectal erosion?? Concern For severe DVT  (worsening) causing fever,r/o septic thrombophlebitis Recommend Pelvic examination( she wants it under sedation) Trans vaginal/pelvic  Ultrasound Doppler leg--do doppler or vascular studies of femoral/iliac/veins She has IVC filter (  since 2018) ?? 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 ? Discussed with patient , her  daughter and care team

## 2018-06-04 LAB — BASIC METABOLIC PANEL
Anion gap: 8 (ref 5–15)
BUN: 12 mg/dL (ref 8–23)
CO2: 21 mmol/L — AB (ref 22–32)
Calcium: 7.8 mg/dL — ABNORMAL LOW (ref 8.9–10.3)
Chloride: 107 mmol/L (ref 98–111)
Creatinine, Ser: 0.54 mg/dL (ref 0.44–1.00)
GFR calc Af Amer: >60 mL/min (ref >60)
GFR calc non Af Amer: >60 mL/min (ref >60)
Glucose, Bld: 90 mg/dL (ref 70–99)
Potassium: 3.3 mmol/L — ABNORMAL LOW (ref 3.5–5.1)
Sodium: 136 mmol/L (ref 135–145)

## 2018-06-04 LAB — CULTURE, BLOOD (ROUTINE X 2)
Culture: NO GROWTH
Culture: NO GROWTH
Special Requests: ADEQUATE

## 2018-06-04 LAB — CBC
HCT: 25.9 % — ABNORMAL LOW (ref 36.0–46.0)
Hemoglobin: 7.8 g/dL — ABNORMAL LOW (ref 12.0–15.0)
MCH: 27.4 pg (ref 26.0–34.0)
MCHC: 30.1 g/dL (ref 30.0–36.0)
MCV: 90.9 fL (ref 80.0–100.0)
Platelets: 417 10*3/uL — ABNORMAL HIGH (ref 150–400)
RBC: 2.85 MIL/uL — ABNORMAL LOW (ref 3.87–5.11)
RDW: 14.2 % (ref 11.5–15.5)
WBC: 11.8 10*3/uL — ABNORMAL HIGH (ref 4.0–10.5)
nRBC: 0 % (ref 0.0–0.2)

## 2018-06-04 LAB — PHOSPHORUS: Phosphorus: 2.4 mg/dL — ABNORMAL LOW (ref 2.5–4.6)

## 2018-06-04 LAB — MAGNESIUM: Magnesium: 1.9 mg/dL (ref 1.7–2.4)

## 2018-06-04 MED ORDER — POTASSIUM CHLORIDE CRYS ER 20 MEQ PO TBCR
20.0000 meq | EXTENDED_RELEASE_TABLET | Freq: Two times a day (BID) | ORAL | Status: DC
  Administered 2018-06-04 – 2018-06-05 (×3): 20 meq via ORAL
  Filled 2018-06-04 (×3): qty 1

## 2018-06-04 NOTE — Progress Notes (Signed)
[place] at [place] NAME: Vickie Barton    MR#:  [id number]  DATE OF BIRTH:  March 04, 1931  SUBJECTIVE:  CHIEF COMPLAINT:   Chief Complaint  Patient presents with  . Fever  . Weakness  . Nausea  . Emesis   No fevers.  Patient still having significant lower extremity pain when moved around in bed.   On long-acting oxycodone and short release oxycodone for pain control.  REVIEW OF SYSTEMS:  Review of Systems  Constitutional: Negative for chills and fever.  HENT: Negative for hearing loss and tinnitus.   Eyes: Negative for blurred vision and double vision.  Respiratory: Negative for cough and hemoptysis.   Cardiovascular: Negative for chest pain and palpitations.  Gastrointestinal: Negative for heartburn, nausea and vomiting.  Genitourinary: Negative for dysuria and urgency.  Musculoskeletal: Negative for myalgias and neck pain.  Skin: Negative for itching and rash.  Neurological: Negative for dizziness and headaches.  Psychiatric/Behavioral: Negative for depression and hallucinations.      DRUG ALLERGIES:   Allergies  Allergen Reactions  . Sulfa Antibiotics Rash  . Zinc Rash   VITALS:  Blood pressure (!) 114/58, pulse 71, temperature 98.7 F (37.1 C), temperature source Oral, resp. rate 18, height 5\' 2"  (1.575 m), weight 61.2 kg, SpO2 97 %. PHYSICAL EXAMINATION:  Physical Exam  GENERAL:  85 y.o.-year-old patient lying in the bed with no acute distress.  EYES: Pupils equal, round, reactive to light and accommodation. No scleral icterus. Extraocular muscles intact.  HEENT: Head atraumatic, normocephalic. Oropharynx and nasopharynx clear.  NECK:  Supple, no jugular venous distention. No thyroid enlargement, no tenderness.  LUNGS: Normal breath sounds bilaterally, no wheezing, rales,rhonchi or crepitation. No use of accessory muscles of respiration.  CARDIOVASCULAR: S1, S2 normal. No murmurs, rubs, or gallops.  ABDOMEN:  Soft, No tenderness , nondistended. Bowel sounds present. No organomegaly or mass.  EXTREMITIES: No pedal edema, cyanosis, or clubbing.  Significant pain /tenderness with movement of lower extremity  NEUROLOGIC: Cranial nerves II through XII are intact. Muscle strength 4/5 in all extremities. Sensation intact. Gait not checked.  She does not want to move much in the bed due to significant pain. PSYCHIATRIC: The patient is alert and oriented x 3.  SKIN: No obvious rash, lesion, or ulcer.  LABORATORY PANEL:  Female CBC Recent Labs  Lab 06/04/18 0612  WBC 11.8*  HGB 7.8*  HCT 25.9*  PLT 417*   ------------------------------------------------------------------------------------------------------------------ Chemistries  Recent Labs  Lab 06/01/18 0259  06/04/18 0612  NA 133*   < > 136  K 4.1   < > 3.3*  CL 108   < > 107  CO2 19*   < > 21*  GLUCOSE 101*   < > 90  BUN 14   < > 12  CREATININE 0.73   < > 0.54  CALCIUM 7.8*   < > 7.8*  MG  --    < > 1.9  AST 31  --   --   ALT 18  --   --   ALKPHOS 58  --   --   BILITOT 0.5  --   --    < > = values in this interval not displayed.   RADIOLOGY:  No results found. ASSESSMENT AND PLAN:   85 year old female patient with a known history of DVT in the left leg, endometrial cancer, status post chemotherapy and radiation therapy, hypertension, hypothyroidism, chronic congestive heart failure presented to the emergency room  for generalized weakness, nausea and vomiting and diarrhea.  Diagnosed with sepsis.  Etiology not yet determined  1. Sepsis Unknown etiology.  Patient was initially managed in the ICU and subsequently transferred to the medical floor.  Patient was initially placed on broad-spectrum IV antibiotics with vancomycin and cefepime . Patient noted to have fevers with temperature of 101.2 two nights ago.  No fevers however in the last 24 hours. So far no evidence of infectious process found.  Influenza test negative.  TEE done 2  days ago with no vegetation. Seen by infectious disease specialist.  Appreciate input.  Fevers likely related to underlying malignancy.  Due to concern for possible bone erosion or concern for vaginal extension, recommended transvaginal ultrasound and bilateral lower extremity venous Doppler ultrasound to rule out new DVT.  Due to severity of lower extremity pains, unable to move patient around for any further studies unless patient has conscious sedation prior to imaging.  Patient however not keen on any further imaging since this would not necessarily change management.  Patient already on anticoagulation for DVT.  Patient also not a candidate for any chemotherapy for underlying malignancy as confirmed by oncologist.   2.  Non-ST MI elevation Patient currently on heparin drip.  Denies any chest pain. 2D echocardiogram done with evidence of ischemic cardiomyopathy with ejection fraction of 30 to 35%.  Unable to exclude underlying ischemia. Patient had TEE done due to concern to rule out endocarditis..  No valvular vegetation noted as such endocarditis ruled out.  Ejection fraction of 30 to 35% noted with severe hypokinesis of the left ventricle. Cardiologist confirmed that patient is not a candidate for cardiac catheterization at this time due to anemia and unable to reposition patient secondary to severe pains..  Continue medical management.  Continue aspirin and beta-blocker.  Add statins.  3.  Chronic systolic CHF Patient with heart failure with reduced ejection fraction.  Stable.  4.  Mild hyperkalemia Resolved  5.Acute diarrhea self-limiting; appears resolved C. difficile negative.  6.Endometrial and ovarian malignancy Patient seen by oncologist.  Given declining performance status and multiple medical problems including cardiac issues, patient currently not a candidate for additional chemotherapy.  If performance status improves, they will reconsider in the future Oncology follow-up   7. History of DVT left leg Patient was on Eliquis prior to admission which was initially placed on hold due to patient being placed on heparin drip for management of non-ST MI elevation.  Heparin drip discontinued , transition to home dose of Eliquis  8.  Anemia Etiology not clear.  Noted drop in hemoglobin to 7.6 yesterday.  Hemoglobin stable at 7.6 today.. Clinically no evidence of bleeding.  Iron studies requested. Follow-up CBC in a.m.  9.  Lower extremity pains Patient does have significant pains when moved around.  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 Seen by palliative care team again today for pain management.  Patient currently not does not want involvement of hospice.  Wishes to go to rehab on discharge.  Increased dose of pain meds including adding PRN IV Dilaudid. I discussed the option of nerve block with interventional radiologist Dr. Annamaria Boots who stated this is usually done by anesthesiology/pain management.  I have texted anesthesiologist.  DVT prophylaxis; patient now being started back on Eliquis Disposition; case manager working on rehab placement-need better pain control before that.  All the records are reviewed and case discussed with Care Management/Social Worker. Management plans discussed with the patient, family and  they are in agreement.  CODE STATUS: DNR  TOTAL TIME TAKING CARE OF THIS PATIENT: 31 minutes.   More than 50% of the time was spent in counseling/coordination of care: YES  POSSIBLE D/C IN 2 DAYS, DEPENDING ON CLINICAL CONDITION.   Vaughan Basta M.D on 06/04/2018 at 3:47 PM  Between 7am to 6pm - Pager - [phone]  After 6pm go to [email or web address] - Proofreader  [facility]  Office  [phone]  CC: Primary care physician; [name], MD  Note: This dictation was prepared with Dragon dictation along with smaller phrase technology. Any  transcriptional errors that result from this process are unintentional.

## 2018-06-04 NOTE — Progress Notes (Signed)
PT Cancellation Note  Patient Details Name: Vickie Barton MRN: [id number] DOB: July 13, 1930   Cancelled Treatment:    Reason Eval/Treat Not Completed: Medical issues which prohibited therapy   Chart reviewed and discussed with primary RN.  Due to Infectious Disease notes and discussion with RN, will hold session and continue as appropriate.   Chesley Noon 06/04/2018, 10:06 AM

## 2018-06-04 NOTE — Plan of Care (Signed)
  Problem: Education: Goal: Knowledge of General Education information will improve Description: Including pain rating scale, medication(s)/side effects and non-pharmacologic comfort measures Outcome: Progressing   Problem: Pain Managment: Goal: General experience of comfort will improve Outcome: Progressing   Problem: Safety: Goal: Ability to remain free from injury will improve Outcome: Progressing   

## 2018-06-05 LAB — BASIC METABOLIC PANEL
Anion gap: 6 (ref 5–15)
BUN: 9 mg/dL (ref 8–23)
CO2: 25 mmol/L (ref 22–32)
Calcium: 7.6 mg/dL — ABNORMAL LOW (ref 8.9–10.3)
Chloride: 103 mmol/L (ref 98–111)
Creatinine, Ser: 0.65 mg/dL (ref 0.44–1.00)
GFR calc Af Amer: >60 mL/min (ref >60)
GFR calc non Af Amer: >60 mL/min (ref >60)
Glucose, Bld: 95 mg/dL (ref 70–99)
Potassium: 4.4 mmol/L (ref 3.5–5.1)
Sodium: 134 mmol/L — ABNORMAL LOW (ref 135–145)

## 2018-06-05 MED ORDER — OXYCODONE HCL ER 20 MG PO T12A
20.0000 mg | EXTENDED_RELEASE_TABLET | Freq: Three times a day (TID) | ORAL | Status: DC
  Administered 2018-06-05 – 2018-06-07 (×6): 20 mg via ORAL
  Filled 2018-06-05 (×6): qty 1

## 2018-06-05 NOTE — Plan of Care (Signed)
  Problem: Education: Goal: Knowledge of General Education information will improve Description: Including pain rating scale, medication(s)/side effects and non-pharmacologic comfort measures Outcome: Progressing   Problem: Pain Managment: Goal: General experience of comfort will improve Outcome: Progressing   

## 2018-06-05 NOTE — Progress Notes (Signed)
[place] at [place] NAME: Vickie Barton    MR#:  [id number]  DATE OF BIRTH:  06/08/1930  SUBJECTIVE:  CHIEF COMPLAINT:   Chief Complaint  Patient presents with  . Fever  . Weakness  . Nausea  . Emesis   No fevers.  Patient still having significant lower extremity pain when moved around in bed.   On long-acting oxycodone and short release oxycodone for pain control.  REVIEW OF SYSTEMS:  Review of Systems  Constitutional: Negative for chills and fever.  HENT: Negative for hearing loss and tinnitus.   Eyes: Negative for blurred vision and double vision.  Respiratory: Negative for cough and hemoptysis.   Cardiovascular: Negative for chest pain and palpitations.  Gastrointestinal: Negative for heartburn, nausea and vomiting.  Genitourinary: Negative for dysuria and urgency.  Musculoskeletal: Negative for myalgias and neck pain.  Skin: Negative for itching and rash.  Neurological: Negative for dizziness and headaches.  Psychiatric/Behavioral: Negative for depression and hallucinations.      DRUG ALLERGIES:   Allergies  Allergen Reactions  . Sulfa Antibiotics Rash  . Zinc Rash   VITALS:  Blood pressure (!) 121/54, pulse 63, temperature 98.3 F (36.8 C), temperature source Oral, resp. rate 18, height 5\' 2"  (1.575 m), weight 65.1 kg, SpO2 98 %. PHYSICAL EXAMINATION:  Physical Exam  GENERAL:  85 y.o.-year-old patient lying in the bed with no acute distress.  EYES: Pupils equal, round, reactive to light and accommodation. No scleral icterus. Extraocular muscles intact.  HEENT: Head atraumatic, normocephalic. Oropharynx and nasopharynx clear.  NECK:  Supple, no jugular venous distention. No thyroid enlargement, no tenderness.  LUNGS: Normal breath sounds bilaterally, no wheezing, rales,rhonchi or crepitation. No use of accessory muscles of respiration.  CARDIOVASCULAR: S1, S2 normal. No murmurs, rubs, or gallops.  ABDOMEN:  Soft, No tenderness , nondistended. Bowel sounds present. No organomegaly or mass.  EXTREMITIES: No pedal edema, cyanosis, or clubbing.  Significant pain /tenderness with movement of lower extremity  NEUROLOGIC: Cranial nerves II through XII are intact. Muscle strength 4/5 in all extremities. Sensation intact. Gait not checked.  She does not want to move much in the bed due to significant pain. PSYCHIATRIC: The patient is alert and oriented x 3.  SKIN: No obvious rash, lesion, or ulcer.  LABORATORY PANEL:  Female CBC Recent Labs  Lab 06/04/18 0612  WBC 11.8*  HGB 7.8*  HCT 25.9*  PLT 417*   ------------------------------------------------------------------------------------------------------------------ Chemistries  Recent Labs  Lab 06/01/18 0259  06/04/18 0612 06/05/18 0533  NA 133*   < > 136 134*  K 4.1   < > 3.3* 4.4  CL 108   < > 107 103  CO2 19*   < > 21* 25  GLUCOSE 101*   < > 90 95  BUN 14   < > 12 9  CREATININE 0.73   < > 0.54 0.65  CALCIUM 7.8*   < > 7.8* 7.6*  MG  --    < > 1.9  --   AST 31  --   --   --   ALT 18  --   --   --   ALKPHOS 58  --   --   --   BILITOT 0.5  --   --   --    < > = values in this interval not displayed.   RADIOLOGY:  No results found. ASSESSMENT AND PLAN:   85 year old female patient with a known history  of DVT in the left leg, endometrial cancer, status post chemotherapy and radiation therapy, hypertension, hypothyroidism, chronic congestive heart failure presented to the emergency room for generalized weakness, nausea and vomiting and diarrhea.  Diagnosed with sepsis.  Etiology not yet determined  1. Sepsis Unknown etiology.  Patient was initially managed in the ICU and subsequently transferred to the medical floor.  Patient was initially placed on broad-spectrum IV antibiotics with vancomycin and cefepime . Patient noted to have fevers with temperature of 101.2 two nights ago.  No fevers however in the last 24 hours. So far no  evidence of infectious process found.  Influenza test negative.  TEE done 2 days ago with no vegetation. Seen by infectious disease specialist.  Appreciate input.  Fevers likely related to underlying malignancy.  Due to concern for possible bone erosion or concern for vaginal extension, recommended transvaginal ultrasound and bilateral lower extremity venous Doppler ultrasound to rule out new DVT.  Due to severity of lower extremity pains, unable to move patient around for any further studies unless patient has conscious sedation prior to imaging.  Patient however not keen on any further imaging since this would not necessarily change management.  Patient already on anticoagulation for DVT.  Patient also not a candidate for any chemotherapy for underlying malignancy as confirmed by oncologist.   2.  Non-ST MI elevation Patient currently on heparin drip.  Denies any chest pain. 2D echocardiogram done with evidence of ischemic cardiomyopathy with ejection fraction of 30 to 35%.  Unable to exclude underlying ischemia. Patient had TEE done due to concern to rule out endocarditis..  No valvular vegetation noted as such endocarditis ruled out.  Ejection fraction of 30 to 35% noted with severe hypokinesis of the left ventricle. Cardiologist confirmed that patient is not a candidate for cardiac catheterization at this time due to anemia and unable to reposition patient secondary to severe pains..  Continue medical management.  Continue aspirin and beta-blocker.  Add statins.  3.  Chronic systolic CHF Patient with heart failure with reduced ejection fraction.  Stable.  4.  Mild hyperkalemia Resolved  5.Acute diarrhea self-limiting; appears resolved C. difficile negative.  6.Endometrial and ovarian malignancy Patient seen by oncologist.  Given declining performance status and multiple medical problems including cardiac issues, patient currently not a candidate for additional chemotherapy.  If performance  status improves, they will reconsider in the future Oncology follow-up  7. History of DVT left leg Patient was on Eliquis prior to admission which was initially placed on hold due to patient being placed on heparin drip for management of non-ST MI elevation.  Heparin drip discontinued , transition to home dose of Eliquis  8.  Anemia Etiology not clear.  Noted drop in hemoglobin to 7.6 yesterday.  Hemoglobin stable at 7.6 today.. Clinically no evidence of bleeding.  Iron studies requested. Follow-up CBC in a.m.  9.  Lower extremity pains Patient does have significant pains when moved around.  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 Seen by palliative care team for pain management.  Patient currently not does not want involvement of hospice.  Wishes to go to rehab on discharge.  Increased dose of pain meds including adding PRN IV Dilaudid. I discussed the option of nerve block with interventional radiologist Dr. Annamaria Boots who stated this is usually done by anesthesiology/pain management.  I have texted anesthesiologist- they don't do this procedure. I will contact pain control clinic tomorrow [facility]) as they are not open on  weekends.  DVT prophylaxis; patient now being started back on Eliquis Disposition; case manager working on rehab placement-need better pain control before that.  All the records are reviewed and case discussed with Care Management/Social Worker. Management plans discussed with the patient, family and they are in agreement.  CODE STATUS: DNR  TOTAL TIME TAKING CARE OF THIS PATIENT: 31 minutes.   More than 50% of the time was spent in counseling/coordination of care: YES  POSSIBLE D/C IN 2 DAYS, DEPENDING ON CLINICAL CONDITION.   Vaughan Basta M.D on 06/05/2018 at 2:50 PM  Between 7am to 6pm - Pager - [phone]  After 6pm go to [email or web address] - Proofreader  [facility]   Office  [phone]  CC: Primary care physician; [name], MD  Note: This dictation was prepared with Dragon dictation along with smaller phrase technology. Any transcriptional errors that result from this process are unintentional.

## 2018-06-05 NOTE — TOC Progression Note (Signed)
Transition of Care [facility]) - Progression Note    Patient Details  Name: Vickie Barton MRN: [id number] Date of Birth: [birth year]  Transition of [facility] CM/SW [place], [place] Phone Number: 06/05/2018, 11:08 AM  Clinical Narrative:   The CSW performed a chart review on this patient. The plan is for the patient to discharge to [facility] per handoff from [facility]. The TOC is following. The proposed date of discharge is 06/06/2018.    Expected Discharge Plan: [place] Barriers to Discharge: Continued Medical Work up  Expected Discharge Plan and Services Expected Discharge Plan: [place] Discharge Planning Services: CM Consult Post Acute Care Choice: [place] Living arrangements for the past 2 months: Single Family Home                           Social Determinants of Health (SDOH) Interventions    Readmission Risk Interventions 30 Day Unplanned Readmission Risk Score     ED to Hosp-Admission (Current) from 05/30/2018 in [place] (2A)  30 Day Unplanned Readmission Risk Score (%)  17 Filed at 06/05/2018 0801     This score is the patient's risk of an unplanned readmission within 30 days of being discharged (0 -100%). The score is based on dignosis, age, lab data, medications, orders, and past utilization.   Low:  0-14.9   Medium: 15-21.9   High: 22-29.9   Extreme: 30 and above       No flowsheet data found.

## 2018-06-06 DIAGNOSIS — R52 Pain, unspecified: Secondary | ICD-10-CM

## 2018-06-06 MED ORDER — OXYCODONE HCL 5 MG PO TABS
15.0000 mg | ORAL_TABLET | ORAL | Status: DC | PRN
  Administered 2018-06-06 – 2018-06-07 (×3): 15 mg via ORAL
  Filled 2018-06-06 (×3): qty 3

## 2018-06-06 MED ORDER — SENNA 8.6 MG PO TABS
1.0000 | ORAL_TABLET | Freq: Two times a day (BID) | ORAL | Status: DC
  Administered 2018-06-06: 8.6 mg via ORAL
  Filled 2018-06-06 (×2): qty 1

## 2018-06-06 NOTE — Progress Notes (Signed)
PT Cancellation Note  Patient Details Name: CLARABELLE OSCARSON MRN: [id number] DOB: 1930/08/12   Cancelled Treatment:    Reason Eval/Treat Not Completed: Pain limiting ability to participate(Treatment session attempted.  Patient currently declining session due to pain in L LE, stating "it's okay as long as I don't move at all".  Has pain meds on board; denies additional need at this time.  Will continue efforts; however, question ability to fully/consistently participate with STR if pain not markedly improved.)   Lesleyann Fichter H. Owens Shark, PT, DPT, NCS 06/06/18, 3:22 PM [phone]

## 2018-06-06 NOTE — Progress Notes (Signed)
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 Patient has had chronic pain from progressive metastatic disease. CT on 05/20/2018 reveals large left pelvic sidewall mass, which had increased in size from previous study and likely is exacerbating pain. Further progression of the mass was noted on repeat CT on 05/30/18.  Patient was admitted to the hospital on 05/30/2018 with sepsis from unclear source.  TTE was concerning for possible endocarditis.  Patient also noted to have cardiomyopathy with EF 35% and anterior wall dysfunction with acute elevation of troponin concerning for non-STEMI.  Palliative care was consulted to help address goals.   CODE STATUS: DNR  PAST MEDICAL HISTORY: Past Medical History:  Diagnosis Date   Arthritis    CHF (congestive heart failure) ([place])    Constipation    DVT (deep venous thrombosis) ([place]) 03/2017   left leg   Endometrial cancer ([place]) 01/28/2016   Hysterectomy, chemo + rad tx's, also internal brachytherapy on 02/2018.    Hyperlipidemia    Hypertension    Hypothyroidism     PAST SURGICAL HISTORY:  Past Surgical History:  Procedure Laterality Date   CYSTOSCOPY W/ RETROGRADES Left 01/01/2017   Procedure: CYSTOSCOPY WITH RETROGRADE PYELOGRAM;  Surgeon: Nickie Retort, MD;  Location: [facility];  Service: Urology;  Laterality: Left;   CYSTOSCOPY W/ RETROGRADES Left 04/23/2017   Procedure: CYSTOSCOPY WITH RETROGRADE PYELOGRAM;  Surgeon: Abbie Sons, MD;  Location: [facility];  Service: Urology;  Laterality: Left;   CYSTOSCOPY W/ URETERAL STENT PLACEMENT Left 04/23/2017   Procedure: CYSTOSCOPY WITH STENT REMOVAL;  Surgeon: Abbie Sons, MD;  Location: [facility];  Service: Urology;  Laterality: Left;   CYSTOSCOPY WITH STENT PLACEMENT Left 01/01/2017   Procedure: CYSTOSCOPY WITH STENT PLACEMENT;  Surgeon: Nickie Retort, MD;  Location: [facility];  Service: Urology;  Laterality: Left;   DILATION AND CURETTAGE OF UTERUS     HYSTEROSCOPY W/D&C N/A 01/28/2016   Procedure: DILATATION AND CURETTAGE /HYSTEROSCOPY;  Surgeon: [name], MD;  Location: [facility];  Service: Gynecology;  Laterality: N/A;   LAPAROSCOPIC BILATERAL SALPINGO OOPHERECTOMY Bilateral 02/26/2016   Procedure: LAPAROSCOPIC BILATERAL SALPINGO OOPHORECTOMY;  Surgeon: [name], MD;  Location: [facility];  Service: Gynecology;  Laterality: Bilateral;   LAPAROSCOPIC HYSTERECTOMY N/A 02/26/2016   Procedure: HYSTERECTOMY TOTAL LAPAROSCOPIC;  Surgeon: [name], MD;  Location: [facility];  Service: Gynecology;  Laterality: N/A;   SENTINEL NODE BIOPSY N/A 02/26/2016   Procedure: SENTINEL NODE BIOPSY;  Surgeon: [name], MD;  Location: [facility];  Service: Gynecology;  Laterality: N/A;   TEE WITHOUT CARDIOVERSION N/A 06/01/2018   Procedure: TRANSESOPHAGEAL ECHOCARDIOGRAM (TEE);  Surgeon: [name], MD;  Location: [facility];  Service: Cardiovascular;  Laterality: N/A;   TONSILLECTOMY      HEMATOLOGY/ONCOLOGY HISTORY:   No history exists.    ALLERGIES:  is allergic to sulfa antibiotics  and zinc.  MEDICATIONS:  Current Facility-Administered Medications  Medication Dose Route Frequency Provider  Last Rate Last Dose   0.9 %  sodium chloride infusion   Intravenous PRN Stark Jock, Jude, MD 10 mL/hr at 06/06/18 0051 500 mL at 06/06/18 0051   acetaminophen (TYLENOL) tablet 650 mg  650 mg Oral Q6H PRN Saundra Shelling, MD   650 mg at 06/03/18 1329   Or   acetaminophen (TYLENOL) suppository 650 mg  650 mg Rectal Q6H PRN Saundra Shelling, MD       ALPRAZolam Duanne Moron) tablet 0.25 mg  0.25 mg Oral BID PRN Stark Jock, Jude, MD   0.25 mg at 06/03/18 1131   Ampicillin-Sulbactam (UNASYN) 3 g in sodium chloride 0.9 % 100 mL IVPB  3 g Intravenous Q6H Ravishankar, Joellyn Quails, MD 200 mL/hr at 06/06/18 1253 3 g at 06/06/18 1253   apixaban (ELIQUIS) tablet 5 mg  5 mg Oral BID Stark Jock, Jude, MD   5 mg at 06/06/18 0981   aspirin EC tablet 81 mg  81 mg Oral Daily [name], MD   81 mg at 06/06/18 0934   atorvastatin (LIPITOR) tablet 10 mg  10 mg Oral q1800 [name], MD   10 mg at 06/05/18 1704   calcium-vitamin D (OSCAL WITH D) 500-200 MG-UNIT per tablet 1 tablet  1 tablet Oral Daily Pyreddy, Reatha Harps, MD   1 tablet at 06/06/18 0934   docusate sodium (COLACE) capsule 100 mg  100 mg Oral BID Stark Jock, Jude, MD   100 mg at 06/06/18 0934   HYDROmorphone (DILAUDID) injection 0.5 mg  0.5 mg Intravenous Q4H PRN Delmi Fulfer, [name], NP   0.5 mg at 06/05/18 1936   levothyroxine (SYNTHROID, LEVOTHROID) tablet 150 mcg  150 mcg Oral Q0600 Darel Hong D, NP   150 mcg at 06/06/18 1914   metoprolol tartrate (LOPRESSOR) tablet 12.5 mg  12.5 mg Oral BID [name], MD   12.5 mg at 06/06/18 0934   ondansetron (ZOFRAN) injection 4 mg  4 mg Intravenous Q6H PRN Saundra Shelling, MD       oxyCODONE (Oxy IR/ROXICODONE) immediate release tablet 15 mg  15 mg Oral Q3H PRN Vaughan Basta, MD       oxyCODONE (OXYCONTIN) 12 hr tablet 20 mg  20 mg Oral Q8H Vaughan Basta, MD   20 mg at 06/06/18 1417   senna (SENOKOT) tablet 8.6 mg  1 tablet Oral Daily PRN Khalik Pewitt, [name], NP       senna (SENOKOT) tablet 8.6  mg  1 tablet Oral BID Vaughan Basta, MD   8.6 mg at 06/06/18 1254   sodium chloride flush (NS) 0.9 % injection 3 mL  3 mL Intravenous Q12H Ojie, Jude, MD   3 mL at 06/06/18 0944   sodium chloride flush (NS) 0.9 % injection 3 mL  3 mL Intravenous PRN Ojie, Jude, MD        VITAL SIGNS: BP 119/66 (BP Location: Right Arm)    Pulse 77    Temp 98.2 F (36.8 C) (Oral)    Resp 20    Ht 5\' 2"  (1.575 m)    Wt 143 lb 9.6 oz (65.1 kg)    SpO2 97%    BMI 26.26 kg/m  Filed Weights   06/01/18 0304 06/01/18 0918 06/05/18 0430  Weight: 140 lb 3.2 oz (63.6 kg) 135 lb (61.2 kg) 143 lb 9.6 oz (65.1 kg)    Estimated body mass index is 26.26 kg/m as calculated from the following:  Height as of this encounter: 5\' 2"  (1.575 m).   Weight as of this encounter: 143 lb 9.6 oz (65.1 kg).  LABS: CBC:    Component Value Date/Time   WBC 11.8 (H) 06/04/2018 0612   HGB 7.8 (L) 06/04/2018 0612   HCT 25.9 (L) 06/04/2018 0612   PLT 417 (H) 06/04/2018 0612   MCV 90.9 06/04/2018 0612   NEUTROABS 9.6 (H) 05/26/2018 1544   LYMPHSABS 0.8 05/26/2018 1544   MONOABS 0.7 05/26/2018 1544   EOSABS 0.0 05/26/2018 1544   BASOSABS 0.0 05/26/2018 1544   Comprehensive Metabolic Panel:    Component Value Date/Time   NA 134 (L) 06/05/2018 0533   K 4.4 06/05/2018 0533   CL 103 06/05/2018 0533   CO2 25 06/05/2018 0533   BUN 9 06/05/2018 0533   CREATININE 0.65 06/05/2018 0533   GLUCOSE 95 06/05/2018 0533   CALCIUM 7.6 (L) 06/05/2018 0533   AST 31 06/01/2018 0259   ALT 18 06/01/2018 0259   ALKPHOS 58 06/01/2018 0259   BILITOT 0.5 06/01/2018 0259   PROT 5.3 (L) 06/01/2018 0259   ALBUMIN 1.8 (L) 06/01/2018 0259    RADIOGRAPHIC STUDIES: Ct Abdomen Pelvis W Contrast  Result Date: 05/30/2018 CLINICAL DATA:  Acute generalized abdominal pain. EXAM: CT ABDOMEN AND PELVIS WITH CONTRAST TECHNIQUE: Multidetector CT imaging of the abdomen and pelvis was performed using the standard protocol following bolus administration  of intravenous contrast. CONTRAST:  77mL OMNIPAQUE IOHEXOL 300 MG/ML  SOLN COMPARISON:  CT scan of May 20, 2018. FINDINGS: Lower chest: No acute abnormality. Hepatobiliary: No focal liver abnormality is seen. No gallstones, gallbladder wall thickening, or biliary dilatation. Pancreas: Unremarkable. No pancreatic ductal dilatation or surrounding inflammatory changes. Spleen: Stable lucency seen in the spleen most consistent with hemangioma or cyst. No other splenic abnormality seen. Adrenals/Urinary Tract: Adrenal glands appear normal. Stable right renal cysts are noted. Minimal left hydronephrosis is noted but there is no evidence of obstructing calculus. No definite evidence of ureteral compression is noted. Urinary bladder is unremarkable. Stomach/Bowel: The stomach appears normal. There is no evidence of bowel obstruction or inflammation. The appendix appears unremarkable. Vascular/Lymphatic: Atherosclerosis of abdominal aorta is noted without aneurysm formation. Reproductive: Status post hysterectomy and bilateral oophorectomy. 6.9 x 4.6 cm mass is noted in left side of pelvis and left iliac fossa consistent with ovarian malignancy or metastatic adenopathy. This is slightly enlarged compared to prior exam. Other: No abdominal wall hernia or abnormality. No abdominopelvic ascites. Musculoskeletal: No acute or significant osseous findings. IMPRESSION: 6.9 x 4.6 cm mass is noted in left pelvic sidewall and left iliac fossa consistent with ovarian malignancy or metastatic adenopathy. This is slightly enlarged compared to prior exam. Minimal left hydronephrosis is noted which is slightly improved compared to prior exam. No ureteral calculus or definite evidence of external compression is noted. Aortic Atherosclerosis (ICD10-I70.0). Electronically Signed   By: Marijo Conception, M.D.   On: 05/30/2018 14:46   Ct Abdomen Pelvis W Contrast  Result Date: 05/20/2018 CLINICAL DATA:  Pt c/o severe Left groin pain  radiating down her Left leg x 2 weeks. NKI Hx Endometrial CA diagnosed in 01/2017 and she had total hysterectomy, chemo + rad tx's. Also had internal brachytherapy on 02/2018. EXAM: CT ABDOMEN AND PELVIS WITH CONTRAST TECHNIQUE: Multidetector CT imaging of the abdomen and pelvis was performed using the standard protocol following bolus administration of intravenous contrast. CONTRAST:  53mL OMNIPAQUE IOHEXOL 300 MG/ML  SOLN COMPARISON:  Current abdomen radiographs. PET-CT, 02/04/2018. Abdomen  and pelvis CT, 01/10/2018. FINDINGS: Lower chest: No acute abnormality. Hepatobiliary: Normal liver. Gallbladder is distended but otherwise unremarkable. No bile duct dilation. Lucency noted along the right margin of the liver on the current abdomen radiographs is likely due to the right colon adjacent to the liver, accentuated by patient rotation. Pancreas: Unremarkable. No pancreatic ductal dilatation or surrounding inflammatory changes. Spleen: 9 mm low-density lesion in the medial spleen, stable from the prior exam, likely a cyst or hemangioma. Spleen normal in size. No other masses or lesions. Adrenals/Urinary Tract: No adrenal masses. No adrenal masses. Low-density right renal masses consistent with cysts, stable. No left renal masses. Mild left hydronephrosis with dilation of the proximal and mid left ureter. No ureteral stone. Ureter comes in contact with a left pelvic sidewall mass. No right hydronephrosis. Normal right ureter. Bladder is unremarkable. Stomach/Bowel: Stomach is unremarkable. Small bowel and colon are normal in caliber. There are colonic diverticula without diverticulitis. No small bowel or colon wall thickening or inflammation. Normal appendix visualized. Vascular/Lymphatic: Aortic atherosclerosis extending to its branch vessels. No aneurysm. Stable inferior vena cava filter Left pelvic sidewall masses with associated calcifications, which may reflect locally invasive endometrial carcinoma or  metastatic adenopathy. The precaval and right common iliac artery lymphadenopathy noted on the prior study has significantly improved with no residual enlarged lymph nodes. No other evidence of lymphadenopathy. Reproductive: Status post hysterectomy. Left pelvic sidewall mass, which is contiguous with the left iliacus muscle. Mass measures 7.3 x 3.9 x 5.8 cm, measuring 6.1 x 3.6 x 4.64 cm on the prior CT. There are associated calcifications mostly along its inferior margin. No underlying bone resorption. Other: No abdominal wall hernia.  No ascites. Musculoskeletal: No fracture or acute finding. No osteoblastic or osteolytic lesions. IMPRESSION: 1. The lucency along the right margin of the liver noted on the current abdomen radiographs was artifactual, most likely due to the right colon along the anterolateral liver margin, accentuated by patient rotation. 2. Mild left hydronephrosis and hydroureter, new since the prior CT. This appears due to an extrinsic effect by left pelvic sidewall mass, which contacts the distal left ureter. No ureteral stone. 3. Left pelvic sidewall mass, consistent with locally invasive endometrial carcinoma or metastatic adenopathy or a combination, has increased in size from the prior CT. However, the precaval and right common iliac chain adenopathy noted on the prior study has significantly improved. 4. No other evidence of metastatic disease. 5. Other chronic findings, including aortic atherosclerosis, are stable from prior CT. Electronically Signed   By: Lajean Manes M.D.   On: 05/20/2018 16:18   Dg Bone Density  Result Date: 05/12/2018 EXAM: DUAL X-RAY ABSORPTIOMETRY (DXA) FOR BONE MINERAL DENSITY IMPRESSION: Technologist: SCE PATIENT BIOGRAPHICAL: Name: Aniela, Caniglia Patient ID: [id number] Birth Date: 20-Sep-1930 Height: 60.0 in. Gender: Female Exam Date: 05/12/2018 Weight: 132.5 lbs. Indications: Advanced Age, Caucasian, Height Loss, History of Chemo, History of Endometrial  Cancer, History of Radiation, Hypothyroid, Hysterectomy, Oophorectomy Bilateral, Postmenopausal Fractures: Treatments: Levothyroxine ASSESSMENT: The BMD measured at Forearm Radius 33% is 0.534 g/cm2 with a T-score of -3.9. This patient is considered osteoporotic according to [place] [facility]) criteria. The quality of the scan was limited. Lumbar spine was not utilized due to surgrical hardware visible. Site Region Measured Measured WHO Young Adult BMD Date       Age      Classification T-score DualFemur Total Right 05/12/2018 85.8 Osteoporosis -3.0 0.628 g/cm2 Left Forearm Radius 33% 05/12/2018 85.8 Osteoporosis -3.9 0.534 g/cm2 World  Health Organization [facility]) criteria for post-menopausal, Caucasian Women: Normal:       T-score at or above -1 SD Osteopenia:   T-score between -1 and -2.5 SD Osteoporosis: T-score at or below -2.5 SD RECOMMENDATIONS: 1. All patients should optimize calcium and vitamin D intake. 2. Consider FDA-approved medical therapies in postmenopausal women and men aged 79 years and older, based on the following: a. A hip or vertebral(clinical or morphometric) fracture b. T-score < -2.5 at the femoral neck or spine after appropriate evaluation to exclude secondary causes c. Low bone mass (T-score between -1.0 and -2.5 at the femoral neck or spine) and a 10-year probability of a hip fracture > 3% or a 10-year probability of a major osteoporosis-related fracture > 20% based on the [place]-adapted WHO algorithm d. Clinician judgment and/or patient preferences may indicate treatment for people with 10-year fracture probabilities above or below these levels FOLLOW-UP: People with diagnosed cases of osteoporosis or at high risk for fracture should have regular bone mineral density tests. For patients eligible for Medicare, routine testing is allowed once every 2 years. The testing frequency can be increased to one year for patients who have rapidly progressing disease, those who are receiving or  discontinuing medical therapy to restore bone mass, or have additional risk factors. I have reviewed this report, and agree with the above findings. [facility]iology Electronically Signed   By: Lowella Grip III M.D.   On: 05/12/2018 09:27   Dg Chest Port 1 View  Result Date: 05/30/2018 CLINICAL DATA:  Sepsis.  Ovarian cancer. EXAM: PORTABLE CHEST 1 VIEW COMPARISON:  11/04/2016 FINDINGS: The heart size and mediastinal contours are within normal limits. Both lungs are clear. The visualized skeletal structures are unremarkable. IMPRESSION: No active disease. Electronically Signed   By: Lorriane Shire M.D.   On: 05/30/2018 13:32   Dg Abd 2 Views  Result Date: 05/20/2018 CLINICAL DATA:  LLQ pain getting worse since 1 week ago/ the pain in setting of recurrent endometerial cancerHysterectomy 2 years ago Constipation X days EXAM: ABDOMEN - 2 VIEW COMPARISON:  CT, 01/10/2018 FINDINGS: Normal bowel gas pattern.  No evidence of obstruction. On the initial supine image, there is relative lucency along the right lateral margin of the liver shadow with an apparent irregular contour of the right lateral liver margin. This is of unclear etiology. Stable well-positioned inferior vena cava filter. No evidence of renal or ureteral stones. Clear lung bases.  No acute skeletal abnormality. IMPRESSION: 1. No acute findings.  No evidence of bowel obstruction. 2. Abnormal appearing lucency along the right lateral margin of the liver of unclear etiology. This could potentially be artifact. This could be further assessed with CT. Electronically Signed   By: Lajean Manes M.D.   On: 05/20/2018 12:48   Dg Hip Unilat With Pelvis 2-3 Views Left  Result Date: 06/03/2018 CLINICAL DATA:  Chronic bilateral hip pain. Left side worse than right. EXAM: DG HIP (WITH OR WITHOUT PELVIS) 2-3V LEFT COMPARISON:  CT 05/30/2018. FINDINGS: Diffuse osteopenia. Degenerative changes lumbar spine and both hips. No acute bony abnormality  identified. No evidence of fracture dislocation. Aortoiliac atherosclerotic vascular calcification. IMPRESSION: Diffuse osteopenia. Degenerative changes lumbar spine and both hips. No acute bony abnormality. Electronically Signed   By: [name]  Register   On: 06/03/2018 08:56   Dg Hip Unilat With Pelvis 2-3 Views Right  Result Date: 06/03/2018 CLINICAL DATA:  Chronic RIGHT hip pain. EXAM: DG HIP (WITH OR WITHOUT PELVIS) 2-3V RIGHT COMPARISON:  05/20/2018 radiographs  and prior studies FINDINGS: No fracture or dislocation. Mild degenerative change in both hips and LOWER lumbar spine again noted. No suspicious focal bony lesions noted. IMPRESSION: 1. No acute abnormality 2. Mild degenerative changes in both hips and LOWER lumbar spine. Electronically Signed   By: [name] M.D.   On: 06/03/2018 09:00    PERFORMANCE STATUS (ECOG) : 4 - Bedbound  REVIEW OF SYSTEMS: As noted above. Otherwise, a complete review of systems is negative.  PHYSICAL EXAM: General: NAD, frail appearing, thin Cardiovascular: regular rate and rhythm Pulmonary: clear ant fields Abdomen: soft, nontender, + bowel sounds GU: no suprapubic tenderness Extremities: edema BLE Skin: no rashes Neurological: Weakness but otherwise nonfocal   IMPRESSION: Follow-up visit made with patient. Patient says pain is better controlled as long as she does not move. Currently rates pain as 3 out of 10 with rest. However, pain is 10 out of 10 with movement. OxyContin was again increased yesterday.   [facility] and [facility] a [facility] at [facility]. It does not sound like they are able to provide inpatient consults.   Case discussed with Drs. Vachhani and [place].   PLAN: -Continue current scope of treatment -Continue OxyContin to 20 mg every 8 hours -Continue oxycodone IR to 15 mg every 3 hours PRN for breakthrough pain -Continue prophylactic bowel regimen -Anticipate rehab with palliative care following and likely will need hospice  when she returns home    Time Total: 15 minutes  Visit consisted of counseling and education dealing with the complex and emotionally intense issues of symptom management and palliative care in the setting of serious and potentially life-threatening illness.Greater than 50%  of this time was spent counseling and coordinating care related to the above assessment and plan.  Signed by: Altha Harm, PhD, NP-C [phone] (Work Cell)

## 2018-06-06 NOTE — Care Management Important Message (Signed)
Important Message  Patient Details  Name: Vickie Barton MRN: [id number] Date of Birth: Feb 22, 1931   Medicare Important Message Given:  Yes    Dannette Barbara 06/06/2018, 12:01 PM

## 2018-06-06 NOTE — Progress Notes (Signed)
[facility] [place] NAME: Vickie Barton    MR#:  [id number]  DATE OF BIRTH:  05-11-1930  SUBJECTIVE:  CHIEF COMPLAINT:   Chief Complaint  Patient presents with  . Fever  . Weakness  . Nausea  . Emesis   No fevers.  Patient still having significant lower extremity pain when moved around in bed.   On long-acting oxycodone and short release oxycodone for pain control.  REVIEW OF SYSTEMS:  Review of Systems  Constitutional: Negative for chills and fever.  HENT: Negative for hearing loss and tinnitus.   Eyes: Negative for blurred vision and double vision.  Respiratory: Negative for cough and hemoptysis.   Cardiovascular: Negative for chest pain and palpitations.  Gastrointestinal: Negative for heartburn, nausea and vomiting.  Genitourinary: Negative for dysuria and urgency.  Musculoskeletal: Negative for myalgias and neck pain.  Skin: Negative for itching and rash.  Neurological: Negative for dizziness and headaches.  Psychiatric/Behavioral: Negative for depression and hallucinations.      DRUG ALLERGIES:   Allergies  Allergen Reactions  . Sulfa Antibiotics Rash  . Zinc Rash   VITALS:  Blood pressure 117/60, pulse 73, temperature 99.6 F (37.6 C), temperature source Oral, resp. rate 20, height 5\' 2"  (1.575 m), weight 65.1 kg, SpO2 97 %. PHYSICAL EXAMINATION:  Physical Exam  GENERAL:  85 y.o.-year-old patient lying in the bed with no acute distress.  EYES: Pupils equal, round, reactive to light and accommodation. No scleral icterus. Extraocular muscles intact.  HEENT: Head atraumatic, normocephalic. Oropharynx and nasopharynx clear.  NECK:  Supple, no jugular venous distention. No thyroid enlargement, no tenderness.  LUNGS: Normal breath sounds bilaterally, no wheezing, rales,rhonchi or crepitation. No use of accessory muscles of respiration.  CARDIOVASCULAR: S1, S2 normal. No murmurs, rubs, or gallops.  ABDOMEN: Soft, No  tenderness , nondistended. Bowel sounds present. No organomegaly or mass.  EXTREMITIES: No pedal edema, cyanosis, or clubbing.  Significant pain /tenderness with movement of lower extremity  NEUROLOGIC: Cranial nerves II through XII are intact. Muscle strength 4/5 in all extremities. Sensation intact. Gait not checked.  She does not want to move much in the bed due to significant pain. PSYCHIATRIC: The patient is alert and oriented x 3.  SKIN: No obvious rash, lesion, or ulcer.  LABORATORY PANEL:  Female CBC Recent Labs  Lab 06/04/18 0612  WBC 11.8*  HGB 7.8*  HCT 25.9*  PLT 417*   ------------------------------------------------------------------------------------------------------------------ Chemistries  Recent Labs  Lab 06/01/18 0259  06/04/18 0612 06/05/18 0533  NA 133*   < > 136 134*  K 4.1   < > 3.3* 4.4  CL 108   < > 107 103  CO2 19*   < > 21* 25  GLUCOSE 101*   < > 90 95  BUN 14   < > 12 9  CREATININE 0.73   < > 0.54 0.65  CALCIUM 7.8*   < > 7.8* 7.6*  MG  --    < > 1.9  --   AST 31  --   --   --   ALT 18  --   --   --   ALKPHOS 58  --   --   --   BILITOT 0.5  --   --   --    < > = values in this interval not displayed.   RADIOLOGY:  No results found. ASSESSMENT AND PLAN:   85 year old female patient with a known history of  DVT in the left leg, endometrial cancer, status post chemotherapy and radiation therapy, hypertension, hypothyroidism, chronic congestive heart failure presented to the emergency room for generalized weakness, nausea and vomiting and diarrhea.  Diagnosed with sepsis.  Etiology not yet determined  1. Sepsis ruled out. Unknown etiology.  Patient was initially managed in the ICU and subsequently transferred to the medical floor.  Patient was initially placed on broad-spectrum IV antibiotics with vancomycin and cefepime . Patient noted to have fevers with temperature of 101.2 two nights ago.  No fevers however in the last 24 hours. So far no  evidence of infectious process found.  Influenza test negative.  TEE done 2 days ago with no vegetation. Seen by infectious disease specialist.  Appreciate input.  Fevers likely related to underlying malignancy.  Due to concern for possible bone erosion or concern for vaginal extension, recommended transvaginal ultrasound and bilateral lower extremity venous Doppler ultrasound to rule out new DVT.  Due to severity of lower extremity pains, unable to move patient around for any further studies unless patient has conscious sedation prior to imaging.  Patient however not keen on any further imaging since this would not necessarily change management.  Patient already on anticoagulation for DVT.  Patient also not a candidate for any chemotherapy for underlying malignancy as confirmed by oncologist.   2.  Non-ST MI elevation Patient currently on heparin drip.  Denies any chest pain. 2D echocardiogram done with evidence of ischemic cardiomyopathy with ejection fraction of 30 to 35%.  Unable to exclude underlying ischemia. Patient had TEE done due to concern to rule out endocarditis..  No valvular vegetation noted as such endocarditis ruled out.  Ejection fraction of 30 to 35% noted with severe hypokinesis of the left ventricle. Cardiologist confirmed that patient is not a candidate for cardiac catheterization at this time due to anemia and unable to reposition patient secondary to severe pains..  Continue medical management.  Continue aspirin and beta-blocker.  Add statins. She is not a candidate for cardiac rehab due to her significant cancer related pain.  3.  Chronic systolic CHF Patient with heart failure with reduced ejection fraction.  Stable.  4.  Mild hyperkalemia Resolved  5.Acute diarrhea self-limiting; appears resolved C. difficile negative.  6.Endometrial and ovarian malignancy Patient seen by oncologist.  Given declining performance status and multiple medical problems including cardiac  issues, patient currently not a candidate for additional chemotherapy.  If performance status improves, they will reconsider in the future Oncology follow-up  7. History of DVT left leg Patient was on Eliquis prior to admission which was initially placed on hold due to patient being placed on heparin drip for management of non-ST MI elevation.  Heparin drip discontinued , transition to home dose of Eliquis  8.  Anemia Etiology not clear.  Noted drop in hemoglobin to 7.6 yesterday.  Hemoglobin stable at 7.6 today.. Clinically no evidence of bleeding.  Iron studies requested. Follow-up CBC in a.m.  9.  Lower extremity pains Patient does have significant pains when moved around.  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 Seen by palliative care team for pain management.  Patient currently not does not want involvement of hospice.  Wishes to go to rehab on discharge.  Increased dose of pain meds including adding PRN IV Dilaudid. I discussed the option of nerve block with interventional radiologist Dr. Annamaria Boots who stated this is usually done by anesthesiology/pain management.  I have texted anesthesiologist- they don't do this  procedure.  Palliative care has contacted pain control clinic but does not look like they do this procedure as inpatient.  We may be able to discharge the patient to rehab and let her follow to pain clinic as outpatient and palliative care nurse to follow at her rehab facility also.  DVT prophylaxis; patient now being started back on Eliquis Disposition; case manager working on rehab placement-need better pain control before that.  All the records are reviewed and case discussed with Care Management/Social Worker. Management plans discussed with the patient, family and they are in agreement.  CODE STATUS: DNR  TOTAL TIME TAKING CARE OF THIS PATIENT: 31 minutes.   More than 50% of the time was spent in counseling/coordination  of care: YES  POSSIBLE D/C IN 2 DAYS, DEPENDING ON CLINICAL CONDITION.   Vaughan Basta M.D on 06/06/2018 at 5:26 PM  Between 7am to 6pm - Pager - [phone]  After 6pm go to [email or web address] - Proofreader  [facility]  Office  [phone]  CC: Primary care physician; [name], MD  Note: This dictation was prepared with Dragon dictation along with smaller phrase technology. Any transcriptional errors that result from this process are unintentional.

## 2018-06-07 ENCOUNTER — Other Ambulatory Visit: Payer: Self-pay | Admitting: Oncology

## 2018-06-07 DIAGNOSIS — R52 Pain, unspecified: Secondary | ICD-10-CM | POA: Diagnosis not present

## 2018-06-07 DIAGNOSIS — K5909 Other constipation: Secondary | ICD-10-CM | POA: Diagnosis not present

## 2018-06-07 DIAGNOSIS — C548 Malignant neoplasm of overlapping sites of corpus uteri: Secondary | ICD-10-CM | POA: Diagnosis not present

## 2018-06-07 DIAGNOSIS — M2550 Pain in unspecified joint: Secondary | ICD-10-CM | POA: Diagnosis not present

## 2018-06-07 DIAGNOSIS — G893 Neoplasm related pain (acute) (chronic): Secondary | ICD-10-CM | POA: Diagnosis not present

## 2018-06-07 DIAGNOSIS — Z86718 Personal history of other venous thrombosis and embolism: Secondary | ICD-10-CM | POA: Diagnosis not present

## 2018-06-07 DIAGNOSIS — I82402 Acute embolism and thrombosis of unspecified deep veins of left lower extremity: Secondary | ICD-10-CM | POA: Diagnosis not present

## 2018-06-07 DIAGNOSIS — D649 Anemia, unspecified: Secondary | ICD-10-CM | POA: Diagnosis not present

## 2018-06-07 DIAGNOSIS — Z7401 Bed confinement status: Secondary | ICD-10-CM | POA: Diagnosis not present

## 2018-06-07 DIAGNOSIS — I82509 Chronic embolism and thrombosis of unspecified deep veins of unspecified lower extremity: Secondary | ICD-10-CM | POA: Diagnosis not present

## 2018-06-07 DIAGNOSIS — I10 Essential (primary) hypertension: Secondary | ICD-10-CM | POA: Diagnosis not present

## 2018-06-07 DIAGNOSIS — C541 Malignant neoplasm of endometrium: Secondary | ICD-10-CM | POA: Diagnosis not present

## 2018-06-07 DIAGNOSIS — R208 Other disturbances of skin sensation: Secondary | ICD-10-CM | POA: Diagnosis not present

## 2018-06-07 DIAGNOSIS — I82401 Acute embolism and thrombosis of unspecified deep veins of right lower extremity: Secondary | ICD-10-CM | POA: Diagnosis not present

## 2018-06-07 DIAGNOSIS — C562 Malignant neoplasm of left ovary: Secondary | ICD-10-CM | POA: Diagnosis not present

## 2018-06-07 DIAGNOSIS — M79662 Pain in left lower leg: Secondary | ICD-10-CM | POA: Diagnosis not present

## 2018-06-07 DIAGNOSIS — A419 Sepsis, unspecified organism: Secondary | ICD-10-CM | POA: Diagnosis not present

## 2018-06-07 DIAGNOSIS — E039 Hypothyroidism, unspecified: Secondary | ICD-10-CM | POA: Diagnosis not present

## 2018-06-07 DIAGNOSIS — Z66 Do not resuscitate: Secondary | ICD-10-CM | POA: Diagnosis not present

## 2018-06-07 DIAGNOSIS — C7989 Secondary malignant neoplasm of other specified sites: Secondary | ICD-10-CM | POA: Diagnosis not present

## 2018-06-07 DIAGNOSIS — M6281 Muscle weakness (generalized): Secondary | ICD-10-CM | POA: Diagnosis not present

## 2018-06-07 DIAGNOSIS — K5900 Constipation, unspecified: Secondary | ICD-10-CM | POA: Diagnosis not present

## 2018-06-07 DIAGNOSIS — I110 Hypertensive heart disease with heart failure: Secondary | ICD-10-CM | POA: Diagnosis not present

## 2018-06-07 DIAGNOSIS — I509 Heart failure, unspecified: Secondary | ICD-10-CM | POA: Diagnosis not present

## 2018-06-07 DIAGNOSIS — E785 Hyperlipidemia, unspecified: Secondary | ICD-10-CM | POA: Diagnosis not present

## 2018-06-07 DIAGNOSIS — E569 Vitamin deficiency, unspecified: Secondary | ICD-10-CM | POA: Diagnosis not present

## 2018-06-07 DIAGNOSIS — Z515 Encounter for palliative care: Secondary | ICD-10-CM | POA: Diagnosis not present

## 2018-06-07 LAB — CBC
HCT: 25.3 % — ABNORMAL LOW (ref 36.0–46.0)
Hemoglobin: 7.8 g/dL — ABNORMAL LOW (ref 12.0–15.0)
MCH: 27 pg (ref 26.0–34.0)
MCHC: 30.8 g/dL (ref 30.0–36.0)
MCV: 87.5 fL (ref 80.0–100.0)
Platelets: 527 10*3/uL — ABNORMAL HIGH (ref 150–400)
RBC: 2.89 MIL/uL — ABNORMAL LOW (ref 3.87–5.11)
RDW: 14.6 % (ref 11.5–15.5)
WBC: 11.2 10*3/uL — ABNORMAL HIGH (ref 4.0–10.5)
nRBC: 0 % (ref 0.0–0.2)

## 2018-06-07 MED ORDER — ASPIRIN 81 MG PO TBEC: 81.0000 mg | DELAYED_RELEASE_TABLET | Freq: Every day | ORAL | 0 refills | Status: DC

## 2018-06-07 MED ORDER — SENNA 8.6 MG PO TABS: 1.0000 | ORAL_TABLET | Freq: Every day | ORAL | 0 refills | Status: DC

## 2018-06-07 MED ORDER — DOCUSATE SODIUM 100 MG PO CAPS: 100.0000 mg | ORAL_CAPSULE | Freq: Two times a day (BID) | ORAL | 0 refills | Status: DC

## 2018-06-07 MED ORDER — METOPROLOL TARTRATE 25 MG PO TABS: 12.5000 mg | ORAL_TABLET | Freq: Two times a day (BID) | ORAL | 0 refills | Status: DC

## 2018-06-07 MED ORDER — OXYCODONE HCL ER 20 MG PO T12A: 20.0000 mg | EXTENDED_RELEASE_TABLET | Freq: Three times a day (TID) | ORAL | 0 refills | Status: DC

## 2018-06-07 MED ORDER — ATORVASTATIN CALCIUM 10 MG PO TABS: 10.0000 mg | ORAL_TABLET | Freq: Every day | ORAL | 0 refills | Status: DC

## 2018-06-07 MED ORDER — OXYCODONE HCL 15 MG PO TABS: 15.0000 mg | ORAL_TABLET | ORAL | 0 refills | Status: DC | PRN

## 2018-06-07 MED ORDER — ALPRAZOLAM 0.25 MG PO TABS: 0.2500 mg | ORAL_TABLET | Freq: Two times a day (BID) | ORAL | 0 refills | Status: DC | PRN

## 2018-06-07 NOTE — Plan of Care (Signed)
  Problem: Education: Goal: Knowledge of General Education information will improve Description Including pain rating scale, medication(s)/side effects and non-pharmacologic comfort measures Outcome: Progressing   Problem: Health Behavior/Discharge Planning: Goal: Ability to manage health-related needs will improve Outcome: Progressing   Problem: Clinical Measurements: Goal: Ability to maintain clinical measurements within normal limits will improve Outcome: Progressing Goal: Will remain free from infection Outcome: Progressing Goal: Diagnostic test results will improve Outcome: Progressing Goal: Respiratory complications will improve Outcome: Progressing Goal: Cardiovascular complication will be avoided Outcome: Progressing   Problem: Activity: Goal: Risk for activity intolerance will decrease Outcome: Progressing   Problem: Nutrition: Goal: Adequate nutrition will be maintained Outcome: Progressing   Problem: Coping: Goal: Level of anxiety will decrease Outcome: Progressing   Problem: Elimination: Goal: Will not experience complications related to bowel motility Outcome: Progressing Goal: Will not experience complications related to urinary retention Outcome: Progressing   Problem: Pain Managment: Goal: General experience of comfort will improve Outcome: Progressing   Problem: Safety: Goal: Ability to remain free from injury will improve Outcome: Progressing   Problem: Skin Integrity: Goal: Risk for impaired skin integrity will decrease Outcome: Progressing   Problem: Health Behavior/Discharge Planning: Goal: Ability to manage health-related needs will improve Outcome: Progressing

## 2018-06-07 NOTE — Discharge Summary (Signed)
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 Arthritis   . CHF (congestive heart failure) ([place])   . Constipation   . DVT (deep venous thrombosis) ([place]) 03/2017   left leg  . Endometrial cancer ([place]) 01/28/2016   Hysterectomy, chemo + rad tx's, also internal brachytherapy on 02/2018.   [name] Hyperlipidemia   . Hypertension   . Hypothyroidism     HOSPITAL COURSE:   85 year old female patientwith a known history ofDVT in the left leg, endometrialcancer, status post chemotherapy and radiation therapy, hypertension, hypothyroidism, chronic congestive heart failure presented to the emergency room for generalized weakness, nausea and vomiting and diarrhea.  Diagnosed with sepsis.  Etiology not yet determined  1. Sepsis ruled out. Unknown etiology.  Patient was initially managed in the ICU and subsequently transferred to the medical floor.  Patient was initially placed on broad-spectrum IV antibiotics with vancomycin and cefepime . Patient noted to have fevers with temperature of 101.2 two nights ago.  No fevers however in the last 24 hours. So far no evidence of infectious process found.  Influenza test negative.  TEE done 2 days ago with no vegetation. Seen by infectious disease specialist.  Appreciate input.  Fevers likely related to underlying malignancy.  Due to  concern for possible bone erosion or concern for vaginal extension, recommended transvaginal ultrasound and bilateral lower extremity venous Doppler ultrasound to rule out new DVT.  Stopped Abx. Due to severity of lower extremity pains, unable to move patient around for any further studies unless patient has conscious sedation prior to imaging.  Patient however not keen on any further imaging since this would not necessarily change management.  Patient already on anticoagulation for DVT.  Patient also not a candidate for any chemotherapy now for underlying malignancy as confirmed by oncologist.  If she improves in future, she can follow with cancer center to arrange for therapy.  2.  Non-ST MI elevation Patient was on heparin drip.  Denies any chest pain. 2D echocardiogram done with evidence of ischemic cardiomyopathy with ejection fraction of 30 to 35%.  Unable to exclude underlying ischemia. Patient had TEE done due to concern to rule out endocarditis..  No valvular vegetation noted as such endocarditis ruled out.  Ejection fraction of 30 to 35% noted with severe hypokinesis of the left ventricle. Cardiologist confirmed that patient is not a candidate for cardiac catheterization at this time due to anemia and unable to reposition patient secondary to severe pains..  Continue medical management.  Continue aspirin and beta-blocker.  Add statins. She is not a candidate for cardiac rehab due to her significant cancer related pain.  3.  Chronic systolic CHF Patient with heart failure with reduced ejection fraction.  Stable.  No ACE inhibitors due to BP issues.  NO candidate for Cardiac rehab or follow up with CHF clinic due to her cancer issues.  4.  Mild hyperkalemia Resolved  5.Acute diarrhea self-limiting; appears resolved C. difficile negative.  6.Endometrial and ovarian malignancy Patient seen by oncologist.  Given declining performance status and multiple medical problems including  cardiac issues, patient currently not a candidate for additional chemotherapy.  If performance status improves, they will reconsider in the future Oncology follow-up  7. History of DVT left leg Patient was on Eliquis prior to admission which was initially placed on hold due to patient being placed on heparin drip for management of non-ST MI elevation.  Heparin drip discontinued , transition to home dose of Eliquis  8.  Anemia Etiology not clear.  Noted drop in hemoglobin to 7.6 yesterday.  Hemoglobin stable at 7.6 today.. Clinically no evidence of bleeding.  Iron studies requested. Follow-up CBC in a.m.  9.  Lower extremity pains Patient does have significant pains when moved around.  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 Seen by palliative care team for pain management.  Patient currently not does not want involvement of hospice.  Wishes to go to rehab on discharge.  Increased dose of pain meds including adding PRN IV Dilaudid. I discussed the option of nerve block with interventional radiologist Dr. Annamaria Boots who stated this is usually done by anesthesiology/pain management.  I have texted anesthesiologist- they don't do this procedure.  Palliative care has contacted pain control clinic but does not look like they do this procedure as inpatient.  We may be able to discharge the patient to rehab and let her follow to pain clinic as outpatient and palliative care nurse to follow at her rehab facility also.  DVT prophylaxis; patient now being started back on Eliquis Disposition; case manager working on rehab placement-need better pain control before that.   DISCHARGE CONDITIONS:   Stable.  CONSULTS OBTAINED:  Treatment Team:  [name], MD Tsosie Billing, MD  DRUG ALLERGIES:   Allergies  Allergen Reactions  . Sulfa Antibiotics Rash  . Zinc Rash    DISCHARGE MEDICATIONS:   Allergies as of 06/07/2018       Reactions   Sulfa Antibiotics Rash   Zinc Rash      Medication List    STOP taking these medications   amoxicillin-clavulanate 875-125 MG tablet Commonly known as:  AUGMENTIN   furosemide 20 MG tablet Commonly known as:  LASIX     TAKE these medications   alendronate 70 MG tablet Commonly known as:  Fosamax Take 1 tablet (70 mg total) by mouth once a week. Take with a full glass of water on an empty stomach.   ALPRAZolam 0.25 MG tablet Commonly known as:  XANAX Take 1 tablet (0.25 mg total) by mouth 2 (two) times daily as needed for anxiety.   aspirin 81 MG EC tablet Take 1 tablet (81 mg total) by mouth daily.   atorvastatin 10 MG tablet Commonly known as:  LIPITOR Take 1 tablet (10 mg total) by mouth daily at 6 PM.   Calcium-D 600-400 MG-UNIT Tabs Take 1 tablet by mouth 1 day or 1 dose for 1 dose.   docusate sodium 100 MG capsule Commonly known as:  COLACE Take 1 capsule (100 mg total) by mouth 2 (two) times daily.   Eliquis 5 MG Tabs tablet Generic drug:  apixaban TAKE 2 TABLETS BY MOUTH TWICE A DAY   levothyroxine 150 MCG tablet Commonly known as:  SYNTHROID, LEVOTHROID TAKE 150 MCG BY MOUTH DAILY BEFORE BREAKFAST ON EMPTY STOMACH WITH A GLASS OF WATER 30-60MINS BEFORE  BREAKFAST   metoprolol tartrate 25 MG tablet Commonly known as:  LOPRESSOR Take 0.5 tablets (12.5 mg total) by mouth 2 (two) times daily.   ondansetron 8 MG tablet Commonly known as:  ZOFRAN Take 1 tablet (8 mg total) by mouth every 8 (eight) hours as needed for nausea or vomiting.   oxyCODONE 15 MG immediate release tablet Commonly known as:  ROXICODONE Take 1 tablet (15 mg total) by mouth every 4 (four) hours as needed for moderate pain, severe pain or breakthrough pain (for breakthrough pain). What changed:    medication strength  how much to take  when to take this  reasons to take this   oxyCODONE 20 mg 12 hr tablet Commonly known as:  OXYCONTIN Take 1 tablet (20 mg total) by  mouth every 8 (eight) hours. What changed:  You were already taking a medication with the same name, and this prescription was added. Make sure you understand how and when to take each.   senna 8.6 MG Tabs tablet Commonly known as:  SENOKOT Take 1 tablet (8.6 mg total) by mouth daily.        DISCHARGE INSTRUCTIONS:    Palliative care nurse to follow at rehab facility.  If you experience worsening of your admission symptoms, develop shortness of breath, life threatening emergency, suicidal or homicidal thoughts you must seek medical attention immediately by calling 911 or calling your MD immediately  if symptoms less severe.  You Must read complete instructions/literature along with all the possible adverse reactions/side effects for all the Medicines you take and that have been prescribed to you. Take any new Medicines after you have completely understood and accept all the possible adverse reactions/side effects.   Please note  You were cared for by a hospitalist during your hospital stay. If you have any questions about your discharge medications or the care you received while you were in the hospital after you are discharged, you can call the unit and asked to speak with the hospitalist on call if the hospitalist that took care of you is not available. Once you are discharged, your primary care physician will handle any further medical issues. Please note that NO REFILLS for any discharge medications will be authorized once you are discharged, as it is imperative that you return to your primary care physician (or establish a relationship with a primary care physician if you do not have one) for your aftercare needs so that they can reassess your need for medications and monitor your lab values.    Today   CHIEF COMPLAINT:   Chief Complaint  Patient presents with  . Fever  . Weakness  . Nausea  . Emesis    HISTORY OF PRESENT ILLNESS:  Vickie Barton  is a 85 y.o. female with  a known history of DVT in the left leg, endometrial cancer, status post chemotherapy and radiation therapy, hypertension, hypothyroidism, chronic congestive heart failure presented to the emergency room for generalized weakness, nausea and vomiting and diarrhea.  Patient had some abdominal pain which is aching in nature 5 out of 10 on a scale of 1-10.  Stool for C. difficile testing was negative.  Lactic acid was elevated.  Patient's potassium was also low when she presented to the emergency room.  She also had fever of 103.3 F.  Code sepsis was called and patient was started on broad-spectrum antibiotics.  Hospitalist service was consulted.  CT abdomen reviewed shows ovarian malignancy with no evidence of obstruction.  VITAL SIGNS:  Blood pressure (!) 112/51, pulse 75, temperature 98.4 F (36.9 C), temperature source Oral, resp. rate 20, height 5\' 2"  (1.575 m), weight 64.3 kg, SpO2 93 %.  I/O:    Intake/Output Summary (Last 24 hours) at 06/07/2018 0929 Last data filed at 06/06/2018 2143 Gross per 24 hour  Intake 3 ml  Output 550 ml  Net -547 ml    PHYSICAL EXAMINATION:  GENERAL:85 y.o.-year-old patient lying in the bed with no acute distress.  EYES: Pupils equal, round, reactive to light and accommodation. No scleral icterus. Extraocular muscles intact.  HEENT: Head atraumatic, normocephalic. Oropharynx and nasopharynx clear.  NECK: Supple, no jugular venous distention. No thyroid enlargement, no tenderness.  LUNGS: Normal breath sounds bilaterally, no wheezing, rales,rhonchi or crepitation. No use of accessory muscles of respiration.  CARDIOVASCULAR: S1, S2 normal. No murmurs, rubs, or gallops.  ABDOMEN: Soft, No tenderness , nondistended. Bowel sounds present. No organomegaly or mass.  EXTREMITIES: No pedal edema, cyanosis, or clubbing.  Significant pain /tenderness with movement of lower extremity  NEUROLOGIC: Cranial nerves II through XII are intact. Muscle strength 4/5 in upper  extremities. Sensation intact. Gait not checked.  She does not want to move much in the bed due to significant pain. PSYCHIATRIC: The patient is alert and oriented x 3.  SKIN: No obvious rash, lesion, or ulcer.   DATA REVIEW:   CBC Recent Labs  Lab 06/07/18 0515  WBC 11.2*  HGB 7.8*  HCT 25.3*  PLT 527*    Chemistries  Recent Labs  Lab 06/01/18 0259  06/04/18 0612 06/05/18 0533  NA 133*   < > 136 134*  K 4.1   < > 3.3* 4.4  CL 108   < > 107 103  CO2 19*   < > 21* 25  GLUCOSE 101*   < > 90 95  BUN 14   < > 12 9  CREATININE 0.73   < > 0.54 0.65  CALCIUM 7.8*   < > 7.8* 7.6*  MG  --    < > 1.9  --   AST 31  --   --   --   ALT 18  --   --   --   ALKPHOS 58  --   --   --   BILITOT 0.5  --   --   --    < > = values in this interval not displayed.    Cardiac Enzymes No results for input(s): TROPONINI in the last 168 hours.  Microbiology Results  Results for orders placed or performed during the hospital encounter of 05/30/18  Urine culture     Status: None   Collection Time: 05/30/18 12:31 PM  Result Value Ref Range Status   Specimen Description   Final    URINE, RANDOM Performed at [facility], [address], [place], [place] [address]    Special Requests   Final    NONE Performed at [facility], [address] [place], [place] [address]    Culture   Final    NO GROWTH Performed at [facility], [place] [address], [place],  [address]    Report Status 05/31/2018 FINAL  Final  Blood Culture (routine x 2)     Status: None   Collection Time: 05/30/18 12:32 PM  Result Value Ref Range Status   Specimen Description BLOOD BLOOD LEFT ARM  Final   Special Requests   Final    BOTTLES DRAWN AEROBIC AND ANAEROBIC Blood  Culture results may not be optimal due to an excessive volume of blood received in culture bottles   Culture   Final    NO GROWTH 5 DAYS Performed at [facility], [address] [place], [place] [address]     Report Status 06/04/2018 FINAL  Final  Blood Culture (routine x 2)     Status: None   Collection Time: 05/30/18 12:32 PM  Result Value Ref Range Status   Specimen Description BLOOD BLOOD RIGHT WRIST  Final   Special Requests   Final    BOTTLES DRAWN AEROBIC AND ANAEROBIC Blood Culture adequate volume   Culture   Final    NO GROWTH 5 DAYS Performed at [facility], [address] [place], [place] [address]    Report Status 06/04/2018 FINAL  Final  Gastrointestinal Panel by PCR , Stool     Status: None   Collection Time: 05/30/18 12:52 PM  Result Value Ref Range Status   Campylobacter species NOT DETECTED NOT DETECTED Final   Plesimonas shigelloides NOT DETECTED NOT DETECTED Final   Salmonella species NOT DETECTED NOT DETECTED Final   Yersinia enterocolitica NOT DETECTED NOT DETECTED Final   Vibrio species NOT DETECTED NOT DETECTED Final   Vibrio cholerae NOT DETECTED NOT DETECTED Final   Enteroaggregative E coli (EAEC) NOT DETECTED NOT DETECTED Final   Enteropathogenic E coli (EPEC) NOT DETECTED NOT DETECTED Final   Enterotoxigenic E coli (ETEC) NOT DETECTED NOT DETECTED Final   Shiga like toxin producing E coli (STEC) NOT DETECTED NOT DETECTED Final   Shigella/Enteroinvasive E coli (EIEC) NOT DETECTED NOT DETECTED Final   Cryptosporidium NOT DETECTED NOT DETECTED Final   Cyclospora cayetanensis NOT DETECTED NOT DETECTED Final   Entamoeba histolytica NOT DETECTED NOT DETECTED Final   Giardia lamblia NOT DETECTED NOT DETECTED Final   Adenovirus F40/41 NOT DETECTED NOT DETECTED Final   Astrovirus NOT DETECTED NOT DETECTED Final   Norovirus GI/GII NOT DETECTED NOT DETECTED Final   Rotavirus A NOT DETECTED NOT DETECTED Final   Sapovirus (I, II, IV, and V) NOT DETECTED NOT DETECTED Final    Comment: Performed at [facility], [address] [place], [place] [address]  C difficile quick scan w PCR reflex     Status: None   Collection Time: 05/30/18 12:52 PM   Result Value Ref Range Status   C Diff antigen NEGATIVE NEGATIVE Final   C Diff toxin NEGATIVE NEGATIVE Final   C Diff interpretation No C. difficile detected.  Final    Comment: Performed at [facility], [address] [place], [place] [address]  MRSA PCR Screening     Status: None   Collection Time: 05/30/18  6:36 PM  Result Value Ref Range Status   MRSA by PCR NEGATIVE NEGATIVE Final    Comment:        The GeneXpert MRSA Assay (FDA approved for NASAL specimens only), is one component of a comprehensive MRSA colonization surveillance program. It is not intended to diagnose MRSA infection nor to guide or monitor treatment for MRSA infections. Performed at [facility], [address] [place], [place] [address]     RADIOLOGY:  No results found.  EKG:   Orders placed or performed during the hospital encounter of 05/30/18  . ED EKG  . ED EKG  . EKG 12-Lead  . EKG 12-Lead      Management plans discussed with the patient, family and they are in agreement.  CODE STATUS:  Code Status Orders  (From admission, onward)         Start     Ordered   05/31/18 1703  Do not attempt resuscitation (DNR)  Continuous    Question Answer Comment  In the event of cardiac or respiratory ARREST Do not call a "code blue"   In the event of cardiac or respiratory ARREST Do not perform Intubation, CPR, defibrillation or ACLS   In the event of cardiac or respiratory ARREST Use medication by any route, position, wound care, and other measures to relive pain and suffering. May use oxygen, suction and manual treatment of airway obstruction as needed for comfort.      05/31/18 1702        Code Status History    Date Active Date Inactive Code Status Order ID Comments User Context   05/30/2018 1833 05/31/2018 1702 Full Code [id number]  Saundra Shelling, MD Inpatient   10/25/2016 0053 10/26/2016 1601 Full Code [id number]  Saundra Shelling, MD Inpatient      TOTAL TIME  TAKING CARE OF THIS PATIENT: 35 minutes.    Vaughan Basta M.D on 06/07/2018 at 9:29 AM  Between 7am to 6pm - Pager - [phone]  After 6pm go to [email or web address] - password [email or web address] [facility]  Office  [phone]  CC: Primary care physician; [name], MD   Note: This dictation was prepared with Dragon dictation along with smaller phrase technology. Any transcriptional errors that result from this process are unintentional.

## 2018-06-07 NOTE — Progress Notes (Signed)
New referral for outpatient Palliative to follow at [facility] post discharge. Patient to discharge today. Patient information faxed to referral. Patient to discharge today. Flo Shanks RN, BSN, [facility] [phone]

## 2018-06-07 NOTE — Progress Notes (Signed)
Patient noted to be discharging today to [facility]. Orders placed for PC at SNF. I have also talked to Dr. Holley Raring regarding possible nerve block. Ambulatory referral placed. Per Dr. Grayland Ormond, patient would be okay to temporarily stop anticoagulation in order to proceed with nerve block if necessary.   No Charge  Time Total: 10 minutes  Visit consisted of counseling and education dealing with the complex and emotionally intense issues of symptom management and palliative care in the setting of serious and potentially life-threatening illness.Greater than 50%  of this time was spent counseling and coordinating care related to the above assessment and plan.  Signed by: Altha Harm, PhD, NP-C [phone] (Work Cell)

## 2018-06-08 ENCOUNTER — Other Ambulatory Visit: Payer: Medicare Other

## 2018-06-09 DIAGNOSIS — M6281 Muscle weakness (generalized): Secondary | ICD-10-CM | POA: Diagnosis not present

## 2018-06-09 DIAGNOSIS — I110 Hypertensive heart disease with heart failure: Secondary | ICD-10-CM | POA: Diagnosis not present

## 2018-06-09 DIAGNOSIS — C7989 Secondary malignant neoplasm of other specified sites: Secondary | ICD-10-CM | POA: Diagnosis not present

## 2018-06-09 DIAGNOSIS — I82402 Acute embolism and thrombosis of unspecified deep veins of left lower extremity: Secondary | ICD-10-CM | POA: Diagnosis not present

## 2018-06-09 DIAGNOSIS — C548 Malignant neoplasm of overlapping sites of corpus uteri: Secondary | ICD-10-CM | POA: Diagnosis not present

## 2018-06-09 DIAGNOSIS — M79662 Pain in left lower leg: Secondary | ICD-10-CM | POA: Diagnosis not present

## 2018-06-09 DIAGNOSIS — E039 Hypothyroidism, unspecified: Secondary | ICD-10-CM | POA: Diagnosis not present

## 2018-06-13 ENCOUNTER — Ambulatory Visit: Payer: Medicare Other | Admitting: Oncology

## 2018-06-14 ENCOUNTER — Non-Acute Institutional Stay: Payer: Medicare Other | Admitting: Primary Care

## 2018-06-14 ENCOUNTER — Other Ambulatory Visit: Payer: Self-pay | Admitting: *Deleted

## 2018-06-14 DIAGNOSIS — Z86718 Personal history of other venous thrombosis and embolism: Secondary | ICD-10-CM | POA: Diagnosis not present

## 2018-06-14 DIAGNOSIS — Z515 Encounter for palliative care: Secondary | ICD-10-CM

## 2018-06-14 DIAGNOSIS — R208 Other disturbances of skin sensation: Secondary | ICD-10-CM | POA: Diagnosis not present

## 2018-06-14 DIAGNOSIS — G893 Neoplasm related pain (acute) (chronic): Secondary | ICD-10-CM

## 2018-06-14 MED ORDER — ALENDRONATE SODIUM 70 MG PO TABS: 70.0000 mg | ORAL_TABLET | ORAL | 1 refills | Status: DC

## 2018-06-14 NOTE — Progress Notes (Signed)
[facility] Consult Note Telephone: [phone]  Fax: [phone]  PATIENT NAME: Vickie Barton DOB: 02-04-1931 MRN: [id number]  PRIMARY CARE PROVIDER:   Juluis Pitch, MD  REFERRING PROVIDER: Juluis Pitch, MD [phone] [place] [address]   RESPONSIBLE PARTY:   Extended Emergency Contact Information Primary Emergency Contact: [facility] Address: [address]          [place], [place] [address] Johnnette Litter of [place] Phone: [phone] Mobile Phone: [phone] Relation: Daughter  ASSESSMENT and RECOMMENDATIONS:   1. Bowel function: Continue to monitor for constipation as opioid doses increase.  States no bm x 1 week but staff endorses loose stools.   2. Pain management: Recommend upward titration of narcotics based on PRN use, add gabapentin 100 mg tid. Recommend changing to 20 mg oxycontin q 8 hrs plus 10 mg oxycodone every 1 hour prn pain until pain is controlled. Review amount of PRN pain medication used in 48 hours, divide in half and add this amount to extended release daily dosages in 3 divided doses. On narcotics for LE pain but not relieved and patient mobility is affected. As tumor grows I feel this pain will only increase. Patient would benefit from aggressive pain management lest she have poor quality of life and poor function. Currently on 20 mg oxycontin q 8 hrs with 15 mg oxycodone q 4 hrs break through pain.Consideration for the high cost of oxycontin should also be made. Morphine sulfate extended release is a more cost effective alternative.   3. Goal of Care: Full code at this time, will speak with POA to see if patient has discussed her wishes. Patient states desire to get stronger so she can walk to the bathroom and return home. She however can only stand with help at this time. She states she would like to go home but she would not have toileting assistance there. Discussed getting hired helpers,  family for care.  However, recent imaging has shown an increase in the tumor. Consideration of hospice services is appropriate at this time if family and patient would like to elect this benefit.  I will reach out to pt's daughter to discuss her ideas for goals of care, and possible disposition scenarios.  Palliative care to follow for goals of care clarification and symptom / pain management. Return 1 week or prn.  I spent 45 minutes providing this consultation,  from 1030 to 1115. More than 50% of the time in this consultation was spent coordinating communication.   HISTORY OF PRESENT ILLNESS:  Vickie Barton is a 85 y.o. year old female with multiple medical problems including uterine cancer, DVT left leg, HTN, hypothyroid, OA, CHF. Palliative Care was asked to help address goals of care.   CODE STATUS: FULL  PPS: 40% - weak  HOSPICE ELIGIBILITY/DIAGNOSIS: TBD  PAST MEDICAL HISTORY:  Past Medical History:  Diagnosis Date   Arthritis    CHF (congestive heart failure) (HCC)    Constipation    DVT (deep venous thrombosis) ([facility]) 03/2017   left leg   Endometrial cancer ([facility]) 01/28/2016   Hysterectomy, chemo + rad tx's, also internal brachytherapy on 02/2018.    Hyperlipidemia    Hypertension    Hypothyroidism     SOCIAL HX:  Social History   Tobacco Use   Smoking status: Former Smoker   Smokeless tobacco: Never Used  Substance Use Topics   Alcohol use: No    ALLERGIES:  Allergies  Allergen  Reactions   Sulfa Antibiotics Rash   Zinc Rash     PERTINENT MEDICATIONS:  Outpatient Encounter Medications as of 06/14/2018  Medication Sig   alendronate (FOSAMAX) 70 MG tablet Take 1 tablet (70 mg total) by mouth once a week. Take with a full glass of water on an empty stomach.   ALPRAZolam (XANAX) 0.25 MG tablet Take 1 tablet (0.25 mg total) by mouth 2 (two) times daily as needed for anxiety.   aspirin EC 81 MG EC tablet Take 1 tablet (81 mg total) by mouth daily.    atorvastatin (LIPITOR) 10 MG tablet Take 1 tablet (10 mg total) by mouth daily at 6 PM.   Calcium Carbonate-Vitamin D (CALCIUM-D) 600-400 MG-UNIT TABS Take 1 tablet by mouth 1 day or 1 dose for 1 dose.   docusate sodium (COLACE) 100 MG capsule Take 1 capsule (100 mg total) by mouth 2 (two) times daily.   ELIQUIS 5 MG TABS tablet TAKE 2 TABLETS BY MOUTH TWICE A DAY   levothyroxine (SYNTHROID, LEVOTHROID) 150 MCG tablet TAKE 150 MCG BY MOUTH DAILY BEFORE BREAKFAST ON EMPTY STOMACH WITH A GLASS OF WATER 30-60MINS BEFORE BREAKFAST   metoprolol tartrate (LOPRESSOR) 25 MG tablet Take 0.5 tablets (12.5 mg total) by mouth 2 (two) times daily.   ondansetron (ZOFRAN) 8 MG tablet Take 1 tablet (8 mg total) by mouth every 8 (eight) hours as needed for nausea or vomiting.   oxyCODONE (OXYCONTIN) 20 mg 12 hr tablet Take 1 tablet (20 mg total) by mouth every 8 (eight) hours.   oxyCODONE (ROXICODONE) 15 MG immediate release tablet Take 1 tablet (15 mg total) by mouth every 4 (four) hours as needed for moderate pain, severe pain or breakthrough pain (for breakthrough pain).   senna (SENOKOT) 8.6 MG TABS tablet Take 1 tablet (8.6 mg total) by mouth daily.   No facility-administered encounter medications on file as of 06/14/2018.     PHYSICAL EXAM:  Vs 135 LB 97.6-68-18 , 95% oxygen on room air BP deferred, SNF taking vital signs General: NAD, frail appearing, WNWD Cardiovascular: regular rate and rhythm, S1S2 Pulmonary: clear all fields, no cough Abdomen: soft, nontender, + bowel sounds, endorses no bm x 1 week Extremities: no edema, left leg swollen, patient endorses electric pain on touch of left leg, perhaps due to impinging tumor. Neurological: Weakness, able to transfer with assistance,  allodynia as above. Questionable historian  Cyndia Skeeters DNP, AGPCNP-BC

## 2018-06-15 ENCOUNTER — Telehealth: Payer: Self-pay | Admitting: Primary Care

## 2018-06-15 DIAGNOSIS — I110 Hypertensive heart disease with heart failure: Secondary | ICD-10-CM | POA: Diagnosis not present

## 2018-06-15 DIAGNOSIS — E039 Hypothyroidism, unspecified: Secondary | ICD-10-CM | POA: Diagnosis not present

## 2018-06-15 DIAGNOSIS — I82401 Acute embolism and thrombosis of unspecified deep veins of right lower extremity: Secondary | ICD-10-CM | POA: Diagnosis not present

## 2018-06-15 DIAGNOSIS — M6281 Muscle weakness (generalized): Secondary | ICD-10-CM | POA: Diagnosis not present

## 2018-06-15 DIAGNOSIS — D649 Anemia, unspecified: Secondary | ICD-10-CM | POA: Diagnosis not present

## 2018-06-15 DIAGNOSIS — C548 Malignant neoplasm of overlapping sites of corpus uteri: Secondary | ICD-10-CM | POA: Diagnosis not present

## 2018-06-15 DIAGNOSIS — C7989 Secondary malignant neoplasm of other specified sites: Secondary | ICD-10-CM | POA: Diagnosis not present

## 2018-06-15 DIAGNOSIS — M79662 Pain in left lower leg: Secondary | ICD-10-CM | POA: Diagnosis not present

## 2018-06-15 NOTE — Telephone Encounter (Signed)
T/c to discuss plan of care after speaking with cancer center about pain. As she will not be able to have a consultation for several weeks, my plan is  To discuss more aggressive pain management with her POA and then reach the SNF MD to discuss. Left message with call back number.

## 2018-06-16 ENCOUNTER — Other Ambulatory Visit: Payer: Self-pay | Admitting: Oncology

## 2018-06-17 ENCOUNTER — Other Ambulatory Visit: Payer: Self-pay

## 2018-06-17 ENCOUNTER — Non-Acute Institutional Stay: Payer: Medicare Other | Admitting: Primary Care

## 2018-06-17 DIAGNOSIS — Z515 Encounter for palliative care: Secondary | ICD-10-CM

## 2018-06-17 DIAGNOSIS — G893 Neoplasm related pain (acute) (chronic): Secondary | ICD-10-CM | POA: Diagnosis not present

## 2018-06-17 DIAGNOSIS — R208 Other disturbances of skin sensation: Secondary | ICD-10-CM

## 2018-06-17 DIAGNOSIS — Z86718 Personal history of other venous thrombosis and embolism: Secondary | ICD-10-CM | POA: Diagnosis not present

## 2018-06-17 DIAGNOSIS — C541 Malignant neoplasm of endometrium: Secondary | ICD-10-CM

## 2018-06-17 NOTE — Progress Notes (Signed)
[facility] Consult Note Telephone: [phone]  Fax: [phone]  PATIENT NAME: Vickie Barton DOB: 1930-10-17 MRN: [id number]  PRIMARY CARE PROVIDER:   Juluis Pitch, MD  REFERRING PROVIDER:  Juluis Pitch, MD [phone] [place], [place] [address]  RESPONSIBLE PARTY:   Extended Emergency Contact Information Primary Emergency Contact: [facility] Address: [address]          [place], [place] [address] Johnnette Litter of [place] Phone: [phone] Mobile Phone: [phone] Relation: Daughter   ASSESSMENT and RECOMMENDATIONS:   1. Pain: Continue to treat chronic constant pain. Will remain on current regimen for pain per facility MD and DON.  I discussed pain management again with the patient, Patient states she wants to decrease use of pain medication. We however reviewed the importance of treating this cancer pain so she can participate in and maximize her rehabilitation work while at [facility]. She voices understanding that her pain impacts her function and said she wants to improve function. I feel some of the pain she previously reported was related to constipation.  I spoke with cancer center NP J Borders who states he is arranging a consultation with Dr. Charlean Sanfilippo.He is seeking a nerve block for the left leg tumor pain.  The time is TBD but he states he will follow up to see where this stands.Will continue to monitor for recommendation.   2. Constipation: Daily miralax and senna titrated to soft formed bm, Milk of Magnesia or similar  prn. Patient states she had had no BM for a long time and took several doses of milk of magnesia. She was able to have some good results and evacuate a large amount of stool but feels there is more. We discussed how she cannot get to the bathroom . OTA entered during our visit and we discussed use of a BSC with OTA. OTA will f/u with obtaining BSC so pt. Can use toilet more normally.  I encouraged her  to continue to ask for prn laxatives to keep her bowels going at a minimum every other day, and that a formed soft bm without strain was her goal. She stated she feels much better after the bm.   3. Goals of Care: MOST form present with DNR, comfort care only. Patient voices her goal is to return home after the completion of her therapy, and asked me what services are available to help her at home. We discussed options including home health, long term care/skilled care, and hospice, and  home DME. We did discuss payment options, not in great detail but broadly. We discussed hospice at home or in a facility, and the purpose of inpatient hospice. She states she was told her cancer was not able to be  treated further, and that hospice was suggested. She is going to discuss with her daughter over the weekend RE planning for her care after rehabilitation. Patient asked that I visit on Monday and discuss this with her and have her daughter on the phone. I can certainly include daughter by phone and will f/u Monday to discuss.   [facility] Palliative medicine  to follow for goals of care clarification, symptom management. Return 3/30 or prn.   I spent 35 minutes providing this consultation,  from 1200 to 1235. More than 50% of the time in this consultation was spent coordinating communication.   HISTORY OF PRESENT ILLNESS:  Vickie Barton is a 85 y.o. year old female with multiple medical problems including sepsis,  uterine cancer, pain, DVT left leg, HTN, hypothyroid, OA, CHF. Palliative Care was asked to help address goals of care.   CODE STATUS: DNR, MOST with comfort care measures, DNH, no IV, Abx or feeding tube.   PPS: 30% HOSPICE ELIGIBILITY/DIAGNOSIS: TBD  PAST MEDICAL HISTORY:  Past Medical History:  Diagnosis Date  . Arthritis   . CHF (congestive heart failure) ([facility])   . Constipation   . DVT (deep venous thrombosis) ([facility]) 03/2017   left leg  . Endometrial cancer ([facility]) 01/28/2016    Hysterectomy, chemo + rad tx's, also internal brachytherapy on 02/2018.   [name] Hyperlipidemia   . Hypertension   . Hypothyroidism     SOCIAL HX:  Social History   Tobacco Use  . Smoking status: Former Research scientist (life sciences)  . Smokeless tobacco: Never Used  Substance Use Topics  . Alcohol use: No    ALLERGIES:  Allergies  Allergen Reactions  . Sulfa Antibiotics Rash  . Zinc Rash     PERTINENT MEDICATIONS:  Outpatient Encounter Medications as of 06/17/2018  Medication Sig  . alendronate (FOSAMAX) 70 MG tablet TAKE 1 TABLET BY MOUTH ONCE A WEEK. TAKE WITH A FULL GLASS OF WATER ON AN EMPTY STOMACH.  [name] ALPRAZolam (XANAX) 0.25 MG tablet Take 1 tablet (0.25 mg total) by mouth 2 (two) times daily as needed for anxiety.  [name] aspirin EC 81 MG EC tablet Take 1 tablet (81 mg total) by mouth daily.  [name] atorvastatin (LIPITOR) 10 MG tablet Take 1 tablet (10 mg total) by mouth daily at 6 PM.  . Calcium Carbonate-Vitamin D (CALCIUM-D) 600-400 MG-UNIT TABS Take 1 tablet by mouth 1 day or 1 dose for 1 dose.  . docusate sodium (COLACE) 100 MG capsule Take 1 capsule (100 mg total) by mouth 2 (two) times daily.  [name] ELIQUIS 5 MG TABS tablet TAKE 2 TABLETS BY MOUTH TWICE A DAY  . levothyroxine (SYNTHROID, LEVOTHROID) 150 MCG tablet TAKE 150 MCG BY MOUTH DAILY BEFORE BREAKFAST ON EMPTY STOMACH WITH A GLASS OF WATER 30-60MINS BEFORE BREAKFAST  . metoprolol tartrate (LOPRESSOR) 25 MG tablet Take 0.5 tablets (12.5 mg total) by mouth 2 (two) times daily.  . ondansetron (ZOFRAN) 8 MG tablet Take 1 tablet (8 mg total) by mouth every 8 (eight) hours as needed for nausea or vomiting.  [name] oxyCODONE (OXYCONTIN) 20 mg 12 hr tablet Take 1 tablet (20 mg total) by mouth every 8 (eight) hours.  [name] oxyCODONE (ROXICODONE) 15 MG immediate release tablet Take 1 tablet (15 mg total) by mouth every 4 (four) hours as needed for moderate pain, severe pain or breakthrough pain (for breakthrough pain).  [name] senna (SENOKOT) 8.6 MG TABS tablet Take 1 tablet  (8.6 mg total) by mouth daily.   No facility-administered encounter medications on file as of 06/17/2018.     PHYSICAL EXAM:   General: NAD, frail appearing, thin Cardiovascular: denies chest pain Pulmonary: denies cough Abdomen: endorses constipation, fair appetite.Recent large bm Extremities: Right no  edema, left leg painful 2/2 tumor in left groin, with  + edema. Skin: no rashes Neurological: Weakness, fatigue, some forgetfulness at times.  Cyndia Skeeters DNP, AGPCNP-BC

## 2018-06-20 ENCOUNTER — Telehealth: Payer: Self-pay | Admitting: Primary Care

## 2018-06-20 NOTE — Telephone Encounter (Signed)
T/c to daughter Vivien Rota, as patient cell phone was not working. Patient had asked for me to phone daughter during our visit today which had to be rescheduled when telemedicine capability is found at [facility]. Discussed prognosis and possible disposition after nursing home skilled plan of care concludes. Discussed remaining at facility, costs of that, Medicaid application, hiring help at home, hospice at home. Discussed inpatient hospice indication and that currently she may not be hospice home eligible but was hospice eligible. Discussed how oncology practice had already discussed hospice services. Discussed that home hospice would come to meet with them after patient goes home and would create a care plan with their input. Options give and discussed. Daughter is uncertain as to her course. I encouraged her to discuss with the facility SNF and we would be happy to provide transition for hospice admission. Invited daughter to call with any questions about the upcoming care transition. Will reach out in approx. 1 week for d/c plan from SNF.

## 2018-06-23 ENCOUNTER — Telehealth: Payer: Self-pay | Admitting: Primary Care

## 2018-06-23 ENCOUNTER — Ambulatory Visit: Payer: Medicare Other

## 2018-06-23 NOTE — Telephone Encounter (Signed)
T/c to DON at [facility] to explain telemedicine will be mode of visits. Message left. T/c to Vickie Barton at [facility], no answer in room and no message delivery available. Will call back to assess for telemedicine and discuss d/c planning.

## 2018-06-24 ENCOUNTER — Other Ambulatory Visit: Payer: Self-pay

## 2018-06-24 ENCOUNTER — Non-Acute Institutional Stay: Payer: Medicare Other | Admitting: Primary Care

## 2018-06-24 DIAGNOSIS — R208 Other disturbances of skin sensation: Secondary | ICD-10-CM

## 2018-06-24 DIAGNOSIS — Z515 Encounter for palliative care: Secondary | ICD-10-CM

## 2018-06-24 DIAGNOSIS — C541 Malignant neoplasm of endometrium: Secondary | ICD-10-CM

## 2018-06-24 NOTE — Progress Notes (Signed)
[facility] Consult Note Telephone: [phone]  Fax: [phone]   Telephone Evaluation Statement Due to the [place]- 19 crisis, this telephone evaluation and treatment contact was done via telephone and it was initiated and consented by this patient or family .  PATIENT NAME: Vickie Barton DOB: 1930/07/06 MRN: [id number]  PRIMARY CARE PROVIDER:   [name], MD  REFERRING PROVIDER:  Juluis Pitch, MD [phone] [address] [place], [place] [address]  RESPONSIBLE PARTY:   Extended Emergency Contact Information Primary Emergency Contact: [facility] Address: [address]          [place], [place] [address] Johnnette Litter of [place] Phone: [phone] Mobile Phone: [phone] Relation: Daughter   ASSESSMENT and RECOMMENDATIONS:   1.Goals of Care:  Requests ongoing palliative care once home, d/c from SNF on 06/27/2018. DNR, MOST with comfort care, DNH, do not use IV, Abx, Feeding tube. Patient agreed to phone call meeting in lieu of visit due to the [place] 19 situation. She states she is going home on Monday to her home. I talked with Mrs. Epple's daughter earlier this week whereby she stated this was the likely disposition. Mrs. Peregoy states she can now ambulate to the bathroom, toilet herself, feed and bathe herself and ambulated down the hall in the SNF. She denies pain that interferes with movement. She wants to continue PT at home. I encouraged her to talk with SW at SNF to be sure HHA referral had be made. She also states need to have BSC in case she cannot always get to bath room, also encouraged her to ask facility SW to order this as her discharge plan. Patient also asked for continued following at home if PCP would request consultation.   Patient requests communicated to Howard, RN, DON by phone call. Cancer Center at [facility] also apprised of d/c plan.  I spent 11 minutes providing this consultation,  from 13:45 to 13:56.  More than 50% of the time in this consultation was spent coordinating communication.   HISTORY OF PRESENT ILLNESS:  Vickie Barton is a 85 y.o. year old female with multiple medical problems including sepsis, uterine cancer, pain, DVT left leg, HTN, hypothyroid, OA, CHF. Palliative Care was asked to help address goals of care.   CODE STATUS: DNR, MOST with comfort care measures, DNH, no IV, Abx or feeding tube.   PPS: 40% HOSPICE ELIGIBILITY/DIAGNOSIS: yes/uterine cancer with metastases PAST MEDICAL HISTORY:  Past Medical History:  Diagnosis Date  . Arthritis   . CHF (congestive heart failure) ([facility])   . Constipation   . DVT (deep venous thrombosis) ([facility]) 03/2017   left leg  . Endometrial cancer ([facility]) 01/28/2016   Hysterectomy, chemo + rad tx's, also internal brachytherapy on 02/2018.   [name] Hyperlipidemia   . Hypertension   . Hypothyroidism     SOCIAL HX:  Social History   Tobacco Use  . Smoking status: Former Research scientist (life sciences)  . Smokeless tobacco: Never Used  Substance Use Topics  . Alcohol use: No    ALLERGIES:  Allergies  Allergen Reactions  . Sulfa Antibiotics Rash  . Zinc Rash     PERTINENT MEDICATIONS:  Outpatient Encounter Medications as of 06/24/2018  Medication Sig  . alendronate (FOSAMAX) 70 MG tablet TAKE 1 TABLET BY MOUTH ONCE A WEEK. TAKE WITH A FULL GLASS OF WATER ON AN EMPTY STOMACH.  [name] ALPRAZolam (XANAX) 0.25 MG tablet Take 1 tablet (0.25 mg total) by mouth 2 (two) times daily  as needed for anxiety.  [name] aspirin EC 81 MG EC tablet Take 1 tablet (81 mg total) by mouth daily.  [name] atorvastatin (LIPITOR) 10 MG tablet Take 1 tablet (10 mg total) by mouth daily at 6 PM.  . Calcium Carbonate-Vitamin D (CALCIUM-D) 600-400 MG-UNIT TABS Take 1 tablet by mouth 1 day or 1 dose for 1 dose.  . docusate sodium (COLACE) 100 MG capsule Take 1 capsule (100 mg total) by mouth 2 (two) times daily.  [name] ELIQUIS 5 MG TABS tablet TAKE 2 TABLETS BY MOUTH TWICE A DAY  . levothyroxine  (SYNTHROID, LEVOTHROID) 150 MCG tablet TAKE 150 MCG BY MOUTH DAILY BEFORE BREAKFAST ON EMPTY STOMACH WITH A GLASS OF WATER 30-60MINS BEFORE BREAKFAST  . metoprolol tartrate (LOPRESSOR) 25 MG tablet Take 0.5 tablets (12.5 mg total) by mouth 2 (two) times daily.  . ondansetron (ZOFRAN) 8 MG tablet Take 1 tablet (8 mg total) by mouth every 8 (eight) hours as needed for nausea or vomiting.  [name] oxyCODONE (OXYCONTIN) 20 mg 12 hr tablet Take 1 tablet (20 mg total) by mouth every 8 (eight) hours.  [name] oxyCODONE (ROXICODONE) 15 MG immediate release tablet Take 1 tablet (15 mg total) by mouth every 4 (four) hours as needed for moderate pain, severe pain or breakthrough pain (for breakthrough pain).  [name] senna (SENOKOT) 8.6 MG TABS tablet Take 1 tablet (8.6 mg total) by mouth daily.   No facility-administered encounter medications on file as of 06/24/2018.     ROS: Provided per patient.  General:  +well being, USOH Cardiovascular: denies cp Pulmonary: denies cough Abdomen: endorses continence, regularity. Extremities: endorses improving mobility. Skin: denies lesions Neurological: Weakness , endorses improvement in mobility, toileting and adls.  Estevan Oaks DNP, AGPCNP-BC

## 2018-06-26 DIAGNOSIS — C7989 Secondary malignant neoplasm of other specified sites: Secondary | ICD-10-CM | POA: Diagnosis not present

## 2018-06-26 DIAGNOSIS — I110 Hypertensive heart disease with heart failure: Secondary | ICD-10-CM | POA: Diagnosis not present

## 2018-06-26 DIAGNOSIS — K5900 Constipation, unspecified: Secondary | ICD-10-CM | POA: Diagnosis not present

## 2018-06-26 DIAGNOSIS — E039 Hypothyroidism, unspecified: Secondary | ICD-10-CM | POA: Diagnosis not present

## 2018-06-26 DIAGNOSIS — M79662 Pain in left lower leg: Secondary | ICD-10-CM | POA: Diagnosis not present

## 2018-06-26 DIAGNOSIS — I82401 Acute embolism and thrombosis of unspecified deep veins of right lower extremity: Secondary | ICD-10-CM | POA: Diagnosis not present

## 2018-06-26 DIAGNOSIS — Z66 Do not resuscitate: Secondary | ICD-10-CM | POA: Diagnosis not present

## 2018-06-26 DIAGNOSIS — M6281 Muscle weakness (generalized): Secondary | ICD-10-CM | POA: Diagnosis not present

## 2018-06-26 DIAGNOSIS — C548 Malignant neoplasm of overlapping sites of corpus uteri: Secondary | ICD-10-CM | POA: Diagnosis not present

## 2018-06-27 ENCOUNTER — Encounter: Payer: Medicare Other | Admitting: Hospice and Palliative Medicine

## 2018-06-27 ENCOUNTER — Ambulatory Visit: Payer: Medicare Other | Admitting: Oncology

## 2018-06-28 DIAGNOSIS — I5022 Chronic systolic (congestive) heart failure: Secondary | ICD-10-CM | POA: Diagnosis not present

## 2018-06-28 DIAGNOSIS — K5909 Other constipation: Secondary | ICD-10-CM | POA: Diagnosis not present

## 2018-06-28 DIAGNOSIS — E039 Hypothyroidism, unspecified: Secondary | ICD-10-CM | POA: Diagnosis not present

## 2018-06-28 DIAGNOSIS — D630 Anemia in neoplastic disease: Secondary | ICD-10-CM | POA: Diagnosis not present

## 2018-06-28 DIAGNOSIS — Z9181 History of falling: Secondary | ICD-10-CM | POA: Diagnosis not present

## 2018-06-28 DIAGNOSIS — F064 Anxiety disorder due to known physiological condition: Secondary | ICD-10-CM | POA: Diagnosis not present

## 2018-06-28 DIAGNOSIS — G893 Neoplasm related pain (acute) (chronic): Secondary | ICD-10-CM | POA: Diagnosis not present

## 2018-06-28 DIAGNOSIS — Z87440 Personal history of urinary (tract) infections: Secondary | ICD-10-CM | POA: Diagnosis not present

## 2018-06-28 DIAGNOSIS — E785 Hyperlipidemia, unspecified: Secondary | ICD-10-CM | POA: Diagnosis not present

## 2018-06-28 DIAGNOSIS — I110 Hypertensive heart disease with heart failure: Secondary | ICD-10-CM | POA: Diagnosis not present

## 2018-06-28 DIAGNOSIS — I252 Old myocardial infarction: Secondary | ICD-10-CM | POA: Diagnosis not present

## 2018-06-28 DIAGNOSIS — Z79891 Long term (current) use of opiate analgesic: Secondary | ICD-10-CM | POA: Diagnosis not present

## 2018-06-28 DIAGNOSIS — M1990 Unspecified osteoarthritis, unspecified site: Secondary | ICD-10-CM | POA: Diagnosis not present

## 2018-06-28 DIAGNOSIS — C562 Malignant neoplasm of left ovary: Secondary | ICD-10-CM | POA: Diagnosis not present

## 2018-06-28 DIAGNOSIS — Z86718 Personal history of other venous thrombosis and embolism: Secondary | ICD-10-CM | POA: Diagnosis not present

## 2018-06-28 DIAGNOSIS — Z87891 Personal history of nicotine dependence: Secondary | ICD-10-CM | POA: Diagnosis not present

## 2018-06-28 DIAGNOSIS — Z90710 Acquired absence of both cervix and uterus: Secondary | ICD-10-CM | POA: Diagnosis not present

## 2018-06-29 DIAGNOSIS — I252 Old myocardial infarction: Secondary | ICD-10-CM | POA: Diagnosis not present

## 2018-06-29 DIAGNOSIS — I5022 Chronic systolic (congestive) heart failure: Secondary | ICD-10-CM | POA: Diagnosis not present

## 2018-06-29 DIAGNOSIS — G893 Neoplasm related pain (acute) (chronic): Secondary | ICD-10-CM | POA: Diagnosis not present

## 2018-06-29 DIAGNOSIS — C562 Malignant neoplasm of left ovary: Secondary | ICD-10-CM | POA: Diagnosis not present

## 2018-06-29 DIAGNOSIS — D630 Anemia in neoplastic disease: Secondary | ICD-10-CM | POA: Diagnosis not present

## 2018-06-29 DIAGNOSIS — I110 Hypertensive heart disease with heart failure: Secondary | ICD-10-CM | POA: Diagnosis not present

## 2018-06-30 ENCOUNTER — Telehealth: Payer: Self-pay | Admitting: *Deleted

## 2018-06-30 NOTE — Telephone Encounter (Signed)
Call returned to patient who is not on Hospice care , but she is again being followed by Palliative Care at home as of today and she will call back for appointment at later date after Covid 19

## 2018-06-30 NOTE — Telephone Encounter (Signed)
Patient called asking what her status is with Dr Grayland Ormond with the Covid outbreak

## 2018-06-30 NOTE — Telephone Encounter (Signed)
That is appropriate. Thank you for the clarification.

## 2018-06-30 NOTE — Telephone Encounter (Signed)
I thinks Vickie Barton has talked to her recently. And I think she is on Hospice.  We are planning no f/u during COVID-19.

## 2018-07-01 DIAGNOSIS — G893 Neoplasm related pain (acute) (chronic): Secondary | ICD-10-CM | POA: Diagnosis not present

## 2018-07-01 DIAGNOSIS — I252 Old myocardial infarction: Secondary | ICD-10-CM | POA: Diagnosis not present

## 2018-07-01 DIAGNOSIS — D630 Anemia in neoplastic disease: Secondary | ICD-10-CM | POA: Diagnosis not present

## 2018-07-01 DIAGNOSIS — I5022 Chronic systolic (congestive) heart failure: Secondary | ICD-10-CM | POA: Diagnosis not present

## 2018-07-01 DIAGNOSIS — I110 Hypertensive heart disease with heart failure: Secondary | ICD-10-CM | POA: Diagnosis not present

## 2018-07-01 DIAGNOSIS — C562 Malignant neoplasm of left ovary: Secondary | ICD-10-CM | POA: Diagnosis not present

## 2018-07-01 NOTE — Telephone Encounter (Signed)
I think she was sent home from rehab with home health and home-based palliative care with plan to transition to hospice in the future (after home health). I can always do a telephone visit with her if needed to discuss symptoms or goals.

## 2018-07-01 NOTE — Telephone Encounter (Signed)
I think that would be good.  Thanks!

## 2018-07-04 ENCOUNTER — Telehealth: Payer: Self-pay

## 2018-07-04 DIAGNOSIS — D630 Anemia in neoplastic disease: Secondary | ICD-10-CM | POA: Diagnosis not present

## 2018-07-04 DIAGNOSIS — I5022 Chronic systolic (congestive) heart failure: Secondary | ICD-10-CM | POA: Diagnosis not present

## 2018-07-04 DIAGNOSIS — I110 Hypertensive heart disease with heart failure: Secondary | ICD-10-CM | POA: Diagnosis not present

## 2018-07-04 DIAGNOSIS — G893 Neoplasm related pain (acute) (chronic): Secondary | ICD-10-CM | POA: Diagnosis not present

## 2018-07-04 DIAGNOSIS — I252 Old myocardial infarction: Secondary | ICD-10-CM | POA: Diagnosis not present

## 2018-07-04 DIAGNOSIS — C562 Malignant neoplasm of left ovary: Secondary | ICD-10-CM | POA: Diagnosis not present

## 2018-07-04 NOTE — Telephone Encounter (Signed)
Patient contacted for Palliative Care visit.  Due to the current COVID-19 infection/crises, the patient and family prefer, and have given their verbal consent for, a provider visit via telemedicine. HIPPA policies of confidentially were discussed and patient/family expressed understanding. Visit scheduled for 07/11/2018 @ 11am

## 2018-07-05 DIAGNOSIS — D630 Anemia in neoplastic disease: Secondary | ICD-10-CM | POA: Diagnosis not present

## 2018-07-05 DIAGNOSIS — I110 Hypertensive heart disease with heart failure: Secondary | ICD-10-CM | POA: Diagnosis not present

## 2018-07-05 DIAGNOSIS — I252 Old myocardial infarction: Secondary | ICD-10-CM | POA: Diagnosis not present

## 2018-07-05 DIAGNOSIS — C562 Malignant neoplasm of left ovary: Secondary | ICD-10-CM | POA: Diagnosis not present

## 2018-07-05 DIAGNOSIS — I5022 Chronic systolic (congestive) heart failure: Secondary | ICD-10-CM | POA: Diagnosis not present

## 2018-07-05 DIAGNOSIS — G893 Neoplasm related pain (acute) (chronic): Secondary | ICD-10-CM | POA: Diagnosis not present

## 2018-07-06 DIAGNOSIS — I110 Hypertensive heart disease with heart failure: Secondary | ICD-10-CM | POA: Diagnosis not present

## 2018-07-06 DIAGNOSIS — D630 Anemia in neoplastic disease: Secondary | ICD-10-CM | POA: Diagnosis not present

## 2018-07-06 DIAGNOSIS — I252 Old myocardial infarction: Secondary | ICD-10-CM | POA: Diagnosis not present

## 2018-07-06 DIAGNOSIS — I5022 Chronic systolic (congestive) heart failure: Secondary | ICD-10-CM | POA: Diagnosis not present

## 2018-07-06 DIAGNOSIS — C562 Malignant neoplasm of left ovary: Secondary | ICD-10-CM | POA: Diagnosis not present

## 2018-07-06 DIAGNOSIS — G893 Neoplasm related pain (acute) (chronic): Secondary | ICD-10-CM | POA: Diagnosis not present

## 2018-07-07 DIAGNOSIS — I110 Hypertensive heart disease with heart failure: Secondary | ICD-10-CM | POA: Diagnosis not present

## 2018-07-07 DIAGNOSIS — G893 Neoplasm related pain (acute) (chronic): Secondary | ICD-10-CM | POA: Diagnosis not present

## 2018-07-07 DIAGNOSIS — D630 Anemia in neoplastic disease: Secondary | ICD-10-CM | POA: Diagnosis not present

## 2018-07-07 DIAGNOSIS — I5022 Chronic systolic (congestive) heart failure: Secondary | ICD-10-CM | POA: Diagnosis not present

## 2018-07-07 DIAGNOSIS — C562 Malignant neoplasm of left ovary: Secondary | ICD-10-CM | POA: Diagnosis not present

## 2018-07-07 DIAGNOSIS — I252 Old myocardial infarction: Secondary | ICD-10-CM | POA: Diagnosis not present

## 2018-07-11 ENCOUNTER — Other Ambulatory Visit: Payer: Self-pay

## 2018-07-11 ENCOUNTER — Other Ambulatory Visit: Payer: Medicare Other | Admitting: Nurse Practitioner

## 2018-07-11 DIAGNOSIS — D630 Anemia in neoplastic disease: Secondary | ICD-10-CM | POA: Diagnosis not present

## 2018-07-11 DIAGNOSIS — G893 Neoplasm related pain (acute) (chronic): Secondary | ICD-10-CM

## 2018-07-11 DIAGNOSIS — I110 Hypertensive heart disease with heart failure: Secondary | ICD-10-CM | POA: Diagnosis not present

## 2018-07-11 DIAGNOSIS — I252 Old myocardial infarction: Secondary | ICD-10-CM | POA: Diagnosis not present

## 2018-07-11 DIAGNOSIS — R531 Weakness: Secondary | ICD-10-CM | POA: Insufficient documentation

## 2018-07-11 DIAGNOSIS — I5022 Chronic systolic (congestive) heart failure: Secondary | ICD-10-CM | POA: Diagnosis not present

## 2018-07-11 DIAGNOSIS — Z515 Encounter for palliative care: Secondary | ICD-10-CM | POA: Diagnosis not present

## 2018-07-11 DIAGNOSIS — C562 Malignant neoplasm of left ovary: Secondary | ICD-10-CM | POA: Diagnosis not present

## 2018-07-11 NOTE — Progress Notes (Signed)
[facility] Consult Note Telephone: [phone]  Fax: [phone]  PATIENT NAME: Vickie Barton DOB: [birth year] MRN: [id number]  PRIMARY CARE PROVIDER:   [name], MD  REFERRING PROVIDER:  [name], MD [address] [facility] [place], [place] [address]  RESPONSIBLE PARTY:  Self  Due to the COVID-19 crisis, this visit was done via telemedicine from my office and it was initiated and consent by this patient and or family. I did a phone visit in place of Telehealth as Telehealth was not available with equipment difficulties.   RECOMMENDATIONS and PLAN:  1.Palliative care encounter Z51.5; Palliative medicine team will continue to support patient, patient's family, and medical team. Visit consisted of counseling and education dealing with the complex and emotionally intense issues of symptom management and palliative care in the setting of serious and potentially life-threatening illness  2. Generalized weakness R53.1  secondary to congestive heart failure, endometrial cancer continue with therapy as able. Encourage energy conservation and rest times.  3. Chronic pain G89.29 secondary to cancer will continue to monitor on pain scale, monitor efficacy vs adverse side effects  ASSESSMENT:   I called Vickie Barton. We talked about how she was feeling and she sure that she's doing much better. We talked about symptoms of pain what she says have been managed on Oxycodone. She talked about requiring refills. We discussed dosages as has been changed since discharged home by Dr. Lovie Macadamia. She is currently taking  Discussed at length difference between short-acting and long-acting pain medications with purpose and use. She sure that she is not having a lot of pain and typically one Oxycodone says keep her comfortable, effective pain relief. We talked about currently she is not taking OxyContin. She shared she is only taking it once  or twice since she's been home. We talked about pain regimen. She was going to follow up with doctor bronstein her primary care provider concerning refills and decrease in the amount of pain medication that she has been requiring since she has been at home. We talked about her appetite which she shares has dramatically improved. She has had no symptoms of nausea, vomiting and diarrhea has resolved. She has not been using any extra laxatives. She continues to work with physical therapy at home. She is able to walk with her walker what she does for safety purposes. She shared that she is getting strong enough to walk without her walker though she does not add a fear of falling. We talked about importance of safety. We talked about goals of care in at present time her wishes are to continue therapy and see how she feels with further discussion with oncology. We talked about role of palliative care and plan of care. Schedule they follow up palliative care Telehealth phone call in two weeks when therapy is completed. Therapeutic listening and emotional support provided. Questions answered satisfaction. Contact information provided.  Oxycodone 10 mg q6 hours as needed for breakthrough pain (she has been taking, received from Dr Lovie Macadamia d/c from [facility] but is also primary provider) Oxycodone 15mg  q4 hours as needed for breakthrough pain (she received at time of d/c hospital from Dr Grayland Ormond) Oxycontin 20 mg Q 8 hours scheduled (which she is not taking)  CT scan 2 / 28 / 2020 revealed large left pelvic sidewall mass with increase in size from previous study likely is exacerbated pain. Further progression of mass noted on CT scan 3 / 9 / 2020  and was admitted to the hospital 3 / 9 / 2020 with sepsis unclear source.  I spent 45 minutes providing this consultation,  from 11:00am  to 11:45am. More than 50% of the time in this consultation was spent coordinating communication.   HISTORY OF PRESENT ILLNESS:  Vickie Barton is a 85 y.o. year old female with multiple medical problems including Congestive heart failure, DVT 05/02/17, endometrial cancer 01/2016 with hysterectomy, chemo, radiation also internal Loletha Grayer therapy on 02/2018, hypothyroidism, hypertension, hyperlipidemia, arthritis, tonsillectomy, TEE without cardioversion, laparoscopic bilateral salpingooophorectomy. Cystoscopy with ureteral stent placement. Hospitalized 3 / 9 / 2022 3 / 17 / 2024 weakness, nausea, vomiting, diarrhea. Workup significant for sepsis unknown etiology managed in ICU with broad-spectrum antibiotics vancomycin and cefepime. She had TEE done with no vegetation seen by infectious disease and fevers likely secondary to underlying malignancy. Concerned for bone erosion or vaginal extension recommend transvaginal ultrasound with bilateral lower extremity venous Doppler to rule out new DVT. She does have a history of DVT for which she is on anticoagulation for. She is not a candidate for any chemotherapy now for underlying malignancy confirmed by oncology. Should she improve in the future she can follow with cancer center to arrange for therapy. Non-st elevated myocardial infarction on Heparin drip with 2D Echo EF 30 to 35% with cardiomyopathy. Cardiology confirm not a candidate for cardiac catheterization due to anemia and unable to position patient secondary to severe pains. She is also not candidate for cardiac rehab due to significant cancer-related pain. Statin was added. Chronic congestive heart failure systolic stable no ACE inhibitors due to blood pressure issues. Hyperkalemia resolve. Acute diarrhea self-limiting c.difficile was negative. Endometrial novarian malignancy followed by oncology given to climb performance status with multiple medical problems including cardiac she currently is not a candidate for additional chemotherapy. She continued to Eliquis wants transition home. Anemia etiology unclear hemoglobin stable upon discharge 7.6.  Lower extremity pain worse in wind moves around likely due to compression from pelvic malignancy from Progressive endometrial malignancy. Currently she does not want hospice involvement and wishes to go to rehab upon discharge. She did receive pain medications including IV Dilaudid. Discussion of a nerve block with Interventional radiologist four possible procedure buy anesthesia though they did not do this procedure. Palliative care contacted pain control clinic and they do not do procedure as impatient. She was followed by palliative care during hospitalization. She is a widow, lives with her daughter. She did have a son who died in May 02, 2018 from cancer. 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 She is a DNR. Poor documentation she had severe left lower quadrant pain and left groin pain likely from large pelvic Mass growing in size with repeated Imaging. She was trying on MS Contin but discontinued when she started having Progressive weakness thinking that was the cause. She was agreeable to trying a new long-acting opioid. She has received most relief from oxycodone PRN which is short-lived. Started OxyContin. she was having severe diarrhea during hospitalization and daughter was concerned possibly taking a large amount of laxatives. Code status was discussed and she would not want to prolong her life artificially nor be resuscitated and DNR. Palliative care to further discuss goals of care after discharge home. She was discharged to [facility] for short-term rehab then home to continue PT / OT at home. She now is currently at home. Palliative Care was asked to help address goals of care.  CODE STATUS: DNR  PPS: 50% HOSPICE ELIGIBILITY/DIAGNOSIS: TBD  PAST MEDICAL HISTORY:  Past Medical History:  Diagnosis Date   Arthritis    CHF (congestive heart failure) ([place])    Constipation    DVT (deep venous thrombosis) ([place]) 03/2017   left  leg   Endometrial cancer ([place]) 01/28/2016   Hysterectomy, chemo + rad tx's, also internal brachytherapy on 02/2018.    Hyperlipidemia    Hypertension    Hypothyroidism     SOCIAL HX:  Social History   Tobacco Use   Smoking status: Former Smoker   Smokeless tobacco: Never Used  Substance Use Topics   Alcohol use: No    ALLERGIES:  Allergies  Allergen Reactions   Sulfa Antibiotics Rash   Zinc Rash     PERTINENT MEDICATIONS:  Outpatient Encounter Medications as of 07/11/2018  Medication Sig   alendronate (FOSAMAX) 70 MG tablet TAKE 1 TABLET BY MOUTH ONCE A WEEK. TAKE WITH A FULL GLASS OF WATER ON AN EMPTY STOMACH.   ALPRAZolam (XANAX) 0.25 MG tablet Take 1 tablet (0.25 mg total) by mouth 2 (two) times daily as needed for anxiety.   aspirin EC 81 MG EC tablet Take 1 tablet (81 mg total) by mouth daily.   atorvastatin (LIPITOR) 10 MG tablet Take 1 tablet (10 mg total) by mouth daily at 6 PM.   Calcium Carbonate-Vitamin D (CALCIUM-D) 600-400 MG-UNIT TABS Take 1 tablet by mouth 1 day or 1 dose for 1 dose.   docusate sodium (COLACE) 100 MG capsule Take 1 capsule (100 mg total) by mouth 2 (two) times daily.   ELIQUIS 5 MG TABS tablet TAKE 2 TABLETS BY MOUTH TWICE A DAY   levothyroxine (SYNTHROID, LEVOTHROID) 150 MCG tablet TAKE 150 MCG BY MOUTH DAILY BEFORE BREAKFAST ON EMPTY STOMACH WITH A GLASS OF WATER 30-60MINS BEFORE BREAKFAST   metoprolol tartrate (LOPRESSOR) 25 MG tablet Take 0.5 tablets (12.5 mg total) by mouth 2 (two) times daily.   ondansetron (ZOFRAN) 8 MG tablet Take 1 tablet (8 mg total) by mouth every 8 (eight) hours as needed for nausea or vomiting.   oxyCODONE (OXYCONTIN) 20 mg 12 hr tablet Take 1 tablet (20 mg total) by mouth every 8 (eight) hours.   oxyCODONE (ROXICODONE) 15 MG immediate release tablet Take 1 tablet (15 mg total) by mouth every 4 (four) hours as needed for moderate pain, severe pain or breakthrough pain (for breakthrough pain).    senna (SENOKOT) 8.6 MG TABS tablet Take 1 tablet (8.6 mg total) by mouth daily.   No facility-administered encounter medications on file as of 07/11/2018.     PHYSICAL EXAM:  Deferred  Ruffin Lada Z Timmia Cogburn, NP

## 2018-07-12 DIAGNOSIS — I5022 Chronic systolic (congestive) heart failure: Secondary | ICD-10-CM | POA: Diagnosis not present

## 2018-07-12 DIAGNOSIS — D630 Anemia in neoplastic disease: Secondary | ICD-10-CM | POA: Diagnosis not present

## 2018-07-12 DIAGNOSIS — C562 Malignant neoplasm of left ovary: Secondary | ICD-10-CM | POA: Diagnosis not present

## 2018-07-12 DIAGNOSIS — G893 Neoplasm related pain (acute) (chronic): Secondary | ICD-10-CM | POA: Diagnosis not present

## 2018-07-12 DIAGNOSIS — I252 Old myocardial infarction: Secondary | ICD-10-CM | POA: Diagnosis not present

## 2018-07-12 DIAGNOSIS — I110 Hypertensive heart disease with heart failure: Secondary | ICD-10-CM | POA: Diagnosis not present

## 2018-07-13 ENCOUNTER — Other Ambulatory Visit: Payer: Self-pay | Admitting: *Deleted

## 2018-07-13 DIAGNOSIS — G893 Neoplasm related pain (acute) (chronic): Secondary | ICD-10-CM

## 2018-07-13 DIAGNOSIS — C541 Malignant neoplasm of endometrium: Secondary | ICD-10-CM

## 2018-07-13 MED ORDER — OXYCODONE HCL 10 MG PO TABS: 10.0000 mg | ORAL_TABLET | ORAL | 0 refills | Status: DC | PRN

## 2018-07-13 NOTE — Telephone Encounter (Signed)
Patient called requesting refill of her Oxycodone 10 mg. I checked with her regarding dosing as her chart shows Oxy ER 20 mg and Oxy IR15 mg. She states she is no longer taking the ER or the 15 mg doses since she left rehab.

## 2018-07-14 DIAGNOSIS — I5022 Chronic systolic (congestive) heart failure: Secondary | ICD-10-CM | POA: Diagnosis not present

## 2018-07-14 DIAGNOSIS — I110 Hypertensive heart disease with heart failure: Secondary | ICD-10-CM | POA: Diagnosis not present

## 2018-07-14 DIAGNOSIS — C562 Malignant neoplasm of left ovary: Secondary | ICD-10-CM | POA: Diagnosis not present

## 2018-07-14 DIAGNOSIS — I252 Old myocardial infarction: Secondary | ICD-10-CM | POA: Diagnosis not present

## 2018-07-14 DIAGNOSIS — G893 Neoplasm related pain (acute) (chronic): Secondary | ICD-10-CM | POA: Diagnosis not present

## 2018-07-14 DIAGNOSIS — D630 Anemia in neoplastic disease: Secondary | ICD-10-CM | POA: Diagnosis not present

## 2018-07-19 DIAGNOSIS — D630 Anemia in neoplastic disease: Secondary | ICD-10-CM | POA: Diagnosis not present

## 2018-07-19 DIAGNOSIS — I252 Old myocardial infarction: Secondary | ICD-10-CM | POA: Diagnosis not present

## 2018-07-19 DIAGNOSIS — I5022 Chronic systolic (congestive) heart failure: Secondary | ICD-10-CM | POA: Diagnosis not present

## 2018-07-19 DIAGNOSIS — I110 Hypertensive heart disease with heart failure: Secondary | ICD-10-CM | POA: Diagnosis not present

## 2018-07-19 DIAGNOSIS — G893 Neoplasm related pain (acute) (chronic): Secondary | ICD-10-CM | POA: Diagnosis not present

## 2018-07-19 DIAGNOSIS — C562 Malignant neoplasm of left ovary: Secondary | ICD-10-CM | POA: Diagnosis not present

## 2018-07-21 DIAGNOSIS — G893 Neoplasm related pain (acute) (chronic): Secondary | ICD-10-CM | POA: Diagnosis not present

## 2018-07-21 DIAGNOSIS — I5022 Chronic systolic (congestive) heart failure: Secondary | ICD-10-CM | POA: Diagnosis not present

## 2018-07-21 DIAGNOSIS — D630 Anemia in neoplastic disease: Secondary | ICD-10-CM | POA: Diagnosis not present

## 2018-07-21 DIAGNOSIS — I110 Hypertensive heart disease with heart failure: Secondary | ICD-10-CM | POA: Diagnosis not present

## 2018-07-21 DIAGNOSIS — C562 Malignant neoplasm of left ovary: Secondary | ICD-10-CM | POA: Diagnosis not present

## 2018-07-21 DIAGNOSIS — I252 Old myocardial infarction: Secondary | ICD-10-CM | POA: Diagnosis not present

## 2018-07-25 ENCOUNTER — Other Ambulatory Visit: Payer: Medicare Other | Admitting: Nurse Practitioner

## 2018-07-25 ENCOUNTER — Other Ambulatory Visit: Payer: Self-pay

## 2018-07-25 ENCOUNTER — Encounter: Payer: Self-pay | Admitting: Nurse Practitioner

## 2018-07-25 DIAGNOSIS — R531 Weakness: Secondary | ICD-10-CM | POA: Diagnosis not present

## 2018-07-25 DIAGNOSIS — Z515 Encounter for palliative care: Secondary | ICD-10-CM

## 2018-07-25 DIAGNOSIS — G893 Neoplasm related pain (acute) (chronic): Secondary | ICD-10-CM | POA: Diagnosis not present

## 2018-07-25 NOTE — Progress Notes (Signed)
[facility] Consult Note Telephone: [phone]  Fax: [phone]  PATIENT NAME: Vickie Barton DOB: 04-10-1930 MRN: [id number]  PRIMARY CARE PROVIDER:   [name], MD  REFERRING PROVIDER:  [name], MD [address] [facility] [place], [place] [address]  RESPONSIBLE PARTY:   Self  Due to the COVID-19 crisis, this visit was done via telemedicine from my office and it was initiated and consent by this patient and or family  RECOMMENDATIONS and PLAN:  1.Palliative care encounter Z51.5; Palliative medicine team will continue to support patient, patient's family, and medical team. Visit consisted of counseling and education dealing with the complex and emotionally intense issues of symptom management and palliative care in the setting of serious and potentially life-threatening illness  2. Generalized weaknessR53.1  secondary to congestive heart failure, endometrial cancer continue with therapy as able. Encourage energy conservation and rest times.  3. Chronic pain G89.29 secondary to cancer will continue to monitor on pain scale, monitor efficacy vs adverse side effects  ASSESSMENT:     I did a video Telehealth call to Ms Lopata and her daughter Vivien Rota. We talked about purpose of palliative care visit. We talked about how she was feeling today. She shared that she is doing well. She has been complaining about pain in her me intermittently. She shared that with weather changes it gets worse. She does take oxycodone typically one a day at the most to with effective pain relief. She said that her appetite is improving. She said that she is getting stronger with therapy. She has no concerns or complaints today. We talked about upcoming appointment with Dr Donella Stade radiation oncologist. We talked about medical goals of care. She shared if further treatment options are available and she continues to feel good as she does she would  proceed on with treatments. We talked about role of palliative care and plan of care. Discussed that will follow up palliative care visit after her does it with Dr Donella Stade. She was very thankful for palliative care Telehealth visit. Therapeutic listening and emotional support provided. Contact information provided. Questions answered satisfaction.  CT scan 2 / 28 / 2020 revealed large left pelvic sidewall mass with increase in size from previous study likely is exacerbated pain. Further progression of mass noted on CT scan 3 / 9 / 2020 and was admitted to the hospital 3 / 9 / 2020 with sepsis unclear source   I spent 30 minutes providing this consultation,  from 3:00pm to 3:30pm. More than 50% of the time in this consultation was spent coordinating communication.   HISTORY OF PRESENT ILLNESS:  [name] is a 85 y.o. year old female with multiple medical problems including Congestive heart failure, DVT 03/2017, endometrial cancer 01/2016 with hysterectomy, chemo, radiation also internal Loletha Grayer therapy on 02/2018, hypothyroidism, hypertension, hyperlipidemia, arthritis, tonsillectomy, TEE without cardioversion, laparoscopic bilateral salpingooophorectomy. Cystoscopy with ureteral stent placement. Hospitalized 3 / 9 / 2020 to 3 / 17 / 2020 to weakness, nausea, vomiting, diarrhea. Workup significant for sepsis unknown etiology managed in ICU with broad-spectrum antibiotics vancomycin and cefepime.. Non-st elevated myocardial infarction, 2D Echo EF 30 to 35% with cardiomyopathy. Cardiology confirm not a candidate for cardiac catheterization due to anemia and unable to position patient secondary to severe pains. She is also not candidate for cardiac rehab due to significant cancer-related pain. She is not a candidate for any chemotherapy now for underlying malignancy confirmed by oncology. Should she improve in  the future she can follow with cancer center to arrange for therapy. Palliative Care was asked to  help to continue to address goals of care.   CODE STATUS: 50%  PPS: DNR HOSPICE ELIGIBILITY/DIAGNOSIS: TBD  PAST MEDICAL HISTORY:  Past Medical History:  Diagnosis Date  . Arthritis   . CHF (congestive heart failure) ([place])   . Constipation   . DVT (deep venous thrombosis) ([place]) 03/2017   left leg  . Endometrial cancer ([place]) 01/28/2016   Hysterectomy, chemo + rad tx's, also internal brachytherapy on 02/2018.   [name] Hyperlipidemia   . Hypertension   . Hypothyroidism     SOCIAL HX:  Social History   Tobacco Use  . Smoking status: Former Research scientist (life sciences)  . Smokeless tobacco: Never Used  Substance Use Topics  . Alcohol use: No    ALLERGIES:  Allergies  Allergen Reactions  . Sulfa Antibiotics Rash  . Zinc Rash     PERTINENT MEDICATIONS:  Outpatient Encounter Medications as of 07/25/2018  Medication Sig  . alendronate (FOSAMAX) 70 MG tablet TAKE 1 TABLET BY MOUTH ONCE A WEEK. TAKE WITH A FULL GLASS OF WATER ON AN EMPTY STOMACH.  [name] ALPRAZolam (XANAX) 0.25 MG tablet Take 1 tablet (0.25 mg total) by mouth 2 (two) times daily as needed for anxiety.  [name] aspirin EC 81 MG EC tablet Take 1 tablet (81 mg total) by mouth daily.  [name] atorvastatin (LIPITOR) 10 MG tablet Take 1 tablet (10 mg total) by mouth daily at 6 PM.  . Calcium Carbonate-Vitamin D (CALCIUM-D) 600-400 MG-UNIT TABS Take 1 tablet by mouth 1 day or 1 dose for 1 dose.  . docusate sodium (COLACE) 100 MG capsule Take 1 capsule (100 mg total) by mouth 2 (two) times daily.  [name] ELIQUIS 5 MG TABS tablet TAKE 2 TABLETS BY MOUTH TWICE A DAY  . levothyroxine (SYNTHROID, LEVOTHROID) 150 MCG tablet TAKE 150 MCG BY MOUTH DAILY BEFORE BREAKFAST ON EMPTY STOMACH WITH A GLASS OF WATER 30-60MINS BEFORE BREAKFAST  . metoprolol tartrate (LOPRESSOR) 25 MG tablet Take 0.5 tablets (12.5 mg total) by mouth 2 (two) times daily.  . ondansetron (ZOFRAN) 8 MG tablet Take 1 tablet (8 mg total) by mouth every 8 (eight) hours as needed for nausea or vomiting.  .  Oxycodone HCl 10 MG TABS Take 1 tablet (10 mg total) by mouth every 4 (four) hours as needed.  . senna (SENOKOT) 8.6 MG TABS tablet Take 1 tablet (8.6 mg total) by mouth daily.   No facility-administered encounter medications on file as of 07/25/2018.     PHYSICAL EXAM:   Deferred  Christin Z Gusler, NP

## 2018-07-27 DIAGNOSIS — G893 Neoplasm related pain (acute) (chronic): Secondary | ICD-10-CM | POA: Diagnosis not present

## 2018-07-27 DIAGNOSIS — C562 Malignant neoplasm of left ovary: Secondary | ICD-10-CM | POA: Diagnosis not present

## 2018-07-27 DIAGNOSIS — I252 Old myocardial infarction: Secondary | ICD-10-CM | POA: Diagnosis not present

## 2018-07-27 DIAGNOSIS — I110 Hypertensive heart disease with heart failure: Secondary | ICD-10-CM | POA: Diagnosis not present

## 2018-07-27 DIAGNOSIS — D630 Anemia in neoplastic disease: Secondary | ICD-10-CM | POA: Diagnosis not present

## 2018-07-27 DIAGNOSIS — I5022 Chronic systolic (congestive) heart failure: Secondary | ICD-10-CM | POA: Diagnosis not present

## 2018-08-02 ENCOUNTER — Other Ambulatory Visit: Payer: Self-pay | Admitting: *Deleted

## 2018-08-02 MED ORDER — OXYCODONE HCL 10 MG PO TABS: 10.0000 mg | ORAL_TABLET | ORAL | 0 refills | Status: DC | PRN

## 2018-08-03 ENCOUNTER — Other Ambulatory Visit: Payer: Self-pay | Admitting: Oncology

## 2018-08-10 ENCOUNTER — Ambulatory Visit: Payer: Medicare Other | Admitting: Radiation Oncology

## 2018-08-12 ENCOUNTER — Other Ambulatory Visit: Payer: Self-pay | Admitting: *Deleted

## 2018-08-12 MED ORDER — LETROZOLE 2.5 MG PO TABS: 2.5000 mg | ORAL_TABLET | Freq: Every day | ORAL | 3 refills | Status: DC

## 2018-08-17 ENCOUNTER — Other Ambulatory Visit: Payer: Medicare Other | Admitting: Nurse Practitioner

## 2018-08-17 ENCOUNTER — Encounter: Payer: Self-pay | Admitting: Nurse Practitioner

## 2018-08-17 ENCOUNTER — Ambulatory Visit: Payer: Medicare Other | Admitting: Oncology

## 2018-08-17 ENCOUNTER — Other Ambulatory Visit: Payer: Self-pay

## 2018-08-17 DIAGNOSIS — G893 Neoplasm related pain (acute) (chronic): Secondary | ICD-10-CM

## 2018-08-17 DIAGNOSIS — R630 Anorexia: Secondary | ICD-10-CM | POA: Diagnosis not present

## 2018-08-17 DIAGNOSIS — Z515 Encounter for palliative care: Secondary | ICD-10-CM | POA: Diagnosis not present

## 2018-08-17 DIAGNOSIS — R531 Weakness: Secondary | ICD-10-CM

## 2018-08-17 NOTE — Progress Notes (Signed)
[facility] Consult Note Telephone: [phone]  Fax: [phone]  PATIENT NAME: Vickie Barton DOB: [birth year] MRN: [id number]  PRIMARY CARE PROVIDER:   [name], MD  REFERRING PROVIDER:  [name], MD [address] [facility] [place], [place] [address]  RESPONSIBLE PARTY:   Self  Due to the COVID-19 crisis, this visit was done via telemedicine from my office and it was initiated and consent by this patient and or family. I did a phone visit in place of Telehealth as Telehealth was not available with equipment difficulties.   Covid screen negative  RECOMMENDATIONS and PLAN:  1.Palliative care encounter Z51.5; Palliative medicine team will continue to support patient, patient's family, and medical team. Visit consisted of counseling and education dealing with the complex and emotionally intense issues of symptom management and palliative care in the setting of serious and potentially life-threatening illness  2. Generalized weaknessR53.1  secondary to congestive heart failure, endometrial cancer continue with therapy as able. Encourage energy conservation and rest times.  3. Chronic pain G89.29 secondary to cancer will continue to monitor on pain scale, monitor efficacy vs adverse side effects  4. Anorexia R63.0 but appetite remaining improved. Continue to encourage supplements and comfort feedings. Discuss nutrition  ASSESSMENT:     I called Vickie Barton or follow-up palliative care consult. Vickie Barton was in agreement. We talked about how she was feeling today and she shared that she is doing well. We talked about symptoms of pain what she does continue to experience in her leg but Oxycodone 5 mg typically helps. She under normal they she does take one but if it is cold and rainy she finds that she may have to take two. She does get full pain relief with this pain regiment. She denies shortness of breath. She  denies any nausea, vomiting, diarrhea. Appetite has been improving. She shared that she has been getting stronger walking with her walker and able to walk without. We talked about fall risk as she has had no recent calls. She talked about being able to sleep well at night. This is an improvement. Vickie. Barton endorses that she restarted Letrozole yesterday taking at bedtime. She had a lot of side effects the last time she attempted to take Letrozole but she feels that it could have been an interaction of all the medication she had as well as being sick at the time. She did notify Dr Grayland Ormond and it was refilled for 1 month and she also has a follow-up appointment with Dr Donella Stade. Vickie. Barton talked about pet scan results and read impression to her. Questions answered. We talked about medical goals of care and she wishes to continue to try to do what she can and hopes of the cancer decreasing in size. She wants to continue to keep her appointment with Dr Donella Stade at the end of the month in June for further discussions of options. She does remain a DNR. We talked about quality of life. We talked about role of palliative care and plan of care. We talked about follow-up appointment in 4 weeks and Vickie Barton in agreement. Appointment scheduled. Questions answered to satisfaction. Contact information provided. Therapeutic listening and emotional support provided.  05/27/2018; Pet scan  IMPRESSION: 1. Interval decrease in size cystic and solid mass in the LEFT adnexa. Hypermetabolic activity along the lateral margin of this lesion suggest a small focus of residual carcinoma. 2. No new or progressive malignancy in the pelvis.  3. No evidence of metastatic disease outside the pelvis. 4. Mild metabolic activity associated mediastinal lymph nodes is favored reactive.  I spent 45 minutes providing this consultation,  from 1:00pm to 1:45pm. More than 50% of the time in this consultation was spent coordinating  communication.   HISTORY OF PRESENT ILLNESS:  Vickie Barton is a 85 y.o. year old female with multiple medical problems including Congestive heart failure, DVT 03/2017, endometrial cancer 01/2016 with hysterectomy, chemo, radiation also internal Loletha Grayer therapy on 02/2018, hypothyroidism, hypertension, hyperlipidemia, arthritis, tonsillectomy, TEE without cardioversion, laparoscopic bilateral salpingooophorectomy. Cystoscopy with ureteral stent placement. Palliative Care was asked to help to continue to address goals of care.   CODE STATUS: DNR  PPS: 50% HOSPICE ELIGIBILITY/DIAGNOSIS: TBD  PAST MEDICAL HISTORY:  Past Medical History:  Diagnosis Date  . Arthritis   . CHF (congestive heart failure) ([place])   . Constipation   . DVT (deep venous thrombosis) ([place]) 03/2017   left leg  . Endometrial cancer ([place]) 01/28/2016   Hysterectomy, chemo + rad tx's, also internal brachytherapy on 02/2018.   [name] Hyperlipidemia   . Hypertension   . Hypothyroidism     SOCIAL HX:  Social History   Tobacco Use  . Smoking status: Former Research scientist (life sciences)  . Smokeless tobacco: Never Used  Substance Use Topics  . Alcohol use: No    ALLERGIES:  Allergies  Allergen Reactions  . Sulfa Antibiotics Rash  . Zinc Rash     PERTINENT MEDICATIONS:  Outpatient Encounter Medications as of 08/17/2018  Medication Sig  . alendronate (FOSAMAX) 70 MG tablet TAKE 1 TABLET BY MOUTH ONCE A WEEK. TAKE WITH A FULL GLASS OF WATER ON AN EMPTY STOMACH.  [name] ALPRAZolam (XANAX) 0.25 MG tablet Take 1 tablet (0.25 mg total) by mouth 2 (two) times daily as needed for anxiety.  [name] aspirin EC 81 MG EC tablet Take 1 tablet (81 mg total) by mouth daily.  [name] atorvastatin (LIPITOR) 10 MG tablet Take 1 tablet (10 mg total) by mouth daily at 6 PM.  . Calcium Carbonate-Vitamin D (CALCIUM-D) 600-400 MG-UNIT TABS Take 1 tablet by mouth 1 day or 1 dose for 1 dose.  . docusate sodium (COLACE) 100 MG capsule Take 1 capsule (100 mg total) by mouth 2 (two)  times daily.  [name] ELIQUIS 5 MG TABS tablet TAKE 2 TABLETS BY MOUTH TWICE A DAY  . letrozole (FEMARA) 2.5 MG tablet Take 1 tablet (2.5 mg total) by mouth daily.  [name] levothyroxine (SYNTHROID, LEVOTHROID) 150 MCG tablet TAKE 150 MCG BY MOUTH DAILY BEFORE BREAKFAST ON EMPTY STOMACH WITH A GLASS OF WATER 30-60MINS BEFORE BREAKFAST  . metoprolol tartrate (LOPRESSOR) 25 MG tablet Take 0.5 tablets (12.5 mg total) by mouth 2 (two) times daily.  . ondansetron (ZOFRAN) 8 MG tablet Take 1 tablet (8 mg total) by mouth every 8 (eight) hours as needed for nausea or vomiting.  . Oxycodone HCl 10 MG TABS Take 1 tablet (10 mg total) by mouth every 4 (four) hours as needed.  . senna (SENOKOT) 8.6 MG TABS tablet Take 1 tablet (8.6 mg total) by mouth daily.   No facility-administered encounter medications on file as of 08/17/2018.     PHYSICAL EXAM:   Deferred  Christin Z Gusler, NP

## 2018-08-18 ENCOUNTER — Inpatient Hospital Stay: Payer: Medicare Other | Admitting: Oncology

## 2018-08-22 ENCOUNTER — Ambulatory Visit: Payer: Medicare Other | Admitting: Oncology

## 2018-08-22 ENCOUNTER — Other Ambulatory Visit: Payer: Self-pay | Admitting: *Deleted

## 2018-08-22 MED ORDER — OXYCODONE HCL 10 MG PO TABS: 10.0000 mg | ORAL_TABLET | ORAL | 0 refills | Status: DC | PRN

## 2018-09-08 ENCOUNTER — Other Ambulatory Visit: Payer: Self-pay | Admitting: *Deleted

## 2018-09-08 MED ORDER — OXYCODONE HCL 10 MG PO TABS: 10.0000 mg | ORAL_TABLET | ORAL | 0 refills | Status: DC | PRN

## 2018-09-19 ENCOUNTER — Ambulatory Visit: Payer: Medicare Other | Admitting: Radiation Oncology

## 2018-09-22 ENCOUNTER — Telehealth: Payer: Self-pay | Admitting: Nurse Practitioner

## 2018-09-22 DIAGNOSIS — I10 Essential (primary) hypertension: Secondary | ICD-10-CM | POA: Diagnosis not present

## 2018-09-22 DIAGNOSIS — D649 Anemia, unspecified: Secondary | ICD-10-CM | POA: Diagnosis not present

## 2018-09-22 DIAGNOSIS — E785 Hyperlipidemia, unspecified: Secondary | ICD-10-CM | POA: Diagnosis not present

## 2018-09-22 NOTE — Telephone Encounter (Signed)
I called Vickie Barton for scheduled PC f/u visit, no answer, message left with contact information.

## 2018-09-23 ENCOUNTER — Telehealth: Payer: Self-pay | Admitting: Nurse Practitioner

## 2018-09-23 NOTE — Telephone Encounter (Signed)
Called daughter Vivien Rota to reschedule the 7/2 f/u visit, due to no answer when NP called yesterday, message left with reason for call along with my name and contact information.

## 2018-09-23 NOTE — Telephone Encounter (Signed)
Returned patient's call and we have rescheduled the 7/2 Palliative telephone f/u visit to 10/04/18 @ 12 Noon.

## 2018-09-29 ENCOUNTER — Other Ambulatory Visit: Payer: Self-pay | Admitting: *Deleted

## 2018-09-29 MED ORDER — OXYCODONE HCL 10 MG PO TABS: 10.0000 mg | ORAL_TABLET | ORAL | 0 refills | Status: DC | PRN

## 2018-09-29 NOTE — Telephone Encounter (Signed)
Patient called [facility] requesting refill of oxycodone.   As mandated by the [place] STOP Act (Strengthen Opioid Misuse Prevention), the [place] Controlled Substance Reporting System ([place]) was reviewed for this patient.  Below is the past 71-months of controlled substance prescriptions as displayed by the registry.  I have personally consulted with my supervising physician, Dr. Grayland Ormond, who agrees that continuation of opiate therapy is medically appropriate at this time and agrees to provide continual monitoring, including urine/blood drug screens, as indicated. Prescription sent electronically using Imprivata secure transmission to requested pharmacy.   [place] Reviewed & Updated PDMP review in Epic.   Beckey Rutter, DNP, AGNP-C [place] at [facility] [phone] (work cell) [phone] (office)

## 2018-10-03 DIAGNOSIS — D649 Anemia, unspecified: Secondary | ICD-10-CM | POA: Diagnosis not present

## 2018-10-03 DIAGNOSIS — Z0000 Encounter for general adult medical examination without abnormal findings: Secondary | ICD-10-CM | POA: Diagnosis not present

## 2018-10-03 DIAGNOSIS — L89629 Pressure ulcer of left heel, unspecified stage: Secondary | ICD-10-CM | POA: Diagnosis not present

## 2018-10-03 DIAGNOSIS — E785 Hyperlipidemia, unspecified: Secondary | ICD-10-CM | POA: Diagnosis not present

## 2018-10-03 DIAGNOSIS — R600 Localized edema: Secondary | ICD-10-CM | POA: Diagnosis not present

## 2018-10-03 DIAGNOSIS — I10 Essential (primary) hypertension: Secondary | ICD-10-CM | POA: Diagnosis not present

## 2018-10-03 DIAGNOSIS — E039 Hypothyroidism, unspecified: Secondary | ICD-10-CM | POA: Diagnosis not present

## 2018-10-04 ENCOUNTER — Encounter: Payer: Self-pay | Admitting: Nurse Practitioner

## 2018-10-04 ENCOUNTER — Other Ambulatory Visit: Payer: Self-pay

## 2018-10-04 ENCOUNTER — Other Ambulatory Visit: Payer: Medicare Other | Admitting: Nurse Practitioner

## 2018-10-04 DIAGNOSIS — Z515 Encounter for palliative care: Secondary | ICD-10-CM

## 2018-10-04 DIAGNOSIS — G893 Neoplasm related pain (acute) (chronic): Secondary | ICD-10-CM | POA: Diagnosis not present

## 2018-10-04 DIAGNOSIS — R630 Anorexia: Secondary | ICD-10-CM | POA: Diagnosis not present

## 2018-10-04 NOTE — Progress Notes (Signed)
[facility] Consult Note Telephone: [phone]  Fax: [phone]  PATIENT NAME: Vickie Barton DOB: [birth year] MRN: [id number]  PRIMARY CARE PROVIDER:   [name], MD  REFERRING PROVIDER:  [name], MD [address] [facility] [place],  [place] [address]  RESPONSIBLE PARTY:   Self  Due to the COVID-19 crisis, this visit was done via telemedicine from my office and it was initiated and consent by this patient and or family.I did a phone visit in place of Telehealth as Telehealth was not available with equipment difficulties.  Covid screen negative  RECOMMENDATIONS and PLAN: 1.Palliative care encounter Z51.5; Palliative medicine team will continue to support patient, patient's family, and medical team. Visit consisted of counseling and education dealing with the complex and emotionally intense issues of symptom management and palliative care in the setting of serious and potentially life-threatening illness  2.Generalized weaknessR53.1 secondary tocongestive heart failure, endometrial cancerimproving; Encourage energy conservation and rest times.  3.Chronic pain G89.29secondary tocancerwill continue to monitor on pain scale, monitor efficacy vs adverse side effects; currently being controlled with Oxycodone  4. Anorexia R63.0 improving. Continue to encourage supplements and comfort feedings. Discuss nutrition  ASSESSMENT:     I called [name] for schedule palliative care follow-up visit. We talked about purpose of palliative care visit and [name] in agreement. We talked about how she is feeling today. [name] endorses that she's feeling great. She shared that she is doing much better. She's eating three meals a day with snacks. She is getting some weight. She does have a wound to her left heel which she has a scheduled Podiatry appointment coming up for. She shared that it was a size of an  orange and now she has gotten it down to the size of a dime. She does continue to have difficulty wearing any shoes so she wants to have it resolved so she can go on a trip the end August to [place] to nieces home. She would have to wear a shoe and currently she just wears a sock as it's uncomfortable to put a shoe on. She does continue to take oxycodone for pain 5 mg / 24 hours. Typically she does take one a day. She shares that it does keep her pain at bay at time she does have to take 10 mg/24-hours. She shared that it doesn't always completely take the pain away but it is manageable. We talked about the pain regiment and that she is able to take 5mg  four hours. Discuss that pain medication is ordered for her comfort. She was in agreement. We talked about if she finds that she is requiring Oxycodone 20 mg / daily then to recontact and we will revisit pain management. We talked about shortness of breath what she is not experiencing. She does have intermittent edema in that left lower leg but she believes it could be from that ulcer. She shared that she did have a blood blister to come up on her upper arm possibly from Eliquis that was suggested from Dr. Kary Kos. She had her annual appointment with Dr. Kary Kos yesterday and he shared that she was doing great and next visit would be in one year. She was very excited about that. Dr. Kary Kos recommended following up with Dr. Grayland Ormond about the Eliquis. [name] asked if palliative care contact Dr Gary Fleet about Eliquis, message sent to Dr. Grayland Ormond to see if he wanted to continue current dose or decrease. Blood  blister is healing. We talked about her over on nutritional status and praise her for eating three meals a day and snacks. We talked about her weight gain which very good. We talked about her mobility as she is able to walk in her home without her walker. She does take her walker when she does go out in the community. We talked about fall risk. She  endorses that she is very careful. We talked about medical goals of care and cleaning aggressive versus conservative vs comfort care. We talked about code status as she is a DNR. She does not have a DNR form in her home nor is one documented in [place]. We talked about Epic /Vynca and she was an agreement for Palliative to complete a new DNR form and mail to her, download to [employer] [place]. We talked role of palliative care and plan of care. We scheduled a follow-up visit for 4 weeks for check-in, continuing to monitor as she continues to take Letrozole without side effects. [name] an agreement with scheduling follow-up palliative care visit. Therapeutic listening and emotional support provided. Contact information. Questions answered to satisfaction.  Message received from Dr Grayland Ormond to continue current dose of Eliquis and notify if further problems. I contacted Ms. Hata to update on Eliquis, message left with contact information to return call.   I spent 45 minutes providing this consultation,  from 12:00pm  to 12:45pm. More than 50% of the time in this consultation was spent coordinating communication.   HISTORY OF PRESENT ILLNESS:  Vickie Barton is a 85 y.o. year old female with multiple medical problems including Congestive heart failure, DVT 03/2017, endometrial cancer 01/2016 with hysterectomy, chemo, radiation also internal Loletha Grayer therapy on 02/2018, hypothyroidism, hypertension, hyperlipidemia, arthritis, tonsillectomy, TEE without cardioversion, laparoscopic bilateral salpingooophorectomy. Cystoscopy with ureteral stent placement. Palliative Care was asked to help to continue to address goals of care.   CODE STATUS: DNR  PPS: 60% HOSPICE ELIGIBILITY/DIAGNOSIS: TBD  PAST MEDICAL HISTORY:  Past Medical History:  Diagnosis Date  . Arthritis   . CHF (congestive heart failure) ([place])   . Constipation   . DVT (deep venous thrombosis) ([place]) 03/2017   left leg  . Endometrial cancer  ([place]) 01/28/2016   Hysterectomy, chemo + rad tx's, also internal brachytherapy on 02/2018.   [name] Hyperlipidemia   . Hypertension   . Hypothyroidism     SOCIAL HX:  Social History   Tobacco Use  . Smoking status: Former Research scientist (life sciences)  . Smokeless tobacco: Never Used  Substance Use Topics  . Alcohol use: No    ALLERGIES:  Allergies  Allergen Reactions  . Sulfa Antibiotics Rash  . Zinc Rash     PERTINENT MEDICATIONS:  Outpatient Encounter Medications as of 10/04/2018  Medication Sig  . alendronate (FOSAMAX) 70 MG tablet TAKE 1 TABLET BY MOUTH ONCE A WEEK. TAKE WITH A FULL GLASS OF WATER ON AN EMPTY STOMACH.  [name] ALPRAZolam (XANAX) 0.25 MG tablet Take 1 tablet (0.25 mg total) by mouth 2 (two) times daily as needed for anxiety.  [name] aspirin EC 81 MG EC tablet Take 1 tablet (81 mg total) by mouth daily.  [name] atorvastatin (LIPITOR) 10 MG tablet Take 1 tablet (10 mg total) by mouth daily at 6 PM.  . Calcium Carbonate-Vitamin D (CALCIUM-D) 600-400 MG-UNIT TABS Take 1 tablet by mouth 1 day or 1 dose for 1 dose.  . docusate sodium (COLACE) 100 MG capsule Take 1 capsule (100 mg total) by mouth 2 (two)  times daily.  [name] ELIQUIS 5 MG TABS tablet TAKE 2 TABLETS BY MOUTH TWICE A DAY  . letrozole (FEMARA) 2.5 MG tablet Take 1 tablet (2.5 mg total) by mouth daily.  [name] levothyroxine (SYNTHROID, LEVOTHROID) 150 MCG tablet TAKE 150 MCG BY MOUTH DAILY BEFORE BREAKFAST ON EMPTY STOMACH WITH A GLASS OF WATER 30-60MINS BEFORE BREAKFAST  . metoprolol tartrate (LOPRESSOR) 25 MG tablet Take 0.5 tablets (12.5 mg total) by mouth 2 (two) times daily.  . ondansetron (ZOFRAN) 8 MG tablet Take 1 tablet (8 mg total) by mouth every 8 (eight) hours as needed for nausea or vomiting.  . Oxycodone HCl 10 MG TABS Take 1 tablet (10 mg total) by mouth every 4 (four) hours as needed.  . senna (SENOKOT) 8.6 MG TABS tablet Take 1 tablet (8.6 mg total) by mouth daily.   No facility-administered encounter medications on file as of 10/04/2018.      PHYSICAL EXAM:   Deferred   Z , NP

## 2018-10-05 ENCOUNTER — Telehealth: Payer: Self-pay | Admitting: Nurse Practitioner

## 2018-10-05 NOTE — Telephone Encounter (Signed)
Vickie Barton returned call and updated on Dr Gary Fleet recommendation to continue current dose of Eliquis. Notify if any changes or problems noted. Vickie Barton verbalized understanding

## 2018-10-17 ENCOUNTER — Other Ambulatory Visit: Payer: Self-pay | Admitting: *Deleted

## 2018-10-17 DIAGNOSIS — G893 Neoplasm related pain (acute) (chronic): Secondary | ICD-10-CM

## 2018-10-17 NOTE — Telephone Encounter (Signed)
Refill oxycodone and make an appointment within the next 4 weeks.  Thanks!

## 2018-10-17 NOTE — Telephone Encounter (Signed)
Patient called asking for refill of her Oxycodone. She has no follow up appts with Dr Grayland Ormond and was last seen in January. She cancelled her 5/28 appointment and physician cancelled June apt and it never got rescheduled. Please advise

## 2018-10-18 MED ORDER — OXYCODONE HCL 10 MG PO TABS: 10.0000 mg | ORAL_TABLET | ORAL | 0 refills | Status: DC | PRN

## 2018-10-20 ENCOUNTER — Other Ambulatory Visit: Payer: Self-pay | Admitting: *Deleted

## 2018-10-20 DIAGNOSIS — G893 Neoplasm related pain (acute) (chronic): Secondary | ICD-10-CM

## 2018-10-20 MED ORDER — OXYCODONE HCL 10 MG PO TABS: 10.0000 mg | ORAL_TABLET | ORAL | 0 refills | Status: DC | PRN

## 2018-10-20 NOTE — Telephone Encounter (Signed)
Patient called [facility] requesting refill of oxycodone.   As mandated by the [place] STOP Act (Strengthen Opioid Misuse Prevention), the [place] Controlled Substance Reporting System ([place]) was reviewed for this patient and appears appropriate for refill. Per Dr. Grayland Ormond, continuation of opiate therapy is  medically appropriate at this time and he agrees to provide continual monitoring, including urine/blood drug screens, as indicated. Prescription sent electronically using Imprivata secure transmission to requested pharmacy.   [place] Reviewed & PDMP updated in Epic.   Beckey Rutter, DNP, AGNP-C [place] at [facility] [phone] (work cell) [phone] (office)

## 2018-10-20 NOTE — Telephone Encounter (Signed)
The prescription approved the other day printed and was not sent to pharmacy

## 2018-11-07 ENCOUNTER — Other Ambulatory Visit: Payer: Self-pay | Admitting: *Deleted

## 2018-11-07 DIAGNOSIS — G893 Neoplasm related pain (acute) (chronic): Secondary | ICD-10-CM

## 2018-11-07 MED ORDER — OXYCODONE HCL 10 MG PO TABS: 10.0000 mg | ORAL_TABLET | ORAL | 0 refills | Status: DC | PRN

## 2018-11-07 NOTE — Telephone Encounter (Signed)
Patient asking if she can take tylenol with her pain medicine or not. Please advise

## 2018-11-08 ENCOUNTER — Encounter: Payer: Self-pay | Admitting: Nurse Practitioner

## 2018-11-08 ENCOUNTER — Other Ambulatory Visit: Payer: Medicare Other | Admitting: Nurse Practitioner

## 2018-11-08 ENCOUNTER — Other Ambulatory Visit: Payer: Self-pay

## 2018-11-08 DIAGNOSIS — Z515 Encounter for palliative care: Secondary | ICD-10-CM

## 2018-11-08 NOTE — Progress Notes (Signed)
[facility] Consult Note Telephone: [phone]  Fax: [phone]  PATIENT NAME: Vickie Barton DOB: [birth year] MRN: [id number]  PRIMARY CARE PROVIDER:   [name], MD  REFERRING PROVIDER:  [name], MD [address] [facility] [place],  [place] [address]  RESPONSIBLE PARTY:   Self  Due to the COVID-19 crisis, this visit was done via telemedicine from my office and it was initiated and consent by this patient and or family.I did a phone visit in place of Telehealth as Telehealth was not available    RECOMMENDATIONS and PLAN: 1. ACP: DNR in place  2.Generalized weaknessR53.1 secondary tocongestive heart failure, endometrial cancerimproving; Encourage energy conservation and rest times.  3.Chronic pain G89.29secondary tocancerwill continue to monitor on pain scale, monitor efficacy vs adverse side effects; discussed to add tylenol as needed and maximize oxycodone every 4 hours   4.Anorexia R63.0 improving. Continue to encourage supplements and comfort feedings. Discuss nutrition  5. Palliative care encounter Z51.5; Palliative medicine team will continue to support patient, patient's family, and medical team. Visit consisted of counseling and education dealing with the complex and emotionally intense issues of symptom management and palliative care in the setting of serious and potentially life-threatening illness   I spent 40 minutes providing this consultation,  from 12:00pm to 12:40pm. More than 50% of the time in this consultation was spent coordinating communication.   HISTORY OF PRESENT ILLNESS:  Vickie Barton is a 85 y.o. year old female with multiple medical problems including Congestive heart failure, DVT 03/2017, endometrial cancer 01/2016 with hysterectomy, chemo, radiation also internal bradytherapy on 02/2018, hypothyroidism, hypertension, hyperlipidemia, arthritis, tonsillectomy, TEE  without cardioversion, laparoscopic bilateral salpingooophorectomy. Cystoscopy with ureteral stent placement. I called Vickie Barton for scheduled follow-up palliative care visit. Vickie Barton are in agreement. We talked about how she was feeling and she verbalize that she's doing okay. We talked about her appetite which has been improving. We talked about her upcoming appointment with that they're sending in the oncologist on August 24th. We talked about increase in pain that she has been having for which she continues to take 0xycodone for what she is taking every 6 hours for pain. She was inquiring to know if she could add Tylenol as that is how it was prescribed during hospitalization. We talked about adding Tylenol and option of increasing the Oxycodone to every 4 hours as prescribed. We talked about pain management and if she is finding that she is maximizing out Oxycodone daily then we will need to revisit and add long-acting in addition to Oxycodone for Effective pain management. She talked about the pain being in her groin area. We talked about the bruising that she has been experiencing. She noticed several more small bruises today on her arms when she showered. She does continue to walk in your house without a walker but when she is out in the community she does use her walker. No recent Falls. We talked about symptoms of infection what she has not experienced. She continues on Letrozole. She has had a good appetite no nausea or vomiting. We talked about her pending appointment with Podiatry which was actually supposed to be Dermatology. Vickie. Barton is waiting for a new referral. The place on her foot is healing. She has not gotten to her niece's home in [place] as of yet. Her niece had a death in her family and trying to get her kids to college. Vickie. Barton endorses it worked  out better because she wanted to wait and see if more treatment options were going to be offered since she is feeling better. We  talked about social distancing and protecting herself during covid-19, not going out unless absolutely necessary. She is looking forward to her appointment with Dr. Grayland Ormond oncologist. We talked about disease progression. She talked about being sick but the last time they attempted treatment. We talked about coping strategies and self-care. We talked about role of palliative care and plan of care. Discuss that will schedule follow-up visit in 4 weeks if needed or sooner should she declined. Discuss that if she's finding that she is maximizing her oxycodone daily to notify Dr Grayland Ormond and palliative care to revisit pain management. Vickie Barton in agreement. Therapeutic listening and emotional support provided. Contact information. Questions answered to satisfaction. Palliative Care was asked to help to continue to address goals of care.   CODE STATUS: DNR  PPS: 60% HOSPICE ELIGIBILITY/DIAGNOSIS: TBD  PAST MEDICAL HISTORY:  Past Medical History:  Diagnosis Date  . Arthritis   . CHF (congestive heart failure) ([place])   . Constipation   . DVT (deep venous thrombosis) ([place]) 03/2017   left leg  . Endometrial cancer ([place]) 01/28/2016   Hysterectomy, chemo + rad tx's, also internal brachytherapy on 02/2018.   [name] Hyperlipidemia   . Hypertension   . Hypothyroidism     SOCIAL HX:  Social History   Tobacco Use  . Smoking status: Former Research scientist (life sciences)  . Smokeless tobacco: Never Used  Substance Use Topics  . Alcohol use: No    ALLERGIES:  Allergies  Allergen Reactions  . Sulfa Antibiotics Rash  . Zinc Rash     PERTINENT MEDICATIONS:  Outpatient Encounter Medications as of 11/08/2018  Medication Sig  . alendronate (FOSAMAX) 70 MG tablet TAKE 1 TABLET BY MOUTH ONCE A WEEK. TAKE WITH A FULL GLASS OF WATER ON AN EMPTY STOMACH.  [name] ALPRAZolam (XANAX) 0.25 MG tablet Take 1 tablet (0.25 mg total) by mouth 2 (two) times daily as needed for anxiety.  [name] aspirin EC 81 MG EC tablet Take 1 tablet (81 mg total) by  mouth daily.  [name] atorvastatin (LIPITOR) 10 MG tablet Take 1 tablet (10 mg total) by mouth daily at 6 PM.  . Calcium Carbonate-Vitamin D (CALCIUM-D) 600-400 MG-UNIT TABS Take 1 tablet by mouth 1 day or 1 dose for 1 dose.  . docusate sodium (COLACE) 100 MG capsule Take 1 capsule (100 mg total) by mouth 2 (two) times daily.  [name] ELIQUIS 5 MG TABS tablet TAKE 2 TABLETS BY MOUTH TWICE A DAY  . letrozole (FEMARA) 2.5 MG tablet Take 1 tablet (2.5 mg total) by mouth daily.  [name] levothyroxine (SYNTHROID, LEVOTHROID) 150 MCG tablet TAKE 150 MCG BY MOUTH DAILY BEFORE BREAKFAST ON EMPTY STOMACH WITH A GLASS OF WATER 30-60MINS BEFORE BREAKFAST  . metoprolol tartrate (LOPRESSOR) 25 MG tablet Take 0.5 tablets (12.5 mg total) by mouth 2 (two) times daily.  . ondansetron (ZOFRAN) 8 MG tablet Take 1 tablet (8 mg total) by mouth every 8 (eight) hours as needed for nausea or vomiting.  . Oxycodone HCl 10 MG TABS Take 1 tablet (10 mg total) by mouth every 4 (four) hours as needed (for moderate to severe pain).  [name] senna (SENOKOT) 8.6 MG TABS tablet Take 1 tablet (8.6 mg total) by mouth daily.   No facility-administered encounter medications on file as of 11/08/2018.     PHYSICAL EXAM:   Deferred Vickie [place] Z Vickie Mcnicholas, NP

## 2018-11-11 NOTE — Progress Notes (Signed)
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  INTERVAL HISTORY: Patient last evaluated as an inpatient in March 2020.  She returns to clinic today for routine evaluation.  She continues to have left hip pain and left leg swelling, but otherwise feels well.  She has a well-healing pressure sore on her left heel.  She does not complain of weakness or fatigue.  She has no neurologic complaints. She denies any recent fevers or illnesses.  She denies any chest pain, shortness of breath, cough, or hemoptysis.  She denies any nausea, vomiting, constipation, or diarrhea.  She has no urinary complaints.  Patient offers no further specific complaints.  REVIEW OF SYSTEMS:   Review of Systems  Constitutional: Negative.  Negative for fever, malaise/fatigue and weight loss.  Respiratory: Negative.  Negative for cough and shortness of breath.   Cardiovascular: Positive for leg swelling. Negative for chest pain.  Gastrointestinal: Negative.  Negative for abdominal pain, constipation, nausea and vomiting.  Genitourinary: Negative.  Negative for dysuria.  Musculoskeletal: Positive for joint pain.  Skin: Negative.  Negative for rash.  Neurological: Negative.  Negative for sensory change, focal weakness, weakness and headaches.  Psychiatric/Behavioral: Negative.  The patient is not nervous/anxious.     As per HPI. Otherwise, a complete review of systems is negative.   PAST MEDICAL HISTORY: Past Medical History:  Diagnosis Date  . Arthritis   . CHF (congestive heart failure) ([place])   . Constipation   . DVT (deep venous thrombosis) ([place]) 03/2017   left leg  . Endometrial cancer ([place]) 01/28/2016   Hysterectomy, chemo + rad tx's, also internal brachytherapy on 02/2018.   [name] Hyperlipidemia   . Hypertension   . Hypothyroidism     PAST SURGICAL HISTORY: Past Surgical History:  Procedure Laterality Date  . CYSTOSCOPY W/ RETROGRADES Left 01/01/2017   Procedure: CYSTOSCOPY WITH RETROGRADE PYELOGRAM;  Surgeon: Nickie Retort, MD;  Location: [facility];  Service: Urology;  Laterality: Left;  . CYSTOSCOPY W/ RETROGRADES Left 04/23/2017   Procedure: CYSTOSCOPY WITH RETROGRADE PYELOGRAM;  Surgeon: Abbie Sons, MD;  Location: [facility];  Service: Urology;  Laterality: Left;  . CYSTOSCOPY W/ URETERAL STENT PLACEMENT Left 04/23/2017   Procedure: CYSTOSCOPY WITH STENT REMOVAL;  Surgeon: Abbie Sons, MD;  Location: [facility];  Service: Urology;  Laterality: Left;  . CYSTOSCOPY WITH STENT PLACEMENT Left 01/01/2017   Procedure: CYSTOSCOPY WITH STENT PLACEMENT;  Surgeon: Nickie Retort, MD;  Location: [facility];  Service: Urology;  Laterality: Left;  . DILATION AND CURETTAGE OF UTERUS    . HYSTEROSCOPY W/D&C N/A 01/28/2016   Procedure: DILATATION AND CURETTAGE /HYSTEROSCOPY;  Surgeon: [name], MD;  Location: [facility];  Service: Gynecology;  Laterality: N/A;  . LAPAROSCOPIC BILATERAL SALPINGO OOPHERECTOMY Bilateral 02/26/2016   Procedure: LAPAROSCOPIC BILATERAL SALPINGO OOPHORECTOMY;  Surgeon: [name], MD;  Location: [facility];  Service: Gynecology;  Laterality: Bilateral;  . LAPAROSCOPIC HYSTERECTOMY N/A 02/26/2016   Procedure: HYSTERECTOMY TOTAL LAPAROSCOPIC;  Surgeon: [name], MD;  Location: [facility];  Service: Gynecology;  Laterality: N/A;  . SENTINEL NODE BIOPSY N/A 02/26/2016   Procedure: SENTINEL NODE BIOPSY;  Surgeon: [name],  MD;  Location: [facility];  Service: Gynecology;  Laterality: N/A;  . TEE WITHOUT CARDIOVERSION N/A 06/01/2018   Procedure: TRANSESOPHAGEAL ECHOCARDIOGRAM (TEE);  Surgeon: [name], MD;  Location: [facility];  Service: Cardiovascular;  Laterality: N/A;  . TONSILLECTOMY      FAMILY HISTORY: Family History  Problem Relation Age of Onset  . Diabetes Father   . Heart disease Father   . Hypertension Father   . Stroke Father   . Diabetes Paternal Grandmother   . Heart disease Mother   . Hypertension Mother   . Stroke Mother   . Hypertension Sister   . Thyroid disease Sister   . Hypertension Brother     ADVANCED DIRECTIVES (Y/N):  N  HEALTH MAINTENANCE: Social History   Tobacco Use  . Smoking status: Former Research scientist (life sciences)  . Smokeless tobacco: Never Used  Substance Use Topics  . Alcohol use: No  . Drug use: No     Colonoscopy:  PAP:  Bone density:  Lipid panel:  Allergies  Allergen Reactions  . Sulfa Antibiotics Rash  . Zinc Rash    Current Outpatient Medications  Medication Sig Dispense Refill  . alendronate (FOSAMAX) 70 MG tablet TAKE 1 TABLET BY MOUTH ONCE A WEEK. TAKE WITH A FULL GLASS OF WATER ON AN EMPTY STOMACH. 12 tablet 1  . ALPRAZolam (XANAX) 0.25 MG tablet Take 1 tablet (0.25 mg total) by mouth 2 (two) times daily as needed for anxiety. 20 tablet 0  . aspirin EC 81 MG EC tablet Take 1 tablet (81 mg total) by mouth daily. 30 tablet 0  . atorvastatin (LIPITOR) 10 MG tablet Take 1 tablet (10 mg total) by mouth daily at 6 PM. 30 tablet 0  . docusate sodium (COLACE) 100 MG capsule Take 1 capsule (100 mg total) by mouth 2 (two) times daily. 10 capsule 0  . ELIQUIS 5 MG TABS tablet TAKE 2 TABLETS BY MOUTH TWICE A DAY 120 tablet 5  . letrozole (FEMARA) 2.5 MG tablet Take 1 tablet (2.5 mg total) by mouth daily. 30 tablet 3  . levothyroxine (SYNTHROID, LEVOTHROID) 150 MCG tablet TAKE 150 MCG BY MOUTH DAILY BEFORE BREAKFAST ON EMPTY STOMACH WITH A GLASS OF WATER 30-60MINS  BEFORE BREAKFAST  2  . metoprolol tartrate (LOPRESSOR) 25 MG tablet Take 0.5 tablets (12.5 mg total) by mouth 2 (two) times daily. 60 tablet 0  . Oxycodone HCl 10 MG TABS Take 1 tablet (10 mg total) by mouth every 4 (four) hours as needed (for moderate to severe pain). 90 tablet 0  . senna (SENOKOT) 8.6 MG TABS tablet Take 1 tablet (8.6 mg total) by mouth daily. 120 each 0  . Calcium Carbonate-Vitamin D (CALCIUM-D) 600-400 MG-UNIT TABS Take 1 tablet by mouth 1 day or 1 dose for 1 dose. 30 tablet 1  . ondansetron (ZOFRAN) 8 MG tablet Take 1 tablet (8 mg total) by mouth every 8 (eight) hours as needed for nausea or vomiting. (Patient not taking: Reported on 11/15/2018) 20 tablet 0  . oxyCODONE (OXYCONTIN) 10 mg 12 hr tablet Take 1 tablet (10 mg total) by mouth every 12 (twelve) hours. 30 tablet 0   No current facility-administered medications for this visit.      OBJECTIVE: Vitals:   11/15/18 1021  BP: 124/69  Pulse: 63  Resp: 18  Temp: 99.3 F (37.4 C)  Body mass index is 22.66 kg/m.    ECOG FS:1 - Symptomatic but completely ambulatory  General: Well-developed, well-nourished, no acute distress. Eyes: Pink conjunctiva, anicteric sclera. HEENT: Normocephalic, moist mucous membranes. Lungs: Clear to auscultation bilaterally. Heart: Regular rate and rhythm. No rubs, murmurs, or gallops. Abdomen: Soft, nontender, nondistended. No organomegaly noted, normoactive bowel sounds. Musculoskeletal: No edema, cyanosis, or clubbing. Neuro: Alert, answering all questions appropriately. Cranial nerves grossly intact. Skin: No rashes or petechiae noted.  Well-healing eschar approximately 1 cm on left heel. Psych: Normal affect.  LAB RESULTS:  Lab Results  Component Value Date   NA 134 (L) 06/05/2018   K 4.4 06/05/2018   CL 103 06/05/2018   CO2 25 06/05/2018   GLUCOSE 95 06/05/2018   BUN 9 06/05/2018   CREATININE 0.65 06/05/2018   CALCIUM 7.6 (L) 06/05/2018   PROT 5.3 (L) 06/01/2018    ALBUMIN 1.8 (L) 06/01/2018   AST 31 06/01/2018   ALT 18 06/01/2018   ALKPHOS 58 06/01/2018   BILITOT 0.5 06/01/2018   GFRNONAA >60 06/05/2018   GFRAA >60 06/05/2018    Lab Results  Component Value Date   WBC 11.2 (H) 06/07/2018   NEUTROABS 9.6 (H) 05/26/2018   HGB 7.8 (L) 06/07/2018   HCT 25.3 (L) 06/07/2018   MCV 87.5 06/07/2018   PLT 527 (H) 06/07/2018     STUDIES: No results found.  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  PLAN:    1. 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 Adjuvant treatment was recommended at that time, but patient refused on multiple occations and missed multiple follow-up appointments in the interim.  After agreeing to adjuvant chemotherapy, patient finally completed 6 cycles of carboplatinum and Taxol on April 21, 2017.  PET scan results from May 26, 2017 reviewed independently with interval decrease in size of left adnexal lesion.  There is residual hypermetabolic activity possibly indicating residual disease therefore patient was given a referral to radiation oncology for local control.  She completed a second round of XRT to this area completing on March 08, 2018.  She continues to take maintenance letrozole.  Her most recent imaging while in inpatient was on May 30, 2018 which revealed an approximately 7 cm left pelvic sidewall mass which was slightly enlarged from prior exam.  Patient then declined any further evaluation in clinic secondary to the COVID-19 pandemic.  Will get CT scan in mid September or approximately 6 months after her most recent imaging, and then patient will follow-up 1 to 2 days later with video assisted telemedicine visit. 2. Pain: Well controlled.  Appreciate palliative care input.  Patient was given prescription for OxyContin today and instructed to continue oxycodone as needed. 3. Hypokalemia:  Resolved.  Continue oral potassium supplementation as prescribed. 4.  Lymphedema: Chronic and relatively unchanged.  5.  DVT: Diagnosed on October 12, 2017.  Continue Eliquis as prescribed.  Given patient's altered anatomy from her malignancy as well as scarring from XRT, will continue on extended Eliquis.   6.  Anemia: Patient's hemoglobin is 7.8, but this is unchanged since March 2020. 7.  Osteoporosis: Patient had a DEXA scan on May 12, 2018 which revealed a T score of -3.9.  She has declined treatment with Fosamax, but agreed to continue with calcium and vitamin D.   Patient expressed understanding and was in agreement with this plan. She also understands that She can call clinic at any  time with any questions, concerns, or complaints.   Cancer Staging No matching staging information was found for the patient.   [name], MD   11/16/2018 6:04 AM

## 2018-11-14 ENCOUNTER — Other Ambulatory Visit: Payer: Self-pay

## 2018-11-15 ENCOUNTER — Encounter: Payer: Self-pay | Admitting: Oncology

## 2018-11-15 ENCOUNTER — Inpatient Hospital Stay: Payer: Medicare Other | Attending: Oncology | Admitting: Oncology

## 2018-11-15 ENCOUNTER — Inpatient Hospital Stay (HOSPITAL_BASED_OUTPATIENT_CLINIC_OR_DEPARTMENT_OTHER): Payer: Medicare Other | Admitting: Hospice and Palliative Medicine

## 2018-11-15 ENCOUNTER — Other Ambulatory Visit: Payer: Self-pay

## 2018-11-15 VITALS — BP 124/69 | HR 63 | Temp 99.3°F | Resp 18 | Wt 123.9 lb

## 2018-11-15 DIAGNOSIS — I890 Lymphedema, not elsewhere classified: Secondary | ICD-10-CM | POA: Diagnosis not present

## 2018-11-15 DIAGNOSIS — M25552 Pain in left hip: Secondary | ICD-10-CM | POA: Diagnosis not present

## 2018-11-15 DIAGNOSIS — Z515 Encounter for palliative care: Secondary | ICD-10-CM | POA: Insufficient documentation

## 2018-11-15 DIAGNOSIS — M810 Age-related osteoporosis without current pathological fracture: Secondary | ICD-10-CM | POA: Diagnosis not present

## 2018-11-15 DIAGNOSIS — Z86718 Personal history of other venous thrombosis and embolism: Secondary | ICD-10-CM | POA: Insufficient documentation

## 2018-11-15 DIAGNOSIS — L89629 Pressure ulcer of left heel, unspecified stage: Secondary | ICD-10-CM | POA: Insufficient documentation

## 2018-11-15 DIAGNOSIS — C541 Malignant neoplasm of endometrium: Secondary | ICD-10-CM

## 2018-11-15 DIAGNOSIS — G893 Neoplasm related pain (acute) (chronic): Secondary | ICD-10-CM | POA: Diagnosis not present

## 2018-11-15 DIAGNOSIS — D649 Anemia, unspecified: Secondary | ICD-10-CM | POA: Insufficient documentation

## 2018-11-15 DIAGNOSIS — Z7901 Long term (current) use of anticoagulants: Secondary | ICD-10-CM | POA: Insufficient documentation

## 2018-11-15 DIAGNOSIS — Z87891 Personal history of nicotine dependence: Secondary | ICD-10-CM | POA: Insufficient documentation

## 2018-11-15 MED ORDER — OXYCODONE HCL ER 10 MG PO T12A: 10.0000 mg | EXTENDED_RELEASE_TABLET | Freq: Two times a day (BID) | ORAL | 0 refills | Status: DC

## 2018-11-15 NOTE — Progress Notes (Signed)
Pt in for follow up denies any concerns or difficulties today.

## 2018-11-15 NOTE — Progress Notes (Signed)
[place]  [phone] [phone]   Name: Vickie Barton Date: 11/15/2018 MRN: [id number]  DOB: December 20, 1930  Patient Care Team: [name], MD as PCP - General (Family Medicine) Rockey Situ, Kathlene November, MD as PCP - Cardiology (Cardiology) [name], NP as Nurse Practitioner (Hospice and Palliative Medicine) , [name], NP as Nurse Practitioner (Hospice and Palliative Medicine) [name], MD as Consulting Physician (Oncology)    REASON FOR CONSULTATION: Palliative Care consult requested for this 85 y.o. 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  PMH also notable for history of PE/DVT on anticoagulation with Eliquis and status post IVC filter, left hydronephrosis and hydroureter status post ureteral stent placement.  Patient has had chronic pain from progressive metastatic disease.  CT on 05/20/2018 reveals large left pelvic sidewall mass, which had increased in size from previous study and likely is exacerbating pain. She is referred to palliative care to help with symptom management and to address goals.  SOCIAL HISTORY:     reports that she has quit smoking. She has never used smokeless tobacco. She reports that she does not drink alcohol or use drugs.   Patient is widowed.  She lives with her daughter.  Patient had a son who died in 2018/05/05 from cancer.  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  She later worked at an [employer].  ADVANCE DIRECTIVES:  Does not have  CODE STATUS: DNR  PAST MEDICAL HISTORY: Past Medical History:  Diagnosis Date  . Arthritis   . CHF (congestive heart failure) ([place])   . Constipation   . DVT (deep venous thrombosis)  ([place]) 05-05-17   left leg  . Endometrial cancer ([place]) 01/28/2016   Hysterectomy, chemo + rad tx's, also internal brachytherapy on 02/2018.   [name] Hyperlipidemia   . Hypertension   . Hypothyroidism     PAST SURGICAL HISTORY:  Past Surgical History:  Procedure Laterality Date  . CYSTOSCOPY W/ RETROGRADES Left 01/01/2017   Procedure: CYSTOSCOPY WITH RETROGRADE PYELOGRAM;  Surgeon: Nickie Retort, MD;  Location: [facility];  Service: Urology;  Laterality: Left;  . CYSTOSCOPY W/ RETROGRADES Left 04/23/2017   Procedure: CYSTOSCOPY WITH RETROGRADE PYELOGRAM;  Surgeon: Abbie Sons, MD;  Location: [facility];  Service: Urology;  Laterality: Left;  . CYSTOSCOPY W/ URETERAL STENT PLACEMENT Left 04/23/2017   Procedure: CYSTOSCOPY WITH STENT REMOVAL;  Surgeon: Abbie Sons, MD;  Location: [facility];  Service: Urology;  Laterality: Left;  . CYSTOSCOPY WITH STENT PLACEMENT Left 01/01/2017   Procedure: CYSTOSCOPY WITH STENT PLACEMENT;  Surgeon: Nickie Retort, MD;  Location: [facility];  Service: Urology;  Laterality: Left;  . DILATION AND CURETTAGE OF UTERUS    . HYSTEROSCOPY W/D&C N/A 01/28/2016   Procedure: DILATATION AND CURETTAGE /HYSTEROSCOPY;  Surgeon: [name], MD;  Location: [facility];  Service: Gynecology;  Laterality: N/A;  . LAPAROSCOPIC BILATERAL SALPINGO OOPHERECTOMY Bilateral 02/26/2016   Procedure: LAPAROSCOPIC BILATERAL SALPINGO OOPHORECTOMY;  Surgeon: [name], MD;  Location: [facility];  Service: Gynecology;  Laterality: Bilateral;  . LAPAROSCOPIC HYSTERECTOMY N/A 02/26/2016   Procedure: HYSTERECTOMY TOTAL LAPAROSCOPIC;  Surgeon: [name], MD;  Location: [facility];  Service: Gynecology;  Laterality: N/A;  . SENTINEL NODE BIOPSY N/A 02/26/2016   Procedure: SENTINEL NODE BIOPSY;  Surgeon:  [name], MD;  Location: [facility];  Service: Gynecology;  Laterality: N/A;  . TEE WITHOUT CARDIOVERSION N/A 06/01/2018   Procedure: TRANSESOPHAGEAL ECHOCARDIOGRAM (TEE);  Surgeon:  [name], MD;  Location: [facility];  Service: Cardiovascular;  Laterality: N/A;  . TONSILLECTOMY      HEMATOLOGY/ONCOLOGY HISTORY:  Oncology History   No history exists.    ALLERGIES:  is allergic to sulfa antibiotics and zinc.  MEDICATIONS:  Current Outpatient Medications  Medication Sig Dispense Refill  . alendronate (FOSAMAX) 70 MG tablet TAKE 1 TABLET BY MOUTH ONCE A WEEK. TAKE WITH A FULL GLASS OF WATER ON AN EMPTY STOMACH. 12 tablet 1  . ALPRAZolam (XANAX) 0.25 MG tablet Take 1 tablet (0.25 mg total) by mouth 2 (two) times daily as needed for anxiety. 20 tablet 0  . aspirin EC 81 MG EC tablet Take 1 tablet (81 mg total) by mouth daily. 30 tablet 0  . atorvastatin (LIPITOR) 10 MG tablet Take 1 tablet (10 mg total) by mouth daily at 6 PM. 30 tablet 0  . Calcium Carbonate-Vitamin D (CALCIUM-D) 600-400 MG-UNIT TABS Take 1 tablet by mouth 1 day or 1 dose for 1 dose. 30 tablet 1  . docusate sodium (COLACE) 100 MG capsule Take 1 capsule (100 mg total) by mouth 2 (two) times daily. 10 capsule 0  . ELIQUIS 5 MG TABS tablet TAKE 2 TABLETS BY MOUTH TWICE A DAY 120 tablet 5  . letrozole (FEMARA) 2.5 MG tablet Take 1 tablet (2.5 mg total) by mouth daily. 30 tablet 3  . levothyroxine (SYNTHROID, LEVOTHROID) 150 MCG tablet TAKE 150 MCG BY MOUTH DAILY BEFORE BREAKFAST ON EMPTY STOMACH WITH A GLASS OF WATER 30-60MINS BEFORE BREAKFAST  2  . metoprolol tartrate (LOPRESSOR) 25 MG tablet Take 0.5 tablets (12.5 mg total) by mouth 2 (two) times daily. 60 tablet 0  . ondansetron (ZOFRAN) 8 MG tablet Take 1 tablet (8 mg total) by mouth every 8 (eight) hours as needed for nausea or vomiting. (Patient not taking: Reported on 11/15/2018) 20 tablet 0  . Oxycodone HCl 10 MG TABS Take 1 tablet (10 mg total) by mouth every 4 (four) hours as needed (for moderate to severe pain). 90 tablet 0  . senna (SENOKOT) 8.6 MG TABS tablet Take 1 tablet (8.6 mg total) by mouth daily. 120 each 0   No current  facility-administered medications for this visit.     VITAL SIGNS: There were no vitals taken for this visit. There were no vitals filed for this visit.  Estimated body mass index is 22.66 kg/m as calculated from the following:   Height as of 06/01/18: 5\' 2"  (1.575 m).   Weight as of an earlier encounter on 11/15/18: 123 lb 14.4 oz (56.2 kg).  LABS: CBC:    Component Value Date/Time   WBC 11.2 (H) 06/07/2018 0515   HGB 7.8 (L) 06/07/2018 0515   HCT 25.3 (L) 06/07/2018 0515   PLT 527 (H) 06/07/2018 0515   MCV 87.5 06/07/2018 0515   NEUTROABS 9.6 (H) 05/26/2018 1544   LYMPHSABS 0.8 05/26/2018 1544   MONOABS 0.7 05/26/2018 1544   EOSABS 0.0 05/26/2018 1544   BASOSABS 0.0 05/26/2018 1544   Comprehensive Metabolic Panel:    Component Value Date/Time   NA 134 (L) 06/05/2018 0533   K 4.4 06/05/2018 0533   CL 103 06/05/2018 0533   CO2 25 06/05/2018 0533   BUN 9 06/05/2018 0533   CREATININE 0.65 06/05/2018 0533   GLUCOSE 95 06/05/2018  0533   CALCIUM 7.6 (L) 06/05/2018 0533   AST 31 06/01/2018 0259   ALT 18 06/01/2018 0259   ALKPHOS 58 06/01/2018 0259   BILITOT 0.5 06/01/2018 0259   PROT 5.3 (L) 06/01/2018 0259   ALBUMIN 1.8 (L) 06/01/2018 0259    RADIOGRAPHIC STUDIES: No results found.  PERFORMANCE STATUS (ECOG) : 2  REVIEW OF SYSTEMS: As noted above. Otherwise, a complete review of systems is negative.  PHYSICAL EXAM: General: NAD, frail appearing, thin Pulmonary: Unlabored Extremities: Bilateral lower extremity edema Skin: no rashes Neurological: Weakness but otherwise nonfocal  IMPRESSION: Routine follow-up visit today in the clinic.  Patient reports having done well since discharging from rehab and April 2020.  She is back home with family.  Patient says that she is independent with all of her own care.  She does not use a walker to ambulate in the house.  She has had no recent falls.  Patient reports that her weight is up by couple pounds and that she is eating  well.  Patient continues to have chronic pain.  Overall, she says her pain has been better than it was months ago.  She says pain averages 5 out of 10.  She is taking oxycodone 10 mg tablets and says that helps but is short-lived.  She was previously managed on OxyContin but it does not appear that that continued when she left rehab.  Patient would be interested in restarting an LAO.  Patient says that she often needs 2-4 oxycodone tablets per day.  Is hopeful that initiation of an LAO will lessen her need for as needed pain medications.  Case and plan discussed with Dr. Grayland Ormond.  Suspect that patient would benefit from reimaging as it is been some months since we last evaluated the cancer.  PLAN: -Continue current scope of treatment -Continue oxycodone 10 mg every 4 hours as needed for pain -Start OxyContin 10 mg every 12 hours -Patient is followed by ambulatory palliative care in the home -Televisit in 2 weeks   Patient expressed understanding and was in agreement with this plan. She also understands that She can call clinic at any time with any questions, concerns, or complaints.     Time Total: 20 minutes  Visit consisted of counseling and education dealing with the complex and emotionally intense issues of symptom management and palliative care in the setting of serious and potentially life-threatening illness.Greater than 50%  of this time was spent counseling and coordinating care related to the above assessment and plan.  Signed by: Altha Harm, PhD, NP-C [phone] (Work Cell)

## 2018-11-24 ENCOUNTER — Other Ambulatory Visit: Payer: Medicare Other | Admitting: Nurse Practitioner

## 2018-11-24 ENCOUNTER — Encounter: Payer: Self-pay | Admitting: Nurse Practitioner

## 2018-11-24 ENCOUNTER — Other Ambulatory Visit: Payer: Self-pay

## 2018-11-24 DIAGNOSIS — Z515 Encounter for palliative care: Secondary | ICD-10-CM | POA: Diagnosis not present

## 2018-11-24 NOTE — Progress Notes (Signed)
[facility] Consult Note Telephone: [phone]  Fax: [phone]  PATIENT NAME: Vickie Barton DOB: 1931-02-08 MRN: [id number]  PRIMARY CARE PROVIDER:   [name], MD  REFERRING PROVIDER:  [name], MD [address] [facility] [place],  [place] [address]  RESPONSIBLE PARTY:   Self  Due to the COVID-19 crisis, this visit was done via telemedicine from my office and it was initiated and consent by this patient and or family.I did a phone visit in place of Telehealth as Telehealth was not available    RECOMMENDATIONS and PLAN: 1. ACP: DNR in place  2.Generalized weaknessR53.1 secondary tocongestive heart failure, endometrial cancerimproving;Encourage energy conservation and rest times.  3.Chronic pain G89.29secondary tocancerwill continue to monitor on pain scale, monitor efficacy vs adverse side effects; discussed to add tylenol as needed and maximize oxycodone every 4 hours   4.Anorexia R63.0improving.Continue to encourage supplements and comfort feedings. Discuss nutrition  5. Palliative care encounter Z51.5; Palliative medicine team will continue to support patient, patient's family, and medical team. Visit consisted of counseling and education dealing with the complex and emotionally intense issues of symptom management and palliative care in the setting of serious and potentially life-threatening illness  I spent 30 minutes providing this consultation,  from 1:00pm to 1:30pm. More than 50% of the time in this consultation was spent coordinating communication.   HISTORY OF PRESENT ILLNESS:  Vickie Barton is a 85 y.o. year old female with multiple medical problems including Congestive heart failure, DVT 03/2017, endometrial cancer 01/2016 with hysterectomy, chemo, radiation also internal bradytherapy on 02/2018, hypothyroidism, hypertension, hyperlipidemia, arthritis, tonsillectomy, TEE  without cardioversion, laparoscopic bilateral salpingooophorectomy. Cystoscopy with ureteral stent placement. Saw Dr. Grayland Ormond 8 / 25 / 2020 for routine evaluation. Continued to have left hip pain and left leg swelling with well healing pressure sore on left heel. 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 Adjunct treatment recommended but she refused on multiple occasions and missed appointment. She did eventually agreed to adjunct chemotherapy and finally completed 6 cycles of carboplatin and taxol on 1/30 / 2019. On 3 / 6 / 2018 pet scan reviewed with decrease in size of left adnexal lesion. She was given a referral to radiation oncology for local control and completed a second round of radiation therapy 12 / 17 / 2019. She does continue to take maintenance letrozole. Recent Imaging while inpatient 3 / 9 / 2020 revealed 7 cm left pelvic sidewall mass which was slightly enlarged from prior exam. Denied any further evaluation. Plan to get follow-up CT mid September 2020. Pain she was given a prescription for OxyContin today and Oxycodone with control. Anemia with hemoglobin 7.8 which is unchanged since 3 / 2020. She does continue to be followed by palliative care through the cancer center in addition to palliative care at home. I called Ms. Musto for telemedicine scheduled palliative care follow-up visit. We talked about purpose of visit and see was an agreement. We talked about how Ms. Melichar was feeling today and she verbalize that she is doing well. We talked about symptoms of pain when she does intermittently experience. We talked about her new team regiment with OxyContin. Ms. Symington took OxyContin one time and it did not seem to help so she no longer has been taking it and only oxycodone. Education done on short and long acting pain medication, regiment modalities. We talked about purpose for long-term versus  short-term. We talked about  how the medications work together. We talked about the MsContin being scheduled, long-acting and will remain in her system. Ms. Lajara was agreeable to try for a week to see if it does improve her pain. We talked about the use of Oxycodone during this time for breakthrough pain with a goal of decreasing the amount of Oxycodone used. Ms. Bourbeau verbalizes understanding. Ms. Tener talked about her upcoming appointment in 2 weeks with palliative care at the [facility] and will re-evaluate at that time. We talked about her appetite which is good. Ms. Canipe is looking forward to repeating her scans to see where she stands as far as her cancer and treatment options. Appetite remains good. Ms. Deterding is continues to be functional, independent. Medical goals remain with wishes to continue aggressive intervention has able. We talked about role of palliative care and plan of care. Therapeutic listening and emotional support provided. Contact information provided. Scheduled home community follow up palliative care visit in 4 weeks or sooner said she declined. Ms. Frangipane  in agreement. Palliative care appointment scheduled. Palliative Care was asked to help to continue to address goals of care.   CODE STATUS: DNR PPS: 60% HOSPICE ELIGIBILITY/DIAGNOSIS: TBD  PAST MEDICAL HISTORY:  Past Medical History:  Diagnosis Date   Arthritis    CHF (congestive heart failure) ([place])    Constipation    DVT (deep venous thrombosis) ([place]) 03/2017   left leg   Endometrial cancer ([place]) 01/28/2016   Hysterectomy, chemo + rad tx's, also internal brachytherapy on 02/2018.    Hyperlipidemia    Hypertension    Hypothyroidism     SOCIAL HX:  Social History   Tobacco Use   Smoking status: Former Smoker   Smokeless tobacco: Never Used  Substance Use Topics   Alcohol use: No    ALLERGIES:  Allergies  Allergen Reactions   Sulfa Antibiotics Rash   Zinc Rash     PERTINENT MEDICATIONS:    Outpatient Encounter Medications as of 11/24/2018  Medication Sig   alendronate (FOSAMAX) 70 MG tablet TAKE 1 TABLET BY MOUTH ONCE A WEEK. TAKE WITH A FULL GLASS OF WATER ON AN EMPTY STOMACH.   ALPRAZolam (XANAX) 0.25 MG tablet Take 1 tablet (0.25 mg total) by mouth 2 (two) times daily as needed for anxiety.   aspirin EC 81 MG EC tablet Take 1 tablet (81 mg total) by mouth daily.   atorvastatin (LIPITOR) 10 MG tablet Take 1 tablet (10 mg total) by mouth daily at 6 PM.   Calcium Carbonate-Vitamin D (CALCIUM-D) 600-400 MG-UNIT TABS Take 1 tablet by mouth 1 day or 1 dose for 1 dose.   docusate sodium (COLACE) 100 MG capsule Take 1 capsule (100 mg total) by mouth 2 (two) times daily.   ELIQUIS 5 MG TABS tablet TAKE 2 TABLETS BY MOUTH TWICE A DAY   letrozole (FEMARA) 2.5 MG tablet Take 1 tablet (2.5 mg total) by mouth daily.   levothyroxine (SYNTHROID, LEVOTHROID) 150 MCG tablet TAKE 150 MCG BY MOUTH DAILY BEFORE BREAKFAST ON EMPTY STOMACH WITH A GLASS OF WATER 30-60MINS BEFORE BREAKFAST   metoprolol tartrate (LOPRESSOR) 25 MG tablet Take 0.5 tablets (12.5 mg total) by mouth 2 (two) times daily.   ondansetron (ZOFRAN) 8 MG tablet Take 1 tablet (8 mg total) by mouth every 8 (eight) hours as needed for nausea or vomiting. (Patient not taking: Reported on 11/15/2018)   oxyCODONE (OXYCONTIN) 10 mg 12 hr tablet Take 1 tablet (10 mg  total) by mouth every 12 (twelve) hours.   Oxycodone HCl 10 MG TABS Take 1 tablet (10 mg total) by mouth every 4 (four) hours as needed (for moderate to severe pain).   senna (SENOKOT) 8.6 MG TABS tablet Take 1 tablet (8.6 mg total) by mouth daily.   No facility-administered encounter medications on file as of 11/24/2018.     PHYSICAL EXAM:  deferred  Nneka Blanda Ihor Gully, NP

## 2018-11-25 NOTE — Progress Notes (Signed)
[facility]  [phone] [phone]  ID: Vickie Elk [place] OB: 05/07/1930  MR#: [id number]  [id number]  Patient Care Team: [name], MD as PCP - General (Family Medicine) [name], MD as PCP - Cardiology (Cardiology) [name], NP as Nurse Practitioner (Hospice and Palliative Medicine) Borders, [name], NP as Nurse Practitioner (Hospice and Palliative Medicine) [name], MD as Consulting Physician (Oncology)  I connected with Vickie Barton on 12/08/18 at  2:30 PM EDT by telephone visit and verified that I am speaking with the correct person using two identifiers.   I discussed the limitations, risks, security and privacy concerns of performing an evaluation and management service by telemedicine and the availability of in-person appointments. I also discussed with the patient that there may be a patient responsible charge related to this service. The patient expressed understanding and agreed to proceed.   Other persons participating in the visit and their role in the encounter: Patient, MD  Patient's location: Home Provider's location: Clinic  CHIEF COMPLAINT: Stage IIIa high-grade serous endometrial carcinoma.  INTERVAL HISTORY: Patient agreed to evaluation and discussion of her imaging results via telephone today.  She continues to have left hip and leg pain, but states it is well controlled with her current narcotic regimen.  She continues to tolerate letrozole without significant side effects. She does not complain of weakness or fatigue.  She has no neurologic complaints. She denies any recent fevers or illnesses.  She denies any chest pain, shortness of breath, cough, or hemoptysis.  She denies any nausea, vomiting, constipation, or diarrhea.  She has no urinary complaints.  Patient offers no further specific complaints today.  REVIEW OF SYSTEMS:   Review of Systems  Constitutional: Negative.   Negative for fever, malaise/fatigue and weight loss.  Respiratory: Negative.  Negative for cough and shortness of breath.   Cardiovascular: Positive for leg swelling. Negative for chest pain.  Gastrointestinal: Negative.  Negative for abdominal pain, constipation, nausea and vomiting.  Genitourinary: Negative.  Negative for dysuria.  Musculoskeletal: Positive for joint pain.  Skin: Negative.  Negative for rash.  Neurological: Negative.  Negative for sensory change, focal weakness, weakness and headaches.  Psychiatric/Behavioral: Negative.  The patient is not nervous/anxious.     As per HPI. Otherwise, a complete review of systems is negative.  PAST MEDICAL HISTORY: Past Medical History:  Diagnosis Date  . Arthritis   . CHF (congestive heart failure) ([place])   . Constipation   . DVT (deep venous thrombosis) ([place]) 03/2017   left leg  . Endometrial cancer ([place]) 01/28/2016   Hysterectomy, chemo + rad tx's, also internal brachytherapy on 02/2018.   [name] Hyperlipidemia   . Hypertension   . Hypothyroidism     PAST SURGICAL HISTORY: Past Surgical History:  Procedure Laterality Date  . CYSTOSCOPY W/ RETROGRADES Left 01/01/2017   Procedure: CYSTOSCOPY WITH RETROGRADE PYELOGRAM;  Surgeon: Nickie Retort, MD;  Location: [facility] ORS;  Service: Urology;  Laterality: Left;  . CYSTOSCOPY W/ RETROGRADES Left 04/23/2017   Procedure: CYSTOSCOPY WITH RETROGRADE PYELOGRAM;  Surgeon: Abbie Sons, MD;  Location: [facility] ORS;  Service: Urology;  Laterality: Left;  . CYSTOSCOPY W/ URETERAL STENT PLACEMENT Left 04/23/2017   Procedure: CYSTOSCOPY WITH STENT REMOVAL;  Surgeon: Abbie Sons, MD;  Location: [facility] ORS;  Service: Urology;  Laterality: Left;  . CYSTOSCOPY WITH STENT PLACEMENT Left 01/01/2017   Procedure: CYSTOSCOPY WITH STENT PLACEMENT;  Surgeon: Nickie Retort, MD;  Location[facility]  ORS;  Service: Urology;  Laterality: Left;  . DILATION AND CURETTAGE OF UTERUS    . HYSTEROSCOPY W/D&C N/A  01/28/2016   Procedure: DILATATION AND CURETTAGE /HYSTEROSCOPY;  Surgeon: [name], MD;  Location: [facility] ORS;  Service: Gynecology;  Laterality: N/A;  . LAPAROSCOPIC BILATERAL SALPINGO OOPHERECTOMY Bilateral 02/26/2016   Procedure: LAPAROSCOPIC BILATERAL SALPINGO OOPHORECTOMY;  Surgeon: [name], MD;  Location: [facility] ORS;  Service: Gynecology;  Laterality: Bilateral;  . LAPAROSCOPIC HYSTERECTOMY N/A 02/26/2016   Procedure: HYSTERECTOMY TOTAL LAPAROSCOPIC;  Surgeon: [name], MD;  Location: [facility] ORS;  Service: Gynecology;  Laterality: N/A;  . SENTINEL NODE BIOPSY N/A 02/26/2016   Procedure: SENTINEL NODE BIOPSY;  Surgeon: [name], MD;  Location: [facility] ORS;  Service: Gynecology;  Laterality: N/A;  . TEE WITHOUT CARDIOVERSION N/A 06/01/2018   Procedure: TRANSESOPHAGEAL ECHOCARDIOGRAM (TEE);  Surgeon: [name], MD;  Location: [facility] ORS;  Service: Cardiovascular;  Laterality: N/A;  . TONSILLECTOMY      FAMILY HISTORY: Family History  Problem Relation Age of Onset  . Diabetes Father   . Heart disease Father   . Hypertension Father   . Stroke Father   . Diabetes Paternal Grandmother   . Heart disease Mother   . Hypertension Mother   . Stroke Mother   . Hypertension Sister   . Thyroid disease Sister   . Hypertension Brother     ADVANCED DIRECTIVES (Y/N):  N  HEALTH MAINTENANCE: Social History   Tobacco Use  . Smoking status: Former Research scientist (life sciences)  . Smokeless tobacco: Never Used  Substance Use Topics  . Alcohol use: No  . Drug use: No     Colonoscopy:  PAP:  Bone density:  Lipid panel:  Allergies  Allergen Reactions  . Sulfa Antibiotics Rash  . Zinc Rash    Current Outpatient Medications  Medication Sig Dispense Refill  . atorvastatin (LIPITOR) 10 MG tablet Take 1 tablet (10 mg total) by mouth daily at 6 PM. 30 tablet 0  . ELIQUIS 5 MG TABS tablet TAKE 2 TABLETS BY MOUTH TWICE A DAY 120 tablet 5  . furosemide (LASIX) 20 MG tablet Take 20 mg by mouth 2  (two) times daily.    [name] letrozole (FEMARA) 2.5 MG tablet TAKE 1 TABLET BY MOUTH EVERY DAY 90 tablet 1  . levothyroxine (SYNTHROID, LEVOTHROID) 150 MCG tablet TAKE 150 MCG BY MOUTH DAILY BEFORE BREAKFAST ON EMPTY STOMACH WITH A GLASS OF WATER 30-60MINS BEFORE BREAKFAST  2  . metoprolol tartrate (LOPRESSOR) 25 MG tablet Take 0.5 tablets (12.5 mg total) by mouth 2 (two) times daily. 60 tablet 0  . ondansetron (ZOFRAN) 8 MG tablet Take 1 tablet (8 mg total) by mouth every 8 (eight) hours as needed for nausea or vomiting. 20 tablet 0  . Oxycodone HCl 10 MG TABS Take 1 tablet (10 mg total) by mouth every 4 (four) hours as needed (for moderate to severe pain). 90 tablet 0   No current facility-administered medications for this visit.      OBJECTIVE: There were no vitals filed for this visit.   There is no height or weight on file to calculate BMI.    ECOG FS:1 - Symptomatic but completely ambulatory  LAB RESULTS:  Lab Results  Component Value Date   NA 134 (L) 06/05/2018   K 4.4 06/05/2018   CL 103 06/05/2018   CO2 25 06/05/2018   GLUCOSE 95 06/05/2018   BUN 9 06/05/2018   CREATININE 0.70 12/05/2018  CALCIUM 7.6 (L) 06/05/2018   PROT 5.3 (L) 06/01/2018   ALBUMIN 1.8 (L) 06/01/2018   AST 31 06/01/2018   ALT 18 06/01/2018   ALKPHOS 58 06/01/2018   BILITOT 0.5 06/01/2018   GFRNONAA >60 06/05/2018   GFRAA >60 06/05/2018    Lab Results  Component Value Date   WBC 11.2 (H) 06/07/2018   NEUTROABS 9.6 (H) 05/26/2018   HGB 7.8 (L) 06/07/2018   HCT 25.3 (L) 06/07/2018   MCV 87.5 06/07/2018   PLT 527 (H) 06/07/2018     STUDIES: Ct Chest W Contrast  Result Date: 12/05/2018 CLINICAL DATA:  Follow-up endometrial carcinoma EXAM: CT CHEST, ABDOMEN, AND PELVIS WITH CONTRAST TECHNIQUE: Multidetector CT imaging of the chest, abdomen and pelvis was performed following the standard protocol during bolus administration of intravenous contrast. CONTRAST:  61mL OMNIPAQUE IOHEXOL 300 MG/ML SOLN,  additional oral enteric contrast COMPARISON:  05/30/2018, 05/20/2018, PET-CT, 02/04/2018 FINDINGS: CT CHEST FINDINGS Cardiovascular: Aortic atherosclerosis. Cardiomegaly and three-vessel coronary artery calcifications and/or stents. No pericardial effusion. Mediastinum/Nodes: No enlarged mediastinal, hilar, or axillary lymph nodes. Thyroid gland, trachea, and esophagus demonstrate no significant findings. Lungs/Pleura: Dependent bibasilar scarring, with a nodular opacity of the tendon right lung base measuring 4 mm, unchanged from prior examination. No pleural effusion or pneumothorax. Musculoskeletal: No chest wall mass or suspicious bone lesions identified. CT ABDOMEN PELVIS FINDINGS Hepatobiliary: No solid liver abnormality is seen. No gallstones, gallbladder wall thickening, or biliary dilatation. Pancreas: Unremarkable. No pancreatic ductal dilatation or surrounding inflammatory changes. Spleen: Normal in size without significant abnormality. Adrenals/Urinary Tract: Adrenal glands are unremarkable. Kidneys are normal, without renal calculi, solid lesion, or hydronephrosis. Bladder is unremarkable. Stomach/Bowel: Stomach is within normal limits. Appendix appears normal. No evidence of bowel wall thickening, distention, or inflammatory changes. Vascular/Lymphatic: Aortic atherosclerosis. Infrarenal IVC filter. Unchanged prominent subcentimeter periaortic retroperitoneal lymph nodes and post treatment soft tissue about the inferior vena cava (series 2, image 76). There is a redemonstrated left iliac and left pelvic sidewall soft tissue mass with internal calcifications, measuring approximately 5.3 x 2.9 cm (series 2, image 90). This is very slightly decreased in size when compared to prior examination dated 05/20/2018. At the time of examination dated 05/30/2018 there appeared to be a retroperitoneal fluid collection adjacent to this lesion tracking into the left psoas and iliacus musculature, perhaps a hematoma  which has since resolved. Reproductive: Status post hysterectomy. Other: No abdominal wall hernia or abnormality. No abdominopelvic ascites. Musculoskeletal: No acute or significant osseous findings. IMPRESSION: 1. There is a redemonstrated left iliac and left pelvic sidewall soft tissue mass with internal calcifications, measuring approximately 5.3 x 2.9 cm (series 2, image 90). This is very slightly decreased in size when compared to prior examination dated 05/20/2018. 2. At the time of examination dated 05/30/2018 there appeared to be a retroperitoneal fluid collection adjacent to this lesion tracking into the left psoas and iliacus musculature, perhaps a hematoma which has since resolved. 3. Unchanged prominent subcentimeter periaortic retroperitoneal lymph nodes and post treatment soft tissue about the inferior vena cava (series 2, image 76). 4.  Status post hysterectomy. 5.  No evidence of metastatic disease in the chest. 6.  Aortic Atherosclerosis (ICD10-I70.0). Coronary artery disease. Electronically Signed   By: Eddie Candle M.D.   On: 12/05/2018 14:11   Ct Abdomen Pelvis W Contrast  Result Date: 12/05/2018 CLINICAL DATA:  Follow-up endometrial carcinoma EXAM: CT CHEST, ABDOMEN, AND PELVIS WITH CONTRAST TECHNIQUE: Multidetector CT imaging of the chest, abdomen and pelvis was performed  following the standard protocol during bolus administration of intravenous contrast. CONTRAST:  47mL OMNIPAQUE IOHEXOL 300 MG/ML SOLN, additional oral enteric contrast COMPARISON:  05/30/2018, 05/20/2018, PET-CT, 02/04/2018 FINDINGS: CT CHEST FINDINGS Cardiovascular: Aortic atherosclerosis. Cardiomegaly and three-vessel coronary artery calcifications and/or stents. No pericardial effusion. Mediastinum/Nodes: No enlarged mediastinal, hilar, or axillary lymph nodes. Thyroid gland, trachea, and esophagus demonstrate no significant findings. Lungs/Pleura: Dependent bibasilar scarring, with a nodular opacity of the tendon right  lung base measuring 4 mm, unchanged from prior examination. No pleural effusion or pneumothorax. Musculoskeletal: No chest wall mass or suspicious bone lesions identified. CT ABDOMEN PELVIS FINDINGS Hepatobiliary: No solid liver abnormality is seen. No gallstones, gallbladder wall thickening, or biliary dilatation. Pancreas: Unremarkable. No pancreatic ductal dilatation or surrounding inflammatory changes. Spleen: Normal in size without significant abnormality. Adrenals/Urinary Tract: Adrenal glands are unremarkable. Kidneys are normal, without renal calculi, solid lesion, or hydronephrosis. Bladder is unremarkable. Stomach/Bowel: Stomach is within normal limits. Appendix appears normal. No evidence of bowel wall thickening, distention, or inflammatory changes. Vascular/Lymphatic: Aortic atherosclerosis. Infrarenal IVC filter. Unchanged prominent subcentimeter periaortic retroperitoneal lymph nodes and post treatment soft tissue about the inferior vena cava (series 2, image 76). There is a redemonstrated left iliac and left pelvic sidewall soft tissue mass with internal calcifications, measuring approximately 5.3 x 2.9 cm (series 2, image 90). This is very slightly decreased in size when compared to prior examination dated 05/20/2018. At the time of examination dated 05/30/2018 there appeared to be a retroperitoneal fluid collection adjacent to this lesion tracking into the left psoas and iliacus musculature, perhaps a hematoma which has since resolved. Reproductive: Status post hysterectomy. Other: No abdominal wall hernia or abnormality. No abdominopelvic ascites. Musculoskeletal: No acute or significant osseous findings. IMPRESSION: 1. There is a redemonstrated left iliac and left pelvic sidewall soft tissue mass with internal calcifications, measuring approximately 5.3 x 2.9 cm (series 2, image 90). This is very slightly decreased in size when compared to prior examination dated 05/20/2018. 2. At the time of  examination dated 05/30/2018 there appeared to be a retroperitoneal fluid collection adjacent to this lesion tracking into the left psoas and iliacus musculature, perhaps a hematoma which has since resolved. 3. Unchanged prominent subcentimeter periaortic retroperitoneal lymph nodes and post treatment soft tissue about the inferior vena cava (series 2, image 76). 4.  Status post hysterectomy. 5.  No evidence of metastatic disease in the chest. 6.  Aortic Atherosclerosis (ICD10-I70.0). Coronary artery disease. Electronically Signed   By: Eddie Candle M.D.   On: 12/05/2018 14:11    ASSESSMENT: Stage IIIa high-grade serous endometrial carcinoma.  PLAN:    1. Stage IIIa high-grade serous endometrial carcinoma: Patient underwent surgery on February 26, 2016. Adjuvant treatment was recommended at that time, but patient refused on multiple occations and missed multiple follow-up appointments in the interim.  After agreeing to adjuvant chemotherapy, patient finally completed 6 cycles of carboplatinum and Taxol on April 21, 2017.  PET scan results from May 26, 2017 reviewed independently with interval decrease in size of left adnexal lesion.  There is residual hypermetabolic activity possibly indicating residual disease therefore patient was given a referral to radiation oncology for local control.  She completed a second round of XRT to this area completing on March 08, 2018.  She continues to take maintenance letrozole.  CT scan results from December 05, 2018 reviewed independently and reported as above with mild decrease in size of known pelvic sidewall mass.  Patient wishes to continue to minimize visits and  imaging, therefore return to clinic in 6 months with repeat CT scan and further evaluation.   2. Pain: Well controlled.  Appreciate palliative care input.  Continue oxycodone as needed. 3. Hypokalemia: Resolved.  Continue oral potassium supplementation as prescribed. 4.  Lymphedema: Chronic and  relatively unchanged.  5.  DVT: Diagnosed on October 12, 2017.  Continue Eliquis as prescribed.  Given patient's altered anatomy from her malignancy as well as scarring from XRT, will continue on extended Eliquis.   6.  Anemia: Patient's hemoglobin is 7.8, but this is unchanged since March 2020. 7.  Osteoporosis: Patient had a DEXA scan on May 12, 2018 which revealed a T score of -3.9.  Patient has declined any further bone mineral densities.  She also declined treatment with Fosamax, but agreed to continue with calcium and vitamin D.  I provided 25 minutes of non face-to-face telephone visit time during this encounter, and > 50% was spent counseling as documented under my assessment & plan.    Patient expressed understanding and was in agreement with this plan. She also understands that She can call clinic at any time with any questions, concerns, or complaints.    [name], MD   12/08/2018 4:56 PM

## 2018-12-02 ENCOUNTER — Other Ambulatory Visit: Payer: Self-pay | Admitting: Oncology

## 2018-12-05 ENCOUNTER — Other Ambulatory Visit: Payer: Self-pay

## 2018-12-05 ENCOUNTER — Other Ambulatory Visit: Payer: Self-pay | Admitting: *Deleted

## 2018-12-05 ENCOUNTER — Ambulatory Visit
Admission: RE | Admit: 2018-12-05 | Discharge: 2018-12-05 | Disposition: A | Discharge status: Home / Self Care | Payer: Medicare Other | Source: Ambulatory Visit | Attending: Oncology | Admitting: Oncology

## 2018-12-05 DIAGNOSIS — J984 Other disorders of lung: Secondary | ICD-10-CM | POA: Diagnosis not present

## 2018-12-05 DIAGNOSIS — C541 Malignant neoplasm of endometrium: Secondary | ICD-10-CM | POA: Diagnosis not present

## 2018-12-05 LAB — POCT I-STAT CREATININE: Creatinine, Ser: 0.7 mg/dL (ref 0.44–1.00)

## 2018-12-05 MED ORDER — OXYCODONE HCL ER 10 MG PO T12A: 10.0000 mg | EXTENDED_RELEASE_TABLET | Freq: Two times a day (BID) | ORAL | 0 refills | Status: DC

## 2018-12-05 MED ORDER — IOHEXOL 300 MG/ML  SOLN
75.0000 mL | Freq: Once | INTRAMUSCULAR | Status: AC | PRN
  Administered 2018-12-05: 75 mL via INTRAVENOUS

## 2018-12-05 NOTE — Telephone Encounter (Signed)
Patient called Vickie Barton requesting refill of oxycontin  As mandated by the [place] STOP Act (Strengthen Opioid Misuse Prevention), the [place] Controlled Substance Reporting System ([place]) was reviewed for this patient.  Below is the past 70-months of controlled substance prescriptions as displayed by the registry.  I have personally consulted with my supervising physician, Dr. Grayland Ormond, who agrees that continuation of opiate therapy is medically appropriate at this time and agrees to provide continual monitoring, including urine/blood drug screens, as indicated. Prescription sent electronically using [employer] secure transmission to requested pharmacy.   [place] Reviewed & appropriate. PDMP updated in EPic.   Beckey Rutter, DNP, AGNP-C [place] [facility] [phone] (work cell) [phone] (office)

## 2018-12-06 ENCOUNTER — Other Ambulatory Visit: Payer: Self-pay | Admitting: *Deleted

## 2018-12-06 DIAGNOSIS — G893 Neoplasm related pain (acute) (chronic): Secondary | ICD-10-CM

## 2018-12-06 MED ORDER — OXYCODONE HCL 10 MG PO TABS: 10.0000 mg | ORAL_TABLET | ORAL | 0 refills | Status: DC | PRN

## 2018-12-08 ENCOUNTER — Encounter: Payer: Self-pay | Admitting: Oncology

## 2018-12-08 ENCOUNTER — Inpatient Hospital Stay: Payer: Medicare Other | Attending: Hospice and Palliative Medicine | Admitting: Hospice and Palliative Medicine

## 2018-12-08 ENCOUNTER — Inpatient Hospital Stay (HOSPITAL_BASED_OUTPATIENT_CLINIC_OR_DEPARTMENT_OTHER): Payer: Medicare Other | Admitting: Oncology

## 2018-12-08 DIAGNOSIS — Z515 Encounter for palliative care: Secondary | ICD-10-CM

## 2018-12-08 DIAGNOSIS — C541 Malignant neoplasm of endometrium: Secondary | ICD-10-CM

## 2018-12-08 NOTE — Progress Notes (Signed)
Called patient for Telehealth visit via telephone.  Patient denies any nausea, vomiting, constipation, SOB or pain.  Patient states appetite is normal.  Has had some diarrhea this week due to CT contrast.

## 2018-12-08 NOTE — Progress Notes (Signed)
Virtual Visit via Telephone Note  I connected with Vickie Barton on 12/08/18 at  2:00 PM EDT by telephone and verified that I am speaking with the correct person using two identifiers.   I discussed the limitations, risks, security and privacy concerns of performing an evaluation and management service by telephone and the availability of in person appointments. I also discussed with the patient that there may be a patient responsible charge related to this service. The patient expressed understanding and agreed to proceed.   History of Present Illness: Palliative Care consult requested for this 85 y.o. 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  PMH also notable for history of PE/DVT on anticoagulation with Eliquis and status post IVC filter, left hydronephrosis and hydroureter status post ureteral stent placement.  Patient has had chronic pain from progressive metastatic disease.  CT on 05/20/2018 reveals large left pelvic sidewall mass, which had increased in size from previous study and likely is exacerbating pain. She is referred to palliative care to help with symptom management and to address goals.   Observations/Objective: I spoke with patient by phone.  She reports doing reasonably well without any significant changes or concerns.  She says the pain is stable on regimen of oxycodone.  She tried OxyContin but did not feel that it worked and does not wish to take it again.  Patient had follow-up CT scan of chest, abdomen and pelvis.  Left iliac and left pelvic sidewall mass was very slightly decreased in size compared to prior scan in February 2020.  Patient has follow-up telephone visit today with Dr. Grayland Ormond with most likely plan for surveillance imaging in 3 to 6 months.  However,  patient says that she found the contrast to be difficult to take and is not sure that she would want repeat imaging.  Assessment and Plan: Neoplasm related pain: -Continue oxycodone 10 mg every 4 hours as needed  Goals of care: -Continue current scope of treatment -Disease surveillance  Follow Up Instructions: -RTC in 6 months   I discussed the assessment and treatment plan with the patient. The patient was provided an opportunity to ask questions and all were answered. The patient agreed with the plan and demonstrated an understanding of the instructions.   The patient was advised to call back or seek an in-person evaluation if the symptoms worsen or if the condition fails to improve as anticipated.  I provided 8 minutes of non-face-to-face time during this encounter.   Irean Hong, NP

## 2018-12-26 ENCOUNTER — Other Ambulatory Visit: Payer: Self-pay | Admitting: *Deleted

## 2018-12-26 DIAGNOSIS — G893 Neoplasm related pain (acute) (chronic): Secondary | ICD-10-CM

## 2018-12-26 MED ORDER — OXYCODONE HCL 10 MG PO TABS: 10.0000 mg | ORAL_TABLET | ORAL | 0 refills | Status: DC | PRN

## 2018-12-29 ENCOUNTER — Other Ambulatory Visit: Payer: Federal, State, Local not specified - PPO | Admitting: Nurse Practitioner

## 2019-01-23 ENCOUNTER — Other Ambulatory Visit: Payer: Self-pay

## 2019-01-23 ENCOUNTER — Encounter: Payer: Self-pay | Admitting: Nurse Practitioner

## 2019-01-23 ENCOUNTER — Other Ambulatory Visit: Payer: Medicare Other | Admitting: Nurse Practitioner

## 2019-01-23 DIAGNOSIS — Z515 Encounter for palliative care: Secondary | ICD-10-CM | POA: Diagnosis not present

## 2019-01-23 NOTE — Progress Notes (Signed)
[place] Consult Note Telephone: [phone]  Fax: [phone]  PATIENT NAME: Vickie Barton DOB: January 27, 1931 MRN: [id number]  PRIMARY CARE PROVIDER:   [name], MD  REFERRING PROVIDER:  [name], MD [address] [facility] [place],  [place] [address] RESPONSIBLE PARTY:   Self  Due to the COVID-19 crisis, this visit was done via telemedicine from my office and it was initiated and consent by this patient and or family.I did a phone visit in place of Telehealth as Telehealth was not available    RECOMMENDATIONS and PLAN: 1.ACP: DNR in place  2.Generalized weaknesssecondary tocongestive heart failure, endometrial cancerimproving;Encourage energy conservation and rest times.  3.Chronic painsecondary tocancerwill continue to monitor on pain scale, monitor efficacy vs adverse side effects;discussed to add tylenol as needed and maximize oxycodone every 4 hours  4.Palliative care encounter Palliative medicine team will continue to support patient, patient's family, and medical team. Visit consisted of counseling and education dealing with the complex and emotionally intense issues of symptom management and palliative care in the setting of serious and potentially life-threatening illness  I spent 40 minutes providing this consultation,  from 3:00pm to 3:40pm. More than 50% of the time in this consultation was spent coordinating communication.   HISTORY OF PRESENT ILLNESS:  MARTICIA PUSATERI is a 85 y.o. year old female with multiple medical problems including Congestive heart failure, DVT 03/2017, endometrial cancer 01/2016 with hysterectomy, chemo, radiation also internalbradytherapy on 02/2018, hypothyroidism, hypertension, hyperlipidemia, arthritis, tonsillectomy, TEE without cardioversion, laparoscopic bilateral salpingooophorectomy. Cystoscopy with ureteral stent placement. Dr. Grayland Ormond on 9 / 17 /  2020 for follow up  with plan consisting of  continuing Letrozole  and follow-up CT scan in 6 months. Pain well controlled with palliative care on Oxycodone. I called Ms. Stegner for scheduled palliative care telemedicine follow-up visit. We talked about purpose of palliative care follow-up visit and Ms. Chim in agreement. We talked about how she was feeling today. She replies that she is doing well. She went to visit her niece in [place] then several weeks and has now returned. She shared that her trip was good. She did not have issues with pain the Oxycodone has been effective pain relief. She talked about her foot which has improve to where she is able to wear a shoe around the bandage. We talked about her appetite which has improved also. She continues to be mobile, independent. She talked about her family caring for her. We talked about medical goals of care and she verbalize that she is not sure if she wants to have a repeat scan done. She shared that she got really sick after the CT scan. She ended up having diarrhea and feeling bad for days. She shared that she's not sure if she wants to run the risk of being exposed to something or if it was something that she reacted to. We talked about having some time to make that decision she has time to think about it. We talked about medical goals of care she continued to treat conservatively with focus on Comfort. We talked about role of palliative care and plan of care. Discuss follow-up visit in 2 months if needed or sooner should she declined. Ms. Cortez in agreement. Therapeutic listening and emotional support provided. Contact information provided. Questions answered with satisfaction. Appointment scheduled.  Palliative Care was asked to help to continue to address goals of care.   CODE STATUS: DNR  PPS: 60% HOSPICE  ELIGIBILITY/DIAGNOSIS: TBD  PAST MEDICAL HISTORY:  Past Medical History:  Diagnosis Date   Arthritis    CHF (congestive heart  failure) ([place])    Constipation    DVT (deep venous thrombosis) ([place]) 03/2017   left leg   Endometrial cancer ([place]) 01/28/2016   Hysterectomy, chemo + rad tx's, also internal brachytherapy on 02/2018.    Hyperlipidemia    Hypertension    Hypothyroidism     SOCIAL HX:  Social History   Tobacco Use   Smoking status: Former Smoker   Smokeless tobacco: Never Used  Substance Use Topics   Alcohol use: No    ALLERGIES:  Allergies  Allergen Reactions   Sulfa Antibiotics Rash   Zinc Rash     PERTINENT MEDICATIONS:  Outpatient Encounter Medications as of 01/23/2019  Medication Sig   atorvastatin (LIPITOR) 10 MG tablet Take 1 tablet (10 mg total) by mouth daily at 6 PM.   ELIQUIS 5 MG TABS tablet TAKE 2 TABLETS BY MOUTH TWICE A DAY   furosemide (LASIX) 20 MG tablet Take 20 mg by mouth 2 (two) times daily.   letrozole (FEMARA) 2.5 MG tablet TAKE 1 TABLET BY MOUTH EVERY DAY   levothyroxine (SYNTHROID, LEVOTHROID) 150 MCG tablet TAKE 150 MCG BY MOUTH DAILY BEFORE BREAKFAST ON EMPTY STOMACH WITH A GLASS OF WATER 30-60MINS BEFORE BREAKFAST   metoprolol tartrate (LOPRESSOR) 25 MG tablet Take 0.5 tablets (12.5 mg total) by mouth 2 (two) times daily.   ondansetron (ZOFRAN) 8 MG tablet Take 1 tablet (8 mg total) by mouth every 8 (eight) hours as needed for nausea or vomiting.   Oxycodone HCl 10 MG TABS Take 1 tablet (10 mg total) by mouth every 4 (four) hours as needed (for moderate to severe pain).   No facility-administered encounter medications on file as of 01/23/2019.     PHYSICAL EXAM:  Deferred  Vickie Barton Z Latressa Harries, NP

## 2019-01-24 ENCOUNTER — Other Ambulatory Visit: Payer: Federal, State, Local not specified - PPO | Admitting: Nurse Practitioner

## 2019-01-31 ENCOUNTER — Other Ambulatory Visit: Payer: Self-pay | Admitting: *Deleted

## 2019-01-31 DIAGNOSIS — G893 Neoplasm related pain (acute) (chronic): Secondary | ICD-10-CM

## 2019-01-31 MED ORDER — OXYCODONE HCL 10 MG PO TABS: 10.0000 mg | ORAL_TABLET | ORAL | 0 refills | Status: DC | PRN

## 2019-03-07 ENCOUNTER — Other Ambulatory Visit: Payer: Self-pay | Admitting: *Deleted

## 2019-03-07 DIAGNOSIS — G893 Neoplasm related pain (acute) (chronic): Secondary | ICD-10-CM

## 2019-03-07 NOTE — Telephone Encounter (Signed)
Forwarding to [employer], NP as patient is followed by palliative care.

## 2019-03-08 MED ORDER — OXYCODONE HCL 10 MG PO TABS: 10.0000 mg | ORAL_TABLET | ORAL | 0 refills | Status: DC | PRN

## 2019-03-13 ENCOUNTER — Other Ambulatory Visit: Payer: Medicare Other | Admitting: Nurse Practitioner

## 2019-03-13 ENCOUNTER — Encounter: Payer: Self-pay | Admitting: Nurse Practitioner

## 2019-03-13 ENCOUNTER — Other Ambulatory Visit: Payer: Self-pay

## 2019-03-13 ENCOUNTER — Telehealth: Payer: Self-pay | Admitting: Nurse Practitioner

## 2019-03-13 DIAGNOSIS — C541 Malignant neoplasm of endometrium: Secondary | ICD-10-CM

## 2019-03-13 DIAGNOSIS — Z515 Encounter for palliative care: Secondary | ICD-10-CM

## 2019-03-13 NOTE — Telephone Encounter (Signed)
Vickie Barton answered the phone for palliative care follow up visit, see ov visit

## 2019-03-13 NOTE — Progress Notes (Signed)
[facility] Consult Note Telephone: [phone]  Fax: [phone]  PATIENT NAME: Vickie Barton DOB: 12-22-1930 MRN: [id number]  PRIMARY CARE PROVIDER:   [name], MD  REFERRING PROVIDER:  [name], MD [address] [facility] [place],  [place] [address]  RESPONSIBLE PARTY:   Self   Due to the COVID-19 crisis, this visit was done via telemedicine from my office and it was initiated and consent by this patient and or family.I did a phone visit in place of Telehealth as Telehealth was not available    RECOMMENDATIONS and PLAN: 1.ACP: DNR in place  2.Generalized weaknesssecondary tocongestive heart failure, endometrial cancerimproving;Encourage energy conservation and rest times.  3.Chronic painsecondary tocancerwill continue to monitor on pain scale, monitor efficacy vs adverse side effects;discussed to add tylenol as needed and maximize oxycodone every 4 hours  4.Palliative care encounter Palliative medicine team will continue to support patient, patient's family, and medical team. Visit consisted of counseling and education dealing with the complex and emotionally intense issues of symptom management and palliative care in the setting of serious and potentially life-threatening illness  I spent 31 minutes providing this consultation,  from 3:00pm to 3:31. More than 50% of the time in this consultation was spent coordinating communication.   HISTORY OF PRESENT ILLNESS:  Vickie Barton is a 85 y.o. year old female with multiple medical problems including Congestive heart failure, DVT 03/2017, endometrial cancer 01/2016 with hysterectomy, chemo, radiation also internalbradytherapy on 02/2018, hypothyroidism, hypertension, hyperlipidemia, arthritis, tonsillectomy, TEE without cardioversion, laparoscopic bilateral salpingooophorectomy. Cystoscopy with ureteral stent placement. Dr. Grayland Ormond on 9 / 17  / 2020 for follow up with plan consisting of continuing Letrozole.I called Vickie Barton for scheduled palliative follow-up telemedicine telephonic visit. We talked about purpose of visit and Vickie Barton  is in agreement. We talked about how she has been feeling. 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 Vickie Barton endorses that when she does have pain in her leg that she does take an oxycodone with relief. We talked about our Thanksgiving holiday and upcoming Christmas plans with coded pandemic. We talked about her foot which is healed. We talked about her being able to put a shoe on. Vickie Barton endorses that she's doing much better. Vickie Barton endorses her appetite is good. Vickie Barton expresses that she does have some difficulty with constipation. She has been using Senokot. Vickie Barton endorses she has repeat CT scans in March. Vickie Barton and I talked at length about to her visit with Dr. Grayland Ormond and that the medication has been working. Vickie Barton is looking forward to her repeat scan and hopeful that it continues to improve. We talked about medical goals of care. Therapeutic listening and emotional support provided. Discussed will set a follow-up appointment for 2 months if needed or sooner should she declined. Vickie Barton in agreement.  Palliative Care was asked to help to continue to address goals of care.   CODE STATUS: DNR  PPS: 70% HOSPICE ELIGIBILITY/DIAGNOSIS: TBD  PAST MEDICAL HISTORY:  Past Medical History:  Diagnosis Date  . Arthritis   . CHF (congestive heart failure) ([place])   . Constipation   . DVT (deep venous thrombosis) ([place]) 03/2017   left leg  . Endometrial cancer ([place]) 01/28/2016   Hysterectomy, chemo + rad tx's, also internal brachytherapy on 02/2018.   [name] Hyperlipidemia   . Hypertension   . Hypothyroidism     SOCIAL HX:  Social History  Tobacco Use  . Smoking status: Former Research scientist (life sciences)  . Smokeless tobacco: Never Used  Substance Use Topics  .  Alcohol use: No    ALLERGIES:  Allergies  Allergen Reactions  . Sulfa Antibiotics Rash  . Zinc Rash     PERTINENT MEDICATIONS:  Outpatient Encounter Medications as of 03/13/2019  Medication Sig  . atorvastatin (LIPITOR) 10 MG tablet Take 1 tablet (10 mg total) by mouth daily at 6 PM.  . ELIQUIS 5 MG TABS tablet TAKE 2 TABLETS BY MOUTH TWICE A DAY  . furosemide (LASIX) 20 MG tablet Take 20 mg by mouth 2 (two) times daily.  [name] letrozole (FEMARA) 2.5 MG tablet TAKE 1 TABLET BY MOUTH EVERY DAY  . levothyroxine (SYNTHROID, LEVOTHROID) 150 MCG tablet TAKE 150 MCG BY MOUTH DAILY BEFORE BREAKFAST ON EMPTY STOMACH WITH A GLASS OF WATER 30-60MINS BEFORE BREAKFAST  . metoprolol tartrate (LOPRESSOR) 25 MG tablet Take 0.5 tablets (12.5 mg total) by mouth 2 (two) times daily.  . ondansetron (ZOFRAN) 8 MG tablet Take 1 tablet (8 mg total) by mouth every 8 (eight) hours as needed for nausea or vomiting.  . Oxycodone HCl 10 MG TABS Take 1 tablet (10 mg total) by mouth every 4 (four) hours as needed (for moderate to severe pain).   No facility-administered encounter medications on file as of 03/13/2019.    PHYSICAL EXAM:   Deferred  Lenea Bywater Z [place] Schreiner, NP

## 2019-03-28 ENCOUNTER — Other Ambulatory Visit: Payer: Self-pay | Admitting: Oncology

## 2019-04-20 ENCOUNTER — Other Ambulatory Visit: Payer: Self-pay | Admitting: *Deleted

## 2019-04-20 DIAGNOSIS — G893 Neoplasm related pain (acute) (chronic): Secondary | ICD-10-CM

## 2019-04-20 MED ORDER — OXYCODONE HCL 10 MG PO TABS: 10.0000 mg | ORAL_TABLET | ORAL | 0 refills | Status: DC | PRN

## 2019-05-15 ENCOUNTER — Encounter: Payer: Self-pay | Admitting: Nurse Practitioner

## 2019-05-15 ENCOUNTER — Other Ambulatory Visit: Payer: Medicare Other | Admitting: Nurse Practitioner

## 2019-05-15 ENCOUNTER — Other Ambulatory Visit: Payer: Self-pay

## 2019-05-15 DIAGNOSIS — C541 Malignant neoplasm of endometrium: Secondary | ICD-10-CM | POA: Diagnosis not present

## 2019-05-15 DIAGNOSIS — Z515 Encounter for palliative care: Secondary | ICD-10-CM | POA: Diagnosis not present

## 2019-05-15 NOTE — Progress Notes (Signed)
[facility] Consult Note Telephone: [phone]  Fax: [phone]  PATIENT NAME: Vickie Barton DOB: 1930/12/28 MRN: [id number]  PRIMARY CARE PROVIDER:   [name], MD  REFERRING PROVIDER:  [name], MD [address] [facility] [place],  [place] [address]  RESPONSIBLE PARTY:   Self  Due to the COVID-19 crisis, this visit was done via telemedicine from my office and it was initiated and consent by this patient and or family.I did a phone visit in place of Telehealth as Telehealth was not available   RECOMMENDATIONS and PLAN: 1.ACP: DNR in place  2.Chronic painsecondary tocancerwill continue to monitor on pain scale, monitor efficacy vs adverse side effects;discussed to add tylenol as needed and maximize oxycodone every 4 hours  4.Palliative care encounter Palliative medicine team will continue to support patient, patient's family, and medical team. Visit consisted of counseling and education dealing with the complex and emotionally intense issues of symptom management and palliative care in the setting of serious and potentially life-threatening illness  I spent 45 minutes providing this consultation,  from 1:00pm to 1:45pm. More than 50% of the time in this consultation was spent coordinating communication.   HISTORY OF PRESENT ILLNESS:  Vickie Barton is a 86 y.o. year old female with multiple medical problems including Congestive heart failure, DVT 03/2017, endometrial cancer 01/2016 with hysterectomy, chemo, radiation also internalbradytherapy on 02/2018, hypothyroidism, hypertension, hyperlipidemia, arthritis, tonsillectomy, TEE without cardioversion, laparoscopic bilateral salpingooophorectomy.I called [name] for schedule telemedicine as video not available telephonic followed palliative care appointment. [name] endoreses she is doing well. [name] does have some intermittent  chronic pain or needs but it is manageable with current regimen. The wound and her foot is healed. [name]  was very happy to be able to wear shoes. [name] denies currently any symptoms of pain or shortness of breath. [name]  endorses her appetite is good and no noted weight loss. [name] endorses that she is tolerating the Letrozole. We talked about her visit with her niece in [place] and she will return in April for another three weeks. [name]  is practicing social distancing. [name] talked about the last test she underwent with barium and she became very sick with diarrhea. [name] endorses that she canceled her upcoming appointment with Dr. Grayland Ormond as well as the repeat scans but she would like to reschedule them in June. Ms.Lai is concerned about having the same reaction. Discussed will follow up with atrafen again. Present time [name]  is doing well. [name] has no complaints or concerns. [name] is no complaints or concerns. Discussed medical goals of care. We talked about follow-up visit in two months. [name] in agreement and scheduled. Emotional support provided. Questions answered to satisfaction. Contact information provided.  Palliative Care was asked to help to continue to address goals of care.   CODE STATUS: DNR PPS: 60% HOSPICE ELIGIBILITY/DIAGNOSIS: TBD  PAST MEDICAL HISTORY:  Past Medical History:  Diagnosis Date  . Arthritis   . CHF (congestive heart failure) ([place])   . Constipation   . DVT (deep venous thrombosis) ([place]) 03/2017   left leg  . Endometrial cancer ([place]) 01/28/2016   Hysterectomy, chemo + rad tx's, also internal brachytherapy on 02/2018.   [name] Hyperlipidemia   . Hypertension   . Hypothyroidism     SOCIAL HX:  Social History   Tobacco Use  . Smoking status: Former Research scientist (life sciences)  .  Smokeless tobacco: Never Used  Substance Use Topics  . Alcohol use: No    ALLERGIES:  Allergies  Allergen Reactions    . Sulfa Antibiotics Rash  . Zinc Rash     PERTINENT MEDICATIONS:  Outpatient Encounter Medications as of 05/15/2019  Medication Sig  . atorvastatin (LIPITOR) 10 MG tablet Take 1 tablet (10 mg total) by mouth daily at 6 PM.  . ELIQUIS 5 MG TABS tablet TAKE 2 TABLETS BY MOUTH TWICE A DAY  . furosemide (LASIX) 20 MG tablet Take 20 mg by mouth 2 (two) times daily.  [name] letrozole (FEMARA) 2.5 MG tablet TAKE 1 TABLET BY MOUTH EVERY DAY  . levothyroxine (SYNTHROID, LEVOTHROID) 150 MCG tablet TAKE 150 MCG BY MOUTH DAILY BEFORE BREAKFAST ON EMPTY STOMACH WITH A GLASS OF WATER 30-60MINS BEFORE BREAKFAST  . metoprolol tartrate (LOPRESSOR) 25 MG tablet Take 0.5 tablets (12.5 mg total) by mouth 2 (two) times daily.  . ondansetron (ZOFRAN) 8 MG tablet Take 1 tablet (8 mg total) by mouth every 8 (eight) hours as needed for nausea or vomiting.  . Oxycodone HCl 10 MG TABS Take 1 tablet (10 mg total) by mouth every 4 (four) hours as needed (for moderate to severe pain).   No facility-administered encounter medications on file as of 05/15/2019.    PHYSICAL EXAM:   deferred  Kanda Deluna Ihor Gully, NP

## 2019-05-16 ENCOUNTER — Other Ambulatory Visit: Payer: Self-pay | Admitting: *Deleted

## 2019-05-16 DIAGNOSIS — G893 Neoplasm related pain (acute) (chronic): Secondary | ICD-10-CM

## 2019-05-16 MED ORDER — OXYCODONE HCL 10 MG PO TABS: 10.0000 mg | ORAL_TABLET | ORAL | 0 refills | Status: DC | PRN

## 2019-06-07 ENCOUNTER — Ambulatory Visit: Payer: Medicare Other

## 2019-06-08 ENCOUNTER — Ambulatory Visit: Payer: Medicare Other | Admitting: Oncology

## 2019-06-08 ENCOUNTER — Encounter: Payer: Medicare Other | Admitting: Hospice and Palliative Medicine

## 2019-06-15 ENCOUNTER — Other Ambulatory Visit: Payer: Self-pay | Admitting: *Deleted

## 2019-06-15 ENCOUNTER — Telehealth: Payer: Self-pay | Admitting: *Deleted

## 2019-06-15 DIAGNOSIS — C541 Malignant neoplasm of endometrium: Secondary | ICD-10-CM

## 2019-06-15 DIAGNOSIS — G893 Neoplasm related pain (acute) (chronic): Secondary | ICD-10-CM

## 2019-06-15 MED ORDER — OXYCODONE HCL 10 MG PO TABS: 10.0000 mg | ORAL_TABLET | ORAL | 0 refills | Status: DC | PRN

## 2019-06-15 NOTE — Telephone Encounter (Signed)
Schedule message sent for CT scan

## 2019-06-15 NOTE — Telephone Encounter (Signed)
We can do IV contrast only.

## 2019-06-15 NOTE — Telephone Encounter (Signed)
Patient called asking about Her CT where she does not have to drink contrast, states it makes her sick to drink it and she has not heard back from anyone about getting a scan where she does not have to drink contrast. She cancelled her 06/08/19 CT with contrast. Please advise

## 2019-07-05 ENCOUNTER — Other Ambulatory Visit: Payer: Self-pay | Admitting: *Deleted

## 2019-07-05 ENCOUNTER — Other Ambulatory Visit: Payer: Self-pay | Admitting: Oncology

## 2019-07-05 DIAGNOSIS — G893 Neoplasm related pain (acute) (chronic): Secondary | ICD-10-CM

## 2019-07-06 MED ORDER — OXYCODONE HCL 10 MG PO TABS: 10.0000 mg | ORAL_TABLET | ORAL | 0 refills | Status: DC | PRN

## 2019-07-10 ENCOUNTER — Encounter: Payer: Self-pay | Admitting: Nurse Practitioner

## 2019-07-10 ENCOUNTER — Other Ambulatory Visit: Payer: Medicare Other | Admitting: Nurse Practitioner

## 2019-07-10 ENCOUNTER — Other Ambulatory Visit: Payer: Self-pay

## 2019-07-10 DIAGNOSIS — Z515 Encounter for palliative care: Secondary | ICD-10-CM

## 2019-07-10 DIAGNOSIS — C541 Malignant neoplasm of endometrium: Secondary | ICD-10-CM

## 2019-07-10 NOTE — Progress Notes (Signed)
[facility] Consult Note Telephone: [phone]  Fax: [phone]  PATIENT NAME: Vickie Barton DOB: [birth year] MRN: [id number]  PRIMARY CARE PROVIDER:   [name], MD  REFERRING PROVIDER:  [name], MD [address] [facility] [place],  [place] [address] RESPONSIBLE PARTY:Self  Due to the COVID-19 crisis, this visit was done via telemedicine from my office and it was initiated and consent by this patient and or family.I did a phone visit in place of Telehealth as Telehealth was not available   RECOMMENDATIONS and PLAN: 1.ACP: DNR in place  2.Chronic painsecondary tocancerwill continue to monitor on pain scale, monitor efficacy vs adverse side effects;discussed to add tylenol as needed and maximize oxycodone every 4 hours  4.Palliative care encounter Palliative medicine team will continue to support patient, patient's family, and medical team. Visit consisted of counseling and education dealing with the complex and emotionally intense issues of symptom management and palliative care in the setting of serious and potentially life-threatening illness  I spent 47 minutes providing this consultation,  from 11:00am to 11:47am. More than 50% of the time in this consultation was spent coordinating communication.   HISTORY OF PRESENT ILLNESS:  Vickie Barton is a 86 y.o. year old female with multiple medical problems including Congestive heart failure, DVT 03/2017, endometrial cancer 01/2016 with hysterectomy, chemo, radiation also internalbradytherapy on 02/2018, hypothyroidism, hypertension, hyperlipidemia, arthritis, tonsillectomy, TEE without cardioversion, laparoscopic bilateral salpingooophorectomy. I called [name] for schedule palliative care visit telemedicine telephonic is video not available. [name] and I talked about how she was feeling today. [name] endorses she is doing very well.  We talked about symptoms of pain but she denies. We talked about symptoms of shortness of breath which she denies. We talked about her appetite which is good. We talked about her foot which is now resolved and she is able to wear a shoe. Ms. Stachowicz talked about going to her niece's house again for three weeks in [place] coming up. We talked about her pending scan an appointment with Dr Grayland Ormond, Oncology in June. We talked about Letrozole which sea is continuing to take. We talked about side effects which she does not seem to be experiencing. We talked about medical goals of care. We talked about her current functional ability independent no recent falls, hospitalizations, infections. We talked about covid-19 for what she does declined to receive. We talked about quality of life. We talked about role of palliative care and plan of care. Will follow up in two months if needed or sooner should she declined. [name] in agreement with appointment scheduled. Therapeutic listening and emotional support provided. Questions answered to satisfaction. Contact information  Palliative Care was asked to help to continue to address goals of care.   CODE STATUS: DNR  PPS: 60% HOSPICE ELIGIBILITY/DIAGNOSIS: TBD  PAST MEDICAL HISTORY:  Past Medical History:  Diagnosis Date  . Arthritis   . CHF (congestive heart failure) ([place])   . Constipation   . DVT (deep venous thrombosis) ([place] [facility]) 03/2017   left leg  . Endometrial cancer ([place]) 01/28/2016   Hysterectomy, chemo + rad tx's, also internal brachytherapy on 02/2018.   [name] Hyperlipidemia   . Hypertension   . Hypothyroidism     SOCIAL HX:  Social History   Tobacco Use  . Smoking status: Former Research scientist (life sciences)  . Smokeless tobacco: Never Used  Substance Use Topics  . Alcohol use: No    ALLERGIES:  Allergies  Allergen Reactions  . Sulfa Antibiotics Rash  . Zinc Rash     PERTINENT MEDICATIONS:  Outpatient Encounter Medications as of 07/10/2019  Medication  Sig  . atorvastatin (LIPITOR) 10 MG tablet Take 1 tablet (10 mg total) by mouth daily at 6 PM.  . ELIQUIS 5 MG TABS tablet TAKE 2 TABLETS BY MOUTH TWICE A DAY  . furosemide (LASIX) 20 MG tablet Take 20 mg by mouth 2 (two) times daily.  [name] letrozole (FEMARA) 2.5 MG tablet TAKE 1 TABLET BY MOUTH EVERY DAY  . levothyroxine (SYNTHROID, LEVOTHROID) 150 MCG tablet TAKE 150 MCG BY MOUTH DAILY BEFORE BREAKFAST ON EMPTY STOMACH WITH A GLASS OF WATER 30-60MINS BEFORE BREAKFAST  . metoprolol tartrate (LOPRESSOR) 25 MG tablet Take 0.5 tablets (12.5 mg total) by mouth 2 (two) times daily.  . ondansetron (ZOFRAN) 8 MG tablet Take 1 tablet (8 mg total) by mouth every 8 (eight) hours as needed for nausea or vomiting.  . Oxycodone HCl 10 MG TABS Take 1 tablet (10 mg total) by mouth every 4 (four) hours as needed (for moderate to severe pain).   No facility-administered encounter medications on file as of 07/10/2019.    PHYSICAL EXAM:   Deferred  Claudett Bayly Z Add Dinapoli, NP

## 2019-08-08 ENCOUNTER — Other Ambulatory Visit: Payer: Self-pay | Admitting: *Deleted

## 2019-08-08 DIAGNOSIS — G893 Neoplasm related pain (acute) (chronic): Secondary | ICD-10-CM

## 2019-08-08 MED ORDER — LETROZOLE 2.5 MG PO TABS: 2.5000 mg | ORAL_TABLET | Freq: Every day | ORAL | 1 refills | Status: DC

## 2019-08-08 MED ORDER — OXYCODONE HCL 10 MG PO TABS: 10.0000 mg | ORAL_TABLET | ORAL | 0 refills | Status: DC | PRN

## 2019-08-08 NOTE — Telephone Encounter (Signed)
Patient called cancer center requesting refill of Oxycodone 10 mg q 4 hours for pain prn.  As mandated by the [place] STOP Act (Strengthen Opioid Misuse Prevention), the [place] Controlled Substance Reporting System ([place]) was reviewed for this patient. Below is the past 2-months of controlled substance prescriptions as displayed by the registry. I have personally consulted with my supervising physician, Dr. Grayland Ormond who agrees that continuation of opiate therapy is medically appropriate at this time and agrees to provide continual monitoring, including urine/blood drug screens, as indicated. Refill is appropriate on or after 07/25/2019.  NCCSRS reviewed: Reviewed and acceptable.    Faythe Casa, NP 08/08/2019 10:42 AM [phone]

## 2019-08-28 ENCOUNTER — Other Ambulatory Visit: Payer: Self-pay | Admitting: *Deleted

## 2019-08-28 DIAGNOSIS — G893 Neoplasm related pain (acute) (chronic): Secondary | ICD-10-CM

## 2019-08-29 ENCOUNTER — Ambulatory Visit: Payer: Medicare Other | Admitting: Oncology

## 2019-08-29 ENCOUNTER — Encounter: Payer: Medicare Other | Admitting: Hospice and Palliative Medicine

## 2019-08-29 MED ORDER — OXYCODONE HCL 10 MG PO TABS: 10.0000 mg | ORAL_TABLET | ORAL | 0 refills | Status: DC | PRN

## 2019-08-29 NOTE — Telephone Encounter (Signed)
Patient called cancer center requesting refill of oxycodone 10 mg tablets every 4 hours as needed for pain.  As mandated by the [place] STOP Act (Strengthen Opioid Misuse Prevention), the [place] Controlled Substance Reporting System ([place]) was reviewed for this patient. Below is the past 36-months of controlled substance prescriptions as displayed by the registry. I have personally consulted with my supervising physician, Dr. Grayland Ormond, who agrees that continuation of opiate therapy is medically appropriate at this time and agrees to provide continual monitoring, including urine/blood drug screens, as indicated. Refill is appropriate on or after 08/22/2019.  NCCSRS reviewed: Reviewed and acceptable.    Faythe Casa, NP 08/29/2019 3:25 PM [phone]

## 2019-09-04 NOTE — Progress Notes (Signed)
[facility]  [phone] [phone]  ID: Marinda Elk Rolph OB: Sep 30, 1930  MR#: [id number]  [facility]#:[id number]  Patient Care Team: [name], MD as PCP - General (Family Medicine) [name], MD as PCP - Cardiology (Cardiology) [name], NP as Nurse Practitioner (Hospice and Palliative Medicine) Borders, [name], NP as Nurse Practitioner (Hospice and Palliative Medicine) [name], MD as Consulting Physician (Oncology)   CHIEF COMPLAINT: Stage IIIa high-grade serous endometrial carcinoma.  INTERVAL HISTORY: Patient returns to clinic today for further evaluation, discussion of her imaging results, and treatment planning.  She has noticed an increased pain in her left hip, but is still relatively well controlled with her current narcotic regimen.  Her chronic leg edema is unchanged. She does not complain of weakness or fatigue.  She has no neurologic complaints. She denies any recent fevers or illnesses.  She denies any chest pain, shortness of breath, cough, or hemoptysis.  She denies any nausea, vomiting, constipation, or diarrhea.  She has no urinary complaints.  Patient offers no further specific complaints today.  REVIEW OF SYSTEMS:   Review of Systems  Constitutional: Negative.  Negative for fever, malaise/fatigue and weight loss.  Respiratory: Negative.  Negative for cough and shortness of breath.   Cardiovascular: Positive for leg swelling. Negative for chest pain.  Gastrointestinal: Negative.  Negative for abdominal pain, constipation, nausea and vomiting.  Genitourinary: Negative.  Negative for dysuria.  Musculoskeletal: Positive for joint pain.  Skin: Negative.  Negative for rash.  Neurological: Negative.  Negative for sensory change, focal weakness, weakness and headaches.  Psychiatric/Behavioral: Negative.  The patient is not nervous/anxious.     As per HPI. Otherwise, a complete review of systems is  negative.  PAST MEDICAL HISTORY: Past Medical History:  Diagnosis Date  . Arthritis   . CHF (congestive heart failure) ([facility])   . Constipation   . DVT (deep venous thrombosis) ([facility]) 03/2017   left leg  . Endometrial cancer ([facility]) 01/28/2016   Hysterectomy, chemo + rad tx's, also internal brachytherapy on 02/2018.   [name] Hyperlipidemia   . Hypertension   . Hypothyroidism     PAST SURGICAL HISTORY: Past Surgical History:  Procedure Laterality Date  . CYSTOSCOPY W/ RETROGRADES Left 01/01/2017   Procedure: CYSTOSCOPY WITH RETROGRADE PYELOGRAM;  Surgeon: Nickie Retort, MD;  Location: [facility];  Service: Urology;  Laterality: Left;  . CYSTOSCOPY W/ RETROGRADES Left 04/23/2017   Procedure: CYSTOSCOPY WITH RETROGRADE PYELOGRAM;  Surgeon: Abbie Sons, MD;  Location: [facility];  Service: Urology;  Laterality: Left;  . CYSTOSCOPY W/ URETERAL STENT PLACEMENT Left 04/23/2017   Procedure: CYSTOSCOPY WITH STENT REMOVAL;  Surgeon: Abbie Sons, MD;  Location: [facility];  Service: Urology;  Laterality: Left;  . CYSTOSCOPY WITH STENT PLACEMENT Left 01/01/2017   Procedure: CYSTOSCOPY WITH STENT PLACEMENT;  Surgeon: Nickie Retort, MD;  Location: [facility];  Service: Urology;  Laterality: Left;  . DILATION AND CURETTAGE OF UTERUS    . HYSTEROSCOPY WITH D & C N/A 01/28/2016   Procedure: DILATATION AND CURETTAGE /HYSTEROSCOPY;  Surgeon: [name], MD;  Location: [facility];  Service: Gynecology;  Laterality: N/A;  . LAPAROSCOPIC BILATERAL SALPINGO OOPHERECTOMY Bilateral 02/26/2016   Procedure: LAPAROSCOPIC BILATERAL SALPINGO OOPHORECTOMY;  Surgeon: [name], MD;  Location: [facility];  Service: Gynecology;  Laterality: Bilateral;  . LAPAROSCOPIC HYSTERECTOMY N/A 02/26/2016   Procedure: HYSTERECTOMY TOTAL LAPAROSCOPIC;  Surgeon: [name], MD;  Location: [facility];  Service:  Gynecology;  Laterality: N/A;  . SENTINEL NODE BIOPSY N/A 02/26/2016   Procedure: SENTINEL NODE BIOPSY;  Surgeon:  [name], MD;  Location: [facility];  Service: Gynecology;  Laterality: N/A;  . TEE WITHOUT CARDIOVERSION N/A 06/01/2018   Procedure: TRANSESOPHAGEAL ECHOCARDIOGRAM (TEE);  Surgeon: [name], MD;  Location: [facility];  Service: Cardiovascular;  Laterality: N/A;  . TONSILLECTOMY      FAMILY HISTORY: Family History  Problem Relation Age of Onset  . Diabetes Father   . Heart disease Father   . Hypertension Father   . Stroke Father   . Diabetes Paternal Grandmother   . Heart disease Mother   . Hypertension Mother   . Stroke Mother   . Hypertension Sister   . Thyroid disease Sister   . Hypertension Brother     ADVANCED DIRECTIVES (Y/N):  N  HEALTH MAINTENANCE: Social History   Tobacco Use  . Smoking status: Former Research scientist (life sciences)  . Smokeless tobacco: Never Used  Vaping Use  . Vaping Use: Never used  Substance Use Topics  . Alcohol use: No  . Drug use: No     Colonoscopy:  PAP:  Bone density:  Lipid panel:  Allergies  Allergen Reactions  . Sulfa Antibiotics Rash  . Zinc Rash    Current Outpatient Medications  Medication Sig Dispense Refill  . atorvastatin (LIPITOR) 10 MG tablet Take 1 tablet (10 mg total) by mouth daily at 6 PM. 30 tablet 0  . ELIQUIS 5 MG TABS tablet TAKE 2 TABLETS BY MOUTH TWICE A DAY 120 tablet 5  . furosemide (LASIX) 20 MG tablet Take 20 mg by mouth 2 (two) times daily.    [name] letrozole (FEMARA) 2.5 MG tablet Take 1 tablet (2.5 mg total) by mouth daily. 90 tablet 1  . levothyroxine (SYNTHROID, LEVOTHROID) 150 MCG tablet TAKE 150 MCG BY MOUTH DAILY BEFORE BREAKFAST ON EMPTY STOMACH WITH A GLASS OF WATER 30-60MINS BEFORE BREAKFAST  2  . metoprolol tartrate (LOPRESSOR) 25 MG tablet Take 0.5 tablets (12.5 mg total) by mouth 2 (two) times daily. 60 tablet 0  . ondansetron (ZOFRAN) 8 MG tablet Take 1 tablet (8 mg total) by mouth every 8 (eight) hours as needed for nausea or vomiting. 20 tablet 0  . Oxycodone HCl 10 MG TABS Take 1 tablet (10 mg total)  by mouth every 4 (four) hours as needed (for moderate to severe pain). 90 tablet 0   No current facility-administered medications for this visit.     OBJECTIVE: Vitals:   09/07/19 1017  BP: (!) 146/72  Pulse: 64  Resp: 20  Temp: 98.4 F (36.9 C)  SpO2: 97%     Body mass index is 24 kg/m.    ECOG FS:1 - Symptomatic but completely ambulatory  General: Well-developed, well-nourished, no acute distress. Eyes: Pink conjunctiva, anicteric sclera. HEENT: Normocephalic, moist mucous membranes. Lungs: No audible wheezing or coughing. Heart: Regular rate and rhythm. Abdomen: Soft, nontender, no obvious distention. Musculoskeletal: 2-3+ bilateral lower extremity edema. Neuro: Alert, answering all questions appropriately. Cranial nerves grossly intact. Skin: No rashes or petechiae noted. Psych: Normal affect.  LAB RESULTS:  Lab Results  Component Value Date   NA 137 09/05/2019   K 3.4 (L) 09/05/2019   CL 99 09/05/2019   CO2 27 09/05/2019   GLUCOSE 116 (H) 09/05/2019   BUN 11 09/05/2019   CREATININE 0.74 09/05/2019   CALCIUM 8.7 (L) 09/05/2019   PROT 7.8 09/05/2019   ALBUMIN 3.9 09/05/2019   AST  19 09/05/2019   ALT 8 09/05/2019   ALKPHOS 131 (H) 09/05/2019   BILITOT 0.8 09/05/2019   GFRNONAA >60 09/05/2019   GFRAA >60 09/05/2019    Lab Results  Component Value Date   WBC 5.2 09/05/2019   NEUTROABS 3.5 09/05/2019   HGB 12.1 09/05/2019   HCT 37.5 09/05/2019   MCV 89.7 09/05/2019   PLT 244 09/05/2019     STUDIES: CT Abdomen Pelvis W Contrast  Result Date: 09/05/2019 CLINICAL DATA:  Endometrial cancer status post TAHBSO 02/26/2016, chemotherapy and radiation therapy. Ongoing chemotherapy. Restaging. EXAM: CT ABDOMEN AND PELVIS WITH CONTRAST TECHNIQUE: Multidetector CT imaging of the abdomen and pelvis was performed using the standard protocol following bolus administration of intravenous contrast. CONTRAST:  56mL OMNIPAQUE IOHEXOL 300 MG/ML  SOLN COMPARISON:  12/05/2018  CT abdomen/pelvis. FINDINGS: Lower chest: Stable small parenchymal bands at the dependent lung bases bilaterally. No acute abnormality at the lung bases. Hepatobiliary: Normal liver size. No liver masses. Stable small venovenous shunt in the lateral segment left liver lobe. Phrygian cap in the fundal gallbladder with possible layering tiny gallstone. Otherwise normal gallbladder. No biliary ductal dilatation. CBD diameter 6 mm. Pancreas: Normal, with no mass or duct dilation. Spleen: Normal size spleen. Subcentimeter hypodense medial splenic lesion is too small to characterize and unchanged, considered benign. No new splenic lesions. Adrenals/Urinary Tract: Normal adrenals. No hydronephrosis. Hypodense 1.6 cm posterior interpolar right renal cortical lesion is stable in size, presumably a benign cyst. Separate simple 2.1 cm medial interpolar right renal cyst. Normal nondistended bladder. Stomach/Bowel: Normal non-distended stomach. Normal caliber small bowel with no small bowel wall thickening. Normal appendix. Moderate diffuse colonic diverticulosis with no large bowel wall thickening or significant pericolonic fat stranding. Vascular/Lymphatic: Atherosclerotic nonaneurysmal abdominal aorta. IVC filter in stable position below the level of the renal veins. Patent portal, splenic, hepatic and renal veins. No pathologically enlarged lymph nodes in the abdomen or pelvis. Reproductive: Status post hysterectomy, with no abnormal findings at the vaginal cuff. No right adnexal mass. Large left pelvic sidewall 9.9 x 4.8 cm mass with heterogeneous enhancement (series 2/image 54) invading the left iliopsoas muscle and with cortical erosion along the anterior left iliac wing, significantly increased from 5.4 x 3.2 cm on 12/05/2018 CT. Other: No pneumoperitoneum, ascites or focal fluid collection. Musculoskeletal: No aggressive appearing focal osseous lesions. Moderate lumbar spondylosis. IMPRESSION: 1. Large left pelvic  sidewall 9.9 x 4.8 cm mass with heterogeneous enhancement, invading the left iliopsoas muscle and with cortical erosion along the anterior left iliac wing, significantly increased since 12/05/2018 CT, compatible with progressive metastatic disease. 2. No additional sites of metastatic disease in the abdomen or pelvis. 3. Aortic Atherosclerosis (ICD10-I70.0). Electronically Signed   By: Ilona Sorrel M.D.   On: 09/05/2019 12:29   ONCOLOGY HISTORY: Patient underwent surgery on February 26, 2016. Adjuvant treatment was recommended at that time, but patient refused on multiple occations and missed multiple follow-up appointments in the interim.  After agreeing to adjuvant chemotherapy, patient finally completed 6 cycles of carboplatinum and Taxol on April 21, 2017.  PET scan results from May 26, 2017 reviewed independently with interval decrease in size of left adnexal lesion.  There was residual hypermetabolic activity possibly indicating residual disease therefore patient was given a referral to radiation oncology for local control.  She completed a second round of XRT to this area completing on March 08, 2018.  She was subsequently initiated on maintenance letrozole which has now been discontinued given her progressive disease.  ASSESSMENT: Stage IIIa high-grade serous endometrial carcinoma.  PLAN:    1. Stage IIIa high-grade serous endometrial carcinoma: CT scan results from September 05, 2019 reviewed independently and reported as above with increasing size of known pelvic sidewall mass likely indicating progression of disease.  Patient also has increased in pain.  Patient last received chemotherapy in January 2019, and has agreed to reinitiate treatment with dose reduced carboplatinum and Taxol every 3 weeks.  Patient will also require on prior Neulasta support.  Will get PET scan to complete the staging work-up prior to initiating treatment.  Return to clinic on September 21, 2019 to initiate cycle 1 of 4 of  carboplatinum and Taxol.  Plan to reimage with PET scan at the conclusion of cycle 4.  2. Pain: Although increased, patient wishes to continue on her current narcotic regimen.  Appreciate palliative care input.  Continue oxycodone as needed. 3. Hypokalemia: Mild.  Continue oral potassium supplementation as prescribed. 4.  Lymphedema: Chronic and relatively unchanged.  5.  DVT: Diagnosed on October 12, 2017.  Continue Eliquis as prescribed.  Given patient's altered anatomy from her malignancy as well as scarring from XRT, will continue on extended Eliquis.   6.  Anemia: Resolved.   7.  Osteoporosis: Patient had a DEXA scan on May 12, 2018 which revealed a T score of -3.9.  Patient has declined any further bone mineral densities.  She also declined treatment with Fosamax, but agreed to continue with calcium and vitamin D.  I spent a total of 30 minutes reviewing chart data, face-to-face evaluation with the patient, counseling and coordination of care as detailed above.   Patient expressed understanding and was in agreement with this plan. She also understands that She can call clinic at any time with any questions, concerns, or complaints.    [name], MD   09/08/2019 6:55 AM

## 2019-09-05 ENCOUNTER — Ambulatory Visit
Admission: RE | Admit: 2019-09-05 | Discharge: 2019-09-05 | Disposition: A | Discharge status: Home / Self Care | Payer: Medicare Other | Source: Ambulatory Visit | Attending: Oncology | Admitting: Oncology

## 2019-09-05 ENCOUNTER — Other Ambulatory Visit: Payer: Self-pay

## 2019-09-05 ENCOUNTER — Inpatient Hospital Stay: Payer: Medicare Other | Attending: Oncology

## 2019-09-05 DIAGNOSIS — C541 Malignant neoplasm of endometrium: Secondary | ICD-10-CM | POA: Insufficient documentation

## 2019-09-05 DIAGNOSIS — M810 Age-related osteoporosis without current pathological fracture: Secondary | ICD-10-CM | POA: Diagnosis not present

## 2019-09-05 DIAGNOSIS — E876 Hypokalemia: Secondary | ICD-10-CM | POA: Diagnosis not present

## 2019-09-05 DIAGNOSIS — I890 Lymphedema, not elsewhere classified: Secondary | ICD-10-CM | POA: Diagnosis not present

## 2019-09-05 DIAGNOSIS — I82409 Acute embolism and thrombosis of unspecified deep veins of unspecified lower extremity: Secondary | ICD-10-CM | POA: Insufficient documentation

## 2019-09-05 DIAGNOSIS — Z87891 Personal history of nicotine dependence: Secondary | ICD-10-CM | POA: Diagnosis not present

## 2019-09-05 DIAGNOSIS — Z7901 Long term (current) use of anticoagulants: Secondary | ICD-10-CM | POA: Diagnosis not present

## 2019-09-05 DIAGNOSIS — R52 Pain, unspecified: Secondary | ICD-10-CM | POA: Diagnosis not present

## 2019-09-05 LAB — CBC WITH DIFFERENTIAL/PLATELET
Abs Immature Granulocytes: 0.02 10*3/uL (ref 0.00–0.07)
Basophils Absolute: 0 10*3/uL (ref 0.0–0.1)
Basophils Relative: 1 %
Eosinophils Absolute: 0.1 10*3/uL (ref 0.0–0.5)
Eosinophils Relative: 1 %
HCT: 37.5 % (ref 36.0–46.0)
Hemoglobin: 12.1 g/dL (ref 12.0–15.0)
Immature Granulocytes: 0 %
Lymphocytes Relative: 23 %
Lymphs Abs: 1.2 10*3/uL (ref 0.7–4.0)
MCH: 28.9 pg (ref 26.0–34.0)
MCHC: 32.3 g/dL (ref 30.0–36.0)
MCV: 89.7 fL (ref 80.0–100.0)
Monocytes Absolute: 0.4 10*3/uL (ref 0.1–1.0)
Monocytes Relative: 8 %
Neutro Abs: 3.5 10*3/uL (ref 1.7–7.7)
Neutrophils Relative %: 67 %
Platelets: 244 10*3/uL (ref 150–400)
RBC: 4.18 MIL/uL (ref 3.87–5.11)
RDW: 13.4 % (ref 11.5–15.5)
WBC: 5.2 10*3/uL (ref 4.0–10.5)
nRBC: 0 % (ref 0.0–0.2)

## 2019-09-05 LAB — COMPREHENSIVE METABOLIC PANEL
ALT: 8 U/L (ref 0–44)
AST: 19 U/L (ref 15–41)
Albumin: 3.9 g/dL (ref 3.5–5.0)
Alkaline Phosphatase: 131 U/L — ABNORMAL HIGH (ref 38–126)
Anion gap: 11 (ref 5–15)
BUN: 11 mg/dL (ref 8–23)
CO2: 27 mmol/L (ref 22–32)
Calcium: 8.7 mg/dL — ABNORMAL LOW (ref 8.9–10.3)
Chloride: 99 mmol/L (ref 98–111)
Creatinine, Ser: 0.74 mg/dL (ref 0.44–1.00)
GFR calc Af Amer: >60 mL/min (ref >60)
GFR calc non Af Amer: >60 mL/min (ref >60)
Glucose, Bld: 116 mg/dL — ABNORMAL HIGH (ref 70–99)
Potassium: 3.4 mmol/L — ABNORMAL LOW (ref 3.5–5.1)
Sodium: 137 mmol/L (ref 135–145)
Total Bilirubin: 0.8 mg/dL (ref 0.3–1.2)
Total Protein: 7.8 g/dL (ref 6.5–8.1)

## 2019-09-05 MED ORDER — IOHEXOL 300 MG/ML  SOLN
75.0000 mL | Freq: Once | INTRAMUSCULAR | Status: AC | PRN
  Administered 2019-09-05: 75 mL via INTRAVENOUS

## 2019-09-06 ENCOUNTER — Encounter: Payer: Self-pay | Admitting: Oncology

## 2019-09-06 NOTE — Progress Notes (Signed)
Patient called for pre assessment. Denies any pain or concerns at this time.  °

## 2019-09-07 ENCOUNTER — Other Ambulatory Visit: Payer: Self-pay

## 2019-09-07 ENCOUNTER — Inpatient Hospital Stay (HOSPITAL_BASED_OUTPATIENT_CLINIC_OR_DEPARTMENT_OTHER): Payer: Medicare Other | Admitting: Oncology

## 2019-09-07 ENCOUNTER — Encounter: Payer: Self-pay | Admitting: Oncology

## 2019-09-07 ENCOUNTER — Telehealth: Payer: Self-pay | Admitting: Emergency Medicine

## 2019-09-07 ENCOUNTER — Inpatient Hospital Stay (HOSPITAL_BASED_OUTPATIENT_CLINIC_OR_DEPARTMENT_OTHER): Payer: Medicare Other | Admitting: Hospice and Palliative Medicine

## 2019-09-07 VITALS — BP 146/72 | HR 64 | Temp 98.4°F | Resp 20 | Wt 131.2 lb

## 2019-09-07 DIAGNOSIS — C541 Malignant neoplasm of endometrium: Secondary | ICD-10-CM

## 2019-09-07 DIAGNOSIS — Z515 Encounter for palliative care: Secondary | ICD-10-CM

## 2019-09-07 NOTE — Telephone Encounter (Signed)
Pt PET scan and treatment start scheduled and she has been notified.

## 2019-09-07 NOTE — Progress Notes (Signed)
[facility]  [phone] [phone]   Name: Vickie Barton Date: 09/07/2019 MRN: [id number]  DOB: 26-Nov-1930  Patient Care Team: [name], MD as PCP - General (Family Medicine) Rockey Situ, Kathlene November, MD as PCP - Cardiology (Cardiology) [name], NP as Nurse Practitioner (Hospice and Palliative Medicine) Abid Bolla, [name], NP as Nurse Practitioner (Hospice and Palliative Medicine) [name], MD as Consulting Physician (Oncology)    REASON FOR CONSULTATION: Palliative Care consult requested for this 86 y.o. 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  PMH also notable for history of PE/DVT on anticoagulation with Eliquis and status post IVC filter, left hydronephrosis and hydroureter status post ureteral stent placement. CT of abdomen/pelvis on 09/05/2019 revealed significant progression of her pelvic mass. Palliative care was consulted to help address goals to manage ongoing symptoms.  SOCIAL HISTORY:     reports that she has quit smoking. She has never used smokeless tobacco. She reports that she does not drink alcohol and does not use drugs.   Patient is widowed.  She lives with her daughter.  Patient had a son who died in 2018/05/06 from cancer.  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  She later worked at an [employer].  ADVANCE DIRECTIVES:  Does not have  CODE STATUS: DNR  PAST MEDICAL HISTORY: Past Medical History:  Diagnosis Date  . Arthritis   . CHF (congestive heart failure) ([place])   . Constipation   . DVT (deep venous thrombosis) ([place]) 2017/05/06   left leg  . Endometrial cancer ([place]) 01/28/2016   Hysterectomy, chemo + rad tx's, also internal  brachytherapy on 02/2018.   [name] Hyperlipidemia   . Hypertension   . Hypothyroidism     PAST SURGICAL HISTORY:  Past Surgical History:  Procedure Laterality Date  . CYSTOSCOPY W/ RETROGRADES Left 01/01/2017   Procedure: CYSTOSCOPY WITH RETROGRADE PYELOGRAM;  Surgeon: Nickie Retort, MD;  Location: [facility];  Service: Urology;  Laterality: Left;  . CYSTOSCOPY W/ RETROGRADES Left 04/23/2017   Procedure: CYSTOSCOPY WITH RETROGRADE PYELOGRAM;  Surgeon: Abbie Sons, MD;  Location: [facility];  Service: Urology;  Laterality: Left;  . CYSTOSCOPY W/ URETERAL STENT PLACEMENT Left 04/23/2017   Procedure: CYSTOSCOPY WITH STENT REMOVAL;  Surgeon: Abbie Sons, MD;  Location: [facility];  Service: Urology;  Laterality: Left;  . CYSTOSCOPY WITH STENT PLACEMENT Left 01/01/2017   Procedure: CYSTOSCOPY WITH STENT PLACEMENT;  Surgeon: Nickie Retort, MD;  Location: [facility];  Service: Urology;  Laterality: Left;  . DILATION AND CURETTAGE OF UTERUS    . HYSTEROSCOPY WITH D & C N/A 01/28/2016   Procedure: DILATATION AND CURETTAGE /HYSTEROSCOPY;  Surgeon: [name], MD;  Location: [facility];  Service: Gynecology;  Laterality: N/A;  . LAPAROSCOPIC BILATERAL SALPINGO OOPHERECTOMY Bilateral 02/26/2016   Procedure: LAPAROSCOPIC BILATERAL SALPINGO OOPHORECTOMY;  Surgeon: [name], MD;  Location: [facility];  Service: Gynecology;  Laterality: Bilateral;  . LAPAROSCOPIC HYSTERECTOMY N/A 02/26/2016   Procedure: HYSTERECTOMY TOTAL LAPAROSCOPIC;  Surgeon: [name], MD;  Location: [facility];  Service: Gynecology;  Laterality: N/A;  . SENTINEL NODE BIOPSY N/A 02/26/2016   Procedure: SENTINEL NODE BIOPSY;  Surgeon: [name], MD;  Location: [facility];  Service: Gynecology;  Laterality: N/A;  . TEE WITHOUT CARDIOVERSION  N/A 06/01/2018   Procedure: TRANSESOPHAGEAL ECHOCARDIOGRAM (TEE);  Surgeon: [name], MD;  Location: [facility];  Service: Cardiovascular;  Laterality: N/A;  . TONSILLECTOMY       HEMATOLOGY/ONCOLOGY HISTORY:  Oncology History   No history exists.    ALLERGIES:  is allergic to sulfa antibiotics and zinc.  MEDICATIONS:  Current Outpatient Medications  Medication Sig Dispense Refill  . atorvastatin (LIPITOR) 10 MG tablet Take 1 tablet (10 mg total) by mouth daily at 6 PM. 30 tablet 0  . ELIQUIS 5 MG TABS tablet TAKE 2 TABLETS BY MOUTH TWICE A DAY 120 tablet 5  . furosemide (LASIX) 20 MG tablet Take 20 mg by mouth 2 (two) times daily.    [name] letrozole (FEMARA) 2.5 MG tablet Take 1 tablet (2.5 mg total) by mouth daily. 90 tablet 1  . levothyroxine (SYNTHROID, LEVOTHROID) 150 MCG tablet TAKE 150 MCG BY MOUTH DAILY BEFORE BREAKFAST ON EMPTY STOMACH WITH A GLASS OF WATER 30-60MINS BEFORE BREAKFAST  2  . metoprolol tartrate (LOPRESSOR) 25 MG tablet Take 0.5 tablets (12.5 mg total) by mouth 2 (two) times daily. 60 tablet 0  . ondansetron (ZOFRAN) 8 MG tablet Take 1 tablet (8 mg total) by mouth every 8 (eight) hours as needed for nausea or vomiting. 20 tablet 0  . Oxycodone HCl 10 MG TABS Take 1 tablet (10 mg total) by mouth every 4 (four) hours as needed (for moderate to severe pain). 90 tablet 0   No current facility-administered medications for this visit.    VITAL SIGNS: There were no vitals taken for this visit. There were no vitals filed for this visit.  Estimated body mass index is 24 kg/m as calculated from the following:   Height as of 06/01/18: 5\' 2"  (1.575 m).   Weight as of an earlier encounter on 09/07/19: 131 lb 3.2 oz (59.5 kg).  LABS: CBC:    Component Value Date/Time   WBC 5.2 09/05/2019 0913   HGB 12.1 09/05/2019 0913   HCT 37.5 09/05/2019 0913   PLT 244 09/05/2019 0913   MCV 89.7 09/05/2019 0913   NEUTROABS 3.5 09/05/2019 0913   LYMPHSABS 1.2 09/05/2019 0913   MONOABS 0.4 09/05/2019 0913   EOSABS 0.1 09/05/2019 0913   BASOSABS 0.0 09/05/2019 0913   Comprehensive Metabolic Panel:    Component Value Date/Time   NA 137 09/05/2019 0913    K 3.4 (L) 09/05/2019 0913   CL 99 09/05/2019 0913   CO2 27 09/05/2019 0913   BUN 11 09/05/2019 0913   CREATININE 0.74 09/05/2019 0913   GLUCOSE 116 (H) 09/05/2019 0913   CALCIUM 8.7 (L) 09/05/2019 0913   AST 19 09/05/2019 0913   ALT 8 09/05/2019 0913   ALKPHOS 131 (H) 09/05/2019 0913   BILITOT 0.8 09/05/2019 0913   PROT 7.8 09/05/2019 0913   ALBUMIN 3.9 09/05/2019 0913    RADIOGRAPHIC STUDIES: CT Abdomen Pelvis W Contrast  Result Date: 09/05/2019 CLINICAL DATA:  Endometrial cancer status post TAHBSO 02/26/2016, chemotherapy and radiation therapy. Ongoing chemotherapy. Restaging. EXAM: CT ABDOMEN AND PELVIS WITH CONTRAST TECHNIQUE: Multidetector CT imaging of the abdomen and pelvis was performed using the standard protocol following bolus administration of intravenous contrast. CONTRAST:  68mL OMNIPAQUE IOHEXOL 300 MG/ML  SOLN COMPARISON:  12/05/2018 CT abdomen/pelvis. FINDINGS: Lower chest: Stable small parenchymal bands at the dependent lung bases bilaterally. No acute abnormality at the lung bases. Hepatobiliary: Normal liver size. No liver masses. Stable small venovenous shunt in the lateral segment left liver lobe.  Phrygian cap in the fundal gallbladder with possible layering tiny gallstone. Otherwise normal gallbladder. No biliary ductal dilatation. CBD diameter 6 mm. Pancreas: Normal, with no mass or duct dilation. Spleen: Normal size spleen. Subcentimeter hypodense medial splenic lesion is too small to characterize and unchanged, considered benign. No new splenic lesions. Adrenals/Urinary Tract: Normal adrenals. No hydronephrosis. Hypodense 1.6 cm posterior interpolar right renal cortical lesion is stable in size, presumably a benign cyst. Separate simple 2.1 cm medial interpolar right renal cyst. Normal nondistended bladder. Stomach/Bowel: Normal non-distended stomach. Normal caliber small bowel with no small bowel wall thickening. Normal appendix. Moderate diffuse colonic diverticulosis  with no large bowel wall thickening or significant pericolonic fat stranding. Vascular/Lymphatic: Atherosclerotic nonaneurysmal abdominal aorta. IVC filter in stable position below the level of the renal veins. Patent portal, splenic, hepatic and renal veins. No pathologically enlarged lymph nodes in the abdomen or pelvis. Reproductive: Status post hysterectomy, with no abnormal findings at the vaginal cuff. No right adnexal mass. Large left pelvic sidewall 9.9 x 4.8 cm mass with heterogeneous enhancement (series 2/image 54) invading the left iliopsoas muscle and with cortical erosion along the anterior left iliac wing, significantly increased from 5.4 x 3.2 cm on 12/05/2018 CT. Other: No pneumoperitoneum, ascites or focal fluid collection. Musculoskeletal: No aggressive appearing focal osseous lesions. Moderate lumbar spondylosis. IMPRESSION: 1. Large left pelvic sidewall 9.9 x 4.8 cm mass with heterogeneous enhancement, invading the left iliopsoas muscle and with cortical erosion along the anterior left iliac wing, significantly increased since 12/05/2018 CT, compatible with progressive metastatic disease. 2. No additional sites of metastatic disease in the abdomen or pelvis. 3. Aortic Atherosclerosis (ICD10-I70.0). Electronically Signed   By: Ilona Sorrel M.D.   On: 09/05/2019 12:29    PERFORMANCE STATUS (ECOG) : 2  REVIEW OF SYSTEMS: As noted above. Otherwise, a complete review of systems is negative.  PHYSICAL EXAM: General: NAD, frail appearing, thin Pulmonary: Unlabored Extremities: Bilateral lower extremity edema Skin: no rashes Neurological: Weakness but otherwise nonfocal  IMPRESSION: Routine follow-up visit today in the clinic.    CT of abdomen/pelvis reveals significant progression of her pelvic mass. She saw Dr. Grayland Ormond today and is agreed to resume chemotherapy but wants to first speak with her family about this prior to fully committing. Patient says that she'll call the clinic  after the conversation to schedule PET scan/follow-up.  Symptomatically, patient says she is doing reasonably well. She denies any significant pain or other distressing symptoms at present. She does continue to take oxycodone intermittently and says it helps when she has pain.  PLAN: -Continue current scope of treatment -Continue oxycodone 10 mg every 4 hours as needed for pain -Patient is followed by ambulatory palliative care in the home -Follow-up visit to be determined  Case and plan discussed with Dr. Grayland Ormond   Patient expressed understanding and was in agreement with this plan. She also understands that She can call clinic at any time with any questions, concerns, or complaints.     Time Total: 15 minutes  Visit consisted of counseling and education dealing with the complex and emotionally intense issues of symptom management and palliative care in the setting of serious and potentially life-threatening illness.Greater than 50%  of this time was spent counseling and coordinating care related to the above assessment and plan.  Signed by: Altha Harm, PhD, NP-C [phone] (Work Cell)

## 2019-09-07 NOTE — Telephone Encounter (Signed)
Called patient to get clarification on message that she left about PET scan. Pt reported that she talked with her family and wants to go ahead with PET scan and treatment. States that any day next week is fine for PET, only week that she can start treatment is the first week of July because her family will be out of town. Questions answered regarding PET scan and basic treatment questions. Told patient that scheduler would get PET scan scheduled and call her. Pt verbalized understanding and didn't have any further questions or concerns.

## 2019-09-14 ENCOUNTER — Encounter
Admission: RE | Admit: 2019-09-14 | Discharge: 2019-09-14 | Disposition: A | Discharge status: Home / Self Care | Payer: Medicare Other | Source: Ambulatory Visit | Attending: Oncology | Admitting: Oncology

## 2019-09-14 ENCOUNTER — Other Ambulatory Visit: Payer: Self-pay

## 2019-09-14 DIAGNOSIS — I2510 Atherosclerotic heart disease of native coronary artery without angina pectoris: Secondary | ICD-10-CM | POA: Insufficient documentation

## 2019-09-14 DIAGNOSIS — C541 Malignant neoplasm of endometrium: Secondary | ICD-10-CM | POA: Diagnosis present

## 2019-09-14 DIAGNOSIS — N200 Calculus of kidney: Secondary | ICD-10-CM | POA: Insufficient documentation

## 2019-09-14 DIAGNOSIS — I700 Atherosclerosis of aorta: Secondary | ICD-10-CM | POA: Insufficient documentation

## 2019-09-14 LAB — GLUCOSE, CAPILLARY: Glucose-Capillary: 104 mg/dL — ABNORMAL HIGH (ref 70–99)

## 2019-09-14 MED ORDER — FLUDEOXYGLUCOSE F - 18 (FDG) INJECTION
6.8000 | Freq: Once | INTRAVENOUS | Status: AC | PRN
  Administered 2019-09-14: 6.99 via INTRAVENOUS

## 2019-09-14 NOTE — Progress Notes (Signed)
[place]  [phone] [phone]  ID: Vickie Barton OB: [birth year]  MR#: [id number]  NTZ#:[id number]  Patient Care Team: [name], MD as PCP - General (Family Medicine) [name], MD as PCP - Cardiology (Cardiology) [name], NP as Nurse Practitioner (Hospice and Palliative Medicine) Borders, [name], NP as Nurse Practitioner (Hospice and Palliative Medicine) [name], MD as Consulting Physician (Oncology)   CHIEF COMPLAINT: Stage IIIa high-grade serous endometrial carcinoma.  INTERVAL HISTORY: Patient returns to clinic today for further evaluation and consideration of cycle 1 of carboplatinum and Avastin.  She continues to have left hip pain, but is moderately controlled with her current dose of narcotics. Her chronic leg edema is unchanged. She does not complain of weakness or fatigue.  She has no neurologic complaints. She denies any recent fevers or illnesses.  She denies any chest pain, shortness of breath, cough, or hemoptysis.  She denies any nausea, vomiting, constipation, or diarrhea.  She has no urinary complaints.  Patient offers no further specific complaints today.  REVIEW OF SYSTEMS:   Review of Systems  Constitutional: Negative.  Negative for fever, malaise/fatigue and weight loss.  Respiratory: Negative.  Negative for cough and shortness of breath.   Cardiovascular: Positive for leg swelling. Negative for chest pain.  Gastrointestinal: Negative.  Negative for abdominal pain, constipation, nausea and vomiting.  Genitourinary: Negative.  Negative for dysuria.  Musculoskeletal: Positive for joint pain.  Skin: Negative.  Negative for rash.  Neurological: Negative.  Negative for sensory change, focal weakness, weakness and headaches.  Psychiatric/Behavioral: Negative.  The patient is not nervous/anxious.     As per HPI. Otherwise, a complete review of systems is negative.  PAST MEDICAL  HISTORY: Past Medical History:  Diagnosis Date  . Arthritis   . CHF (congestive heart failure) ([place])   . Constipation   . DVT (deep venous thrombosis) ([place]) 03/2017   left leg  . Endometrial cancer ([place]) 01/28/2016   Hysterectomy, chemo + rad tx's, also internal brachytherapy on 02/2018.   [name] Hyperlipidemia   . Hypertension   . Hypothyroidism     PAST SURGICAL HISTORY: Past Surgical History:  Procedure Laterality Date  . CYSTOSCOPY W/ RETROGRADES Left 01/01/2017   Procedure: CYSTOSCOPY WITH RETROGRADE PYELOGRAM;  Surgeon: Nickie Retort, MD;  Location: [facility];  Service: Urology;  Laterality: Left;  . CYSTOSCOPY W/ RETROGRADES Left 04/23/2017   Procedure: CYSTOSCOPY WITH RETROGRADE PYELOGRAM;  Surgeon: Abbie Sons, MD;  Location: [facility];  Service: Urology;  Laterality: Left;  . CYSTOSCOPY W/ URETERAL STENT PLACEMENT Left 04/23/2017   Procedure: CYSTOSCOPY WITH STENT REMOVAL;  Surgeon: Abbie Sons, MD;  Location: [facility];  Service: Urology;  Laterality: Left;  . CYSTOSCOPY WITH STENT PLACEMENT Left 01/01/2017   Procedure: CYSTOSCOPY WITH STENT PLACEMENT;  Surgeon: Nickie Retort, MD;  Location: [facility];  Service: Urology;  Laterality: Left;  . DILATION AND CURETTAGE OF UTERUS    . HYSTEROSCOPY WITH D & C N/A 01/28/2016   Procedure: DILATATION AND CURETTAGE /HYSTEROSCOPY;  Surgeon: [name], MD;  Location: [facility];  Service: Gynecology;  Laterality: N/A;  . LAPAROSCOPIC BILATERAL SALPINGO OOPHERECTOMY Bilateral 02/26/2016   Procedure: LAPAROSCOPIC BILATERAL SALPINGO OOPHORECTOMY;  Surgeon: [name], MD;  Location: [facility];  Service: Gynecology;  Laterality: Bilateral;  . LAPAROSCOPIC HYSTERECTOMY N/A 02/26/2016   Procedure: HYSTERECTOMY TOTAL LAPAROSCOPIC;  Surgeon: [name], MD;  Location: [facility];  Service: Gynecology;  Laterality: N/A;  .  SENTINEL NODE BIOPSY N/A 02/26/2016   Procedure: SENTINEL NODE BIOPSY;  Surgeon: [name], MD;   Location: [facility];  Service: Gynecology;  Laterality: N/A;  . TEE WITHOUT CARDIOVERSION N/A 06/01/2018   Procedure: TRANSESOPHAGEAL ECHOCARDIOGRAM (TEE);  Surgeon: [name], MD;  Location: [facility];  Service: Cardiovascular;  Laterality: N/A;  . TONSILLECTOMY      FAMILY HISTORY: Family History  Problem Relation Age of Onset  . Diabetes Father   . Heart disease Father   . Hypertension Father   . Stroke Father   . Diabetes Paternal Grandmother   . Heart disease Mother   . Hypertension Mother   . Stroke Mother   . Hypertension Sister   . Thyroid disease Sister   . Hypertension Brother     ADVANCED DIRECTIVES (Y/N):  N  HEALTH MAINTENANCE: Social History   Tobacco Use  . Smoking status: Former Research scientist (life sciences)  . Smokeless tobacco: Never Used  Vaping Use  . Vaping Use: Never used  Substance Use Topics  . Alcohol use: No  . Drug use: No     Colonoscopy:  PAP:  Bone density:  Lipid panel:  Allergies  Allergen Reactions  . Sulfa Antibiotics Rash  . Zinc Rash    Current Outpatient Medications  Medication Sig Dispense Refill  . atorvastatin (LIPITOR) 10 MG tablet Take 1 tablet (10 mg total) by mouth daily at 6 PM. 30 tablet 0  . ELIQUIS 5 MG TABS tablet TAKE 2 TABLETS BY MOUTH TWICE A DAY 120 tablet 5  . furosemide (LASIX) 20 MG tablet Take 20 mg by mouth 2 (two) times daily.    [name] levothyroxine (SYNTHROID, LEVOTHROID) 150 MCG tablet TAKE 150 MCG BY MOUTH DAILY BEFORE BREAKFAST ON EMPTY STOMACH WITH A GLASS OF WATER 30-60MINS BEFORE BREAKFAST  2  . metoprolol tartrate (LOPRESSOR) 25 MG tablet Take 0.5 tablets (12.5 mg total) by mouth 2 (two) times daily. 60 tablet 0  . ondansetron (ZOFRAN) 8 MG tablet Take 1 tablet (8 mg total) by mouth every 8 (eight) hours as needed for nausea or vomiting. 20 tablet 0  . Oxycodone HCl 10 MG TABS Take 1 tablet (10 mg total) by mouth every 4 (four) hours as needed (for moderate to severe pain). 90 tablet 0   No current  facility-administered medications for this visit.     OBJECTIVE: Vitals:   09/21/19 0840  BP: 136/64  Pulse: 66  Temp: 98.8 F (37.1 C)  SpO2: 98%     Body mass index is 23.21 kg/m.    ECOG FS:1 - Symptomatic but completely ambulatory  General: Well-developed, well-nourished, no acute distress. Eyes: Pink conjunctiva, anicteric sclera. HEENT: Normocephalic, moist mucous membranes. Lungs: No audible wheezing or coughing. Heart: Regular rate and rhythm. Abdomen: Soft, nontender, no obvious distention. Musculoskeletal: 2-3+ left leg edema. Neuro: Alert, answering all questions appropriately. Cranial nerves grossly intact. Skin: No rashes or petechiae noted. Psych: Normal affect.   LAB RESULTS:  Lab Results  Component Value Date   NA 138 09/21/2019   K 3.8 09/21/2019   CL 101 09/21/2019   CO2 26 09/21/2019   GLUCOSE 166 (H) 09/21/2019   BUN 12 09/21/2019   CREATININE 0.83 09/21/2019   CALCIUM 8.6 (L) 09/21/2019   PROT 7.6 09/21/2019   ALBUMIN 3.8 09/21/2019   AST 24 09/21/2019   ALT 10 09/21/2019   ALKPHOS 120 09/21/2019   BILITOT 0.7 09/21/2019   GFRNONAA >60 09/21/2019   GFRAA >60 09/21/2019  Lab Results  Component Value Date   WBC 5.1 09/21/2019   NEUTROABS 3.9 09/21/2019   HGB 11.8 (L) 09/21/2019   HCT 37.2 09/21/2019   MCV 90.3 09/21/2019   PLT 258 09/21/2019     STUDIES: CT Abdomen Pelvis W Contrast  Result Date: 09/05/2019 CLINICAL DATA:  Endometrial cancer status post TAHBSO 02/26/2016, chemotherapy and radiation therapy. Ongoing chemotherapy. Restaging. EXAM: CT ABDOMEN AND PELVIS WITH CONTRAST TECHNIQUE: Multidetector CT imaging of the abdomen and pelvis was performed using the standard protocol following bolus administration of intravenous contrast. CONTRAST:  18mL OMNIPAQUE IOHEXOL 300 MG/ML  SOLN COMPARISON:  12/05/2018 CT abdomen/pelvis. FINDINGS: Lower chest: Stable small parenchymal bands at the dependent lung bases bilaterally. No acute  abnormality at the lung bases. Hepatobiliary: Normal liver size. No liver masses. Stable small venovenous shunt in the lateral segment left liver lobe. Phrygian cap in the fundal gallbladder with possible layering tiny gallstone. Otherwise normal gallbladder. No biliary ductal dilatation. CBD diameter 6 mm. Pancreas: Normal, with no mass or duct dilation. Spleen: Normal size spleen. Subcentimeter hypodense medial splenic lesion is too small to characterize and unchanged, considered benign. No new splenic lesions. Adrenals/Urinary Tract: Normal adrenals. No hydronephrosis. Hypodense 1.6 cm posterior interpolar right renal cortical lesion is stable in size, presumably a benign cyst. Separate simple 2.1 cm medial interpolar right renal cyst. Normal nondistended bladder. Stomach/Bowel: Normal non-distended stomach. Normal caliber small bowel with no small bowel wall thickening. Normal appendix. Moderate diffuse colonic diverticulosis with no large bowel wall thickening or significant pericolonic fat stranding. Vascular/Lymphatic: Atherosclerotic nonaneurysmal abdominal aorta. IVC filter in stable position below the level of the renal veins. Patent portal, splenic, hepatic and renal veins. No pathologically enlarged lymph nodes in the abdomen or pelvis. Reproductive: Status post hysterectomy, with no abnormal findings at the vaginal cuff. No right adnexal mass. Large left pelvic sidewall 9.9 x 4.8 cm mass with heterogeneous enhancement (series 2/image 54) invading the left iliopsoas muscle and with cortical erosion along the anterior left iliac wing, significantly increased from 5.4 x 3.2 cm on 12/05/2018 CT. Other: No pneumoperitoneum, ascites or focal fluid collection. Musculoskeletal: No aggressive appearing focal osseous lesions. Moderate lumbar spondylosis. IMPRESSION: 1. Large left pelvic sidewall 9.9 x 4.8 cm mass with heterogeneous enhancement, invading the left iliopsoas muscle and with cortical erosion along  the anterior left iliac wing, significantly increased since 12/05/2018 CT, compatible with progressive metastatic disease. 2. No additional sites of metastatic disease in the abdomen or pelvis. 3. Aortic Atherosclerosis (ICD10-I70.0). Electronically Signed   By: Ilona Sorrel M.D.   On: 09/05/2019 12:29   NM PET Image Restag (PS) Skull Base To Thigh  Result Date: 09/14/2019 CLINICAL DATA:  Subsequent treatment strategy for endometrial cancer. Last chemotherapy January 2019. EXAM: NUCLEAR MEDICINE PET SKULL BASE TO THIGH TECHNIQUE: 7.0 mCi F-18 FDG was injected intravenously. Full-ring PET imaging was performed from the skull base to thigh after the radiotracer. CT data was obtained and used for attenuation correction and anatomic localization. Fasting blood glucose: 104 mg/dl COMPARISON:  Multiple exams, including CT scan 09/05/2019 and PET-CT from 02/04/2018 FINDINGS: Mediastinal blood pool activity: SUV max 2.5 Liver activity: SUV max N/A NECK: A right level II lymph node measuring 0.5 cm in short axis diameter has a maximum SUV of 3.1, formerly same. Incidental CT findings: Bilateral common carotid atherosclerotic calcification. CHEST: A flat and morphologically benign left axillary node measuring up to 0.4 cm in thickness continues to show mildly accentuated metabolic activity with maximum SUV  3.6, previously 4.8. Incidental CT findings: Coronary, aortic arch, and branch vessel atherosclerotic vascular disease. Scarring at both lung bases. Mild biapical pleuroparenchymal scarring. ABDOMEN/PELVIS: A large centrally necrotic mass involving the left iliopsoas muscle has a central necrotic or cystic component and a maximum SUV of 21.6. This mass measures proximally 11.8 by 7.6 by 5.8 cm (volume = 270 cm^3). Subtle cortical erosion of the adjacent left iliac bone is difficult to exclude, but no hypermetabolic tumor within the bone is identified. The previous hypermetabolic upper pelvic lymph nodes shown on the  prior PET-CT have resolved. Incidental CT findings: Aortoiliac atherosclerotic vascular disease. Small nonobstructive renal calculus in the right kidney lower pole. Right mid kidney photopenic cyst. IVC filter. Prominent stool throughout the colon favors constipation. Scattered diverticula of the ascending colon. SKELETON: As noted above, there is some subtle cortical surface irregularity of the left iliac bone adjacent to the iloipsoas mass. However, no obvious hypermetabolic intraosseous lesion is observed. Incidental CT findings: none IMPRESSION: 1. Large centrally necrotic mass of the left iliopsoas, maximum SUV 21.6, compatible with active malignancy. Subtle cortical surface erosion along the adjacent left iliac bone without frank bony invasion. 2. Faintly accentuated metabolic activity in a right level 2 lymph node in the neck and a left axillary lymph node, both of which appear morphologically benign. Probably incidental. 3. Other imaging findings of potential clinical significance: Aortic Atherosclerosis (ICD10-I70.0). Coronary atherosclerosis. Scarring at the lung bases. Nonobstructive right nephrolithiasis. Prominent stool throughout the colon favors constipation. Electronically Signed   By: Van Clines M.D.   On: 09/14/2019 13:56   ONCOLOGY HISTORY: Patient underwent surgery on February 26, 2016. Adjuvant treatment was recommended at that time, but patient refused on multiple occations and missed multiple follow-up appointments in the interim.  After agreeing to adjuvant chemotherapy, patient finally completed 6 cycles of carboplatinum and Taxol on April 21, 2017.  PET scan results from May 26, 2017 reviewed independently with interval decrease in size of left adnexal lesion.  There was residual hypermetabolic activity possibly indicating residual disease therefore patient was given a referral to radiation oncology for local control.  She completed a second round of XRT to this area completing  on March 08, 2018.  She was subsequently initiated on maintenance letrozole which has now been discontinued given her progressive disease.  ASSESSMENT: Stage IIIa high-grade serous endometrial carcinoma.  PLAN:    1. Stage IIIa high-grade serous endometrial carcinoma: CT scan results from September 05, 2019 reviewed independently with increasing size of known pelvic sidewall mass likely indicating progression of disease.  Patient also has increased in pain.  Subsequent PET scan on September 14, 2019 confirmed progression of disease without evidence of metastasis.  Patient last received chemotherapy in January 2019 and has agreed to reinitiate treatment with dose reduced carboplatinum and Taxol every 3 weeks.  Patient will also require Neulasta support.  Proceed with cycle 1 of carboplatin and Taxol today.  Patient will also receive OnPro Neulasta today.  Return to clinic in 1 week for laboratory work and further evaluation and then in 3 weeks for laboratory work, further evaluation, and consideration of cycle 2.   2. Pain: Although increased, patient wishes to continue on her current narcotic regimen.  Appreciate palliative care input.  Continue oxycodone as needed. 3. Hypokalemia: Resolved.  Continue oral potassium supplementation as prescribed. 4.  Lymphedema: Chronic and relatively unchanged.  5.  DVT: Diagnosed on October 12, 2017.  Continue Eliquis as prescribed.  Given patient's altered anatomy from  her malignancy as well as scarring from XRT, will continue on extended Eliquis.   6.  Anemia: Mild, monitor. 7.  Osteoporosis: Patient had a DEXA scan on May 12, 2018 which revealed a T score of -3.9.  Patient has declined any further bone mineral densities.  She also declined treatment with Fosamax, but agreed to continue with calcium and vitamin D.  Patient expressed understanding and was in agreement with this plan. She also understands that She can call clinic at any time with any questions, concerns,  or complaints.    [name], MD   09/22/2019 6:29 AM

## 2019-09-15 ENCOUNTER — Telehealth: Payer: Self-pay | Admitting: *Deleted

## 2019-09-15 ENCOUNTER — Other Ambulatory Visit: Payer: Self-pay | Admitting: *Deleted

## 2019-09-15 DIAGNOSIS — G893 Neoplasm related pain (acute) (chronic): Secondary | ICD-10-CM

## 2019-09-15 MED ORDER — OXYCODONE HCL 10 MG PO TABS: 10.0000 mg | ORAL_TABLET | ORAL | 0 refills | Status: DC | PRN

## 2019-09-15 NOTE — Telephone Encounter (Signed)
No, stop letrozole.

## 2019-09-15 NOTE — Telephone Encounter (Signed)
Patient called cancer center requesting refill of oxycodone 10 mg tablets every 4 hours as needed for pain.  As mandated by the [place] STOP Act (Strengthen Opioid Misuse Prevention), the [place] Controlled Substance Reporting System ([place]) was reviewed for this patient. Below is the past 54-months of controlled substance prescriptions as displayed by the registry. I have personally consulted with my supervising physician, Dr. Grayland Ormond, who agrees that continuation of opiate therapy is medically appropriate at this time and agrees to provide continual monitoring, including urine/blood drug screens, as indicated. Refill is appropriate on or after 09/12/2019.  NCCS[place] reviewed: Reviewed and acceptable    Faythe Casa, NP 09/15/2019 11:10 AM [phone]

## 2019-09-15 NOTE — Telephone Encounter (Signed)
Patient called asking if she needs to continue taking Letrozole since her cancer is growing and her treatment is being changed. Please advise

## 2019-09-15 NOTE — Telephone Encounter (Signed)
Call returned to patient and I left a message on her voice mail without identifying her name to stop the Letrozole

## 2019-09-18 ENCOUNTER — Other Ambulatory Visit: Payer: Medicare Other | Admitting: Nurse Practitioner

## 2019-09-18 ENCOUNTER — Encounter: Payer: Self-pay | Admitting: Nurse Practitioner

## 2019-09-18 ENCOUNTER — Other Ambulatory Visit: Payer: Self-pay

## 2019-09-18 DIAGNOSIS — Z515 Encounter for palliative care: Secondary | ICD-10-CM

## 2019-09-18 DIAGNOSIS — C541 Malignant neoplasm of endometrium: Secondary | ICD-10-CM

## 2019-09-18 NOTE — Progress Notes (Signed)
[facility] Consult Note Telephone: [phone]  Fax: [phone]  PATIENT NAME: Vickie FREIBERGER DOB: 1930/08/01 MRN: [id number]  PRIMARY CARE PROVIDER:   [name], MD  REFERRING PROVIDER:  [name], MD [address] [facility] [place],  [place] [id number]  RESPONSIBLE PARTY:   Self  Due to the COVID-19 crisis, this visit was done via telemedicine from my office and it was initiated and consent by this patient and or family.I did a phone visit in place of Telehealth as Telehealth was not available   RECOMMENDATIONS and PLAN: 1.ACP: DNR in place  2.Chronic painsecondary tocancerwill continue to monitor on pain scale, monitor efficacy vs adverse side effects;discussed to add tylenol as needed and maximize oxycodone every 4 hours  4.Palliative care encounter Palliative medicine team will continue to support patient, patient's family, and medical team. Visit consisted of counseling and education dealing with the complex and emotionally intense issues of symptom management and palliative care in the setting of serious and potentially life-threatening illness  I spent 50 minutes providing this consultation,  from 11:15am to 12:05pm. More than 50% of the time in this consultation was spent coordinating communication.   HISTORY OF PRESENT ILLNESS:  Vickie Barton is a 86 y.o. year old female with multiple medical problems including Congestive heart failure, DVT 03/2017, endometrial cancer 01/2016 with hysterectomy, chemo, radiation also internalbradytherapy on 02/2018, hypothyroidism, hypertension, hyperlipidemia, arthritis, tonsillectomy, TEE without cardioversion, laparoscopic bilateral salpingooophorectomy.I called Ms. Devaul for scheduled telemedicine telephonic is video not available follow-up palliative care visit. We talked about how she has been feeling. Ms. Bodie  endorses that she is doing okay. Ms.  Maiolo just had her appointment with oncology and follow-up diagnostic testing. The diagnostic testing noted that Letrozole has not helped with her cancer. Ms. Meditz  endorses that she will start chemotherapy infusions again on Thursday this week as CT of abdomen/pelvis reveals significant progression of her pelvic mass. We talked about side effects that she previously had experienced significant diarrhea. We talked about the importance of diet regimen and what helped before when she when she received this type of chemotherapy. We talked about nausea vomiting. We talked about nutrition. We talked about endurance. We talked about fatigue what she currently is not experiencing. We talked about her functional-level where she is currently independent. We talked about realistic expectations, hopes of improvement. We talked about quality-of-life. We talked about medical goals of care. Those are to treat what is treatable. We talked about concerns for infusions. We talked about course of treatment. We talked about role of palliative care and plan of care. Discuss that will do a follow-up visit in 2 weeks to see how she does with chemotherapy infusions, side effects or sooner should she declined. Ms. Marcus in agreement. Therapeutic listening and emotional support provided. Questions answered to satisfaction. Contact information.   Palliative Care was asked to help to continue to address goals of care.   Dr Grayland Ormond Oncology plan:  1. Stage IIIa high-grade serous endometrial carcinoma: CT scan results from September 05, 2019 reviewed independently and reported as above with increasing size of known pelvic sidewall mass likely indicating progression of disease.  Patient also has increased in pain.  Patient last received chemotherapy in January 2019, and has agreed to reinitiate treatment with dose reduced carboplatinum and Taxol every 3 weeks.  Patient will also require on prior Neulasta support.  Will get PET scan  to complete the staging work-up  prior to initiating treatment.  Return to clinic on September 21, 2019 to initiate cycle 1 of 4 of carboplatinum and Taxol.  Plan to reimage with PET scan at the conclusion of cycle 4.   CODE STATUS: DNR  PPS: 60% HOSPICE ELIGIBILITY/DIAGNOSIS: TBD  PAST MEDICAL HISTORY:  Past Medical History:  Diagnosis Date   Arthritis    CHF (congestive heart failure) ([place])    Constipation    DVT (deep venous thrombosis) ([place]) 03/2017   left leg   Endometrial cancer ([place]) 01/28/2016   Hysterectomy, chemo + rad tx's, also internal brachytherapy on 02/2018.    Hyperlipidemia    Hypertension    Hypothyroidism     SOCIAL HX:  Social History   Tobacco Use   Smoking status: Former Smoker   Smokeless tobacco: Never Used  Substance Use Topics   Alcohol use: No    ALLERGIES:  Allergies  Allergen Reactions   Sulfa Antibiotics Rash   Zinc Rash     PERTINENT MEDICATIONS:  Outpatient Encounter Medications as of 09/18/2019  Medication Sig   atorvastatin (LIPITOR) 10 MG tablet Take 1 tablet (10 mg total) by mouth daily at 6 PM.   ELIQUIS 5 MG TABS tablet TAKE 2 TABLETS BY MOUTH TWICE A DAY   furosemide (LASIX) 20 MG tablet Take 20 mg by mouth 2 (two) times daily.   letrozole (FEMARA) 2.5 MG tablet Take 1 tablet (2.5 mg total) by mouth daily.   levothyroxine (SYNTHROID, LEVOTHROID) 150 MCG tablet TAKE 150 MCG BY MOUTH DAILY BEFORE BREAKFAST ON EMPTY STOMACH WITH A GLASS OF WATER 30-60MINS BEFORE BREAKFAST   metoprolol tartrate (LOPRESSOR) 25 MG tablet Take 0.5 tablets (12.5 mg total) by mouth 2 (two) times daily.   ondansetron (ZOFRAN) 8 MG tablet Take 1 tablet (8 mg total) by mouth every 8 (eight) hours as needed for nausea or vomiting.   Oxycodone HCl 10 MG TABS Take 1 tablet (10 mg total) by mouth every 4 (four) hours as needed (for moderate to severe pain).   No facility-administered encounter medications on file as of 09/18/2019.    PHYSICAL  EXAM:   Deferred  Laquentin Loudermilk Z Nader Boys, NP

## 2019-09-20 ENCOUNTER — Encounter: Payer: Self-pay | Admitting: Oncology

## 2019-09-20 NOTE — Progress Notes (Signed)
Patient called for pre assessment. She denies any pain or concerns at this time.  

## 2019-09-21 ENCOUNTER — Inpatient Hospital Stay (HOSPITAL_BASED_OUTPATIENT_CLINIC_OR_DEPARTMENT_OTHER): Payer: Medicare Other | Admitting: Oncology

## 2019-09-21 ENCOUNTER — Other Ambulatory Visit: Payer: Self-pay

## 2019-09-21 ENCOUNTER — Inpatient Hospital Stay: Payer: Medicare Other | Attending: Oncology

## 2019-09-21 ENCOUNTER — Inpatient Hospital Stay: Payer: Medicare Other

## 2019-09-21 VITALS — BP 136/64 | HR 66 | Temp 98.8°F | Wt 126.9 lb

## 2019-09-21 VITALS — BP 120/62 | HR 61 | Resp 20

## 2019-09-21 DIAGNOSIS — Z5111 Encounter for antineoplastic chemotherapy: Secondary | ICD-10-CM | POA: Insufficient documentation

## 2019-09-21 DIAGNOSIS — Z5189 Encounter for other specified aftercare: Secondary | ICD-10-CM | POA: Diagnosis not present

## 2019-09-21 DIAGNOSIS — C541 Malignant neoplasm of endometrium: Secondary | ICD-10-CM | POA: Insufficient documentation

## 2019-09-21 LAB — CBC WITH DIFFERENTIAL/PLATELET
Abs Immature Granulocytes: 0.01 10*3/uL (ref 0.00–0.07)
Basophils Absolute: 0 10*3/uL (ref 0.0–0.1)
Basophils Relative: 0 %
Eosinophils Absolute: 0.1 10*3/uL (ref 0.0–0.5)
Eosinophils Relative: 1 %
HCT: 37.2 % (ref 36.0–46.0)
Hemoglobin: 11.8 g/dL — ABNORMAL LOW (ref 12.0–15.0)
Immature Granulocytes: 0 %
Lymphocytes Relative: 16 %
Lymphs Abs: 0.8 10*3/uL (ref 0.7–4.0)
MCH: 28.6 pg (ref 26.0–34.0)
MCHC: 31.7 g/dL (ref 30.0–36.0)
MCV: 90.3 fL (ref 80.0–100.0)
Monocytes Absolute: 0.3 10*3/uL (ref 0.1–1.0)
Monocytes Relative: 7 %
Neutro Abs: 3.9 10*3/uL (ref 1.7–7.7)
Neutrophils Relative %: 76 %
Platelets: 258 10*3/uL (ref 150–400)
RBC: 4.12 MIL/uL (ref 3.87–5.11)
RDW: 13.2 % (ref 11.5–15.5)
WBC: 5.1 10*3/uL (ref 4.0–10.5)
nRBC: 0 % (ref 0.0–0.2)

## 2019-09-21 LAB — COMPREHENSIVE METABOLIC PANEL
ALT: 10 U/L (ref 0–44)
AST: 24 U/L (ref 15–41)
Albumin: 3.8 g/dL (ref 3.5–5.0)
Alkaline Phosphatase: 120 U/L (ref 38–126)
Anion gap: 11 (ref 5–15)
BUN: 12 mg/dL (ref 8–23)
CO2: 26 mmol/L (ref 22–32)
Calcium: 8.6 mg/dL — ABNORMAL LOW (ref 8.9–10.3)
Chloride: 101 mmol/L (ref 98–111)
Creatinine, Ser: 0.83 mg/dL (ref 0.44–1.00)
GFR calc Af Amer: >60 mL/min (ref >60)
GFR calc non Af Amer: >60 mL/min (ref >60)
Glucose, Bld: 166 mg/dL — ABNORMAL HIGH (ref 70–99)
Potassium: 3.8 mmol/L (ref 3.5–5.1)
Sodium: 138 mmol/L (ref 135–145)
Total Bilirubin: 0.7 mg/dL (ref 0.3–1.2)
Total Protein: 7.6 g/dL (ref 6.5–8.1)

## 2019-09-21 MED ORDER — PEGFILGRASTIM 6 MG/0.6ML ~~LOC~~ PSKT
6.0000 mg | PREFILLED_SYRINGE | Freq: Once | SUBCUTANEOUS | Status: AC
  Administered 2019-09-21: 6 mg via SUBCUTANEOUS
  Filled 2019-09-21: qty 0.6

## 2019-09-21 MED ORDER — PALONOSETRON HCL INJECTION 0.25 MG/5ML
0.2500 mg | Freq: Once | INTRAVENOUS | Status: AC
  Administered 2019-09-21: 0.25 mg via INTRAVENOUS
  Filled 2019-09-21: qty 5

## 2019-09-21 MED ORDER — DIPHENHYDRAMINE HCL 50 MG/ML IJ SOLN
25.0000 mg | Freq: Once | INTRAMUSCULAR | Status: AC
  Administered 2019-09-21: 25 mg via INTRAVENOUS
  Filled 2019-09-21: qty 1

## 2019-09-21 MED ORDER — SODIUM CHLORIDE 0.9 % IV SOLN
234.0000 mg | Freq: Once | INTRAVENOUS | Status: AC
  Administered 2019-09-21: 234 mg via INTRAVENOUS
  Filled 2019-09-21: qty 39

## 2019-09-21 MED ORDER — HEPARIN SOD (PORK) LOCK FLUSH 100 UNIT/ML IV SOLN
INTRAVENOUS | Status: AC
  Filled 2019-09-21: qty 5

## 2019-09-21 MED ORDER — SODIUM CHLORIDE 0.9 % IV SOLN
250.0000 mg | Freq: Once | INTRAVENOUS | Status: AC
  Administered 2019-09-21: 250 mg via INTRAVENOUS
  Filled 2019-09-21: qty 25

## 2019-09-21 MED ORDER — SODIUM CHLORIDE 0.9 % IV SOLN
Freq: Once | INTRAVENOUS | Status: AC
  Filled 2019-09-21: qty 250

## 2019-09-21 MED ORDER — FAMOTIDINE IN NACL 20-0.9 MG/50ML-% IV SOLN
20.0000 mg | Freq: Once | INTRAVENOUS | Status: AC
  Administered 2019-09-21: 20 mg via INTRAVENOUS
  Filled 2019-09-21: qty 50

## 2019-09-21 MED ORDER — SODIUM CHLORIDE 0.9 % IV SOLN
10.0000 mg | Freq: Once | INTRAVENOUS | Status: AC
  Administered 2019-09-21: 10 mg via INTRAVENOUS
  Filled 2019-09-21: qty 10

## 2019-09-24 NOTE — Progress Notes (Signed)
[place]  [phone] [phone]  ID: Vickie Barton OB: Mar 23, 1931  MR#: [id number]  RCV#:[id number]  Patient Care Team: [name], MD as PCP - General (Family Medicine) Rockey Situ Kathlene November, MD as PCP - Cardiology (Cardiology) [name], NP as Nurse Practitioner (Hospice and Palliative Medicine) Borders, [name], NP as Nurse Practitioner (Hospice and Palliative Medicine) [name], MD as Consulting Physician (Oncology)   CHIEF COMPLAINT: Stage IIIa high-grade serous endometrial carcinoma.  INTERVAL HISTORY: Patient returns to clinic today for further evaluation and to assess her toleration of cycle 1 of carboplatinum and Taxol.  She continues to have left hip pain, but this is well controlled with her current narcotic regimen.  Her chronic leg edema is unchanged. She does not complain of weakness or fatigue.  She has no neurologic complaints. She denies any recent fevers or illnesses.  She denies any chest pain, shortness of breath, cough, or hemoptysis.  She denies any nausea, vomiting, constipation, or diarrhea.  She has no urinary complaints.  Patient offers no further specific complaints today.  REVIEW OF SYSTEMS:   Review of Systems  Constitutional: Negative.  Negative for fever, malaise/fatigue and weight loss.  Respiratory: Negative.  Negative for cough and shortness of breath.   Cardiovascular: Positive for leg swelling. Negative for chest pain.  Gastrointestinal: Negative.  Negative for abdominal pain, constipation, nausea and vomiting.  Genitourinary: Negative.  Negative for dysuria.  Musculoskeletal: Positive for joint pain.  Skin: Negative.  Negative for rash.  Neurological: Negative.  Negative for sensory change, focal weakness, weakness and headaches.  Psychiatric/Behavioral: Negative.  The patient is not nervous/anxious.     As per HPI. Otherwise, a complete review of systems is negative.  PAST  MEDICAL HISTORY: Past Medical History:  Diagnosis Date   Arthritis    CHF (congestive heart failure) ([place])    Constipation    DVT (deep venous thrombosis) ([place]) 03/2017   left leg   Endometrial cancer ([place]) 01/28/2016   Hysterectomy, chemo + rad tx's, also internal brachytherapy on 02/2018.    Hyperlipidemia    Hypertension    Hypothyroidism     PAST SURGICAL HISTORY: Past Surgical History:  Procedure Laterality Date   CYSTOSCOPY W/ RETROGRADES Left 01/01/2017   Procedure: CYSTOSCOPY WITH RETROGRADE PYELOGRAM;  Surgeon: Nickie Retort, MD;  Location: [facility];  Service: Urology;  Laterality: Left;   CYSTOSCOPY W/ RETROGRADES Left 04/23/2017   Procedure: CYSTOSCOPY WITH RETROGRADE PYELOGRAM;  Surgeon: Abbie Sons, MD;  Location: [facility];  Service: Urology;  Laterality: Left;   CYSTOSCOPY W/ URETERAL STENT PLACEMENT Left 04/23/2017   Procedure: CYSTOSCOPY WITH STENT REMOVAL;  Surgeon: Abbie Sons, MD;  Location: [facility];  Service: Urology;  Laterality: Left;   CYSTOSCOPY WITH STENT PLACEMENT Left 01/01/2017   Procedure: CYSTOSCOPY WITH STENT PLACEMENT;  Surgeon: Nickie Retort, MD;  Location: [facility];  Service: Urology;  Laterality: Left;   DILATION AND CURETTAGE OF UTERUS     HYSTEROSCOPY WITH D & C N/A 01/28/2016   Procedure: DILATATION AND CURETTAGE /HYSTEROSCOPY;  Surgeon: [name], MD;  Location: [facility];  Service: Gynecology;  Laterality: N/A;   LAPAROSCOPIC BILATERAL SALPINGO OOPHERECTOMY Bilateral 02/26/2016   Procedure: LAPAROSCOPIC BILATERAL SALPINGO OOPHORECTOMY;  Surgeon: [name], MD;  Location: [facility];  Service: Gynecology;  Laterality: Bilateral;   LAPAROSCOPIC HYSTERECTOMY N/A 02/26/2016   Procedure: HYSTERECTOMY TOTAL LAPAROSCOPIC;  Surgeon: [name], MD;  Location: [facility];  Service:  Gynecology;  Laterality: N/A;   SENTINEL NODE BIOPSY N/A 02/26/2016   Procedure: SENTINEL NODE BIOPSY;  Surgeon: [name], MD;   Location: [facility];  Service: Gynecology;  Laterality: N/A;   TEE WITHOUT CARDIOVERSION N/A 06/01/2018   Procedure: TRANSESOPHAGEAL ECHOCARDIOGRAM (TEE);  Surgeon: [name], MD;  Location: [facility];  Service: Cardiovascular;  Laterality: N/A;   TONSILLECTOMY      FAMILY HISTORY: Family History  Problem Relation Age of Onset   Diabetes Father    Heart disease Father    Hypertension Father    Stroke Father    Diabetes Paternal Grandmother    Heart disease Mother    Hypertension Mother    Stroke Mother    Hypertension Sister    Thyroid disease Sister    Hypertension Brother     ADVANCED DIRECTIVES (Y/N):  N  HEALTH MAINTENANCE: Social History   Tobacco Use   Smoking status: Former Smoker   Smokeless tobacco: Never Used  Scientific laboratory technician Use: Never used  Substance Use Topics   Alcohol use: No   Drug use: No     Colonoscopy:  PAP:  Bone density:  Lipid panel:  Allergies  Allergen Reactions   Sulfa Antibiotics Rash   Zinc Rash    Current Outpatient Medications  Medication Sig Dispense Refill   atorvastatin (LIPITOR) 10 MG tablet Take 1 tablet (10 mg total) by mouth daily at 6 PM. 30 tablet 0   ELIQUIS 5 MG TABS tablet TAKE 2 TABLETS BY MOUTH TWICE A DAY 120 tablet 5   furosemide (LASIX) 20 MG tablet Take 20 mg by mouth 2 (two) times daily.     levothyroxine (SYNTHROID, LEVOTHROID) 150 MCG tablet TAKE 150 MCG BY MOUTH DAILY BEFORE BREAKFAST ON EMPTY STOMACH WITH A GLASS OF WATER 30-60MINS BEFORE BREAKFAST  2   metoprolol tartrate (LOPRESSOR) 25 MG tablet Take 0.5 tablets (12.5 mg total) by mouth 2 (two) times daily. 60 tablet 0   ondansetron (ZOFRAN) 8 MG tablet Take 1 tablet (8 mg total) by mouth every 8 (eight) hours as needed for nausea or vomiting. 20 tablet 0   Oxycodone HCl 10 MG TABS Take 1 tablet (10 mg total) by mouth every 4 (four) hours as needed (for moderate to severe pain). 90 tablet 0   No current  facility-administered medications for this visit.     OBJECTIVE: Vitals:   09/27/19 1400  BP: (!) 142/82  Pulse: 76  Resp: 19  Temp: 98.7 F (37.1 C)  SpO2: 97%     Body mass index is 22.97 kg/m.    ECOG FS:1 - Symptomatic but completely ambulatory  General: Well-developed, well-nourished, no acute distress. Eyes: Pink conjunctiva, anicteric sclera. HEENT: Normocephalic, moist mucous membranes. Lungs: No audible wheezing or coughing. Heart: Regular rate and rhythm. Abdomen: Soft, nontender, no obvious distention. Musculoskeletal: 2-3+ left lower extremity edema. Neuro: Alert, answering all questions appropriately. Cranial nerves grossly intact. Skin: No rashes or petechiae noted. Psych: Normal affect.    LAB RESULTS:  Lab Results  Component Value Date   NA 137 09/28/2019   K 4.1 09/28/2019   CL 99 09/28/2019   CO2 26 09/28/2019   GLUCOSE 155 (H) 09/28/2019   BUN 14 09/28/2019   CREATININE 0.74 09/28/2019   CALCIUM 8.4 (L) 09/28/2019   PROT 7.4 09/28/2019   ALBUMIN 3.8 09/28/2019   AST 21 09/28/2019   ALT 11 09/28/2019   ALKPHOS 111 09/28/2019   BILITOT 0.7 09/28/2019  GFRNONAA >60 09/28/2019   GFRAA >60 09/28/2019    Lab Results  Component Value Date   WBC 3.8 (L) 09/28/2019   NEUTROABS 3.0 09/28/2019   HGB 11.4 (L) 09/28/2019   HCT 34.3 (L) 09/28/2019   MCV 87.9 09/28/2019   PLT 206 09/28/2019     STUDIES: CT Abdomen Pelvis W Contrast  Result Date: 09/05/2019 CLINICAL DATA:  Endometrial cancer status post TAHBSO 02/26/2016, chemotherapy and radiation therapy. Ongoing chemotherapy. Restaging. EXAM: CT ABDOMEN AND PELVIS WITH CONTRAST TECHNIQUE: Multidetector CT imaging of the abdomen and pelvis was performed using the standard protocol following bolus administration of intravenous contrast. CONTRAST:  44mL OMNIPAQUE IOHEXOL 300 MG/ML  SOLN COMPARISON:  12/05/2018 CT abdomen/pelvis. FINDINGS: Lower chest: Stable small parenchymal bands at the dependent  lung bases bilaterally. No acute abnormality at the lung bases. Hepatobiliary: Normal liver size. No liver masses. Stable small venovenous shunt in the lateral segment left liver lobe. Phrygian cap in the fundal gallbladder with possible layering tiny gallstone. Otherwise normal gallbladder. No biliary ductal dilatation. CBD diameter 6 mm. Pancreas: Normal, with no mass or duct dilation. Spleen: Normal size spleen. Subcentimeter hypodense medial splenic lesion is too small to characterize and unchanged, considered benign. No new splenic lesions. Adrenals/Urinary Tract: Normal adrenals. No hydronephrosis. Hypodense 1.6 cm posterior interpolar right renal cortical lesion is stable in size, presumably a benign cyst. Separate simple 2.1 cm medial interpolar right renal cyst. Normal nondistended bladder. Stomach/Bowel: Normal non-distended stomach. Normal caliber small bowel with no small bowel wall thickening. Normal appendix. Moderate diffuse colonic diverticulosis with no large bowel wall thickening or significant pericolonic fat stranding. Vascular/Lymphatic: Atherosclerotic nonaneurysmal abdominal aorta. IVC filter in stable position below the level of the renal veins. Patent portal, splenic, hepatic and renal veins. No pathologically enlarged lymph nodes in the abdomen or pelvis. Reproductive: Status post hysterectomy, with no abnormal findings at the vaginal cuff. No right adnexal mass. Large left pelvic sidewall 9.9 x 4.8 cm mass with heterogeneous enhancement (series 2/image 54) invading the left iliopsoas muscle and with cortical erosion along the anterior left iliac wing, significantly increased from 5.4 x 3.2 cm on 12/05/2018 CT. Other: No pneumoperitoneum, ascites or focal fluid collection. Musculoskeletal: No aggressive appearing focal osseous lesions. Moderate lumbar spondylosis. IMPRESSION: 1. Large left pelvic sidewall 9.9 x 4.8 cm mass with heterogeneous enhancement, invading the left iliopsoas muscle  and with cortical erosion along the anterior left iliac wing, significantly increased since 12/05/2018 CT, compatible with progressive metastatic disease. 2. No additional sites of metastatic disease in the abdomen or pelvis. 3. Aortic Atherosclerosis (ICD10-I70.0). Electronically Signed   By: Ilona Sorrel M.D.   On: 09/05/2019 12:29   NM PET Image Restag (PS) Skull Base To Thigh  Result Date: 09/14/2019 CLINICAL DATA:  Subsequent treatment strategy for endometrial cancer. Last chemotherapy January 2019. EXAM: NUCLEAR MEDICINE PET SKULL BASE TO THIGH TECHNIQUE: 7.0 mCi F-18 FDG was injected intravenously. Full-ring PET imaging was performed from the skull base to thigh after the radiotracer. CT data was obtained and used for attenuation correction and anatomic localization. Fasting blood glucose: 104 mg/dl COMPARISON:  Multiple exams, including CT scan 09/05/2019 and PET-CT from 02/04/2018 FINDINGS: Mediastinal blood pool activity: SUV max 2.5 Liver activity: SUV max N/A NECK: A right level II lymph node measuring 0.5 cm in short axis diameter has a maximum SUV of 3.1, formerly same. Incidental CT findings: Bilateral common carotid atherosclerotic calcification. CHEST: A flat and morphologically benign left axillary node measuring up to 0.4  cm in thickness continues to show mildly accentuated metabolic activity with maximum SUV 3.6, previously 4.8. Incidental CT findings: Coronary, aortic arch, and branch vessel atherosclerotic vascular disease. Scarring at both lung bases. Mild biapical pleuroparenchymal scarring. ABDOMEN/PELVIS: A large centrally necrotic mass involving the left iliopsoas muscle has a central necrotic or cystic component and a maximum SUV of 21.6. This mass measures proximally 11.8 by 7.6 by 5.8 cm (volume = 270 cm^3). Subtle cortical erosion of the adjacent left iliac bone is difficult to exclude, but no hypermetabolic tumor within the bone is identified. The previous hypermetabolic upper  pelvic lymph nodes shown on the prior PET-CT have resolved. Incidental CT findings: Aortoiliac atherosclerotic vascular disease. Small nonobstructive renal calculus in the right kidney lower pole. Right mid kidney photopenic cyst. IVC filter. Prominent stool throughout the colon favors constipation. Scattered diverticula of the ascending colon. SKELETON: As noted above, there is some subtle cortical surface irregularity of the left iliac bone adjacent to the iloipsoas mass. However, no obvious hypermetabolic intraosseous lesion is observed. Incidental CT findings: none IMPRESSION: 1. Large centrally necrotic mass of the left iliopsoas, maximum SUV 21.6, compatible with active malignancy. Subtle cortical surface erosion along the adjacent left iliac bone without frank bony invasion. 2. Faintly accentuated metabolic activity in a right level 2 lymph node in the neck and a left axillary lymph node, both of which appear morphologically benign. Probably incidental. 3. Other imaging findings of potential clinical significance: Aortic Atherosclerosis (ICD10-I70.0). Coronary atherosclerosis. Scarring at the lung bases. Nonobstructive right nephrolithiasis. Prominent stool throughout the colon favors constipation. Electronically Signed   By: Van Clines M.D.   On: 09/14/2019 13:56   ONCOLOGY HISTORY: Patient underwent surgery on February 26, 2016. Adjuvant treatment was recommended at that time, but patient refused on multiple occations and missed multiple follow-up appointments in the interim.  After agreeing to adjuvant chemotherapy, patient finally completed 6 cycles of carboplatinum and Taxol on April 21, 2017.  PET scan results from May 26, 2017 reviewed independently with interval decrease in size of left adnexal lesion.  There was residual hypermetabolic activity possibly indicating residual disease therefore patient was given a referral to radiation oncology for local control.  She completed a second round  of XRT to this area completing on March 08, 2018.  She was subsequently initiated on maintenance letrozole which has now been discontinued given her progressive disease.  ASSESSMENT: Stage IIIa high-grade serous endometrial carcinoma.  PLAN:    1. Stage IIIa high-grade serous endometrial carcinoma: CT scan results from September 05, 2019 reviewed independently with increasing size of known pelvic sidewall mass likely indicating progression of disease.  Patient also has increased in pain.  Subsequent PET scan on September 14, 2019 confirmed progression of disease without evidence of distant metastasis.  Patient last received chemotherapy in January 2019 and has agreed to reinitiate treatment with dose reduced carboplatin and Taxol every 3 weeks.  Patient will also require Neulasta support.  Patient tolerated cycle 1 last week without significant side effects.  Return to clinic in 2 weeks for further evaluation and consideration of cycle 2. 2. Pain: Improved.  Patient wishes to continue on her current narcotic regimen.  Appreciate palliative care input.  Continue oxycodone as needed. 3. Hypokalemia: Resolved.  Continue oral potassium supplementation as prescribed. 4.  Lymphedema: Chronic and relatively unchanged.  5.  DVT: Diagnosed on October 12, 2017.  Continue Eliquis as prescribed.  Given patient's altered anatomy from her malignancy as well as scarring from XRT,  will continue on extended Eliquis.   6.  Anemia: Mild, monitor.  Patient's hemoglobin is 11.4 today. 7.  Osteoporosis: Patient had a DEXA scan on May 12, 2018 which revealed a T score of -3.9.  Patient has declined any further bone mineral densities.  She also declined treatment with Fosamax, but agreed to continue with calcium and vitamin D.  Patient expressed understanding and was in agreement with this plan. She also understands that She can call clinic at any time with any questions, concerns, or complaints.    [name], MD    09/29/2019 6:48 AM

## 2019-09-27 ENCOUNTER — Encounter: Payer: Self-pay | Admitting: Nurse Practitioner

## 2019-09-27 ENCOUNTER — Encounter: Payer: Self-pay | Admitting: Oncology

## 2019-09-27 ENCOUNTER — Other Ambulatory Visit: Payer: Self-pay

## 2019-09-27 ENCOUNTER — Other Ambulatory Visit: Payer: Medicare Other | Admitting: Nurse Practitioner

## 2019-09-27 DIAGNOSIS — C541 Malignant neoplasm of endometrium: Secondary | ICD-10-CM

## 2019-09-27 DIAGNOSIS — Z515 Encounter for palliative care: Secondary | ICD-10-CM

## 2019-09-27 NOTE — Progress Notes (Signed)
Vickie Barton Consult Note Telephone: [phone]  Fax: [phone]  PATIENT NAME: Vickie Barton DOB: 1930/06/14 MRN: [id number]  PRIMARY CARE PROVIDER:   [name], MD  REFERRING PROVIDER:  [name], MD [address] [facility] [place],   [address]  RESPONSIBLE PARTY:   Self  Due to the COVID-19 crisis, this visit was done via telemedicine from my office and it was initiated and consent by this patient and or family.I did a phone visit in place of Telehealth as Telehealth was not available   RECOMMENDATIONS and PLAN: 1.ACP: DNR in place  2.Chronic painsecondary tocancerwill continue to monitor on pain scale, monitor efficacy vs adverse side effects;discussed to add tylenol as needed and maximize oxycodone every 4 hours  4.Palliative care encounter Palliative medicine team will continue to support patient, patient's family, and medical team. Visit consisted of counseling and education dealing with the complex and emotionally intense issues of symptom management and palliative care in the setting of serious and potentially life-threatening illness  I spent 50 minutes providing this consultation,  from 11:00am to 11:50am. More than 50% of the time in this consultation was spent coordinating communication.   HISTORY OF PRESENT ILLNESS:  Vickie Barton is a 86 y.o. year old female with multiple medical problems including Stage IIIa high-grade serous endometrial carcinoma, Congestive heart failure, DVT 03/2017, endometrial cancer 01/2016 with hysterectomy, chemo, radiation also internalbradytherapy on 02/2018, hypothyroidism, hypertension, hyperlipidemia, arthritis, tonsillectomy, TEE without cardioversion, laparoscopic bilateral salpingooophorectomy. I called Vickie Barton for telemedicine telephonic is video not available follow up palliative care visit. Vickie Barton talked about purpose of palliative  care visit. Vickie Barton in agreement. We talked about how she was feeling since she has received her infusion. Vickie Barton verbalize that she feels much better. Limited side effects with constipation though that improved. We talked about symptoms of pain in her hip which have resolved with infusion. Vickie Barton endorses the last time she received infusions it did resolve her paying for 3 years. Vickie Barton endorse is that she does have knee pain on occasion for what she continues to take the oxycodone as needed with relief for arthritis. Vickie Barton endorses her appetite has been good. No episodes of nausea or vomiting. Her next infusion is in two weeks. Vickie Barton endorses her hopes are that she is able to complete her full treatment of infusions without or limited side effects. We talked about medical goals of care. Vickie Barton endorses she has a follow-up appointment with oncologist Vickie Barton tomorrow. Vickie Barton has no concerns or complaints at this time. Discuss follow up in 4 weeks with either in person or telemedicine palliative care visit. Vickie Barton endorses she prefers telemedicine. Vickie Barton in agreement, appointment scheduled. Therapeutic listening and emotional support provided. Contact information. Questions answered to satisfaction. Palliative Care was asked to help to continue to address goals of care.   Oncology plan:  ASSESSMENT: Stage IIIa high-grade serous endometrial carcinoma.  PLAN:   1. Stage IIIa high-grade serous endometrial carcinoma: CT scan results from September 05, 2019 reviewed independently with increasing size of known pelvic sidewall mass likely indicating progression of disease.  Patient also has increased in pain.  Subsequent PET scan on September 14, 2019 confirmed progression of disease without evidence of metastasis.  Patient last received chemotherapy in January 2019 and has agreed to reinitiate treatment with dose reduced carboplatinum and Taxol every 3 weeks.   Patient will also require  Neulasta support.  Proceed with cycle 1 of carboplatin and Taxol today.  Patient will also receive OnPro Neulasta today.  Return to clinic in 1 week for laboratory work and further evaluation and then in 3 weeks for laboratory work, further evaluation, and consideration of cycle 2.   2. Pain: Although increased, patient wishes to continue on her current narcotic regimen.  Appreciate palliative care input.  Continue oxycodone as needed. 3. Hypokalemia: Resolved.  Continue oral potassium supplementation as prescribed. 4.  Lymphedema: Chronic and relatively unchanged.  5.  DVT: Diagnosed on October 12, 2017.  Continue Eliquis as prescribed.  Given patient's altered anatomy from her malignancy as well as scarring from XRT, will continue on extended Eliquis.   6.  Anemia: Mild, monitor. 7.  Osteoporosis: Patient had a DEXA scan on May 12, 2018 which revealed a T score of -3.9.  Patient has declined any further bone mineral densities.  She also declined treatment with Fosamax, but agreed to continue with calcium and vitamin D.  CODE STATUS: DNR  PPS: 60% HOSPICE ELIGIBILITY/DIAGNOSIS: TBD  PAST MEDICAL HISTORY:  Past Medical History:  Diagnosis Date  . Arthritis   . CHF (congestive heart failure) ([place])   . Constipation   . DVT (deep venous thrombosis) ([place]) 03/2017   left leg  . Endometrial cancer ([place]) 01/28/2016   Hysterectomy, chemo + rad tx's, also internal brachytherapy on 02/2018.   [name] Hyperlipidemia   . Hypertension   . Hypothyroidism     SOCIAL HX:  Social History   Tobacco Use  . Smoking status: Former Research scientist (life sciences)  . Smokeless tobacco: Never Used  Substance Use Topics  . Alcohol use: No    ALLERGIES:  Allergies  Allergen Reactions  . Sulfa Antibiotics Rash  . Zinc Rash     PERTINENT MEDICATIONS:  Outpatient Encounter Medications as of 84/09/2019  Medication Sig  . atorvastatin (LIPITOR) 10 MG tablet Take 1 tablet (10 mg total) by mouth daily at 6  PM.  . ELIQUIS 5 MG TABS tablet TAKE 2 TABLETS BY MOUTH TWICE A DAY  . furosemide (LASIX) 20 MG tablet Take 20 mg by mouth 2 (two) times daily.  [name] levothyroxine (SYNTHROID, LEVOTHROID) 150 MCG tablet TAKE 150 MCG BY MOUTH DAILY BEFORE BREAKFAST ON EMPTY STOMACH WITH A GLASS OF WATER 30-60MINS BEFORE BREAKFAST  . metoprolol tartrate (LOPRESSOR) 25 MG tablet Take 0.5 tablets (12.5 mg total) by mouth 2 (two) times daily.  . ondansetron (ZOFRAN) 8 MG tablet Take 1 tablet (8 mg total) by mouth every 8 (eight) hours as needed for nausea or vomiting.  . Oxycodone HCl 10 MG TABS Take 1 tablet (10 mg total) by mouth every 4 (four) hours as needed (for moderate to severe pain).   No facility-administered encounter medications on file as of 09/27/2019.    PHYSICAL EXAM:   Deferred  Nuria Phebus Z Merion Grimaldo, NP

## 2019-09-27 NOTE — Progress Notes (Signed)
Patient was called for pre assessment. She denies any pain or concerns at this time.  

## 2019-09-28 ENCOUNTER — Inpatient Hospital Stay: Payer: Medicare Other

## 2019-09-28 ENCOUNTER — Other Ambulatory Visit: Payer: Self-pay

## 2019-09-28 ENCOUNTER — Inpatient Hospital Stay (HOSPITAL_BASED_OUTPATIENT_CLINIC_OR_DEPARTMENT_OTHER): Payer: Medicare Other | Admitting: Oncology

## 2019-09-28 VITALS — BP 142/82 | HR 76 | Temp 98.7°F | Resp 19 | Wt 125.6 lb

## 2019-09-28 DIAGNOSIS — C541 Malignant neoplasm of endometrium: Secondary | ICD-10-CM

## 2019-09-28 DIAGNOSIS — G893 Neoplasm related pain (acute) (chronic): Secondary | ICD-10-CM

## 2019-09-28 DIAGNOSIS — Z5111 Encounter for antineoplastic chemotherapy: Secondary | ICD-10-CM | POA: Diagnosis not present

## 2019-09-28 DIAGNOSIS — Z5189 Encounter for other specified aftercare: Secondary | ICD-10-CM | POA: Diagnosis not present

## 2019-09-28 LAB — COMPREHENSIVE METABOLIC PANEL
ALT: 11 U/L (ref 0–44)
AST: 21 U/L (ref 15–41)
Albumin: 3.8 g/dL (ref 3.5–5.0)
Alkaline Phosphatase: 111 U/L (ref 38–126)
Anion gap: 12 (ref 5–15)
BUN: 14 mg/dL (ref 8–23)
CO2: 26 mmol/L (ref 22–32)
Calcium: 8.4 mg/dL — ABNORMAL LOW (ref 8.9–10.3)
Chloride: 99 mmol/L (ref 98–111)
Creatinine, Ser: 0.74 mg/dL (ref 0.44–1.00)
GFR calc Af Amer: >60 mL/min (ref >60)
GFR calc non Af Amer: >60 mL/min (ref >60)
Glucose, Bld: 155 mg/dL — ABNORMAL HIGH (ref 70–99)
Potassium: 4.1 mmol/L (ref 3.5–5.1)
Sodium: 137 mmol/L (ref 135–145)
Total Bilirubin: 0.7 mg/dL (ref 0.3–1.2)
Total Protein: 7.4 g/dL (ref 6.5–8.1)

## 2019-09-28 LAB — CBC WITH DIFFERENTIAL/PLATELET
Abs Immature Granulocytes: 0.04 10*3/uL (ref 0.00–0.07)
Basophils Absolute: 0 10*3/uL (ref 0.0–0.1)
Basophils Relative: 1 %
Eosinophils Absolute: 0 10*3/uL (ref 0.0–0.5)
Eosinophils Relative: 1 %
HCT: 34.3 % — ABNORMAL LOW (ref 36.0–46.0)
Hemoglobin: 11.4 g/dL — ABNORMAL LOW (ref 12.0–15.0)
Immature Granulocytes: 1 %
Lymphocytes Relative: 15 %
Lymphs Abs: 0.6 10*3/uL — ABNORMAL LOW (ref 0.7–4.0)
MCH: 29.2 pg (ref 26.0–34.0)
MCHC: 33.2 g/dL (ref 30.0–36.0)
MCV: 87.9 fL (ref 80.0–100.0)
Monocytes Absolute: 0.1 10*3/uL (ref 0.1–1.0)
Monocytes Relative: 3 %
Neutro Abs: 3 10*3/uL (ref 1.7–7.7)
Neutrophils Relative %: 79 %
Platelets: 206 10*3/uL (ref 150–400)
RBC: 3.9 MIL/uL (ref 3.87–5.11)
RDW: 13.1 % (ref 11.5–15.5)
WBC: 3.8 10*3/uL — ABNORMAL LOW (ref 4.0–10.5)
nRBC: 0 % (ref 0.0–0.2)

## 2019-09-28 MED ORDER — OXYCODONE HCL 10 MG PO TABS: 10.0000 mg | ORAL_TABLET | ORAL | 0 refills | Status: DC | PRN

## 2019-10-12 ENCOUNTER — Other Ambulatory Visit: Payer: Self-pay

## 2019-10-12 ENCOUNTER — Inpatient Hospital Stay (HOSPITAL_BASED_OUTPATIENT_CLINIC_OR_DEPARTMENT_OTHER): Payer: Medicare Other | Admitting: Nurse Practitioner

## 2019-10-12 ENCOUNTER — Inpatient Hospital Stay: Payer: Medicare Other

## 2019-10-12 ENCOUNTER — Encounter: Payer: Self-pay | Admitting: Nurse Practitioner

## 2019-10-12 VITALS — BP 115/75 | HR 64 | Temp 97.9°F | Wt 128.2 lb

## 2019-10-12 DIAGNOSIS — C541 Malignant neoplasm of endometrium: Secondary | ICD-10-CM | POA: Diagnosis not present

## 2019-10-12 DIAGNOSIS — Z5189 Encounter for other specified aftercare: Secondary | ICD-10-CM | POA: Diagnosis not present

## 2019-10-12 DIAGNOSIS — Z5111 Encounter for antineoplastic chemotherapy: Secondary | ICD-10-CM | POA: Diagnosis not present

## 2019-10-12 LAB — COMPREHENSIVE METABOLIC PANEL
ALT: 9 U/L (ref 0–44)
AST: 19 U/L (ref 15–41)
Albumin: 3.7 g/dL (ref 3.5–5.0)
Alkaline Phosphatase: 121 U/L (ref 38–126)
Anion gap: 11 (ref 5–15)
BUN: 11 mg/dL (ref 8–23)
CO2: 27 mmol/L (ref 22–32)
Calcium: 8.9 mg/dL (ref 8.9–10.3)
Chloride: 103 mmol/L (ref 98–111)
Creatinine, Ser: 0.74 mg/dL (ref 0.44–1.00)
GFR calc Af Amer: >60 mL/min (ref >60)
GFR calc non Af Amer: >60 mL/min (ref >60)
Glucose, Bld: 124 mg/dL — ABNORMAL HIGH (ref 70–99)
Potassium: 3.9 mmol/L (ref 3.5–5.1)
Sodium: 141 mmol/L (ref 135–145)
Total Bilirubin: 0.7 mg/dL (ref 0.3–1.2)
Total Protein: 7.3 g/dL (ref 6.5–8.1)

## 2019-10-12 LAB — CBC WITH DIFFERENTIAL/PLATELET
Abs Immature Granulocytes: 0.01 10*3/uL (ref 0.00–0.07)
Basophils Absolute: 0 10*3/uL (ref 0.0–0.1)
Basophils Relative: 0 %
Eosinophils Absolute: 0 10*3/uL (ref 0.0–0.5)
Eosinophils Relative: 1 %
HCT: 33.9 % — ABNORMAL LOW (ref 36.0–46.0)
Hemoglobin: 11 g/dL — ABNORMAL LOW (ref 12.0–15.0)
Immature Granulocytes: 0 %
Lymphocytes Relative: 20 %
Lymphs Abs: 0.9 10*3/uL (ref 0.7–4.0)
MCH: 29.3 pg (ref 26.0–34.0)
MCHC: 32.4 g/dL (ref 30.0–36.0)
MCV: 90.4 fL (ref 80.0–100.0)
Monocytes Absolute: 0.5 10*3/uL (ref 0.1–1.0)
Monocytes Relative: 11 %
Neutro Abs: 3.1 10*3/uL (ref 1.7–7.7)
Neutrophils Relative %: 68 %
Platelets: 221 10*3/uL (ref 150–400)
RBC: 3.75 MIL/uL — ABNORMAL LOW (ref 3.87–5.11)
RDW: 14.6 % (ref 11.5–15.5)
WBC: 4.6 10*3/uL (ref 4.0–10.5)
nRBC: 0 % (ref 0.0–0.2)

## 2019-10-12 MED ORDER — SODIUM CHLORIDE 0.9 % IV SOLN
250.0000 mg | Freq: Once | INTRAVENOUS | Status: AC
  Administered 2019-10-12: 250 mg via INTRAVENOUS
  Filled 2019-10-12: qty 25

## 2019-10-12 MED ORDER — DIPHENHYDRAMINE HCL 50 MG/ML IJ SOLN
25.0000 mg | Freq: Once | INTRAMUSCULAR | Status: AC
  Administered 2019-10-12: 25 mg via INTRAVENOUS
  Filled 2019-10-12: qty 1

## 2019-10-12 MED ORDER — SODIUM CHLORIDE 0.9 % IV SOLN
10.0000 mg | Freq: Once | INTRAVENOUS | Status: AC
  Administered 2019-10-12: 10 mg via INTRAVENOUS
  Filled 2019-10-12: qty 1

## 2019-10-12 MED ORDER — FAMOTIDINE IN NACL 20-0.9 MG/50ML-% IV SOLN
20.0000 mg | Freq: Once | INTRAVENOUS | Status: AC
  Administered 2019-10-12: 20 mg via INTRAVENOUS
  Filled 2019-10-12: qty 50

## 2019-10-12 MED ORDER — SODIUM CHLORIDE 0.9 % IV SOLN
234.0000 mg | Freq: Once | INTRAVENOUS | Status: AC
  Administered 2019-10-12: 234 mg via INTRAVENOUS
  Filled 2019-10-12: qty 39

## 2019-10-12 MED ORDER — SODIUM CHLORIDE 0.9 % IV SOLN
Freq: Once | INTRAVENOUS | Status: AC
  Filled 2019-10-12: qty 250

## 2019-10-12 MED ORDER — PALONOSETRON HCL INJECTION 0.25 MG/5ML
0.2500 mg | Freq: Once | INTRAVENOUS | Status: AC
  Administered 2019-10-12: 0.25 mg via INTRAVENOUS
  Filled 2019-10-12: qty 5

## 2019-10-12 MED ORDER — PEGFILGRASTIM 6 MG/0.6ML ~~LOC~~ PSKT
6.0000 mg | PREFILLED_SYRINGE | Freq: Once | SUBCUTANEOUS | Status: AC
  Administered 2019-10-12: 6 mg via SUBCUTANEOUS
  Filled 2019-10-12: qty 0.6

## 2019-10-12 NOTE — Progress Notes (Signed)
Patient denies any concerns today.  

## 2019-10-12 NOTE — Progress Notes (Signed)
[place]  [phone] [phone]  ID: Vickie Barton OB: 1931-01-28  MR#: [id number]  QIO#:[id number]  Patient Care Team: [name], MD as PCP - General (Family Medicine) [name], MD as PCP - Cardiology (Cardiology) [name], NP as Nurse Practitioner (Hospice and Palliative Medicine) Borders, [name], NP as Nurse Practitioner (Hospice and Palliative Medicine) [name], MD as Consulting Physician (Oncology)  CHIEF COMPLAINT: Stage IIIa high-grade serous endometrial carcinoma.  INTERVAL HISTORY: Patient returns to clinic today for further evaluation and consideration of cycle 2 of carboplatinum and Taxol with Neulasta onpro support.  She continues to be followed by outpatient palliative care for chronic hip pain secondary to her malignancy and alternates Tylenol and oxycodone. She has chronic constipation, 2 BMs per week and takes dulcolax. Left leg swelling slightly improved. She denies any recent fevers or illness.  She denies any chest pain, shortness of breath, cough, or hemoptysis.  She denies nausea, vomiting, or diarrhea.  She has no urinary complaints.  No neurologic complaints.  No weakness or fatigue.  She offers no further specific complaints today.  REVIEW OF SYSTEMS:   Review of Systems  Constitutional: Negative.  Negative for fever, malaise/fatigue and weight loss.  Respiratory: Negative.  Negative for cough and shortness of breath.   Cardiovascular: Positive for leg swelling. Negative for chest pain.  Gastrointestinal: Negative.  Negative for abdominal pain, constipation, nausea and vomiting.  Genitourinary: Negative.  Negative for dysuria.  Musculoskeletal: Positive for joint pain.  Skin: Negative.  Negative for rash.  Neurological: Negative.  Negative for sensory change, focal weakness, weakness and headaches.  Psychiatric/Behavioral: Negative.  The patient is not nervous/anxious.   As  per HPI. Otherwise, a complete review of systems is negative.  PAST MEDICAL HISTORY: Past Medical History:  Diagnosis Date  . Arthritis   . CHF (congestive heart failure) ([place])   . Constipation   . DVT (deep venous thrombosis) ([place]) 03/2017   left leg  . Endometrial cancer ([place]) 01/28/2016   Hysterectomy, chemo + rad tx's, also internal brachytherapy on 02/2018.   [name] Hyperlipidemia   . Hypertension   . Hypothyroidism     PAST SURGICAL HISTORY: Past Surgical History:  Procedure Laterality Date  . CYSTOSCOPY W/ RETROGRADES Left 01/01/2017   Procedure: CYSTOSCOPY WITH RETROGRADE PYELOGRAM;  Surgeon: Nickie Retort, MD;  Location: [facility];  Service: Urology;  Laterality: Left;  . CYSTOSCOPY W/ RETROGRADES Left 04/23/2017   Procedure: CYSTOSCOPY WITH RETROGRADE PYELOGRAM;  Surgeon: Abbie Sons, MD;  Location: [facility];  Service: Urology;  Laterality: Left;  . CYSTOSCOPY W/ URETERAL STENT PLACEMENT Left 04/23/2017   Procedure: CYSTOSCOPY WITH STENT REMOVAL;  Surgeon: Abbie Sons, MD;  Location: [facility];  Service: Urology;  Laterality: Left;  . CYSTOSCOPY WITH STENT PLACEMENT Left 01/01/2017   Procedure: CYSTOSCOPY WITH STENT PLACEMENT;  Surgeon: Nickie Retort, MD;  Location: [facility];  Service: Urology;  Laterality: Left;  . DILATION AND CURETTAGE OF UTERUS    . HYSTEROSCOPY WITH D & C N/A 01/28/2016   Procedure: DILATATION AND CURETTAGE /HYSTEROSCOPY;  Surgeon: [name], MD;  Location: [facility];  Service: Gynecology;  Laterality: N/A;  . LAPAROSCOPIC BILATERAL SALPINGO OOPHERECTOMY Bilateral 02/26/2016   Procedure: LAPAROSCOPIC BILATERAL SALPINGO OOPHORECTOMY;  Surgeon: [name], MD;  Location: [facility];  Service: Gynecology;  Laterality: Bilateral;  . LAPAROSCOPIC HYSTERECTOMY N/A 02/26/2016   Procedure: HYSTERECTOMY TOTAL LAPAROSCOPIC;  Surgeon: [name], MD;  Location: [facility];  Service: Gynecology;  Laterality: N/A;  . SENTINEL NODE BIOPSY N/A  02/26/2016   Procedure: SENTINEL NODE BIOPSY;  Surgeon: [name], MD;  Location: [facility];  Service: Gynecology;  Laterality: N/A;  . TEE WITHOUT CARDIOVERSION N/A 06/01/2018   Procedure: TRANSESOPHAGEAL ECHOCARDIOGRAM (TEE);  Surgeon: [name], MD;  Location: [facility];  Service: Cardiovascular;  Laterality: N/A;  . TONSILLECTOMY      FAMILY HISTORY: Family History  Problem Relation Age of Onset  . Diabetes Father   . Heart disease Father   . Hypertension Father   . Stroke Father   . Diabetes Paternal Grandmother   . Heart disease Mother   . Hypertension Mother   . Stroke Mother   . Hypertension Sister   . Thyroid disease Sister   . Hypertension Brother     ADVANCED DIRECTIVES (Y/N):  N  HEALTH MAINTENANCE: Social History   Tobacco Use  . Smoking status: Former Research scientist (life sciences)  . Smokeless tobacco: Never Used  Vaping Use  . Vaping Use: Never used  Substance Use Topics  . Alcohol use: No  . Drug use: No    Colonoscopy:  PAP: not indicated  Bone density: 05/12/2018 T score -3.9. Declined fosamax or further DEXA scans  Lipid panel:  Allergies  Allergen Reactions  . Sulfa Antibiotics Rash  . Zinc Rash    Current Outpatient Medications  Medication Sig Dispense Refill  . atorvastatin (LIPITOR) 10 MG tablet Take 1 tablet (10 mg total) by mouth daily at 6 PM. 30 tablet 0  . ELIQUIS 5 MG TABS tablet TAKE 2 TABLETS BY MOUTH TWICE A DAY 120 tablet 5  . furosemide (LASIX) 20 MG tablet Take 20 mg by mouth 2 (two) times daily.    [name] levothyroxine (SYNTHROID, LEVOTHROID) 150 MCG tablet TAKE 150 MCG BY MOUTH DAILY BEFORE BREAKFAST ON EMPTY STOMACH WITH A GLASS OF WATER 30-60MINS BEFORE BREAKFAST  2  . metoprolol tartrate (LOPRESSOR) 25 MG tablet Take 0.5 tablets (12.5 mg total) by mouth 2 (two) times daily. 60 tablet 0  . Oxycodone HCl 10 MG TABS Take 1 tablet (10 mg total) by mouth every 4 (four) hours as needed (for moderate to severe pain). 90 tablet 0  . ondansetron  (ZOFRAN) 8 MG tablet Take 1 tablet (8 mg total) by mouth every 8 (eight) hours as needed for nausea or vomiting. (Patient not taking: Reported on 10/12/2019) 20 tablet 0   No current facility-administered medications for this visit.   Facility-Administered Medications Ordered in Other Visits  Medication Dose Route Frequency Provider Last Rate Last Admin  . CARBOplatin (PARAPLATIN) 250 mg in sodium chloride 0.9 % 250 mL chemo infusion  250 mg Intravenous Once [name], MD      . PACLitaxel (TAXOL) 234 mg in sodium chloride 0.9 % 250 mL chemo infusion (> 18m/m2)  234 mg Intravenous Once FLloyd Huger MD 96 mL/hr at 10/12/19 1047 234 mg at 10/12/19 1047  . pegfilgrastim (NEULASTA ONPRO KIT) injection 6 mg  6 mg Subcutaneous Once FLloyd Huger MD         OBJECTIVE: Vitals:   10/12/19 0849  BP: 115/75  Pulse: 64  Temp: 97.9 F (36.6 C)     Body mass index is 23.45 kg/m.     ECOG FS:1 - Symptomatic but completely ambulatory  General: Well-developed, well-nourished, no acute distress. Eyes: Pink conjunctiva, anicteric sclera. HEENT: Normocephalic.  Moist mucous membranes. Lungs: Clear to auscultation bilaterally. Heart:  Regular rate and rhythm. No rubs, murmurs, or gallops. Abdomen: Soft, nontender, nondistended. No organomegaly noted, normoactive bowel sounds. Musculoskeletal: No cyanosis, or clubbing. 2-3+ left lower extremity edema Neuro: Alert, answering all questions appropriately. Cranial nerves grossly intact. Skin: No rashes or petechiae noted. Psych: Normal affect.  LAB RESULTS:  Lab Results  Component Value Date   NA 141 10/12/2019   K 3.9 10/12/2019   CL 103 10/12/2019   CO2 27 10/12/2019   GLUCOSE 124 (H) 10/12/2019   BUN 11 10/12/2019   CREATININE 0.74 10/12/2019   CALCIUM 8.9 10/12/2019   PROT 7.3 10/12/2019   ALBUMIN 3.7 10/12/2019   AST 19 10/12/2019   ALT 9 10/12/2019   ALKPHOS 121 10/12/2019   BILITOT 0.7 10/12/2019   GFRNONAA >60  10/12/2019   GFRAA >60 10/12/2019    Lab Results  Component Value Date   WBC 4.6 10/12/2019   NEUTROABS 3.1 10/12/2019   HGB 11.0 (L) 10/12/2019   HCT 33.9 (L) 10/12/2019   MCV 90.4 10/12/2019   PLT 221 10/12/2019    STUDIES: NM PET Image Restag (PS) Skull Base To Thigh  Result Date: 09/14/2019 CLINICAL DATA:  Subsequent treatment strategy for endometrial cancer. Last chemotherapy January 2019. EXAM: NUCLEAR MEDICINE PET SKULL BASE TO THIGH TECHNIQUE: 7.0 mCi F-18 FDG was injected intravenously. Full-ring PET imaging was performed from the skull base to thigh after the radiotracer. CT data was obtained and used for attenuation correction and anatomic localization. Fasting blood glucose: 104 mg/dl COMPARISON:  Multiple exams, including CT scan 09/05/2019 and PET-CT from 02/04/2018 FINDINGS: Mediastinal blood pool activity: SUV max 2.5 Liver activity: SUV max N/A NECK: A right level II lymph node measuring 0.5 cm in short axis diameter has a maximum SUV of 3.1, formerly same. Incidental CT findings: Bilateral common carotid atherosclerotic calcification. CHEST: A flat and morphologically benign left axillary node measuring up to 0.4 cm in thickness continues to show mildly accentuated metabolic activity with maximum SUV 3.6, previously 4.8. Incidental CT findings: Coronary, aortic arch, and branch vessel atherosclerotic vascular disease. Scarring at both lung bases. Mild biapical pleuroparenchymal scarring. ABDOMEN/PELVIS: A large centrally necrotic mass involving the left iliopsoas muscle has a central necrotic or cystic component and a maximum SUV of 21.6. This mass measures proximally 11.8 by 7.6 by 5.8 cm (volume = 270 cm^3). Subtle cortical erosion of the adjacent left iliac bone is difficult to exclude, but no hypermetabolic tumor within the bone is identified. The previous hypermetabolic upper pelvic lymph nodes shown on the prior PET-CT have resolved. Incidental CT findings: Aortoiliac  atherosclerotic vascular disease. Small nonobstructive renal calculus in the right kidney lower pole. Right mid kidney photopenic cyst. IVC filter. Prominent stool throughout the colon favors constipation. Scattered diverticula of the ascending colon. SKELETON: As noted above, there is some subtle cortical surface irregularity of the left iliac bone adjacent to the iloipsoas mass. However, no obvious hypermetabolic intraosseous lesion is observed. Incidental CT findings: none IMPRESSION: 1. Large centrally necrotic mass of the left iliopsoas, maximum SUV 21.6, compatible with active malignancy. Subtle cortical surface erosion along the adjacent left iliac bone without frank bony invasion. 2. Faintly accentuated metabolic activity in a right level 2 lymph node in the neck and a left axillary lymph node, both of which appear morphologically benign. Probably incidental. 3. Other imaging findings of potential clinical significance: Aortic Atherosclerosis (ICD10-I70.0). Coronary atherosclerosis. Scarring at the lung bases. Nonobstructive right nephrolithiasis. Prominent stool throughout the colon favors constipation. Electronically Signed  By: Van Clines M.D.   On: 09/14/2019 13:56   ONCOLOGY HISTORY: Patient underwent surgery on February 26, 2016. Adjuvant treatment was recommended at that time, but patient refused on multiple occations and missed multiple follow-up appointments in the interim.  After agreeing to adjuvant chemotherapy, patient finally completed 6 cycles of carboplatinum and Taxol on April 21, 2017.  PET scan results from May 26, 2017 reviewed independently with interval decrease in size of left adnexal lesion.  There was residual hypermetabolic activity possibly indicating residual disease therefore patient was given a referral to radiation oncology for local control.  She completed a second round of XRT to this area completing on March 08, 2018.  She was subsequently initiated on  maintenance letrozole which has now been discontinued given her progressive disease.  ASSESSMENT: Stage IIIa high-grade serous endometrial carcinoma.  PLAN:    1. Stage IIIa high-grade serous endometrial carcinoma: CT from 09/05/2019 demonstrated increased size of known pelvic sidewall mass consistent with radiographic progression of disease.  Patient having increased pain supporting clinical progression.  PET on 09/14/2019 confirmed progression of disease without evidence of distant metastasis.  Last chemotherapy in January 2019.  Platinum sensitive.  She agreed to reinitiate treatment with dose reduced carboplatin and Taxol every 3 weeks with Neulasta support for prevention of febrile neutropenia is.  Tolerated cycle 1 well without significant side effects.  Labs reviewed and acceptable for treatment today.  Proceed with cycle 2 of dose reduced carboplatin and Taxol with Neulasta OnPro support.  Return to clinic in 3 weeks for further evaluation and consideration of cycle 3. 2. Pain: Improved on current narcotic regimen with alternating Tylenol.  Followed by outpatient palliative care.  Appreciate their input. Continue oxycodone as prescribed 3. Hypokalemia: Improved. K 3.4 today. Continue oral potassium supplementation as prescribed. 4.  Lymphedema: Chronic and likely secondary to left pelvic side wall mass. Slightly improved with chemo. Continue to monitor.  5.  DVT: Diagnosed on October 12, 2017.  Continue Eliquis as prescribed.  Given patient's altered anatomy from her malignancy as well as scarring from XRT, will continue on extended Eliquis.   6.  Anemia: Mild. Patient's hemoglobin is 11.0 today.  Trending down likely secondary to chemotherapy.  Continue to monitor. 7.  Osteoporosis: Patient had a DEXA scan on May 12, 2018 which revealed a T score of -3.9.  Patient has declined any further bone mineral densities.  She also declined treatment with Fosamax, but agreed to continue with calcium 1200  mg and vitamin D 800 iu.   Patient expressed understanding and was in agreement with this plan. She also understands that She can call clinic at any time with any questions, concerns, or complaints.    Verlon Au, NP   10/12/2019 11:07 AM

## 2019-10-16 ENCOUNTER — Other Ambulatory Visit: Payer: Self-pay | Admitting: *Deleted

## 2019-10-16 DIAGNOSIS — G893 Neoplasm related pain (acute) (chronic): Secondary | ICD-10-CM

## 2019-10-17 MED ORDER — OXYCODONE HCL 10 MG PO TABS: 10.0000 mg | ORAL_TABLET | ORAL | 0 refills | Status: DC | PRN

## 2019-10-20 ENCOUNTER — Other Ambulatory Visit: Payer: Self-pay | Admitting: *Deleted

## 2019-10-20 MED ORDER — OXYCODONE HCL 5 MG PO TABS: 10.0000 mg | ORAL_TABLET | ORAL | 0 refills | Status: DC | PRN

## 2019-10-20 NOTE — Telephone Encounter (Signed)
Patient called reporting that [facility] on [address] called and told her that Oxycodone 10 mg is on back order and they do not know when it will be available and for [place] to send in prescription for the 5 mg tabs for this patient instead.

## 2019-10-27 NOTE — Progress Notes (Signed)
[place]  [phone] [phone]  ID: Vickie Barton OB: 04/12/1930  MR#: [id number]  MWU#:[id number]  Patient Care Team: [name], MD as PCP - General (Family Medicine) [name], MD as PCP - Cardiology (Cardiology) [name], NP as Nurse Practitioner (Hospice and Palliative Medicine) Borders, [name], NP as Nurse Practitioner (Hospice and Palliative Medicine) [name], MD as Consulting Physician (Oncology)   CHIEF COMPLAINT: Stage IIIa high-grade serous endometrial carcinoma.  INTERVAL HISTORY: Patient returns to clinic today for further evaluation and consideration of cycle 3 of carboplatin and Taxol.  She is tolerating her treatments well without significant side effects.  She continues to have left hip pain, but this is well controlled with her current narcotic regimen.  Her chronic leg edema is mildly improved. She does not complain of weakness or fatigue.  She has no neurologic complaints. She denies any recent fevers or illnesses.  She denies any chest pain, shortness of breath, cough, or hemoptysis.  She denies any nausea, vomiting, constipation, or diarrhea.  She has no urinary complaints.  Patient offers no further specific complaints today.  REVIEW OF SYSTEMS:   Review of Systems  Constitutional: Negative.  Negative for fever, malaise/fatigue and weight loss.  Respiratory: Negative.  Negative for cough and shortness of breath.   Cardiovascular: Positive for leg swelling. Negative for chest pain.  Gastrointestinal: Positive for constipation. Negative for abdominal pain, nausea and vomiting.  Genitourinary: Negative.  Negative for dysuria.  Musculoskeletal: Positive for joint pain.  Skin: Negative.  Negative for rash.  Neurological: Negative.  Negative for sensory change, focal weakness, weakness and headaches.  Psychiatric/Behavioral: Negative.  The patient is not nervous/anxious.     As per  HPI. Otherwise, a complete review of systems is negative.  PAST MEDICAL HISTORY: Past Medical History:  Diagnosis Date  . Arthritis   . CHF (congestive heart failure) ([place])   . Constipation   . DVT (deep venous thrombosis) ([place]) 03/2017   left leg  . Endometrial cancer ([place]) 01/28/2016   Hysterectomy, chemo + rad tx's, also internal brachytherapy on 02/2018.   [name] Hyperlipidemia   . Hypertension   . Hypothyroidism     PAST SURGICAL HISTORY: Past Surgical History:  Procedure Laterality Date  . CYSTOSCOPY W/ RETROGRADES Left 01/01/2017   Procedure: CYSTOSCOPY WITH RETROGRADE PYELOGRAM;  Surgeon: Nickie Retort, MD;  Location: [facility];  Service: Urology;  Laterality: Left;  . CYSTOSCOPY W/ RETROGRADES Left 04/23/2017   Procedure: CYSTOSCOPY WITH RETROGRADE PYELOGRAM;  Surgeon: Abbie Sons, MD;  Location: [facility];  Service: Urology;  Laterality: Left;  . CYSTOSCOPY W/ URETERAL STENT PLACEMENT Left 04/23/2017   Procedure: CYSTOSCOPY WITH STENT REMOVAL;  Surgeon: Abbie Sons, MD;  Location: [facility];  Service: Urology;  Laterality: Left;  . CYSTOSCOPY WITH STENT PLACEMENT Left 01/01/2017   Procedure: CYSTOSCOPY WITH STENT PLACEMENT;  Surgeon: Nickie Retort, MD;  Location: [facility];  Service: Urology;  Laterality: Left;  . DILATION AND CURETTAGE OF UTERUS    . HYSTEROSCOPY WITH D & C N/A 01/28/2016   Procedure: DILATATION AND CURETTAGE /HYSTEROSCOPY;  Surgeon: [name], MD;  Location: [facility];  Service: Gynecology;  Laterality: N/A;  . LAPAROSCOPIC BILATERAL SALPINGO OOPHERECTOMY Bilateral 02/26/2016   Procedure: LAPAROSCOPIC BILATERAL SALPINGO OOPHORECTOMY;  Surgeon: [name], MD;  Location: [facility];  Service: Gynecology;  Laterality: Bilateral;  . LAPAROSCOPIC HYSTERECTOMY N/A 02/26/2016   Procedure: HYSTERECTOMY TOTAL LAPAROSCOPIC;  Surgeon: Vikki Ports  C Ward, MD;  Location: [facility];  Service: Gynecology;  Laterality: N/A;  . SENTINEL NODE BIOPSY N/A 02/26/2016    Procedure: SENTINEL NODE BIOPSY;  Surgeon: [name], MD;  Location: [facility];  Service: Gynecology;  Laterality: N/A;  . TEE WITHOUT CARDIOVERSION N/A 06/01/2018   Procedure: TRANSESOPHAGEAL ECHOCARDIOGRAM (TEE);  Surgeon: [name], MD;  Location: [facility];  Service: Cardiovascular;  Laterality: N/A;  . TONSILLECTOMY      FAMILY HISTORY: Family History  Problem Relation Age of Onset  . Diabetes Father   . Heart disease Father   . Hypertension Father   . Stroke Father   . Diabetes Paternal Grandmother   . Heart disease Mother   . Hypertension Mother   . Stroke Mother   . Hypertension Sister   . Thyroid disease Sister   . Hypertension Brother     ADVANCED DIRECTIVES (Y/N):  N  HEALTH MAINTENANCE: Social History   Tobacco Use  . Smoking status: Former Research scientist (life sciences)  . Smokeless tobacco: Never Used  Vaping Use  . Vaping Use: Never used  Substance Use Topics  . Alcohol use: No  . Drug use: No     Colonoscopy:  PAP:  Bone density:  Lipid panel:  Allergies  Allergen Reactions  . Sulfa Antibiotics Rash  . Zinc Rash    Current Outpatient Medications  Medication Sig Dispense Refill  . atorvastatin (LIPITOR) 10 MG tablet Take 1 tablet (10 mg total) by mouth daily at 6 PM. 30 tablet 0  . ELIQUIS 5 MG TABS tablet TAKE 2 TABLETS BY MOUTH TWICE A DAY 120 tablet 5  . furosemide (LASIX) 20 MG tablet Take 20 mg by mouth 2 (two) times daily.    [name] levothyroxine (SYNTHROID, LEVOTHROID) 150 MCG tablet TAKE 150 MCG BY MOUTH DAILY BEFORE BREAKFAST ON EMPTY STOMACH WITH A GLASS OF WATER 30-60MINS BEFORE BREAKFAST  2  . metoprolol tartrate (LOPRESSOR) 25 MG tablet Take 0.5 tablets (12.5 mg total) by mouth 2 (two) times daily. 60 tablet 0  . ondansetron (ZOFRAN) 8 MG tablet Take 1 tablet (8 mg total) by mouth every 8 (eight) hours as needed for nausea or vomiting. 20 tablet 0  . oxyCODONE (OXY IR/ROXICODONE) 5 MG immediate release tablet Take 2 tablets (10 mg total) by mouth  every 4 (four) hours as needed for severe pain. 180 tablet 0  . Oxycodone HCl 10 MG TABS Take 1 tablet (10 mg total) by mouth every 4 (four) hours as needed. 90 tablet 0   No current facility-administered medications for this visit.   Facility-Administered Medications Ordered in Other Visits  Medication Dose Route Frequency Provider Last Rate Last Admin  . CARBOplatin (PARAPLATIN) 250 mg in sodium chloride 0.9 % 250 mL chemo infusion  250 mg Intravenous Once [name], MD 550 mL/hr at 11/02/19 1444 250 mg at 11/02/19 1444     OBJECTIVE: Vitals:   11/02/19 0924  BP: (!) 141/78  Pulse: 78  Resp: 18  Temp: 98 F (36.7 C)  SpO2: 98%     Body mass index is 22.59 kg/m.    ECOG FS:1 - Symptomatic but completely ambulatory  General: Well-developed, well-nourished, no acute distress. Eyes: Pink conjunctiva, anicteric sclera. HEENT: Normocephalic, moist mucous membranes. Lungs: No audible wheezing or coughing. Heart: Regular rate and rhythm. Abdomen: Soft, nontender, no obvious distention. Musculoskeletal: 2+ bilateral lower extremity edema.  Left greater than right. Neuro: Alert, answering all questions appropriately. Cranial nerves grossly intact. Skin: No rashes  or petechiae noted. Psych: Normal affect.  LAB RESULTS:  Lab Results  Component Value Date   NA 139 11/02/2019   K 3.9 11/02/2019   CL 104 11/02/2019   CO2 22 11/02/2019   GLUCOSE 152 (H) 11/02/2019   BUN 12 11/02/2019   CREATININE 0.76 11/02/2019   CALCIUM 8.5 (L) 11/02/2019   PROT 7.5 11/02/2019   ALBUMIN 3.5 11/02/2019   AST 27 11/02/2019   ALT 9 11/02/2019   ALKPHOS 102 11/02/2019   BILITOT 0.6 11/02/2019   GFRNONAA >60 11/02/2019   GFRAA >60 11/02/2019    Lab Results  Component Value Date   WBC 5.7 11/02/2019   NEUTROABS 4.2 11/02/2019   HGB 10.3 (L) 11/02/2019   HCT 31.3 (L) 11/02/2019   MCV 89.7 11/02/2019   PLT 251 11/02/2019     STUDIES: No results found. ONCOLOGY HISTORY:  Patient underwent surgery on February 26, 2016. Adjuvant treatment was recommended at that time, but patient refused on multiple occations and missed multiple follow-up appointments in the interim.  After agreeing to adjuvant chemotherapy, patient finally completed 6 cycles of carboplatinum and Taxol on April 21, 2017.  PET scan results from May 26, 2017 reviewed independently with interval decrease in size of left adnexal lesion.  There was residual hypermetabolic activity possibly indicating residual disease therefore patient was given a referral to radiation oncology for local control.  She completed a second round of XRT to this area completing on March 08, 2018.  She was subsequently initiated on maintenance letrozole which has now been discontinued given her progressive disease.  ASSESSMENT: Stage IIIa high-grade serous endometrial carcinoma.  PLAN:    1. Stage IIIa high-grade serous endometrial carcinoma: CT scan results from September 05, 2019 reviewed independently with increasing size of known pelvic sidewall mass likely indicating progression of disease.  Subsequent PET scan on September 14, 2019 confirmed progression of disease without evidence of distant metastasis.  Patient last received chemotherapy in January 2019 and agreed to reinitiate treatment with dose reduced carboplatin and Taxol every 3 weeks.  Patient will also require Neulasta support.  Proceed with cycle 3 of treatment today.  Return to clinic in 3 weeks for further evaluation and consideration of cycle 4.  Will reimage at the conclusion of cycle 4. 2. Pain: Improved.  Patient wishes to continue on her current narcotic regimen.  Appreciate palliative care input.  Continue oxycodone as needed. 3. Hypokalemia: Resolved.  Continue oral potassium supplementation as prescribed. 4.  Lymphedema: Chronic and relatively unchanged.  5.  DVT: Diagnosed on October 12, 2017.  Continue Eliquis as prescribed.  Given patient's altered anatomy from her  malignancy as well as scarring from XRT, will continue on extended Eliquis.   6.  Anemia: Hemoglobin trended down slightly to 10.3, monitor. 7.  Osteoporosis: Patient had a DEXA scan on May 12, 2018 which revealed a T score of -3.9.  Patient has declined any further bone mineral densities.  She also declined treatment with Fosamax, but agreed to continue with calcium and vitamin D. 8.  Constipation: Continue MiraLAX as needed.  Patient expressed understanding and was in agreement with this plan. She also understands that She can call clinic at any time with any questions, concerns, or complaints.    [name], MD   11/02/2019 2:49 PM

## 2019-11-02 ENCOUNTER — Other Ambulatory Visit: Payer: Self-pay

## 2019-11-02 ENCOUNTER — Inpatient Hospital Stay (HOSPITAL_BASED_OUTPATIENT_CLINIC_OR_DEPARTMENT_OTHER): Payer: Medicare Other | Admitting: Oncology

## 2019-11-02 ENCOUNTER — Inpatient Hospital Stay: Payer: Medicare Other

## 2019-11-02 ENCOUNTER — Inpatient Hospital Stay: Payer: Medicare Other | Attending: Oncology

## 2019-11-02 ENCOUNTER — Other Ambulatory Visit: Payer: Self-pay | Admitting: Hospice and Palliative Medicine

## 2019-11-02 VITALS — BP 141/78 | HR 78 | Temp 98.0°F | Resp 18 | Wt 123.5 lb

## 2019-11-02 DIAGNOSIS — C541 Malignant neoplasm of endometrium: Secondary | ICD-10-CM

## 2019-11-02 DIAGNOSIS — Z5111 Encounter for antineoplastic chemotherapy: Secondary | ICD-10-CM | POA: Insufficient documentation

## 2019-11-02 DIAGNOSIS — Z5189 Encounter for other specified aftercare: Secondary | ICD-10-CM | POA: Diagnosis not present

## 2019-11-02 LAB — CBC WITH DIFFERENTIAL/PLATELET
Abs Immature Granulocytes: 0.02 10*3/uL (ref 0.00–0.07)
Basophils Absolute: 0 10*3/uL (ref 0.0–0.1)
Basophils Relative: 1 %
Eosinophils Absolute: 0 10*3/uL (ref 0.0–0.5)
Eosinophils Relative: 0 %
HCT: 31.3 % — ABNORMAL LOW (ref 36.0–46.0)
Hemoglobin: 10.3 g/dL — ABNORMAL LOW (ref 12.0–15.0)
Immature Granulocytes: 0 %
Lymphocytes Relative: 13 %
Lymphs Abs: 0.8 10*3/uL (ref 0.7–4.0)
MCH: 29.5 pg (ref 26.0–34.0)
MCHC: 32.9 g/dL (ref 30.0–36.0)
MCV: 89.7 fL (ref 80.0–100.0)
Monocytes Absolute: 0.6 10*3/uL (ref 0.1–1.0)
Monocytes Relative: 11 %
Neutro Abs: 4.2 10*3/uL (ref 1.7–7.7)
Neutrophils Relative %: 75 %
Platelets: 251 10*3/uL (ref 150–400)
RBC: 3.49 MIL/uL — ABNORMAL LOW (ref 3.87–5.11)
RDW: 15.9 % — ABNORMAL HIGH (ref 11.5–15.5)
WBC: 5.7 10*3/uL (ref 4.0–10.5)
nRBC: 0 % (ref 0.0–0.2)

## 2019-11-02 LAB — COMPREHENSIVE METABOLIC PANEL
ALT: 9 U/L (ref 0–44)
AST: 27 U/L (ref 15–41)
Albumin: 3.5 g/dL (ref 3.5–5.0)
Alkaline Phosphatase: 102 U/L (ref 38–126)
Anion gap: 13 (ref 5–15)
BUN: 12 mg/dL (ref 8–23)
CO2: 22 mmol/L (ref 22–32)
Calcium: 8.5 mg/dL — ABNORMAL LOW (ref 8.9–10.3)
Chloride: 104 mmol/L (ref 98–111)
Creatinine, Ser: 0.76 mg/dL (ref 0.44–1.00)
GFR calc Af Amer: >60 mL/min (ref >60)
GFR calc non Af Amer: >60 mL/min (ref >60)
Glucose, Bld: 152 mg/dL — ABNORMAL HIGH (ref 70–99)
Potassium: 3.9 mmol/L (ref 3.5–5.1)
Sodium: 139 mmol/L (ref 135–145)
Total Bilirubin: 0.6 mg/dL (ref 0.3–1.2)
Total Protein: 7.5 g/dL (ref 6.5–8.1)

## 2019-11-02 MED ORDER — OXYCODONE HCL 10 MG PO TABS: 10.0000 mg | ORAL_TABLET | ORAL | 0 refills | Status: DC | PRN

## 2019-11-02 MED ORDER — FAMOTIDINE IN NACL 20-0.9 MG/50ML-% IV SOLN
20.0000 mg | Freq: Once | INTRAVENOUS | Status: AC
  Administered 2019-11-02: 20 mg via INTRAVENOUS
  Filled 2019-11-02: qty 50

## 2019-11-02 MED ORDER — SODIUM CHLORIDE 0.9 % IV SOLN
10.0000 mg | Freq: Once | INTRAVENOUS | Status: AC
  Administered 2019-11-02: 10 mg via INTRAVENOUS
  Filled 2019-11-02: qty 10

## 2019-11-02 MED ORDER — OXYCODONE HCL 5 MG PO TABS: 10.0000 mg | ORAL_TABLET | ORAL | 0 refills | Status: DC | PRN

## 2019-11-02 MED ORDER — SODIUM CHLORIDE 0.9 % IV SOLN
250.0000 mg | Freq: Once | INTRAVENOUS | Status: AC
  Administered 2019-11-02: 250 mg via INTRAVENOUS
  Filled 2019-11-02: qty 25

## 2019-11-02 MED ORDER — PALONOSETRON HCL INJECTION 0.25 MG/5ML
0.2500 mg | Freq: Once | INTRAVENOUS | Status: AC
  Administered 2019-11-02: 0.25 mg via INTRAVENOUS
  Filled 2019-11-02: qty 5

## 2019-11-02 MED ORDER — SODIUM CHLORIDE 0.9 % IV SOLN
Freq: Once | INTRAVENOUS | Status: AC
  Filled 2019-11-02: qty 250

## 2019-11-02 MED ORDER — SODIUM CHLORIDE 0.9 % IV SOLN
136.0000 mg/m2 | Freq: Once | INTRAVENOUS | Status: AC
  Administered 2019-11-02: 234 mg via INTRAVENOUS
  Filled 2019-11-02: qty 39

## 2019-11-02 MED ORDER — DIPHENHYDRAMINE HCL 50 MG/ML IJ SOLN
25.0000 mg | Freq: Once | INTRAMUSCULAR | Status: AC
  Administered 2019-11-02: 25 mg via INTRAVENOUS
  Filled 2019-11-02: qty 1

## 2019-11-02 MED ORDER — PEGFILGRASTIM 6 MG/0.6ML ~~LOC~~ PSKT
6.0000 mg | PREFILLED_SYRINGE | Freq: Once | SUBCUTANEOUS | Status: AC
  Administered 2019-11-02: 6 mg via SUBCUTANEOUS
  Filled 2019-11-02: qty 0.6

## 2019-11-02 NOTE — Progress Notes (Signed)
Here for chemotherapy today. Doing well. Needs a refill on her oxycodone. Constipation remains an issue. Instructed her to increase her miralax to 2 capfuls in hot liquids.

## 2019-11-02 NOTE — Progress Notes (Signed)
Refill requested. Will switch back to 10mg  tablets to minimize pill burden. #90

## 2019-11-07 ENCOUNTER — Encounter: Payer: Self-pay | Admitting: Nurse Practitioner

## 2019-11-07 ENCOUNTER — Other Ambulatory Visit: Payer: Medicare Other | Admitting: Nurse Practitioner

## 2019-11-07 ENCOUNTER — Other Ambulatory Visit: Payer: Self-pay

## 2019-11-07 DIAGNOSIS — C541 Malignant neoplasm of endometrium: Secondary | ICD-10-CM

## 2019-11-07 DIAGNOSIS — Z515 Encounter for palliative care: Secondary | ICD-10-CM

## 2019-11-07 NOTE — Progress Notes (Signed)
[place] Consult Note Telephone: [phone]  Fax: [phone]  PATIENT NAME: Vickie Barton DOB: [birth year] MRN: [id number]  PRIMARY CARE PROVIDER:   [name], MD  REFERRING PROVIDER:  [name], MD [address] [facility] [place],  [place] [address]  RESPONSIBLE PARTY:   Self  Due to the COVID-19 crisis, this visit was done via telemedicine from my office and it was initiated and consent by this patient and or family.I did a phone visit in place of Telehealth as Telehealth was not available   RECOMMENDATIONS and PLAN: 1.ACP: DNR in place  2.Chronic painsecondary tocancerresolved with infusions; She has been experiencing increase in bilateral OA knee pain but tylenol does relief and once rain subsides. Offered voltaren gel or aspercreame but declined. "too messy"  4.Palliative care encounter Palliative medicine team will continue to support patient, patient's family, and medical team. Visit consisted of counseling and education dealing with the complex and emotionally intense issues of symptom management and palliative care in the setting of serious and potentially life-threatening illness  5. F/u 4 weeks after last treatment, scans results for further discussion of goc, prior to her going on her trip to [place] to stay with her niece.  I spent 50 minutes providing this consultation,  from 3:40pm to 4:30pm. More than 50% of the time in this consultation was spent coordinating communication.   HISTORY OF PRESENT ILLNESS:  Vickie Barton is a 86 y.o. year old female with multiple medical problems including Stage IIIa high-grade serous endometrial carcinoma, Congestive heart failure, DVT 03/2017, endometrial cancer 01/2016 with hysterectomy, chemo, radiation also internalbradytherapy on 02/2018, hypothyroidism, hypertension, hyperlipidemia, arthritis, tonsillectomy, TEE without cardioversion,  laparoscopic bilateral salpingooophorectomy. Schedule palliative care follow-up visit for Vickie Barton via telemedicine, telephonic is video not available. We talked about how Vickie Barton has been feeling. Vickie Barton endorses she is starting to feel better. Vickie Barton did have a infusion 8 / 12 / 2021 and it takes her some time to start feeling better. Vickie Barton describes her symptoms as nausea with no vomiting. Vickie Barton does have antiemetic ordered but she declines to take it as she feels like she takes too much medicine already. Vickie Barton endorses she has gained 3 lbs. Vickie Barton she tries to make sure she eats and gets proper nutrition. We talked about last visit with Dr Grayland Ormond Oncologist. We talked about next treatment the first part of September and then repeat scans. Vickie. Barthel is hopeful that it is improving because she is no longer experiencing pain. Vickie. [place] does endorse though that today being a rainy day she is having more [place] to arthritis pain in her knees which Tylenol does help. Recommended over-the-counter Aspercreme or Voltaren. Vickie Barton decline saying that it was too messy. We talked about medical goals of care, treatments. We talked about her mobility as she is independent, ambulatory was no recent falls. No recent wounds, infections, hospitalizations. We talked about her upcoming trip back to [place] to stay with her niece. Vickie Barton talked about her nieces house that "she treats me like a queen". We talked about quality of life, coping strategies. We talked about role of palliative care and plan of care. We talked about follow-up palliative care visit after the next set of scans for further discussion of goals of care, treatment options and proceeding forward. Vickie Barton in agreement and appointment scheduled. Therapeutic listening, emotional support provided. Contact information. Questions answer to  satisfaction  Dr Grayland [facility] Oncology  History/plan ONCOLOGY HISTORY: Patient underwent surgery on February 26, 2016. Adjuvant treatment was recommended at that time, but patient refused on multiple occations and missed multiple follow-up appointments in the interim.  After agreeing to adjuvant chemotherapy, patient finally completed 6 cycles of carboplatinum and Taxol on April 21, 2017.  PET scan results from May 26, 2017 reviewed independently with interval decrease in size of left adnexal lesion.  There was residual hypermetabolic activity possibly indicating residual disease therefore patient was given a referral to radiation oncology for local control.  She completed a second round of XRT to this area completing on March 08, 2018.  She was subsequently initiated on maintenance letrozole which has now been discontinued given her progressive disease.  ASSESSMENT: Stage IIIa high-grade serous endometrial carcinoma.  PLAN:    1. Stage IIIa high-grade serous endometrial carcinoma: CT scan results from September 05, 2019 reviewed independently with increasing size of known pelvic sidewall mass likely indicating progression of disease.  Subsequent PET scan on September 14, 2019 confirmed progression of disease without evidence of distant metastasis.  Patient last received chemotherapy in January 2019 and agreed to reinitiate treatment with dose reduced carboplatin and Taxol every 3 weeks.  Patient will also require Neulasta support.  Proceed with cycle 3 of treatment today.  Return to clinic in 3 weeks for further evaluation and consideration of cycle 4.  Will reimage at the conclusion of cycle 4.  Palliative Care was asked to help to continue to address goals of care.   CODE STATUS: DNR  PPS: 60% HOSPICE ELIGIBILITY/DIAGNOSIS: TBD  PAST MEDICAL HISTORY:  Past Medical History:  Diagnosis Date  . Arthritis   . CHF (congestive heart failure) ([place])   . Constipation   . DVT (deep venous thrombosis) ([place]) 03/2017   left leg  . Endometrial  cancer ([place]) 01/28/2016   Hysterectomy, chemo + rad tx's, also internal brachytherapy on 02/2018.   [name] Hyperlipidemia   . Hypertension   . Hypothyroidism     SOCIAL HX:  Social History   Tobacco Use  . Smoking status: Former Research scientist (life sciences)  . Smokeless tobacco: Never Used  Substance Use Topics  . Alcohol use: No    ALLERGIES:  Allergies  Allergen Reactions  . Sulfa Antibiotics Rash  . Zinc Rash     PERTINENT MEDICATIONS:  Outpatient Encounter Medications as of 11/07/2019  Medication Sig  . atorvastatin (LIPITOR) 10 MG tablet Take 1 tablet (10 mg total) by mouth daily at 6 PM.  . ELIQUIS 5 MG TABS tablet TAKE 2 TABLETS BY MOUTH TWICE A DAY  . furosemide (LASIX) 20 MG tablet Take 20 mg by mouth 2 (two) times daily.  [name] levothyroxine (SYNTHROID, LEVOTHROID) 150 MCG tablet TAKE 150 MCG BY MOUTH DAILY BEFORE BREAKFAST ON EMPTY STOMACH WITH A GLASS OF WATER 30-60MINS BEFORE BREAKFAST  . metoprolol tartrate (LOPRESSOR) 25 MG tablet Take 0.5 tablets (12.5 mg total) by mouth 2 (two) times daily.  . ondansetron (ZOFRAN) 8 MG tablet Take 1 tablet (8 mg total) by mouth every 8 (eight) hours as needed for nausea or vomiting.  [name] oxyCODONE (OXY IR/ROXICODONE) 5 MG immediate release tablet Take 2 tablets (10 mg total) by mouth every 4 (four) hours as needed for severe pain.  . Oxycodone HCl 10 MG TABS Take 1 tablet (10 mg total) by mouth every 4 (four) hours as needed.   No facility-administered encounter medications on file as of 11/07/2019.    PHYSICAL EXAM:  Deferred  Rainelle Sulewski Ihor Gully, NP

## 2019-11-18 NOTE — Progress Notes (Signed)
[facility]  [phone] [phone]  ID: Vickie Barton Arkwright OB: Nov 14, 1930  MR#: [id number]  TLX#:[id number]  Patient Care Team: [name], MD as PCP - General (Family Medicine) Rockey Situ Kathlene November, MD as PCP - Cardiology (Cardiology) [name], NP as Nurse Practitioner (Hospice and Palliative Medicine) Borders, [name], NP as Nurse Practitioner (Hospice and Palliative Medicine) [name], MD as Consulting Physician (Oncology)  I connected with Vickie Barton Vickie Barton on 11/23/19 at  9:30 AM EDT by video enabled telemedicine visit and verified that I am speaking with the correct person using two identifiers.   I discussed the limitations, risks, security and privacy concerns of performing an evaluation and management service by telemedicine and the availability of in-person appointments. I also discussed with the patient that there may be a patient responsible charge related to this service. The patient expressed understanding and agreed to proceed.   Other persons participating in the visit and their role in the encounter: Patient, MD.  Patient's location: Clinic. Provider's location: Home.  CHIEF COMPLAINT: Stage IIIa high-grade serous endometrial carcinoma.  INTERVAL HISTORY: Patient agreed to video assisted telemedicine visit for further evaluation and consideration of cycle 4 of carboplatinum and Taxol.  She continues to tolerate her treatments well without significant side effects.  She continues to have left hip pain, but this has improved since starting treatment and is well controlled with her current narcotic regimen.  Her chronic leg edema is also improved.  She does not complain of weakness or fatigue.  She has no neurologic complaints. She denies any recent fevers or illnesses.  She denies any chest pain, shortness of breath, cough, or hemoptysis.  She denies any nausea, vomiting, constipation, or diarrhea.  She has no  urinary complaints.  Patient offers no further specific complaints today.  REVIEW OF SYSTEMS:   Review of Systems  Constitutional: Negative.  Negative for fever, malaise/fatigue and weight loss.  Respiratory: Negative.  Negative for cough and shortness of breath.   Cardiovascular: Positive for leg swelling. Negative for chest pain.  Gastrointestinal: Positive for constipation. Negative for abdominal pain, nausea and vomiting.  Genitourinary: Negative.  Negative for dysuria.  Musculoskeletal: Positive for joint pain.  Skin: Negative.  Negative for rash.  Neurological: Negative.  Negative for sensory change, focal weakness, weakness and headaches.  Psychiatric/Behavioral: Negative.  The patient is not nervous/anxious.     As per HPI. Otherwise, a complete review of systems is negative.  PAST MEDICAL HISTORY: Past Medical History:  Diagnosis Date  . Arthritis   . CHF (congestive heart failure) ([place])   . Constipation   . DVT (deep venous thrombosis) ([place]) 03/2017   left leg  . Endometrial cancer ([place]) 01/28/2016   Hysterectomy, chemo + rad tx's, also internal brachytherapy on 02/2018.   [name] Hyperlipidemia   . Hypertension   . Hypothyroidism     PAST SURGICAL HISTORY: Past Surgical History:  Procedure Laterality Date  . CYSTOSCOPY W/ RETROGRADES Left 01/01/2017   Procedure: CYSTOSCOPY WITH RETROGRADE PYELOGRAM;  Surgeon: Nickie Retort, MD;  Location: [facility];  Service: Urology;  Laterality: Left;  . CYSTOSCOPY W/ RETROGRADES Left 04/23/2017   Procedure: CYSTOSCOPY WITH RETROGRADE PYELOGRAM;  Surgeon: Abbie Sons, MD;  Location: [facility];  Service: Urology;  Laterality: Left;  . CYSTOSCOPY W/ URETERAL STENT PLACEMENT Left 04/23/2017   Procedure: CYSTOSCOPY WITH STENT REMOVAL;  Surgeon: Abbie Sons, MD;  Location: [facility];  Service: Urology;  Laterality: Left;  .  CYSTOSCOPY WITH STENT PLACEMENT Left 01/01/2017   Procedure: CYSTOSCOPY WITH STENT PLACEMENT;  Surgeon:  Nickie Retort, MD;  Location: [facility];  Service: Urology;  Laterality: Left;  . DILATION AND CURETTAGE OF UTERUS    . HYSTEROSCOPY WITH D & C N/A 01/28/2016   Procedure: DILATATION AND CURETTAGE /HYSTEROSCOPY;  Surgeon: [name], MD;  Location: [facility];  Service: Gynecology;  Laterality: N/A;  . LAPAROSCOPIC BILATERAL SALPINGO OOPHERECTOMY Bilateral 02/26/2016   Procedure: LAPAROSCOPIC BILATERAL SALPINGO OOPHORECTOMY;  Surgeon: [name], MD;  Location: [facility];  Service: Gynecology;  Laterality: Bilateral;  . LAPAROSCOPIC HYSTERECTOMY N/A 02/26/2016   Procedure: HYSTERECTOMY TOTAL LAPAROSCOPIC;  Surgeon: [name], MD;  Location: [facility];  Service: Gynecology;  Laterality: N/A;  . SENTINEL NODE BIOPSY N/A 02/26/2016   Procedure: SENTINEL NODE BIOPSY;  Surgeon: [name], MD;  Location: [facility];  Service: Gynecology;  Laterality: N/A;  . TEE WITHOUT CARDIOVERSION N/A 06/01/2018   Procedure: TRANSESOPHAGEAL ECHOCARDIOGRAM (TEE);  Surgeon: [name], MD;  Location: [facility];  Service: Cardiovascular;  Laterality: N/A;  . TONSILLECTOMY      FAMILY HISTORY: Family History  Problem Relation Age of Onset  . Diabetes Father   . Heart disease Father   . Hypertension Father   . Stroke Father   . Diabetes Paternal Grandmother   . Heart disease Mother   . Hypertension Mother   . Stroke Mother   . Hypertension Sister   . Thyroid disease Sister   . Hypertension Brother     ADVANCED DIRECTIVES (Y/N):  N  HEALTH MAINTENANCE: Social History   Tobacco Use  . Smoking status: Former Research scientist (life sciences)  . Smokeless tobacco: Never Used  Vaping Use  . Vaping Use: Never used  Substance Use Topics  . Alcohol use: No  . Drug use: No     Colonoscopy:  PAP:  Bone density:  Lipid panel:  Allergies  Allergen Reactions  . Sulfa Antibiotics Rash  . Zinc Rash    Current Outpatient Medications  Medication Sig Dispense Refill  . atorvastatin (LIPITOR) 10 MG tablet Take  1 tablet (10 mg total) by mouth daily at 6 PM. 30 tablet 0  . ELIQUIS 5 MG TABS tablet TAKE 2 TABLETS BY MOUTH TWICE A DAY 120 tablet 5  . furosemide (LASIX) 20 MG tablet Take 20 mg by mouth 2 (two) times daily.    [name] levothyroxine (SYNTHROID, LEVOTHROID) 150 MCG tablet TAKE 150 MCG BY MOUTH DAILY BEFORE BREAKFAST ON EMPTY STOMACH WITH A GLASS OF WATER 30-60MINS BEFORE BREAKFAST  2  . metoprolol tartrate (LOPRESSOR) 25 MG tablet Take 0.5 tablets (12.5 mg total) by mouth 2 (two) times daily. 60 tablet 0  . ondansetron (ZOFRAN) 8 MG tablet Take 1 tablet (8 mg total) by mouth every 8 (eight) hours as needed for nausea or vomiting. 20 tablet 0  . oxyCODONE (OXY IR/ROXICODONE) 5 MG immediate release tablet Take 2 tablets (10 mg total) by mouth every 4 (four) hours as needed for severe pain. 180 tablet 0  . Oxycodone HCl 10 MG TABS Take 1 tablet (10 mg total) by mouth every 4 (four) hours as needed. 90 tablet 0   No current facility-administered medications for this visit.     OBJECTIVE: Vitals:   11/23/19 0935  BP: 131/71  Pulse: 80  Resp: 20  Temp: (!) 97.1 F (36.2 C)  SpO2: 97%     Body mass index is 22.11 kg/m.    ECOG  FS:1 - Symptomatic but completely ambulatory  General: Well-developed, well-nourished, no acute distress. HEENT: Normocephalic. Neuro: Alert, answering all questions appropriately. Cranial nerves grossly intact. Psych: Normal affect.   LAB RESULTS:  Lab Results  Component Value Date   NA 138 11/23/2019   K 4.5 11/23/2019   CL 99 11/23/2019   CO2 24 11/23/2019   GLUCOSE 133 (H) 11/23/2019   BUN 12 11/23/2019   CREATININE 0.73 11/23/2019   CALCIUM 8.6 (L) 11/23/2019   PROT 7.5 11/23/2019   ALBUMIN 3.7 11/23/2019   AST 28 11/23/2019   ALT 12 11/23/2019   ALKPHOS 106 11/23/2019   BILITOT 0.5 11/23/2019   GFRNONAA >60 11/23/2019   GFRAA >60 11/23/2019    Lab Results  Component Value Date   WBC 5.2 11/23/2019   NEUTROABS 3.7 11/23/2019   HGB 10.3 (L)  11/23/2019   HCT 31.7 (L) 11/23/2019   MCV 91.9 11/23/2019   PLT 249 11/23/2019    STUDIES: No results found.    ONCOLOGY HISTORY: Patient underwent surgery on February 26, 2016. Adjuvant treatment was recommended at that time, but patient refused on multiple occations and missed multiple follow-up appointments in the interim.  After agreeing to adjuvant chemotherapy, patient finally completed 6 cycles of carboplatinum and Taxol on April 21, 2017.  PET scan results from May 26, 2017 reviewed independently with interval decrease in size of left adnexal lesion.  There was residual hypermetabolic activity possibly indicating residual disease therefore patient was given a referral to radiation oncology for local control.  She completed a second round of XRT to this area completing on March 08, 2018.  She was subsequently initiated on maintenance letrozole which has now been discontinued given her progressive disease.   ASSESSMENT: Stage IIIa high-grade serous endometrial carcinoma.  PLAN:    1. Stage IIIa high-grade serous endometrial carcinoma: CT scan results from September 05, 2019 reviewed independently with increasing size of known pelvic sidewall mass likely indicating progression of disease.  Subsequent PET scan on September 14, 2019 confirmed progression of disease without evidence of distant metastasis.  Patient last received chemotherapy in January 2019 and agreed to reinitiate treatment with dose reduced carboplatin and Taxol every 3 weeks.  Patient will also require onPro Neulasta support.  Proceed with cycle 4 of treatment today.  Return to clinic in 3 weeks for further evaluation and consideration of cycle 5 if necessary.  Will reimage with PET scan 1 to 2 days prior to next cycle.  2. Pain: Improved.  Patient wishes to continue on her current narcotic regimen.  Appreciate palliative care input.  Continue oxycodone as needed. 3. Hypokalemia: Resolved.  Continue oral potassium supplementation  as prescribed. 4.  Lymphedema: Mildly improved. 5.  DVT: Diagnosed on October 12, 2017.  Continue Eliquis as prescribed.  Given patient's altered anatomy from her malignancy as well as scarring from XRT, will continue on extended Eliquis.   6.  Anemia: Hemoglobin trended down slightly to 10.3, monitor. 7.  Osteoporosis: Patient had a DEXA scan on May 12, 2018 which revealed a T score of -3.9.  Patient has declined any further bone mineral densities.  She also declined treatment with Fosamax, but agreed to continue with calcium and vitamin D. 8.  Constipation: Patient does not complain of this today.  Continue MiraLAX as needed. 9.  Anemia: Chronic and unchanged.  Patient's hemoglobin is 10.3 today.  I provided 30 minutes of face-to-face video visit time during this encounter which included chart review, counseling, and coordination  of care as documented above.   Patient expressed understanding and was in agreement with this plan. She also understands that She can call clinic at any time with any questions, concerns, or complaints.    [name], MD   11/23/2019 9:55 AM

## 2019-11-22 ENCOUNTER — Encounter: Payer: Self-pay | Admitting: Oncology

## 2019-11-22 NOTE — Progress Notes (Signed)
Patient denies any pain or concerns at this time. Would like to discuss getting oxycodone refill but states she still has enough for another week on hand.

## 2019-11-23 ENCOUNTER — Inpatient Hospital Stay: Payer: Medicare Other

## 2019-11-23 ENCOUNTER — Inpatient Hospital Stay (HOSPITAL_BASED_OUTPATIENT_CLINIC_OR_DEPARTMENT_OTHER): Payer: Medicare Other | Admitting: Oncology

## 2019-11-23 ENCOUNTER — Inpatient Hospital Stay: Payer: Medicare Other | Attending: Oncology

## 2019-11-23 ENCOUNTER — Other Ambulatory Visit: Payer: Self-pay

## 2019-11-23 VITALS — BP 131/71 | HR 80 | Temp 97.1°F | Resp 20 | Wt 120.9 lb

## 2019-11-23 DIAGNOSIS — Z5189 Encounter for other specified aftercare: Secondary | ICD-10-CM | POA: Diagnosis not present

## 2019-11-23 DIAGNOSIS — Z5111 Encounter for antineoplastic chemotherapy: Secondary | ICD-10-CM | POA: Insufficient documentation

## 2019-11-23 DIAGNOSIS — C541 Malignant neoplasm of endometrium: Secondary | ICD-10-CM

## 2019-11-23 LAB — COMPREHENSIVE METABOLIC PANEL
ALT: 12 U/L (ref 0–44)
AST: 28 U/L (ref 15–41)
Albumin: 3.7 g/dL (ref 3.5–5.0)
Alkaline Phosphatase: 106 U/L (ref 38–126)
Anion gap: 15 (ref 5–15)
BUN: 12 mg/dL (ref 8–23)
CO2: 24 mmol/L (ref 22–32)
Calcium: 8.6 mg/dL — ABNORMAL LOW (ref 8.9–10.3)
Chloride: 99 mmol/L (ref 98–111)
Creatinine, Ser: 0.73 mg/dL (ref 0.44–1.00)
GFR calc Af Amer: >60 mL/min (ref >60)
GFR calc non Af Amer: >60 mL/min (ref >60)
Glucose, Bld: 133 mg/dL — ABNORMAL HIGH (ref 70–99)
Potassium: 4.5 mmol/L (ref 3.5–5.1)
Sodium: 138 mmol/L (ref 135–145)
Total Bilirubin: 0.5 mg/dL (ref 0.3–1.2)
Total Protein: 7.5 g/dL (ref 6.5–8.1)

## 2019-11-23 LAB — CBC WITH DIFFERENTIAL/PLATELET
Abs Immature Granulocytes: 0.02 10*3/uL (ref 0.00–0.07)
Basophils Absolute: 0 10*3/uL (ref 0.0–0.1)
Basophils Relative: 1 %
Eosinophils Absolute: 0 10*3/uL (ref 0.0–0.5)
Eosinophils Relative: 0 %
HCT: 31.7 % — ABNORMAL LOW (ref 36.0–46.0)
Hemoglobin: 10.3 g/dL — ABNORMAL LOW (ref 12.0–15.0)
Immature Granulocytes: 0 %
Lymphocytes Relative: 18 %
Lymphs Abs: 0.9 10*3/uL (ref 0.7–4.0)
MCH: 29.9 pg (ref 26.0–34.0)
MCHC: 32.5 g/dL (ref 30.0–36.0)
MCV: 91.9 fL (ref 80.0–100.0)
Monocytes Absolute: 0.6 10*3/uL (ref 0.1–1.0)
Monocytes Relative: 11 %
Neutro Abs: 3.7 10*3/uL (ref 1.7–7.7)
Neutrophils Relative %: 70 %
Platelets: 249 10*3/uL (ref 150–400)
RBC: 3.45 MIL/uL — ABNORMAL LOW (ref 3.87–5.11)
RDW: 16.7 % — ABNORMAL HIGH (ref 11.5–15.5)
WBC: 5.2 10*3/uL (ref 4.0–10.5)
nRBC: 0 % (ref 0.0–0.2)

## 2019-11-23 MED ORDER — SODIUM CHLORIDE 0.9 % IV SOLN
150.0000 mg/m2 | Freq: Once | INTRAVENOUS | Status: AC
  Administered 2019-11-23: 258 mg via INTRAVENOUS
  Filled 2019-11-23: qty 43

## 2019-11-23 MED ORDER — SODIUM CHLORIDE 0.9 % IV SOLN
10.0000 mg | Freq: Once | INTRAVENOUS | Status: AC
  Administered 2019-11-23: 10 mg via INTRAVENOUS
  Filled 2019-11-23: qty 10

## 2019-11-23 MED ORDER — SODIUM CHLORIDE 0.9 % IV SOLN
Freq: Once | INTRAVENOUS | Status: AC
  Filled 2019-11-23: qty 250

## 2019-11-23 MED ORDER — DIPHENHYDRAMINE HCL 50 MG/ML IJ SOLN
25.0000 mg | Freq: Once | INTRAMUSCULAR | Status: AC
  Administered 2019-11-23: 25 mg via INTRAVENOUS
  Filled 2019-11-23: qty 1

## 2019-11-23 MED ORDER — PEGFILGRASTIM 6 MG/0.6ML ~~LOC~~ PSKT
6.0000 mg | PREFILLED_SYRINGE | Freq: Once | SUBCUTANEOUS | Status: AC
  Administered 2019-11-23: 6 mg via SUBCUTANEOUS
  Filled 2019-11-23: qty 0.6

## 2019-11-23 MED ORDER — HEPARIN SOD (PORK) LOCK FLUSH 100 UNIT/ML IV SOLN
INTRAVENOUS | Status: AC
  Filled 2019-11-23: qty 5

## 2019-11-23 MED ORDER — FAMOTIDINE IN NACL 20-0.9 MG/50ML-% IV SOLN
20.0000 mg | Freq: Once | INTRAVENOUS | Status: AC
  Administered 2019-11-23: 20 mg via INTRAVENOUS
  Filled 2019-11-23: qty 50

## 2019-11-23 MED ORDER — SODIUM CHLORIDE 0.9 % IV SOLN
250.0000 mg | Freq: Once | INTRAVENOUS | Status: AC
  Administered 2019-11-23: 250 mg via INTRAVENOUS
  Filled 2019-11-23: qty 25

## 2019-11-23 MED ORDER — PALONOSETRON HCL INJECTION 0.25 MG/5ML
0.2500 mg | Freq: Once | INTRAVENOUS | Status: AC
  Administered 2019-11-23: 0.25 mg via INTRAVENOUS
  Filled 2019-11-23: qty 5

## 2019-11-23 NOTE — Progress Notes (Signed)
Verified taxol dose of 150mg /m2 with md

## 2019-12-06 ENCOUNTER — Other Ambulatory Visit: Payer: Self-pay

## 2019-12-06 ENCOUNTER — Encounter: Payer: Self-pay | Admitting: Nurse Practitioner

## 2019-12-06 ENCOUNTER — Other Ambulatory Visit: Payer: Medicare Other | Admitting: Nurse Practitioner

## 2019-12-06 DIAGNOSIS — C541 Malignant neoplasm of endometrium: Secondary | ICD-10-CM | POA: Diagnosis not present

## 2019-12-06 DIAGNOSIS — Z515 Encounter for palliative care: Secondary | ICD-10-CM

## 2019-12-06 NOTE — Progress Notes (Signed)
[facility] Consult Note Telephone: [phone]  Fax: [phone]  PATIENT NAME: Vickie Barton DOB: 02/08/1931 MRN: [id number]  PRIMARY CARE PROVIDER:   [name], MD  REFERRING PROVIDER:  [name], MD [address] [facility] [place],  [place] [address]  RESPONSIBLE PARTY:Self  Due to the COVID-19 crisis, this visit was done via telemedicine from my office and it was initiated and consent by this patient and or family.I did a phone visit in place of Telehealth as Telehealth was not available   RECOMMENDATIONS and PLAN: 1.ACP: DNR in place  2.Chronic painsecondary tocancerresolved with infusions; She has been experiencing increase in bilateral OA knee pain but tylenol does relief and once rain subsides. Offered voltaren gel or aspercreame but declined. "too messy"  4.Palliative care encounter Palliative medicine team will continue to support patient, patient's family, and medical team. Visit consisted of counseling and education dealing with the complex and emotionally intense issues of symptom management and palliative care in the setting of serious and potentially life-threatening illness  5. F/u 2 weeks after last treatment, scans results for further discussion of goc, prior to her going on her trip to [place] to stay with her niece.  I spent 50 minutes providing this consultation,  from 3:00pm to 3:5-pm. More than 50% of the time in this consultation was spent coordinating communication.   HISTORY OF PRESENT ILLNESS:  Vickie Barton is a 86 y.o. year old female with multiple medical problems including Stage IIIa high-grade serous endometrial carcinoma,Congestive heart failure, DVT 03/2017, endometrial cancer 01/2016 with hysterectomy, chemo, radiation also internalbradytherapy on 02/2018, hypothyroidism, hypertension, hyperlipidemia, arthritis, tonsillectomy, TEE without cardioversion,  laparoscopic bilateral salpingooophorectomy.Palliative care follow-up visit telemedicine telephonic per Ms. Arp . I called Ms Cummings. We talked about purpose of palliative care visit. Ms Alto in agreement. We talked about how she has been feeling. Ms. Pedretti endorses she has been doing well. Ms. Wahlberg been doing good with the treatments. Ms Azizi endorses that times she is nauseous but it is manageable. Ms. Birchard does continue to have bouts of constipation though she has had the most of her life. Ms Stickley endorses that she is eating three meals a day and not losing weight. No other side effects reported. Ms. Stallone endorses that the pain has greatly improved and on occasion she does take one of Oxycodone with relief. We talked about upcoming plan for follow-up scans and then possibly a few more treatments if needed. Ms Mucha endorses she is really hoping that she will not need any further treatment. Ms. Laubscher endorses she is ready to be completed and give herself a break for some time. We talked about medical goals of care. We talked about her functional abilities as she continues to be independent. We talked about once the scans are complete and she has a follow-up appointment with Dr Grayland Ormond, Oncology will determine whether she goes to [place] to stay with her niece for sometime. Ms. Haye endorses that her niece "treats her like a queen". We talked about quality of life. We talked about role of palliative care and plan of care. Discuss that will follow up telemedicine visit in 2 weeks once the scans are complete and she had her appointment with Dr Grayland Ormond then again to determine next steps. Ms. Pickart in agreement, appointment scheduled. Therapeutic listening and emotional support provided. Contact information. Questions answered to satisfaction.  Dr Grayland Ormond Oncology Plan:  1. Stage IIIa high-grade serous endometrial  carcinoma: CT scan results from September 05, 2019  reviewed independently with increasing size of known pelvic sidewall mass likely indicating progression of disease.  Subsequent PET scan on September 14, 2019 confirmed progression of disease without evidence of distant metastasis.  Patient last received chemotherapy in January 2019 and agreed to reinitiate treatment with dose reduced carboplatin and Taxol every 3 weeks.  Patient will also require onPro Neulasta support.  Proceed with cycle 4 of treatment today.  Return to clinic in 3 weeks for further evaluation and consideration of cycle 5 if necessary.  Will reimage with PET scan 1 to 2 days prior to next cycle.   Palliative Care was asked to help to continue to address goals of care.   CODE STATUS: DNR  PPS: 60% HOSPICE ELIGIBILITY/DIAGNOSIS: TBD  PAST MEDICAL HISTORY:  Past Medical History:  Diagnosis Date  . Arthritis   . CHF (congestive heart failure) ([place])   . Constipation   . DVT (deep venous thrombosis) ([place]) 03/2017   left leg  . Endometrial cancer ([place]) 01/28/2016   Hysterectomy, chemo + rad tx's, also internal brachytherapy on 02/2018.   [name] Hyperlipidemia   . Hypertension   . Hypothyroidism     SOCIAL HX:  Social History   Tobacco Use  . Smoking status: Former Research scientist (life sciences)  . Smokeless tobacco: Never Used  Substance Use Topics  . Alcohol use: No    ALLERGIES:  Allergies  Allergen Reactions  . Sulfa Antibiotics Rash  . Zinc Rash     PERTINENT MEDICATIONS:  Outpatient Encounter Medications as of 12/06/2019  Medication Sig  . atorvastatin (LIPITOR) 10 MG tablet Take 1 tablet (10 mg total) by mouth daily at 6 PM.  . ELIQUIS 5 MG TABS tablet TAKE 2 TABLETS BY MOUTH TWICE A DAY  . furosemide (LASIX) 20 MG tablet Take 20 mg by mouth 2 (two) times daily.  [name] levothyroxine (SYNTHROID, LEVOTHROID) 150 MCG tablet TAKE 150 MCG BY MOUTH DAILY BEFORE BREAKFAST ON EMPTY STOMACH WITH A GLASS OF WATER 30-60MINS BEFORE BREAKFAST  . metoprolol tartrate (LOPRESSOR) 25 MG tablet Take 0.5  tablets (12.5 mg total) by mouth 2 (two) times daily.  . ondansetron (ZOFRAN) 8 MG tablet Take 1 tablet (8 mg total) by mouth every 8 (eight) hours as needed for nausea or vomiting.  [name] oxyCODONE (OXY IR/ROXICODONE) 5 MG immediate release tablet Take 2 tablets (10 mg total) by mouth every 4 (four) hours as needed for severe pain.  . Oxycodone HCl 10 MG TABS Take 1 tablet (10 mg total) by mouth every 4 (four) hours as needed.   No facility-administered encounter medications on file as of 12/06/2019.    PHYSICAL EXAM:   deferred Mattia Liford Ihor Gully, NP

## 2019-12-10 NOTE — Progress Notes (Deleted)
[place]  [phone]) [phone] [phone]  ID: Vickie Elk Montilla OB: [birth year]  MR#: [id number]  EQA#:[id number]  Patient Care Team: [name], MD as PCP - General (Family Medicine) Rockey Situ Kathlene November, MD as PCP - Cardiology (Cardiology) [name], NP as Nurse Practitioner (Hospice and Palliative Medicine) Borders, [name], NP as Nurse Practitioner (Hospice and Palliative Medicine) Vickie Huger, MD as Consulting Physician (Oncology)  I connected with Vickie Barton on 12/10/19 at  9:00 AM EDT by video enabled telemedicine visit and verified that I am speaking with the correct person using two identifiers.   I discussed the limitations, risks, security and privacy concerns of performing an evaluation and management service by telemedicine and the availability of in-person appointments. I also discussed with the patient that there may be a patient responsible charge related to this service. The patient expressed understanding and agreed to proceed.   Other persons participating in the visit and their role in the encounter: Patient, MD.  Patient's location: Clinic. Provider's location: Home.  CHIEF COMPLAINT: Stage IIIa high-grade serous endometrial carcinoma.  INTERVAL HISTORY: Patient agreed to video assisted telemedicine visit for further evaluation and consideration of cycle 4 of carboplatinum and Taxol.  She continues to tolerate her treatments well without significant side effects.  She continues to have left hip pain, but this has improved since starting treatment and is well controlled with her current narcotic regimen.  Her chronic leg edema is also improved.  She does not complain of weakness or fatigue.  She has no neurologic complaints. She denies any recent fevers or illnesses.  She denies any chest pain, shortness of breath, cough, or hemoptysis.  She denies any nausea, vomiting, constipation, or diarrhea.  She has no  urinary complaints.  Patient offers no further specific complaints today.  REVIEW OF SYSTEMS:   Review of Systems  Constitutional: Negative.  Negative for fever, malaise/fatigue and weight loss.  Respiratory: Negative.  Negative for cough and shortness of breath.   Cardiovascular: Positive for leg swelling. Negative for chest pain.  Gastrointestinal: Positive for constipation. Negative for abdominal pain, nausea and vomiting.  Genitourinary: Negative.  Negative for dysuria.  Musculoskeletal: Positive for joint pain.  Skin: Negative.  Negative for rash.  Neurological: Negative.  Negative for sensory change, focal weakness, weakness and headaches.  Psychiatric/Behavioral: Negative.  The patient is not nervous/anxious.     As per HPI. Otherwise, a complete review of systems is negative.  PAST MEDICAL HISTORY: Past Medical History:  Diagnosis Date  . Arthritis   . CHF (congestive heart failure) ([place])   . Constipation   . DVT (deep venous thrombosis) ([place]) 03/2017   left leg  . Endometrial cancer ([place]) 01/28/2016   Hysterectomy, chemo + rad tx's, also internal brachytherapy on 02/2018.   [name] Hyperlipidemia   . Hypertension   . Hypothyroidism     PAST SURGICAL HISTORY: Past Surgical History:  Procedure Laterality Date  . CYSTOSCOPY W/ RETROGRADES Left 01/01/2017   Procedure: CYSTOSCOPY WITH RETROGRADE PYELOGRAM;  Surgeon: Nickie Retort, MD;  Location: [facility];  Service: Urology;  Laterality: Left;  . CYSTOSCOPY W/ RETROGRADES Left 04/23/2017   Procedure: CYSTOSCOPY WITH RETROGRADE PYELOGRAM;  Surgeon: Abbie Sons, MD;  Location: [facility];  Service: Urology;  Laterality: Left;  . CYSTOSCOPY W/ URETERAL STENT PLACEMENT Left 04/23/2017   Procedure: CYSTOSCOPY WITH STENT REMOVAL;  Surgeon: Abbie Sons, MD;  Location: [facility];  Service: Urology;  Laterality: Left;  .  CYSTOSCOPY WITH STENT PLACEMENT Left 01/01/2017   Procedure: CYSTOSCOPY WITH STENT PLACEMENT;  Surgeon:  Nickie Retort, MD;  Location: [facility];  Service: Urology;  Laterality: Left;  . DILATION AND CURETTAGE OF UTERUS    . HYSTEROSCOPY WITH D & C N/A 01/28/2016   Procedure: DILATATION AND CURETTAGE /HYSTEROSCOPY;  Surgeon: [name], MD;  Location: [facility];  Service: Gynecology;  Laterality: N/A;  . LAPAROSCOPIC BILATERAL SALPINGO OOPHERECTOMY Bilateral 02/26/2016   Procedure: LAPAROSCOPIC BILATERAL SALPINGO OOPHORECTOMY;  Surgeon: [name], MD;  Location: [facility];  Service: Gynecology;  Laterality: Bilateral;  . LAPAROSCOPIC HYSTERECTOMY N/A 02/26/2016   Procedure: HYSTERECTOMY TOTAL LAPAROSCOPIC;  Surgeon: [name], MD;  Location: [facility];  Service: Gynecology;  Laterality: N/A;  . SENTINEL NODE BIOPSY N/A 02/26/2016   Procedure: SENTINEL NODE BIOPSY;  Surgeon: [name], MD;  Location: [facility];  Service: Gynecology;  Laterality: N/A;  . TEE WITHOUT CARDIOVERSION N/A 06/01/2018   Procedure: TRANSESOPHAGEAL ECHOCARDIOGRAM (TEE);  Surgeon: [name], MD;  Location: [facility];  Service: Cardiovascular;  Laterality: N/A;  . TONSILLECTOMY      FAMILY HISTORY: Family History  Problem Relation Age of Onset  . Diabetes Father   . Heart disease Father   . Hypertension Father   . Stroke Father   . Diabetes Paternal Grandmother   . Heart disease Mother   . Hypertension Mother   . Stroke Mother   . Hypertension Sister   . Thyroid disease Sister   . Hypertension Brother     ADVANCED DIRECTIVES (Y/N):  N  HEALTH MAINTENANCE: Social History   Tobacco Use  . Smoking status: Former Research scientist (life sciences)  . Smokeless tobacco: Never Used  Vaping Use  . Vaping Use: Never used  Substance Use Topics  . Alcohol use: No  . Drug use: No     Colonoscopy:  PAP:  Bone density:  Lipid panel:  Allergies  Allergen Reactions  . Sulfa Antibiotics Rash  . Zinc Rash    Current Outpatient Medications  Medication Sig Dispense Refill  . atorvastatin (LIPITOR) 10 MG tablet Take  1 tablet (10 mg total) by mouth daily at 6 PM. 30 tablet 0  . ELIQUIS 5 MG TABS tablet TAKE 2 TABLETS BY MOUTH TWICE A DAY 120 tablet 5  . furosemide (LASIX) 20 MG tablet Take 20 mg by mouth 2 (two) times daily.    [name] levothyroxine (SYNTHROID, LEVOTHROID) 150 MCG tablet TAKE 150 MCG BY MOUTH DAILY BEFORE BREAKFAST ON EMPTY STOMACH WITH A GLASS OF WATER 30-60MINS BEFORE BREAKFAST  2  . metoprolol tartrate (LOPRESSOR) 25 MG tablet Take 0.5 tablets (12.5 mg total) by mouth 2 (two) times daily. 60 tablet 0  . ondansetron (ZOFRAN) 8 MG tablet Take 1 tablet (8 mg total) by mouth every 8 (eight) hours as needed for nausea or vomiting. 20 tablet 0  . oxyCODONE (OXY IR/ROXICODONE) 5 MG immediate release tablet Take 2 tablets (10 mg total) by mouth every 4 (four) hours as needed for severe pain. 180 tablet 0  . Oxycodone HCl 10 MG TABS Take 1 tablet (10 mg total) by mouth every 4 (four) hours as needed. 90 tablet 0   No current facility-administered medications for this visit.     OBJECTIVE: There were no vitals filed for this visit.   There is no height or weight on file to calculate BMI.    ECOG FS:1 - Symptomatic but completely ambulatory  General: Well-developed, well-nourished, no acute distress.  HEENT: Normocephalic. Neuro: Alert, answering all questions appropriately. Cranial nerves grossly intact. Psych: Normal affect.   LAB RESULTS:  Lab Results  Component Value Date   NA 138 11/23/2019   K 4.5 11/23/2019   CL 99 11/23/2019   CO2 24 11/23/2019   GLUCOSE 133 (H) 11/23/2019   BUN 12 11/23/2019   CREATININE 0.73 11/23/2019   CALCIUM 8.6 (L) 11/23/2019   PROT 7.5 11/23/2019   ALBUMIN 3.7 11/23/2019   AST 28 11/23/2019   ALT 12 11/23/2019   ALKPHOS 106 11/23/2019   BILITOT 0.5 11/23/2019   GFRNONAA >60 11/23/2019   GFRAA >60 11/23/2019    Lab Results  Component Value Date   WBC 5.2 11/23/2019   NEUTROABS 3.7 11/23/2019   HGB 10.3 (L) 11/23/2019   HCT 31.7 (L) 11/23/2019    MCV 91.9 11/23/2019   PLT 249 11/23/2019    STUDIES: No results found.    ONCOLOGY HISTORY: Patient underwent surgery on February 26, 2016. Adjuvant treatment was recommended at that time, but patient refused on multiple occations and missed multiple follow-up appointments in the interim.  After agreeing to adjuvant chemotherapy, patient finally completed 6 cycles of carboplatinum and Taxol on April 21, 2017.  PET scan results from May 26, 2017 reviewed independently with interval decrease in size of left adnexal lesion.  There was residual hypermetabolic activity possibly indicating residual disease therefore patient was given a referral to radiation oncology for local control.  She completed a second round of XRT to this area completing on March 08, 2018.  She was subsequently initiated on maintenance letrozole which has now been discontinued given her progressive disease.   ASSESSMENT: Stage IIIa high-grade serous endometrial carcinoma.  PLAN:    1. Stage IIIa high-grade serous endometrial carcinoma: CT scan results from September 05, 2019 reviewed independently with increasing size of known pelvic sidewall mass likely indicating progression of disease.  Subsequent PET scan on September 14, 2019 confirmed progression of disease without evidence of distant metastasis.  Patient last received chemotherapy in January 2019 and agreed to reinitiate treatment with dose reduced carboplatin and Taxol every 3 weeks.  Patient will also require onPro Neulasta support.  Proceed with cycle 4 of treatment today.  Return to clinic in 3 weeks for further evaluation and consideration of cycle 5 if necessary.  Will reimage with PET scan 1 to 2 days prior to next cycle.  2. Pain: Improved.  Patient wishes to continue on her current narcotic regimen.  Appreciate palliative care input.  Continue oxycodone as needed. 3. Hypokalemia: Resolved.  Continue oral potassium supplementation as prescribed. 4.  Lymphedema: Mildly  improved. 5.  DVT: Diagnosed on October 12, 2017.  Continue Eliquis as prescribed.  Given patient's altered anatomy from her malignancy as well as scarring from XRT, will continue on extended Eliquis.   6.  Anemia: Hemoglobin trended down slightly to 10.3, monitor. 7.  Osteoporosis: Patient had a DEXA scan on May 12, 2018 which revealed a T score of -3.9.  Patient has declined any further bone mineral densities.  She also declined treatment with Fosamax, but agreed to continue with calcium and vitamin D. 8.  Constipation: Patient does not complain of this today.  Continue MiraLAX as needed. 9.  Anemia: Chronic and unchanged.  Patient's hemoglobin is 10.3 today.  I provided 30 minutes of face-to-face video visit time during this encounter which included chart review, counseling, and coordination of care as documented above.   Patient expressed understanding and was in  agreement with this plan. She also understands that She can call clinic at any time with any questions, concerns, or complaints.    Vickie Huger, MD   12/10/2019 10:21 PM

## 2019-12-11 ENCOUNTER — Telehealth: Payer: Self-pay | Admitting: *Deleted

## 2019-12-11 NOTE — Telephone Encounter (Signed)
Pt called to have her 9/23 lab/MD/Carbo/Taxal appts all cx due to having had a bad fall. Pt stated that she would call back at a later date to have appts R/S. I also seen where pt called and had her 12/12/19 PET Scan cx as well.

## 2019-12-11 NOTE — Telephone Encounter (Signed)
Will place f/u call to patient.

## 2019-12-11 NOTE — Telephone Encounter (Signed)
Ok, we can f/u with patient later with phone call.

## 2019-12-12 ENCOUNTER — Ambulatory Visit: Payer: Medicare Other

## 2019-12-13 ENCOUNTER — Telehealth: Payer: Self-pay | Admitting: *Deleted

## 2019-12-13 NOTE — Telephone Encounter (Signed)
I called patient this morning to follow up after she called earlier in the week to cancel appointments due to fall. Patient states that she hurt her left knee and ankle, states soreness is improving. She is currently using a walker but states she can ambulate and bear weight on that side with assistance of walker. Patient states she was not evaluated by anyone after fall, is not interested in evaluation at this time. Patient reports she did not hit her head when she fell. Patient has been evaluated by ortho in the past and is willing to see them if there is no further improvement. She still wants to hold off on rescheduling follow up for PET and follow up.

## 2019-12-14 ENCOUNTER — Inpatient Hospital Stay: Payer: Medicare Other | Admitting: Oncology

## 2019-12-14 ENCOUNTER — Other Ambulatory Visit: Payer: Self-pay | Admitting: *Deleted

## 2019-12-14 ENCOUNTER — Inpatient Hospital Stay: Payer: Medicare Other

## 2019-12-14 MED ORDER — OXYCODONE HCL 10 MG PO TABS
10.0000 mg | ORAL_TABLET | ORAL | 0 refills | Status: DC | PRN
Start: 2019-12-14 — End: 2019-12-29

## 2019-12-18 ENCOUNTER — Encounter: Payer: Self-pay | Admitting: Nurse Practitioner

## 2019-12-18 ENCOUNTER — Other Ambulatory Visit: Payer: Medicare Other | Admitting: Nurse Practitioner

## 2019-12-18 ENCOUNTER — Other Ambulatory Visit: Payer: Self-pay

## 2019-12-18 DIAGNOSIS — C541 Malignant neoplasm of endometrium: Secondary | ICD-10-CM

## 2019-12-18 DIAGNOSIS — Z515 Encounter for palliative care: Secondary | ICD-10-CM

## 2019-12-18 NOTE — Progress Notes (Signed)
[place] Consult Note Telephone: [phone]  Fax: [phone]  PATIENT NAME: Vickie Barton DOB: 1930-06-09 MRN: [id number]  PRIMARY CARE PROVIDER:   [name], MD  REFERRING PROVIDER:  [name], MD [address] [facility] [place],  [place] [address] RESPONSIBLE PARTY:Self  Due to the COVID-19 crisis, this visit was done via telemedicine from my office and it was initiated and consent by this patient and or family.I did a phone visit in place of Telehealth as Telehealth was not available   RECOMMENDATIONS and PLAN: 1.ACP: DNR in place  2.Chronic painsecondary tocancerresolved with infusions; She has been experiencing increase in bilateral OA knee pain but tylenol does relief and once rain subsides.   4.Palliative care encounter Palliative medicine team will continue to support patient, patient's family, and medical team. Visit consisted of counseling and education dealing with the complex and emotionally intense issues of symptom management and palliative care in the setting of serious and potentially life-threatening illness  5. F/u 4 weeks for follow up ongoing discussion of goc,   I spent 50 minutes providing this consultation,  from 3:15pm to 4:05pm. More than 50% of the time in this consultation was spent coordinating communication.   HISTORY OF PRESENT ILLNESS:  Vickie Barton is a 86 y.o. year old female with multiple medical problems including Stage IIIa high-grade serous endometrial carcinoma,Congestive heart failure, DVT 03/2017, endometrial cancer 01/2016 with hysterectomy, chemo, radiation also internalbradytherapy on 02/2018, hypothyroidism, hypertension, hyperlipidemia, arthritis, tonsillectomy, TEE without cardioversion, laparoscopic bilateral salpingooophorectomy. I called Vickie Barton for scheduled telemedicine telephonic is video not available follow-up palliative care visit. Ms.  Barton and I talked about purpose of visit and in agreement. We talked about how she has been feeling. Vickie Barton did have a fall two weeks ago which injured her leg. Vickie Barton did not seek medical attention and just remained at home. It has improved on its own. Vickie Barton endorses she cancelled her visit with Dr Grayland Ormond oncologist and repeated scans. Vickie Barton  endorses she is just very tired of the treatments, procedures including the scans. Vickie Barton  endorses sometimes scans are worse than that chemotherapy. Vickie Barton endorses that she was going to give herself some time and after January revisit to see if she wanted to repeat the scans. We talked about importance of self-care. We talked about her going to her nieces and [place]. Vickie Barton endorses that she put that on hold her niece's husband fell. She did not want her niece to have to care for the both of them. We talked about challenges with disease progression in the setting of naturally thing. We talked about her appetite which is good. We talked about medical goals. We talked about quality of life. We talked about role of palliative care and plan of care. We talked about follow-up palliative care visit in 2 months if needed or sooner said she declined. Vickie Barton in agreement,  appointments scheduled. Therapeutic listening and emotional support provided. Contact information. Questions answered to satisfaction.  Palliative Care was asked to help to continue to address goals of care.   Dr Grayland Ormond Oncology Plan:  1. Stage IIIa high-grade serous endometrial carcinoma: CT scan results from September 05, 2019 reviewed independently with increasing size of known pelvic sidewall mass likely indicating progression of disease. Subsequent PET scan on September 14, 2019 confirmed progression of disease without evidence of distant metastasis. Patient last received chemotherapy in January 2019 and agreed  to reinitiate treatment with dose reduced  carboplatin and Taxol every 3 weeks. Patient will also require onProNeulasta support.Proceed with cycle 4 of treatment today. Return to clinic in 3 weeks for further evaluation and consideration of cycle 5 if necessary. Will reimage with PET scan 1 to 2 days prior to next cycle.   CODE STATUS: DNR   PPS: 60% HOSPICE ELIGIBILITY/DIAGNOSIS: TBD  PAST MEDICAL HISTORY:  Past Medical History:  Diagnosis Date  . Arthritis   . CHF (congestive heart failure) ([place])   . Constipation   . DVT (deep venous thrombosis) ([place]) 03/2017   left leg  . Endometrial cancer ([place]) 01/28/2016   Hysterectomy, chemo + rad tx's, also internal brachytherapy on 02/2018.   [name] Hyperlipidemia   . Hypertension   . Hypothyroidism     SOCIAL HX:  Social History   Tobacco Use  . Smoking status: Former Research scientist (life sciences)  . Smokeless tobacco: Never Used  Substance Use Topics  . Alcohol use: No    ALLERGIES:  Allergies  Allergen Reactions  . Sulfa Antibiotics Rash  . Zinc Rash     PERTINENT MEDICATIONS:  Outpatient Encounter Medications as of 12/18/2019  Medication Sig  . atorvastatin (LIPITOR) 10 MG tablet Take 1 tablet (10 mg total) by mouth daily at 6 PM.  . ELIQUIS 5 MG TABS tablet TAKE 2 TABLETS BY MOUTH TWICE A DAY  . furosemide (LASIX) 20 MG tablet Take 20 mg by mouth 2 (two) times daily.  [name] levothyroxine (SYNTHROID, LEVOTHROID) 150 MCG tablet TAKE 150 MCG BY MOUTH DAILY BEFORE BREAKFAST ON EMPTY STOMACH WITH A GLASS OF WATER 30-60MINS BEFORE BREAKFAST  . metoprolol tartrate (LOPRESSOR) 25 MG tablet Take 0.5 tablets (12.5 mg total) by mouth 2 (two) times daily.  . ondansetron (ZOFRAN) 8 MG tablet Take 1 tablet (8 mg total) by mouth every 8 (eight) hours as needed for nausea or vomiting.  [name] oxyCODONE (OXY IR/ROXICODONE) 5 MG immediate release tablet Take 2 tablets (10 mg total) by mouth every 4 (four) hours as needed for severe pain.  . Oxycodone HCl 10 MG TABS Take 1 tablet (10 mg total) by mouth every 4  (four) hours as needed.   No facility-administered encounter medications on file as of 12/18/2019.    PHYSICAL EXAM:  Deferred  Monterius Rolf Z Marilee Ditommaso, NP

## 2019-12-29 ENCOUNTER — Other Ambulatory Visit: Payer: Self-pay | Admitting: *Deleted

## 2019-12-29 MED ORDER — OXYCODONE HCL 10 MG PO TABS
10.0000 mg | ORAL_TABLET | ORAL | 0 refills | Status: DC | PRN
Start: 2019-12-29 — End: 2020-01-12

## 2020-01-11 ENCOUNTER — Other Ambulatory Visit: Payer: Self-pay | Admitting: Oncology

## 2020-01-12 ENCOUNTER — Other Ambulatory Visit: Payer: Self-pay | Admitting: *Deleted

## 2020-01-12 DIAGNOSIS — M25562 Pain in left knee: Secondary | ICD-10-CM | POA: Diagnosis not present

## 2020-01-12 MED ORDER — OXYCODONE HCL 10 MG PO TABS
10.0000 mg | ORAL_TABLET | ORAL | 0 refills | Status: DC | PRN
Start: 2020-01-12 — End: 2020-01-19

## 2020-01-16 ENCOUNTER — Telehealth: Payer: Self-pay | Admitting: *Deleted

## 2020-01-16 ENCOUNTER — Encounter: Payer: Self-pay | Admitting: Emergency Medicine

## 2020-01-16 ENCOUNTER — Other Ambulatory Visit: Payer: Self-pay

## 2020-01-16 ENCOUNTER — Emergency Department: Payer: Medicare Other

## 2020-01-16 ENCOUNTER — Ambulatory Visit: Admission: RE | Admit: 2020-01-16 | Payer: Medicare Other | Source: Ambulatory Visit

## 2020-01-16 ENCOUNTER — Inpatient Hospital Stay
Admission: EM | Admit: 2020-01-16 | Discharge: 2020-01-19 | DRG: 393 | Disposition: A | Discharge status: Hospice | Payer: Medicare Other | Attending: Internal Medicine | Admitting: Internal Medicine

## 2020-01-16 ENCOUNTER — Inpatient Hospital Stay: Payer: Medicare Other | Attending: Oncology | Admitting: Oncology

## 2020-01-16 DIAGNOSIS — I10 Essential (primary) hypertension: Secondary | ICD-10-CM | POA: Diagnosis not present

## 2020-01-16 DIAGNOSIS — Z8249 Family history of ischemic heart disease and other diseases of the circulatory system: Secondary | ICD-10-CM

## 2020-01-16 DIAGNOSIS — Z86711 Personal history of pulmonary embolism: Secondary | ICD-10-CM

## 2020-01-16 DIAGNOSIS — Z87891 Personal history of nicotine dependence: Secondary | ICD-10-CM

## 2020-01-16 DIAGNOSIS — Z7901 Long term (current) use of anticoagulants: Secondary | ICD-10-CM

## 2020-01-16 DIAGNOSIS — I5042 Chronic combined systolic (congestive) and diastolic (congestive) heart failure: Secondary | ICD-10-CM | POA: Diagnosis present

## 2020-01-16 DIAGNOSIS — E871 Hypo-osmolality and hyponatremia: Secondary | ICD-10-CM | POA: Diagnosis not present

## 2020-01-16 DIAGNOSIS — Z86718 Personal history of other venous thrombosis and embolism: Secondary | ICD-10-CM

## 2020-01-16 DIAGNOSIS — R531 Weakness: Secondary | ICD-10-CM | POA: Diagnosis not present

## 2020-01-16 DIAGNOSIS — I110 Hypertensive heart disease with heart failure: Secondary | ICD-10-CM | POA: Diagnosis present

## 2020-01-16 DIAGNOSIS — R296 Repeated falls: Secondary | ICD-10-CM | POA: Diagnosis not present

## 2020-01-16 DIAGNOSIS — E039 Hypothyroidism, unspecified: Secondary | ICD-10-CM | POA: Diagnosis present

## 2020-01-16 DIAGNOSIS — M25562 Pain in left knee: Secondary | ICD-10-CM | POA: Diagnosis not present

## 2020-01-16 DIAGNOSIS — M79662 Pain in left lower leg: Secondary | ICD-10-CM | POA: Diagnosis not present

## 2020-01-16 DIAGNOSIS — M79652 Pain in left thigh: Secondary | ICD-10-CM | POA: Diagnosis not present

## 2020-01-16 DIAGNOSIS — R197 Diarrhea, unspecified: Secondary | ICD-10-CM | POA: Diagnosis present

## 2020-01-16 DIAGNOSIS — C7951 Secondary malignant neoplasm of bone: Secondary | ICD-10-CM | POA: Diagnosis not present

## 2020-01-16 DIAGNOSIS — Z79899 Other long term (current) drug therapy: Secondary | ICD-10-CM

## 2020-01-16 DIAGNOSIS — Z79890 Hormone replacement therapy (postmenopausal): Secondary | ICD-10-CM

## 2020-01-16 DIAGNOSIS — E876 Hypokalemia: Secondary | ICD-10-CM | POA: Diagnosis not present

## 2020-01-16 DIAGNOSIS — K521 Toxic gastroenteritis and colitis: Secondary | ICD-10-CM | POA: Diagnosis not present

## 2020-01-16 DIAGNOSIS — C541 Malignant neoplasm of endometrium: Secondary | ICD-10-CM | POA: Diagnosis present

## 2020-01-16 DIAGNOSIS — R52 Pain, unspecified: Secondary | ICD-10-CM

## 2020-01-16 DIAGNOSIS — Z20822 Contact with and (suspected) exposure to covid-19: Secondary | ICD-10-CM | POA: Diagnosis not present

## 2020-01-16 DIAGNOSIS — Z66 Do not resuscitate: Secondary | ICD-10-CM | POA: Diagnosis present

## 2020-01-16 DIAGNOSIS — E43 Unspecified severe protein-calorie malnutrition: Secondary | ICD-10-CM | POA: Diagnosis not present

## 2020-01-16 DIAGNOSIS — W010XXA Fall on same level from slipping, tripping and stumbling without subsequent striking against object, initial encounter: Secondary | ICD-10-CM | POA: Diagnosis present

## 2020-01-16 DIAGNOSIS — R079 Chest pain, unspecified: Secondary | ICD-10-CM | POA: Diagnosis not present

## 2020-01-16 DIAGNOSIS — E860 Dehydration: Secondary | ICD-10-CM | POA: Diagnosis present

## 2020-01-16 DIAGNOSIS — R102 Pelvic and perineal pain: Secondary | ICD-10-CM | POA: Diagnosis not present

## 2020-01-16 DIAGNOSIS — Z91048 Other nonmedicinal substance allergy status: Secondary | ICD-10-CM

## 2020-01-16 DIAGNOSIS — G893 Neoplasm related pain (acute) (chronic): Secondary | ICD-10-CM | POA: Diagnosis present

## 2020-01-16 DIAGNOSIS — Z882 Allergy status to sulfonamides status: Secondary | ICD-10-CM

## 2020-01-16 DIAGNOSIS — Z681 Body mass index (BMI) 19 or less, adult: Secondary | ICD-10-CM

## 2020-01-16 DIAGNOSIS — M25552 Pain in left hip: Secondary | ICD-10-CM

## 2020-01-16 DIAGNOSIS — T451X5A Adverse effect of antineoplastic and immunosuppressive drugs, initial encounter: Secondary | ICD-10-CM | POA: Diagnosis present

## 2020-01-16 DIAGNOSIS — E785 Hyperlipidemia, unspecified: Secondary | ICD-10-CM | POA: Diagnosis present

## 2020-01-16 DIAGNOSIS — Z90710 Acquired absence of both cervix and uterus: Secondary | ICD-10-CM

## 2020-01-16 DIAGNOSIS — Z8349 Family history of other endocrine, nutritional and metabolic diseases: Secondary | ICD-10-CM

## 2020-01-16 DIAGNOSIS — M1990 Unspecified osteoarthritis, unspecified site: Secondary | ICD-10-CM | POA: Diagnosis present

## 2020-01-16 LAB — RESPIRATORY PANEL BY RT PCR (FLU A&B, COVID)
Influenza A by PCR: NEGATIVE
Influenza B by PCR: NEGATIVE
SARS Coronavirus 2 by RT PCR: NEGATIVE

## 2020-01-16 LAB — CBC
HCT: 35 % — ABNORMAL LOW (ref 36.0–46.0)
Hemoglobin: 11.2 g/dL — ABNORMAL LOW (ref 12.0–15.0)
MCH: 29.5 pg (ref 26.0–34.0)
MCHC: 32 g/dL (ref 30.0–36.0)
MCV: 92.1 fL (ref 80.0–100.0)
Platelets: 325 10*3/uL (ref 150–400)
RBC: 3.8 MIL/uL — ABNORMAL LOW (ref 3.87–5.11)
RDW: 14.4 % (ref 11.5–15.5)
WBC: 8.1 10*3/uL (ref 4.0–10.5)
nRBC: 0 % (ref 0.0–0.2)

## 2020-01-16 LAB — BASIC METABOLIC PANEL
Anion gap: 15 (ref 5–15)
BUN: 20 mg/dL (ref 8–23)
CO2: 23 mmol/L (ref 22–32)
Calcium: 9.3 mg/dL (ref 8.9–10.3)
Chloride: 95 mmol/L — ABNORMAL LOW (ref 98–111)
Creatinine, Ser: 0.93 mg/dL (ref 0.44–1.00)
GFR, Estimated: 59 mL/min — ABNORMAL LOW (ref >60)
Glucose, Bld: 120 mg/dL — ABNORMAL HIGH (ref 70–99)
Potassium: 2.5 mmol/L — CL (ref 3.5–5.1)
Sodium: 133 mmol/L — ABNORMAL LOW (ref 135–145)

## 2020-01-16 LAB — TROPONIN I (HIGH SENSITIVITY): Troponin I (High Sensitivity): 20 ng/L — ABNORMAL HIGH (ref <18)

## 2020-01-16 LAB — MAGNESIUM: Magnesium: 2.1 mg/dL (ref 1.7–2.4)

## 2020-01-16 MED ORDER — OXYCODONE HCL 5 MG PO TABS
10.0000 mg | ORAL_TABLET | ORAL | Status: DC | PRN
  Administered 2020-01-16 – 2020-01-18 (×11): 10 mg via ORAL
  Filled 2020-01-16 (×12): qty 2

## 2020-01-16 MED ORDER — METOPROLOL TARTRATE 25 MG PO TABS
12.5000 mg | ORAL_TABLET | Freq: Two times a day (BID) | ORAL | Status: DC
  Administered 2020-01-16 – 2020-01-18 (×4): 12.5 mg via ORAL
  Filled 2020-01-16 (×4): qty 1

## 2020-01-16 MED ORDER — ONDANSETRON HCL 4 MG/2ML IJ SOLN
4.0000 mg | Freq: Four times a day (QID) | INTRAMUSCULAR | Status: DC | PRN
  Administered 2020-01-16: 4 mg via INTRAVENOUS
  Filled 2020-01-16: qty 2

## 2020-01-16 MED ORDER — ACETAMINOPHEN 325 MG PO TABS
650.0000 mg | ORAL_TABLET | Freq: Four times a day (QID) | ORAL | Status: DC | PRN
  Administered 2020-01-16 – 2020-01-18 (×4): 650 mg via ORAL
  Filled 2020-01-16 (×4): qty 2

## 2020-01-16 MED ORDER — POTASSIUM CHLORIDE CRYS ER 20 MEQ PO TBCR
40.0000 meq | EXTENDED_RELEASE_TABLET | Freq: Once | ORAL | Status: AC
  Administered 2020-01-16: 40 meq via ORAL
  Filled 2020-01-16: qty 2

## 2020-01-16 MED ORDER — POTASSIUM CHLORIDE 10 MEQ/100ML IV SOLN
10.0000 meq | INTRAVENOUS | Status: AC
  Administered 2020-01-16 (×4): 10 meq via INTRAVENOUS
  Filled 2020-01-16 (×4): qty 100

## 2020-01-16 MED ORDER — LACTATED RINGERS IV SOLN: INTRAVENOUS | Status: AC

## 2020-01-16 MED ORDER — ACETAMINOPHEN 650 MG RE SUPP: 650.0000 mg | Freq: Four times a day (QID) | RECTAL | Status: DC | PRN

## 2020-01-16 MED ORDER — APIXABAN 5 MG PO TABS
5.0000 mg | ORAL_TABLET | Freq: Two times a day (BID) | ORAL | Status: DC
  Administered 2020-01-16 – 2020-01-18 (×5): 5 mg via ORAL
  Filled 2020-01-16 (×6): qty 1

## 2020-01-16 MED ORDER — ATORVASTATIN CALCIUM 20 MG PO TABS
10.0000 mg | ORAL_TABLET | Freq: Every day | ORAL | Status: DC
  Administered 2020-01-17 – 2020-01-18 (×2): 10 mg via ORAL
  Filled 2020-01-16 (×2): qty 1

## 2020-01-16 MED ORDER — LEVOTHYROXINE SODIUM 50 MCG PO TABS
150.0000 ug | ORAL_TABLET | Freq: Every day | ORAL | Status: DC
  Administered 2020-01-17: 150 ug via ORAL
  Filled 2020-01-16: qty 3

## 2020-01-16 MED ORDER — MAGNESIUM SULFATE 2 GM/50ML IV SOLN
2.0000 g | Freq: Once | INTRAVENOUS | Status: AC
  Administered 2020-01-16: 2 g via INTRAVENOUS
  Filled 2020-01-16: qty 50

## 2020-01-16 NOTE — Telephone Encounter (Signed)
Returned phone call to patient.I spoke to her . She doesnt think she can make PET scan nor does she think she can make it into office. The stool runs  Down her legs when she stands up. She is falling d/t bad L knee not weakness at this point. Does admit that she is not drinking very much. I will ask Jerene Pitch to reschedule PET per pt request. I will let Sonia Baller NP know the above and try a virtual visit perhaps.

## 2020-01-16 NOTE — ED Notes (Signed)
Pt calling daughter to update her on plan of care.

## 2020-01-16 NOTE — ED Provider Notes (Signed)
[facility] Emergency Department Provider Note    First MD Initiated Contact with Patient 01/16/20 1504     (approximate)  I have reviewed the triage vital signs and the nursing notes.   HISTORY  Chief Complaint Fall and Leg pain (cancer to leg)    HPI Vickie Barton is a 86 y.o. female   presents to the ER for evaluation of left hip and knee pain.  Patient states the left leg is giving out on her.  Has not had any head injury.  No syncopal episodes.  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  States that several days ago started having multiple episodes of profuse watery diarrhea.  Denies any recent antibiotics.  No fevers.  Denies any chest pain or pressure.   Past Medical History:  Diagnosis Date  . Arthritis   . CHF (congestive heart failure) ([place])   . Constipation   . DVT (deep venous thrombosis) ([place]) 03/2017   left leg  . Endometrial cancer ([place]) 01/28/2016   Hysterectomy, chemo + rad tx's, also internal brachytherapy on 02/2018.   [name] Hyperlipidemia   . Hypertension   . Hypothyroidism    Family History  Problem Relation Age of Onset  . Diabetes Father   . Heart disease Father   . Hypertension Father   . Stroke Father   . Diabetes Paternal Grandmother   . Heart disease Mother   . Hypertension Mother   . Stroke Mother   . Hypertension Sister   . Thyroid disease Sister   . Hypertension Brother    Past Surgical History:  Procedure Laterality Date  . CYSTOSCOPY W/ RETROGRADES Left 01/01/2017   Procedure: CYSTOSCOPY WITH RETROGRADE PYELOGRAM;  Surgeon: Nickie Retort, MD;  Location: [facility] ORS;  Service: Urology;  Laterality: Left;  . CYSTOSCOPY W/ RETROGRADES Left 04/23/2017   Procedure: CYSTOSCOPY WITH RETROGRADE PYELOGRAM;  Surgeon: Abbie Sons, MD;  Location: [facility] OR[facility];  Service: Urology;  Laterality: Left;  . CYSTOSCOPY W/ URETERAL STENT PLACEMENT Left 04/23/2017   Procedure: CYSTOSCOPY WITH STENT  REMOVAL;  Surgeon: Abbie Sons, MD;  Location: [facility] OR[facility];  Service: Urology;  Laterality: Left;  . CYSTOSCOPY WITH STENT PLACEMENT Left 01/01/2017   Procedure: CYSTOSCOPY WITH STENT PLACEMENT;  Surgeon: Nickie Retort, MD;  Location: [facility] ORS;  Service: Urology;  Laterality: Left;  . DILATION AND CURETTAGE OF UTERUS    . HYSTEROSCOPY WITH D & C N/A 01/28/2016   Procedure: DILATATION AND CURETTAGE /HYSTEROSCOPY;  Surgeon: [name], MD;  Location: [facility] OR[facility];  Service: Gynecology;  Laterality: N/A;  . LAPAROSCOPIC BILATERAL SALPINGO OOPHERECTOMY Bilateral 02/26/2016   Procedure: LAPAROSCOPIC BILATERAL SALPINGO OOPHORECTOMY;  Surgeon: [name], MD;  Location: [facility] OR[facility];  Service: Gynecology;  Laterality: Bilateral;  . LAPAROSCOPIC HYSTERECTOMY N/A 02/26/2016   Procedure: HYSTERECTOMY TOTAL LAPAROSCOPIC;  Surgeon: [name], MD;  Location: [facility] OR[facility];  Service: Gynecology;  Laterality: N/A;  . SENTINEL NODE BIOPSY N/A 02/26/2016   Procedure: SENTINEL NODE BIOPSY;  Surgeon: [name], MD;  Location: [facility];  Service: Gynecology;  Laterality: N/A;  . TEE WITHOUT CARDIOVERSION N/A 06/01/2018   Procedure: TRANSESOPHAGEAL ECHOCARDIOGRAM (TEE);  Surgeon: [name], MD;  Location: [facility] OR[facility];  Service: Cardiovascular;  Laterality: N/A;  . TONSILLECTOMY     Patient Active Problem List   Diagnosis Date Noted  . Anorexia 08/17/2018  . Weakness generalized 07/11/2018  . Chronic pain due to neoplasm 07/11/2018  .  Pain   . Neoplasm related pain   . Palliative care encounter   . Sepsis ([place]) 05/30/2018  . History of DVT (deep vein thrombosis) 05/24/2018  . Pain of metastatic malignancy 05/20/2018  . Goals of care, counseling/discussion 01/02/2017  . CAP (community acquired pneumonia) 10/24/2016  . Endometrial cancer ([place]) 02/19/2016      Prior to Admission medications   Medication Sig Start Date End Date Taking? Authorizing Provider  atorvastatin (LIPITOR) 10 MG tablet  Take 1 tablet (10 mg total) by mouth daily at 6 PM. 06/07/18   Vaughan Basta, MD  ELIQUIS 5 MG TABS tablet TAKE 2 TABLETS BY MOUTH TWICE A DAY 01/11/20   [name], MD  furosemide (LASIX) 20 MG tablet Take 20 mg by mouth 2 (two) times daily. 09/26/18   [provider]  levothyroxine (SYNTHROID, LEVOTHROID) 150 MCG tablet TAKE 150 MCG BY MOUTH DAILY BEFORE BREAKFAST ON EMPTY STOMACH WITH A GLASS OF WATER 30-60MINS BEFORE BREAKFAST 03/19/17   [provider]  metoprolol tartrate (LOPRESSOR) 25 MG tablet Take 0.5 tablets (12.5 mg total) by mouth 2 (two) times daily. 06/07/18   Vaughan Basta, MD  ondansetron (ZOFRAN) 8 MG tablet Take 1 tablet (8 mg total) by mouth every 8 (eight) hours as needed for nausea or vomiting. 05/27/18   Borders, [name], NP  Oxycodone HCl 10 MG TABS Take 1 tablet (10 mg total) by mouth every 4 (four) hours as needed. 01/12/20   Borders, [name], NP    Allergies Sulfa antibiotics and Zinc    Social History Social History   Tobacco Use  . Smoking status: Former Research scientist (life sciences)  . Smokeless tobacco: Never Used  Vaping Use  . Vaping Use: Never used  Substance Use Topics  . Alcohol use: No  . Drug use: No    Review of Systems Patient denies headaches, rhinorrhea, blurry vision, numbness, shortness of breath, chest pain, edema, cough, abdominal pain, nausea, vomiting, diarrhea, dysuria, fevers, rashes or hallucinations unless otherwise stated above in HPI. ____________________________________________   PHYSICAL EXAM:  VITAL SIGNS: Vitals:   01/16/20 1530 01/16/20 1600  BP: (!) 174/78 (!) 170/83  Pulse: 64 (!) 59  Resp: (!) 23 (!) 27  Temp:    SpO2: 100% 99%    Constitutional: Alert and oriented.  Eyes: Conjunctivae are normal.  Head: Atraumatic. Nose: No congestion/rhinnorhea. Mouth/Throat: Mucous membranes are moist.   Neck: No stridor. Painless ROM.  Cardiovascular: Normal rate, regular rhythm. Grossly normal heart  sounds.  Good peripheral circulation. Respiratory: Normal respiratory effort.  No retractions. Lungs CTAB. Gastrointestinal: Soft and nontender. No distention. No abdominal bruits. No CVA tenderness. Genitourinary:  Musculoskeletal: No lower extremity tenderness, edema.  No joint effusions. Neurologic:  Normal speech and language. No gross focal neurologic deficits are appreciated. No facial droop Skin:  Skin is warm, dry and intact. No rash noted. Psychiatric: Mood and affect are normal. Speech and behavior are normal.  ____________________________________________   LABS (all labs ordered are listed, but only abnormal results are displayed)  Results for orders placed or performed during the hospital encounter of 01/16/20 (from the past 24 hour(s))  Basic metabolic panel     Status: Abnormal   Collection Time: 01/16/20  2:04 PM  Result Value Ref Range   Sodium 133 (L) 135 - 145 mmol/L   Potassium 2.5 (LL) 3.5 - 5.1 mmol/L   Chloride 95 (L) 98 - 111 mmol/L   CO2 23 22 - 32 mmol/L   Glucose, Bld 120 (  H) 70 - 99 mg/dL   BUN 20 8 - 23 mg/dL   Creatinine, Ser 0.93 0.44 - 1.00 mg/dL   Calcium 9.3 8.9 - 10.3 mg/dL   GFR, Estimated 59 (L) >60 mL/min   Anion gap 15 5 - 15  CBC     Status: Abnormal   Collection Time: 01/16/20  2:04 PM  Result Value Ref Range   WBC 8.1 4.0 - 10.5 K/uL   RBC 3.80 (L) 3.87 - 5.11 MIL/uL   Hemoglobin 11.2 (L) 12.0 - 15.0 g/dL   HCT 35.0 (L) 36 - 46 %   MCV 92.1 80.0 - 100.0 fL   MCH 29.5 26.0 - 34.0 pg   MCHC 32.0 30.0 - 36.0 g/dL   RDW 14.4 11.5 - 15.5 %   Platelets 325 150 - 400 K/uL   nRBC 0.0 0.0 - 0.2 %  Troponin I (High Sensitivity)     Status: Abnormal   Collection Time: 01/16/20  2:04 PM  Result Value Ref Range   Troponin I (High Sensitivity) 20 (H) <18 ng/L  Magnesium     Status: None   Collection Time: 01/16/20  2:04 PM  Result Value Ref Range   Magnesium 2.1 1.7 - 2.4 mg/dL   ____________________________________________  EKG My  review and personal interpretation at Time: 13:53   Indication: weakness  Rate: 70  Rhythm: sinus Axis: normal Other: normal intervals, no stemi ____________________________________________  RADIOLOGY  I personally reviewed all radiographic images ordered to evaluate for the above acute complaints and reviewed radiology reports and findings.  These findings were personally discussed with the patient.  Please see medical record for radiology report.  ____________________________________________   PROCEDURES  Procedure(s) performed:  Procedures    Critical Care performed: no ____________________________________________   INITIAL IMPRESSION / ASSESSMENT AND PLAN / ED COURSE  Pertinent labs & imaging results that were available during my care of the patient were reviewed by me and considered in my medical decision making (see chart for details).   DDX: dehydration, electrolyte abn, fracture, contusion, dislocation  LETHER TESCH is a 86 y.o. who presents to the ED with presentation as described above.  X-rays ordered to evaluate for any evidence of fracture dislocation showed none.  Noted to have significant hypokalemia and given her weakness and frequent falls I suspect this is contributing to it.  Will give magnesium as well as potassium replacement.  We will send stool studies for culture though I did doubt infectious process as she does not have any white count.  No abdominal pain.  Will discuss with hospitalist for admission for electrolyte replacement and further medical management.     The patient was evaluated in Emergency Department today for the symptoms described in the history of present illness. He/she was evaluated in the context of the global COVID-19 pandemic, which necessitated consideration that the patient might be at risk for infection with the SARS-CoV-2 virus that causes COVID-19. Institutional protocols and algorithms that pertain to the evaluation of patients  at risk for COVID-19 are in a state of rapid change based on information released by regulatory bodies including the CDC and federal and state organizations. These policies and algorithms were followed during the patient's care in the ED.  As part of my medical decision making, I reviewed the following data within the [facility] notes reviewed and incorporated, Labs reviewed, notes from prior ED visits and [place] Controlled Substance Database   ____________________________________________   FINAL CLINICAL IMPRESSION(S) /  ED DIAGNOSES  Final diagnoses:  Frequent falls  Acute hypokalemia  Diarrhea, unspecified type      NEW MEDICATIONS STARTED DURING THIS VISIT:  New Prescriptions   No medications on file     Note:  This document was prepared using Dragon voice recognition software and may include unintentional dictation errors.    Merlyn Lot, MD 01/16/20 1626

## 2020-01-16 NOTE — ED Notes (Signed)
Per EDP do not draw 2nd troponin

## 2020-01-16 NOTE — Telephone Encounter (Signed)
Patient called reporting that she is having severe diarrhea, weakness, vomiting, and has fallen 3 times in 3 weeks. The diarrhea started 5 days ago, vomiting once yesterday. Diarrhea comes out every time she stands up. She has not taken any medicine for it. She has an appointment @1  for a PET Scan that she is going to try to keep. She has not eaten in several days and is trying to drink but not much. Please advise

## 2020-01-16 NOTE — ED Notes (Signed)
Pt states N&V&D x at least a week. States last chemo treatment in September. States uses a walker but has fallen 3 times recently. Large bruise noted to L shin. Pt c/o L sided pain. States L foot feels "dead" but denies any R leg problems. A&O x4.

## 2020-01-16 NOTE — ED Notes (Signed)
Pt going to xray at this time. Will draw blood when pt returns.

## 2020-01-16 NOTE — ED Notes (Signed)
Date and time results received: 01/16/20 1452   Test: potassium Critical Value: 2.5  Name of Provider Notified: Dr. Cherylann Banas

## 2020-01-16 NOTE — ED Notes (Signed)
Patient's husband disclosed that Patient has taken #90 tablets of Oxycodone 10 mg in 9 days, due to excruciating pain in her leg.

## 2020-01-16 NOTE — Progress Notes (Signed)
Virtual Visit via Telephone Note  I connected with Vickie Barton on 01/16/20 at  1:00 PM EDT by telephone and verified that I am speaking with the correct person using two identifiers.  Location: Patient: Home Provider: Clinic    I discussed the limitations, risks, security and privacy concerns of performing an evaluation and management service by telephone and the availability of in person appointments. I also discussed with the patient that there may be a patient responsible charge related to this service. The patient expressed understanding and agreed to proceed.  History of Present Illness:  Vickie Barton is an 86 year old female with past medical history significant for history of DVT, sepsis, anorexia and endometrial cancer status post several lines of treatment including chemo and radiation.  She recently completed 4 cycles of carbo/Taxol on 11/23/2019.  She is followed by Dr. Grayland Ormond and was last seen by him on 11/23/2019 and plan was to proceed with a PET scan with the potential for additional chemo.  Unfortunately, she has canceled her appointments.  She is followed by palliative care in the outpatient setting with Natalia Leatherwood, NP with [facility].  She has had multiple falls with injuries to her legs.  She declined medical attention.  She did endorse concerns of additional treatments and imaging.  She is unsure if she will do additional treatments at this time.  Today, patient calls clinic to to cancel her PET scan due to diarrhea and pain.  I personally called to discuss concerns with the patient.  Patient states she developed diarrhea 3 days ago.  Every time she stands up she has a bowel movement.  Denies any exacerbating factors.  Endorse taking 90 oxycodone in 9 days d/t left knee pain and "cancer pain".  Her daughter states that she was completely "doped up" and had fallen several times d/t dizziness and unsteadiness.  Has acute left hip pain, 10/10 and has numbness in her entire  left leg.  Left knee pain is chronic and she is followed by orthopedics.  She has not taken any pain medicine since last week because she has been out.  She did get her pain medicine refilled on Monday but her daughter has held it to see if the numbness will improve in her left leg. Reports her mother is laying in the fetal position and "crying out in pain".  She is taking Tylenol 1000 mg several times per day.  She is not eating or drinking.  Her blood pressure is stable per daughter.  She is alert and oriented.  She had one episode of nausea with vomiting yesterday.  She denies chest pain, shortness of breath or altered mental status.  Observations/Objective: Review of Systems  Constitutional: Negative.  Negative for chills, fever, malaise/fatigue and weight loss.  HENT: Negative for congestion, ear pain and tinnitus.   Eyes: Negative.  Negative for blurred vision and double vision.  Respiratory: Negative.  Negative for cough, sputum production and shortness of breath.   Cardiovascular: Negative.  Negative for chest pain, palpitations and leg swelling.  Gastrointestinal: Negative.  Negative for abdominal pain, constipation, diarrhea, nausea and vomiting.  Genitourinary: Negative for dysuria, frequency and urgency.  Musculoskeletal: Positive for falls and joint pain. Negative for back pain.       Left hip pain  Skin: Negative.  Negative for rash.  Neurological: Positive for dizziness and weakness. Negative for headaches.  Endo/Heme/Allergies: Negative.  Does not bruise/bleed easily.  Psychiatric/Behavioral: Negative.  Negative for depression. The patient is not  nervous/anxious and does not have insomnia.    Assessment and Plan:  Metastatic endometrial cancer: Status post several lines of treatment.  Most recently received [place] Taxol which was completed in September 2021.  Has not had repeat imaging due to canceling appointments.  Unclear if patient is interested in additional follow-up.  Had a  scheduled PET scan for today but canceled it.  Left knee pain: Has chronic left knee pain and is seen by orthopedics.   Left hip pain: Has had several falls recently due to overtaking narcotics causing sedation, dizziness and weakness.  Left hip pain is acute and certainly needs imaging.  Given patient is very weak and unable to bear weight, I recommend emergency room evaluation to eliminate unnecessary movement, faster imaging and to get her pain stabilized.  I asked her daughter to give her one of her 10 mg oxycodones prior to leaving the house.  Disposition: Patient to present directly to the emergency room for evaluation with imaging and pain control. Patient's family in agreement with our plan.  I discussed the assessment and treatment plan with the patient. The patient was provided an opportunity to ask questions and all were answered. The patient agreed with the plan and demonstrated an understanding of the instructions.   The patient was advised to call back or seek an in-person evaluation if the symptoms worsen or if the condition fails to improve as anticipated.  I provided 30 minutes of non-face-to-face time during this encounter.   Jacquelin Hawking, NP

## 2020-01-16 NOTE — ED Triage Notes (Addendum)
Pt in w/fall the end of last week, presents w/L knee and lower leg pain. Bruising present LLE. Does have hx of endometrial and L leg cancer, last chemo trx Sept 2. Per pt, she did have a normal xray on Friday after fall, but is unable to bear weight on the leg today. Takes Oxy for pain, last dose at 1230. Also endorses period of central cp yesterday. Does take Eliquis for blood clots to LLE

## 2020-01-16 NOTE — ED Notes (Deleted)
Patient's husband disclosed that Patient has taken #90 tablets of Oxycodone 5 mg in 9 days, due to excruciating pain in her leg.

## 2020-01-16 NOTE — H&P (Addendum)
History and Physical    PLEASE NOTE THAT DRAGON DICTATION SOFTWARE WAS USED IN THE CONSTRUCTION OF THIS NOTE.   Vickie Barton TOI:[id number] DOB: Oct 19, 1930 DOA: 01/16/2020  PCP: [name], MD Patient coming from: home   I have personally briefly reviewed patient's old medical records in [place]  Chief Complaint: Generalized weakness  HPI: Vickie Barton is a 86 y.o. 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   The patient reports to 3 days of generalized weakness in the absence of any acute focal weakness or any change in sensation.  She reports that the general weakness has been associated with 2-3 ground-level mechanical falls over the last 1 to 2 days.  She describes the mechanism of fall tripping over items on the floor at home, and not feeling strong enough to be able to regain her balance before falling to the floor.  She reports that she has not hit her head as a component of any of these falls, and denies any associated loss of consciousness.  Denies any preceding presyncope or syncope.  Denies any associated chest pain, palpitations, diaphoresis, shortness of breath. she reports mild discomfort over the left hip and lateral aspect of the left knee as a consequence these recent falls.  Denies any ensuing headache, blurry vision, change in vision, or vertigo.  Patient's medical history is notable for stage IV uterine cancer with metastasis to the bone undergoing chemotherapy.  She reports within a few days after the most recent round of chemotherapy that she developed diarrhea resulting  in 3-4 daily episodes of nonbloody, loose stool over the last 4 to 5 days.  She specifically denies any associated melena, hematochezia, or abdominal discomfort. within 1 to 2 days of development episodes of loose stool, the patient reports first noting the presenting generalized weakness.  Denies any associated nausea or vomiting.  She also denies any associated subjective fever, chills, rigors, generalized myalgias, or rash.  No recent dysuria, gross hematuria, or change in urinary urgency/frequency.  No recent traveling or known COVID-19 exposures.  Denies any recent use of antibiotics or proton pump inhibitors.  In the setting of her presenting generalized weakness, the patient reports progressive difficulty and independently performing her ADLs over the last few days.     ED Course:  Vital signs in the ED were notable for the following: Temperature max 98.3; heart rate 64-76; blood pressure 141/70 6-1 70/76; respiratory rate 16-23, and oxygen saturation 98 to 100% room air.  Labs were notable for the following: CMP notable for the following: Sodium 133 compared to most recent prior sodium data point of 138 on 11/23/2019, potassium 2.5, chloride 95, bicarbonate 23 creatinine 0.93 relative to most recent prior value of 0.73 on 11/23/2019.  Serum magnesium 2.1.  CBC notable for white blood cell count of 8100.  Given the patient's report of 3-4 episodes of loose stool on a daily basis over the last several days, stool studies, including C. difficile, were ordered in the ED, with results currently pending.  Additionally, COVID-19 PCR was checked in the ED, with result currently pending.  Chest x-ray showed no evidence of acute cardiopulmonary process, including no evidence of infiltrate, edema, or effusion.  Plain films of the left tib-fib, plain films of the left femur, and plain films of the pelvis all showed no evidence of acute fracture or dislocation.  While in the ED, the following were administered:  Potassium chloride 40 mEq p.o. x1, potassium chloride 40 mEq IV every 4 hours x1, magnesium sulfate 2 g IV over 2 hours x 1, and oxycodone 10 mg p.o. x1.  Subsequently, the patient was admitted for overnight observation for further evaluation and management of presenting generalized weakness in the setting of diarrhea induced dehydration and associated hyponatremia/hypokalemia.    Review of Systems: As per HPI otherwise 10 point review of systems negative.   Past Medical History:  Diagnosis Date  . Arthritis   . CHF (congestive heart failure) ([place])   . Constipation   . DVT (deep venous thrombosis) ([place]) 03/2017   left leg  . Endometrial cancer ([place]) 01/28/2016   Hysterectomy, chemo + rad tx's, also internal brachytherapy on 02/2018.   [name] Hyperlipidemia   . Hypertension   . Hypothyroidism     Past Surgical History:  Procedure Laterality Date  . CYSTOSCOPY W/ RETROGRADES Left 01/01/2017   Procedure: CYSTOSCOPY WITH RETROGRADE PYELOGRAM;  Surgeon: Nickie Retort, MD;  Location: [facility] ORS;  Service: Urology;  Laterality: Left;  . CYSTOSCOPY W/ RETROGRADES Left 04/23/2017   Procedure: CYSTOSCOPY WITH RETROGRADE PYELOGRAM;  Surgeon: Abbie Sons, MD;  Location: [facility] ORS;  Service: Urology;  Laterality: Left;  . CYSTOSCOPY W/ URETERAL STENT PLACEMENT Left 04/23/2017   Procedure: CYSTOSCOPY WITH STENT REMOVAL;  Surgeon: Abbie Sons, MD;  Location: [facility] ORS;  Service: Urology;  Laterality: Left;  . CYSTOSCOPY WITH STENT PLACEMENT Left 01/01/2017   Procedure: CYSTOSCOPY WITH STENT PLACEMENT;  Surgeon: Nickie Retort, MD;  Location: [facility] ORS;  Service: Urology;  Laterality: Left;  . DILATION AND CURETTAGE OF UTERUS    . HYSTEROSCOPY WITH D & C N/A 01/28/2016   Procedure: DILATATION AND CURETTAGE /HYSTEROSCOPY;  Surgeon: [name], MD;  Location: [facility] ORS;  Service: Gynecology;  Laterality: N/A;  . LAPAROSCOPIC BILATERAL SALPINGO OOPHERECTOMY Bilateral 02/26/2016   Procedure:  LAPAROSCOPIC BILATERAL SALPINGO OOPHORECTOMY;  Surgeon: [name], MD;  Location: [facility] ORS;  Service: Gynecology;  Laterality: Bilateral;  . LAPAROSCOPIC HYSTERECTOMY N/A 02/26/2016   Procedure: HYSTERECTOMY TOTAL LAPAROSCOPIC;  Surgeon: [name], MD;  Location: [facility] ORS;  Service: Gynecology;  Laterality: N/A;  . SENTINEL NODE BIOPSY N/A 02/26/2016   Procedure: SENTINEL NODE BIOPSY;  Surgeon: [name], MD;  Location: [facility] ORS;  Service: Gynecology;  Laterality: N/A;  . TEE WITHOUT CARDIOVERSION N/A 06/01/2018   Procedure: TRANSESOPHAGEAL ECHOCARDIOGRAM (TEE);  Surgeon: [name], MD;  Location: [facility] ORS;  Service: Cardiovascular;  Laterality: N/A;  . TONSILLECTOMY      Social History:  reports that she has quit smoking. She has never used smokeless tobacco. She reports that she does not drink alcohol and does not use drugs.   Allergies  Allergen Reactions  . Sulfa Antibiotics Rash  . Zinc Rash    Family History  Problem Relation Age of Onset  . Diabetes Father   . Heart disease Father   . Hypertension Father   . Stroke Father   . Diabetes Paternal Grandmother   . Heart disease Mother   . Hypertension Mother   . Stroke Mother   . Hypertension Sister   . Thyroid disease Sister   . Hypertension Brother      Prior to Admission medications  Medication Sig Start Date End Date Taking? Authorizing Provider  atorvastatin (LIPITOR) 10 MG tablet Take 1 tablet (10 mg total) by mouth daily at 6 PM. 06/07/18   Vaughan Basta, MD  ELIQUIS 5 MG TABS tablet TAKE 2 TABLETS BY MOUTH TWICE A DAY 01/11/20   [name], MD  furosemide (LASIX) 20 MG tablet Take 20 mg by mouth 2 (two) times daily. 09/26/18   [provider]  levothyroxine (SYNTHROID, LEVOTHROID) 150 MCG tablet TAKE 150 MCG BY MOUTH DAILY BEFORE BREAKFAST ON EMPTY STOMACH WITH A GLASS OF WATER 30-60MINS BEFORE BREAKFAST 03/19/17   [provider]  metoprolol tartrate  (LOPRESSOR) 25 MG tablet Take 0.5 tablets (12.5 mg total) by mouth 2 (two) times daily. 06/07/18   Vaughan Basta, MD  ondansetron (ZOFRAN) 8 MG tablet Take 1 tablet (8 mg total) by mouth every 8 (eight) hours as needed for nausea or vomiting. 05/27/18   Borders, [name], NP  Oxycodone HCl 10 MG TABS Take 1 tablet (10 mg total) by mouth every 4 (four) hours as needed. 01/12/20   Borders, [name], NP     Objective    Physical Exam: Vitals:   01/16/20 1359 01/16/20 1506 01/16/20 1530 01/16/20 1600  BP:  (!) 170/76 (!) 174/78 (!) 170/83  Pulse:  66 64 (!) 59  Resp:  16 (!) 23 (!) 27  Temp:      TempSrc:      SpO2:  98% 100% 99%  Weight: 54.8 kg       General: appears to be stated age; alert, oriented Skin: warm, dry, no rash Head:  AT/[place] Eyes:  PEARL b/l, EOMI Mouth:  Oral mucosa membranes appear dry, normal dentition Neck: supple; trachea midline Heart:  RRR; did not appreciate any M/R/G Lungs: CTAB, did not appreciate any wheezes, rales, or rhonchi Abdomen: + BS; soft, ND, NT Vascular: 2+ pedal pulses b/l; 2+ radial pulses b/l Extremities: no peripheral edema, no muscle wasting Neuro: strength and sensation intact in upper and lower extremities b/l   Labs on Admission: I have personally reviewed following labs and imaging studies  CBC: Recent Labs  Lab 01/16/20 1404  WBC 8.1  HGB 11.2*  HCT 35.0*  MCV 92.1  PLT 952   Basic Metabolic Panel: Recent Labs  Lab 01/16/20 1404  NA 133*  K 2.5*  CL 95*  CO2 23  GLUCOSE 120*  BUN 20  CREATININE 0.93  CALCIUM 9.3  MG 2.1   GFR: CrCl cannot be calculated (Unknown ideal weight.). Liver Function Tests: No results for input(s): AST, ALT, ALKPHOS, BILITOT, PROT, ALBUMIN in the last 168 hours. No results for input(s): LIPASE, AMYLASE in the last 168 hours. No results for input(s): AMMONIA in the last 168 hours. Coagulation Profile: No results for input(s): INR, PROTIME in the last 168 hours. Cardiac  Enzymes: No results for input(s): CKTOTAL, CKMB, CKMBINDEX, TROPONINI in the last 168 hours. BNP (last 3 results) No results for input(s): PROBNP in the last 8760 hours. HbA1C: No results for input(s): HGBA1C in the last 72 hours. CBG: No results for input(s): GLUCAP in the last 168 hours. Lipid Profile: No results for input(s): CHOL, HDL, LDLCALC, TRIG, CHOLHDL, LDLDIRECT in the last 72 hours. Thyroid Function Tests: No results for input(s): TSH, T4TOTAL, FREET4, T3FREE, THYROIDAB in the last 72 hours. Anemia Panel: No results for input(s): VITAMINB12, FOLATE, FERRITIN, TIBC, IRON, RETICCTPCT in the last 72 hours. Urine analysis:    Component Value Date/Time   COLORURINE  YELLOW (A) 05/30/2018 1231   APPEARANCEUR CLEAR (A) 05/30/2018 1231   APPEARANCEUR Clear 04/15/2017 1442   LABSPEC 1.013 05/30/2018 1231   PHURINE 6.0 05/30/2018 1231   GLUCOSEU NEGATIVE 05/30/2018 1231   HGBUR MODERATE (A) 05/30/2018 1231   BILIRUBINUR NEGATIVE 05/30/2018 1231   BILIRUBINUR Negative 04/15/2017 [place] 05/30/2018 1231   PROTEINUR 30 (A) 05/30/2018 1231   NITRITE NEGATIVE 05/30/2018 1231   LEUKOCYTESUR NEGATIVE 05/30/2018 1231    Radiological Exams on Admission: DG Chest 2 View  Result Date: 01/16/2020 CLINICAL DATA:  Chest pain.  Recent fall. EXAM: CHEST - 2 VIEW COMPARISON:  May 30, 2018 FINDINGS: The heart size and mediastinal contours are within normal limits. Both lungs are clear. The visualized skeletal structures are unremarkable. IMPRESSION: No active cardiopulmonary disease. Electronically Signed   By: Constance Holster M.D.   On: 01/16/2020 15:17   DG Pelvis 1-2 Views  Result Date: 01/16/2020 CLINICAL DATA:  Pain status post fall EXAM: PELVIS - 1-2 VIEW COMPARISON:  CT dated September 05, 2019 FINDINGS: There is no acute displaced fracture or dislocation. There is increasing sclerosis within the left iliac bone. There is diffuse heterogeneous osteopenia of the  visualized osseous structures. An IVC filter is noted. Mild degenerative changes are noted of the hip. IMPRESSION: 1. No acute displaced fracture or dislocation. 2. Increasing sclerosis within the left iliac bone is likely related to the previously demonstrated mass within the left iliacus muscle. 3. Diffuse heterogeneous osteopenia. 4. Mild degenerative changes of the hip. Electronically Signed   By: Constance Holster M.D.   On: 01/16/2020 15:20   DG Tibia/Fibula Left  Result Date: 01/16/2020 CLINICAL DATA:  Pain status post fall EXAM: LEFT TIBIA AND FIBULA - 2 VIEW COMPARISON:  None. FINDINGS: There is no evidence of fracture or other focal bone lesions. Soft tissues are unremarkable. IMPRESSION: Negative. Electronically Signed   By: Constance Holster M.D.   On: 01/16/2020 15:20   DG FEMUR MIN 2 VIEWS LEFT  Result Date: 01/16/2020 CLINICAL DATA:  Pain status post fall EXAM: LEFT FEMUR 2 VIEWS COMPARISON:  None. FINDINGS: There is no evidence of fracture or other focal bone lesions. Soft tissues are unremarkable. IMPRESSION: Negative. Electronically Signed   By: Constance Holster M.D.   On: 01/16/2020 15:18      Assessment/Plan   Vickie Barton is a 86 y.o. 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     Principal Problem:   Generalized weakness Active Problems:   Frequent falls   Diarrhea   Acute hyponatremia   Hypokalemia   Chronic combined systolic and diastolic CHF (congestive heart failure) (HCC)   Acquired hypothyroidism    #) Generalized weakness: The patient reports to 3 days of  progressive generalized weakness in the absence of any associated acute focal neurologic deficits, resulting in progressive difficulty and independently performing her ADLs over that time.  This is suspected to be multifactorial in etiology, with contributions from dehydration given multiple daily episodes of diarrhea over the last several days resulting in dehydration and hyponatremia, in addition to the physiologic stress of her recent chemotherapy.  Awaiting results of stool cultures.  Overall, no evidence to suggest underlying  infectious process at this time although urinalysis COVID-19 PCR are currently pending.  Of note presenting chest x-ray showed no evidence of acute cardiopulmonary process, including no evidence of infiltrate.  Plan: Gentle IV fluids, as further described below.  Work-up and management of loose stools, as further described below.  Check TSH.  Physical therapy consult has been ordered for the morning.  Will follow for results of urinalysis as well as COVID-19 PCR.  Repeat CMP and CBC in the morning.  Fall precautions.      #) Multiple mechanical ground-level falls: The patient appears to have experienced 2-3 episodes of mechanical ground-level falls over the last few days that appear to correspond to the onset of her generalized weakness, as above.  His events have not been associated with a loss of consciousness.  Plain films performed in the ED this evening demonstrated no evidence of acute fracture or dislocation.  In the context of recent chemotherapy and aforementioned recent falls, will also check MMA level to assess vitamin B12 status.  Plan: Check MMA.  Fall precautions.  Physical therapy consult has been placed.  Gentle IV fluids, as below.  Work-up and management of presenting acute hyperosmolar hypovolemic hyponatremia, as below.     #) Acute hypoosmolar hypovolemic hyponatremia: Presenting serum sodium down to be 133 relative to most recent prior value of 138 on  11/23/2019.  This appears to be on the basis of dehydration prompting extra-renal salt wasting via recent episodes of diarrhea, as well as increased renal salt wasting via outpatient Lasix. resulting in the clinical appearance of hypo-voluemia.  Of note, in the context of patient's chronic combined heart failure when he received the gentle IV fluid so as to not induce acute volume overload.   Plan: Check urinalysis, random urine sodium, urine osmolality. Will also check serum osmolality to confirm suspected hyperosmolar etiology.  Check TSH.  Monitor strict I's and O's and daily weights.  Repeat BMP in the morning.  Gentle IV fluids in the form of lactated Ringer's at 75 cc/h x 12-hour duration.     #) Hypokalemia: In the setting of recent episodes of loose stool, presenting serum potassium noted to be 2.5, while serum magnesium found to be 2.1.  The patient is in the process of receiving 80 units of supplemental potassium, as further described above, following which will recheck potassium level with morning BMP.     Plan: Monitor on telemetry.  Work-up and management of loose stools, as further described below.  Repeat serum magnesium level in the morning.  Hold home Lasix overnight.  Repeat BMP in the morning.  Recheck serum magnesium level.      #) Loose stool: Patient reports 3-4 episodes of stool over the last 4 to 5 days, starting within a few days of most recent chemotherapy session.  Given the timing and nature of these episodes of loose stool, suspect that this is on the basis of a sequelae of recent chemotherapy, as opposed to representing an underlying gastrointestinal infectious process.  The patient confirms that she has not been on any recent antibiotics or proton pump inhibitors to induce translocation of gut bacteria.  Therefore, underlying C. difficile colitis appears to be less likely at this time.  However, will closely monitor for results of C. difficile PCR testing initiated in the  ED this evening.  Will refrain from antimotility agents until confirming the absence of infectious process.  Plan: Follow for results of C. difficile PCR.  Gentle IV fluids, as above.  Work-up and management of associated electrolyte abnormalities, as above.  Repeat CBC and CMP in the morning.      #) Increased creatinine: Presenting serum creatinine found to be 0.93, representing an increase from most recent value of 0.73 on 11/23/2019.  Of note, this interval increase does not meet the quantitative threshold for being considered an acute kidney injury.  However, the overall laboratory and clinical picture is 1 of prerenal azotemia on the basis of dehydration stemming from recent episodes of loose stool.   Plan: Check urinalysis as well as random urine sodium and random urine creatinine.  Gentle IV fluids, as above, with close monitoring detection of development of volume overload given the patient's history of chronic combined heart failure.  Monitor strict I's and O's and daily weights.  Repeat BMP in the morning.  Attempt avoid nephrotoxic agents.       #) Acquired hypothyroidism: On Synthroid as an outpatient.  Plan: will check TSH level, particularly in the setting of presenting loose stool as well as generalized weakness.      #) Hyperlipidemia: On atorvastatin at home.  Plan: Continue on statin.     #) Chronic combined systolic/diastolic heart failure: Most recent echocardiogram was performed in March 2020 and showed a normal left ventricular cavity size, with LVEF 30 to 35%, evidence of diastolic dysfunction, severe hypokinesis of the entire septal wall, anteroseptal wall, anterior wall, and apical segment.  Outpatient diuretic regimen consists of Lasix 20 mg IV twice daily.  She is also on Lopressor at home.  Not appear to be on an ACE, ARB, or Entresto.  There may also be an indication for initiation of SGLT2 inhibitor, even in the absence of underlying diabetes, given the  associated mortality benefit in the context of chronic systolic heart failure.  At this time, patient appears slightly hypovolemic in the context of recent loose stool.  Will provide gentle IV fluid resuscitation will closely monitoring for evidence of development of hypo-volumemia.   Plan: Monitor strict I's and O's and daily weights.  Hold overnight Lasix.  Gentle IV fluids, as above.  Repeat BMP in the morning.  Repeat serum magnesium level in the morning.  Continue home Lopressor.     #) History of DVT: Per chart review, it appears that the patient a provoked DVT in a unilateral leg in 2019.  Given underlying malignancy, she has remained on chronic anticoagulation via Eliquis since that time.  No evidence of active bleed at this time.  Plan: Continue home Eliquis.    DVT prophylaxis: Continue home Eliquis Code Status: DNR/DNI Family Communication: none Disposition Plan: Per Rounding Team Consults called: none  Admission status: Observation; med telemetry.    PLEASE NOTE THAT DRAGON DICTATION SOFTWARE WAS USED IN THE CONSTRUCTION OF THIS NOTE.   [facility] Pager [phone] From 12PM- 12AM  Otherwise, please contact night-coverage  [email or web address] Password [facility]  01/16/2020, 4:47 PM

## 2020-01-17 ENCOUNTER — Telehealth: Payer: Medicare Other | Admitting: Oncology

## 2020-01-17 ENCOUNTER — Other Ambulatory Visit: Payer: Self-pay

## 2020-01-17 ENCOUNTER — Encounter: Payer: Self-pay | Admitting: Internal Medicine

## 2020-01-17 DIAGNOSIS — Z8349 Family history of other endocrine, nutritional and metabolic diseases: Secondary | ICD-10-CM | POA: Diagnosis not present

## 2020-01-17 DIAGNOSIS — Z87891 Personal history of nicotine dependence: Secondary | ICD-10-CM | POA: Diagnosis not present

## 2020-01-17 DIAGNOSIS — Z20822 Contact with and (suspected) exposure to covid-19: Secondary | ICD-10-CM | POA: Diagnosis present

## 2020-01-17 DIAGNOSIS — Z8249 Family history of ischemic heart disease and other diseases of the circulatory system: Secondary | ICD-10-CM | POA: Diagnosis not present

## 2020-01-17 DIAGNOSIS — Z681 Body mass index (BMI) 19 or less, adult: Secondary | ICD-10-CM | POA: Diagnosis not present

## 2020-01-17 DIAGNOSIS — E871 Hypo-osmolality and hyponatremia: Secondary | ICD-10-CM | POA: Diagnosis present

## 2020-01-17 DIAGNOSIS — Z515 Encounter for palliative care: Secondary | ICD-10-CM | POA: Diagnosis not present

## 2020-01-17 DIAGNOSIS — G893 Neoplasm related pain (acute) (chronic): Secondary | ICD-10-CM | POA: Diagnosis present

## 2020-01-17 DIAGNOSIS — R52 Pain, unspecified: Secondary | ICD-10-CM | POA: Diagnosis not present

## 2020-01-17 DIAGNOSIS — K5730 Diverticulosis of large intestine without perforation or abscess without bleeding: Secondary | ICD-10-CM | POA: Diagnosis not present

## 2020-01-17 DIAGNOSIS — W010XXA Fall on same level from slipping, tripping and stumbling without subsequent striking against object, initial encounter: Secondary | ICD-10-CM | POA: Diagnosis present

## 2020-01-17 DIAGNOSIS — E876 Hypokalemia: Secondary | ICD-10-CM | POA: Diagnosis present

## 2020-01-17 DIAGNOSIS — M1712 Unilateral primary osteoarthritis, left knee: Secondary | ICD-10-CM | POA: Diagnosis not present

## 2020-01-17 DIAGNOSIS — Z90710 Acquired absence of both cervix and uterus: Secondary | ICD-10-CM | POA: Diagnosis not present

## 2020-01-17 DIAGNOSIS — R296 Repeated falls: Secondary | ICD-10-CM

## 2020-01-17 DIAGNOSIS — Z66 Do not resuscitate: Secondary | ICD-10-CM | POA: Diagnosis present

## 2020-01-17 DIAGNOSIS — K521 Toxic gastroenteritis and colitis: Secondary | ICD-10-CM | POA: Diagnosis present

## 2020-01-17 DIAGNOSIS — E860 Dehydration: Secondary | ICD-10-CM | POA: Diagnosis present

## 2020-01-17 DIAGNOSIS — I110 Hypertensive heart disease with heart failure: Secondary | ICD-10-CM | POA: Diagnosis present

## 2020-01-17 DIAGNOSIS — I5042 Chronic combined systolic (congestive) and diastolic (congestive) heart failure: Secondary | ICD-10-CM | POA: Diagnosis present

## 2020-01-17 DIAGNOSIS — M79669 Pain in unspecified lower leg: Secondary | ICD-10-CM | POA: Diagnosis not present

## 2020-01-17 DIAGNOSIS — C541 Malignant neoplasm of endometrium: Secondary | ICD-10-CM | POA: Diagnosis present

## 2020-01-17 DIAGNOSIS — M1990 Unspecified osteoarthritis, unspecified site: Secondary | ICD-10-CM | POA: Diagnosis present

## 2020-01-17 DIAGNOSIS — E785 Hyperlipidemia, unspecified: Secondary | ICD-10-CM | POA: Diagnosis present

## 2020-01-17 DIAGNOSIS — Z86711 Personal history of pulmonary embolism: Secondary | ICD-10-CM | POA: Diagnosis not present

## 2020-01-17 DIAGNOSIS — R531 Weakness: Secondary | ICD-10-CM | POA: Diagnosis not present

## 2020-01-17 DIAGNOSIS — E43 Unspecified severe protein-calorie malnutrition: Secondary | ICD-10-CM | POA: Diagnosis present

## 2020-01-17 DIAGNOSIS — Z86718 Personal history of other venous thrombosis and embolism: Secondary | ICD-10-CM | POA: Diagnosis not present

## 2020-01-17 DIAGNOSIS — C7951 Secondary malignant neoplasm of bone: Secondary | ICD-10-CM | POA: Diagnosis present

## 2020-01-17 DIAGNOSIS — E039 Hypothyroidism, unspecified: Secondary | ICD-10-CM | POA: Diagnosis present

## 2020-01-17 DIAGNOSIS — T451X5A Adverse effect of antineoplastic and immunosuppressive drugs, initial encounter: Secondary | ICD-10-CM | POA: Diagnosis present

## 2020-01-17 LAB — URINALYSIS, COMPLETE (UACMP) WITH MICROSCOPIC
Bacteria, UA: NONE SEEN
Bilirubin Urine: NEGATIVE
Glucose, UA: NEGATIVE mg/dL
Hgb urine dipstick: NEGATIVE
Ketones, ur: 20 mg/dL — AB
Leukocytes,Ua: NEGATIVE
Nitrite: NEGATIVE
Protein, ur: NEGATIVE mg/dL
Specific Gravity, Urine: 1.016 (ref 1.005–1.030)
pH: 5 (ref 5.0–8.0)

## 2020-01-17 LAB — TSH: TSH: <0.01 u[IU]/mL — ABNORMAL LOW (ref 0.350–4.500)

## 2020-01-17 LAB — COMPREHENSIVE METABOLIC PANEL
ALT: 14 U/L (ref 0–44)
AST: 36 U/L (ref 15–41)
Albumin: 3.6 g/dL (ref 3.5–5.0)
Alkaline Phosphatase: 79 U/L (ref 38–126)
Anion gap: 11 (ref 5–15)
BUN: 15 mg/dL (ref 8–23)
CO2: 24 mmol/L (ref 22–32)
Calcium: 8.9 mg/dL (ref 8.9–10.3)
Chloride: 101 mmol/L (ref 98–111)
Creatinine, Ser: 0.7 mg/dL (ref 0.44–1.00)
GFR, Estimated: >60 mL/min (ref >60)
Glucose, Bld: 96 mg/dL (ref 70–99)
Potassium: 3.5 mmol/L (ref 3.5–5.1)
Sodium: 136 mmol/L (ref 135–145)
Total Bilirubin: 0.8 mg/dL (ref 0.3–1.2)
Total Protein: 7.3 g/dL (ref 6.5–8.1)

## 2020-01-17 LAB — CBC WITH DIFFERENTIAL/PLATELET
Abs Immature Granulocytes: 0.02 10*3/uL (ref 0.00–0.07)
Basophils Absolute: 0 10*3/uL (ref 0.0–0.1)
Basophils Relative: 0 %
Eosinophils Absolute: 0.1 10*3/uL (ref 0.0–0.5)
Eosinophils Relative: 1 %
HCT: 30.9 % — ABNORMAL LOW (ref 36.0–46.0)
Hemoglobin: 9.7 g/dL — ABNORMAL LOW (ref 12.0–15.0)
Immature Granulocytes: 0 %
Lymphocytes Relative: 18 %
Lymphs Abs: 1.3 10*3/uL (ref 0.7–4.0)
MCH: 29.6 pg (ref 26.0–34.0)
MCHC: 31.4 g/dL (ref 30.0–36.0)
MCV: 94.2 fL (ref 80.0–100.0)
Monocytes Absolute: 0.6 10*3/uL (ref 0.1–1.0)
Monocytes Relative: 8 %
Neutro Abs: 5.3 10*3/uL (ref 1.7–7.7)
Neutrophils Relative %: 73 %
Platelets: 302 10*3/uL (ref 150–400)
RBC: 3.28 MIL/uL — ABNORMAL LOW (ref 3.87–5.11)
RDW: 14.4 % (ref 11.5–15.5)
WBC: 7.4 10*3/uL (ref 4.0–10.5)
nRBC: 0 % (ref 0.0–0.2)

## 2020-01-17 LAB — CREATININE, URINE, RANDOM: Creatinine, Urine: 104 mg/dL

## 2020-01-17 LAB — OSMOLALITY, URINE: Osmolality, Ur: 442 mOsm/kg (ref 300–900)

## 2020-01-17 LAB — MAGNESIUM: Magnesium: 2.7 mg/dL — ABNORMAL HIGH (ref 1.7–2.4)

## 2020-01-17 LAB — SODIUM, URINE, RANDOM: Sodium, Ur: 19 mmol/L

## 2020-01-17 MED ORDER — LEVOTHYROXINE SODIUM 50 MCG PO TABS
75.0000 ug | ORAL_TABLET | Freq: Every day | ORAL | Status: DC
  Administered 2020-01-19: 06:00:00 75 ug via ORAL
  Filled 2020-01-17 (×2): qty 1

## 2020-01-17 NOTE — ED Notes (Signed)
Pt assisted to bathroom at this time. Pt tolerated well.

## 2020-01-17 NOTE — ED Notes (Signed)
Pt given breakfast tray at this time. Pt updating family on plan of care via phone.

## 2020-01-17 NOTE — ED Notes (Signed)
PT at bedside at this time 

## 2020-01-17 NOTE — Progress Notes (Addendum)
1        [facility] [place] NAME: Vickie Barton    MR#:  [id number]  DATE OF BIRTH:  May 21, 1930  SUBJECTIVE:  CHIEF COMPLAINT:   Chief Complaint  Patient presents with  . Fall  . Leg pain - cancer to leg  tired. Reports 3 falls in 2 weeks REVIEW OF SYSTEMS:  Review of Systems  Constitutional: Positive for malaise/fatigue and weight loss. Negative for diaphoresis and fever.  HENT: Negative for ear discharge, ear pain, hearing loss, nosebleeds, sore throat and tinnitus.   Eyes: Negative for blurred vision and pain.  Respiratory: Negative for cough, hemoptysis, shortness of breath and wheezing.   Cardiovascular: Negative for chest pain, palpitations, orthopnea and leg swelling.  Gastrointestinal: Positive for diarrhea. Negative for abdominal pain, blood in stool, constipation, heartburn, nausea and vomiting.  Genitourinary: Negative for dysuria, frequency and urgency.  Musculoskeletal: Negative for back pain and myalgias.  Skin: Negative for itching and rash.  Neurological: Negative for dizziness, tingling, tremors, focal weakness, seizures, weakness and headaches.  Psychiatric/Behavioral: Negative for depression. The patient is not nervous/anxious.    DRUG ALLERGIES:   Allergies  Allergen Reactions  . Sulfa Antibiotics Rash  . Zinc Rash   VITALS:  Blood pressure (!) 132/59, pulse (!) 57, temperature 98.3 F (36.8 C), temperature source Oral, resp. rate 14, height 5\' 2"  (1.575 m), weight 54.8 kg, SpO2 97 %. PHYSICAL EXAMINATION:  Physical Exam HENT:     Head: Normocephalic and atraumatic.  Eyes:     Conjunctiva/sclera: Conjunctivae normal.     Pupils: Pupils are equal, round, and reactive to light.  Neck:     Thyroid: No thyromegaly.     Trachea: No tracheal deviation.  Cardiovascular:     Rate and Rhythm: Normal rate and regular rhythm.     Heart sounds: Normal heart sounds.  Pulmonary:     Effort: Pulmonary effort is normal. No  respiratory distress.     Breath sounds: Normal breath sounds. No wheezing.  Chest:     Chest wall: No tenderness.  Abdominal:     General: Bowel sounds are normal. There is no distension.     Palpations: Abdomen is soft.     Tenderness: There is no abdominal tenderness.  Musculoskeletal:        General: Normal range of motion.     Cervical back: Normal range of motion and neck supple.  Skin:    General: Skin is warm and dry.     Findings: Bruising and ecchymosis present. No rash.     Comments: On LLE  Neurological:     Mental Status: She is alert and oriented to person, place, and time.     Cranial Nerves: No cranial nerve deficit.    LABORATORY PANEL:  Female CBC Recent Labs  Lab 01/17/20 0329  WBC 7.4  HGB 9.7*  HCT 30.9*  PLT 302   ------------------------------------------------------------------------------------------------------------------ Chemistries  Recent Labs  Lab 01/17/20 0329  NA 136  K 3.5  CL 101  CO2 24  GLUCOSE 96  BUN 15  CREATININE 0.70  CALCIUM 8.9  MG 2.7*  AST 36  ALT 14  ALKPHOS 79  BILITOT 0.8   RADIOLOGY:  No results found. 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  Generalized  weakness Likely multifactorial -diarrhea, dehydration, hyponatremia, psychological and physiological stress from recent chemotherapy We will get PT/OT evaluation  Multiple mechanical falls No obvious acute fracture Pain control and PT, OT evaluation for now  Hyponatremia/hypokalemia Improved with IV hydration and repletion of potassium  Hypothyroidism With very low TSH of less than 0.01 suggestive of over replacement We will cut back on her Synthroid dose from 150 to 75 mcg for now.  This could explain  a lot of her signs and symptoms   Body mass index is 22.1 kg/m.      Status is: Inpatient  Remains inpatient appropriate because:Persistent severe electrolyte disturbances,   Dispo: The patient is from: Home              Anticipated d/c is to: Home              Anticipated d/c date is: 2 days              Patient currently is not medically stable to d/c. will recheck her TSH as it is severely low.  Await palliative care evaluation and improvement in her symptoms.   We will get palliative care evaluation for goals of care discussion     DVT prophylaxis:            SCDs Start: 01/16/20 1829 apixaban (ELIQUIS) tablet 5 mg     Family Communication: Discussed with patient   All the records are reviewed and case discussed with Care Management/Social Worker. Management plans discussed with the patient, nursing and they are in agreement.  CODE STATUS: DNR  TOTAL TIME TAKING CARE OF THIS PATIENT: 35 minutes.   More than 50% of the time was spent in counseling/coordination of care: YES  POSSIBLE D/C IN 1-2 DAYS, DEPENDING ON CLINICAL CONDITION.   Max Sane M.D on 01/17/2020 at 4:51 PM  [facility]   CC: Primary care physician; [name], MD  Note: This dictation was prepared with Dragon dictation along with smaller phrase technology. Any transcriptional errors that result from this process are unintentional.

## 2020-01-17 NOTE — ED Notes (Signed)
Pt given phone to update family at this time

## 2020-01-17 NOTE — ED Notes (Addendum)
Pt resting comfortably at this time.

## 2020-01-17 NOTE — Evaluation (Signed)
Physical Therapy Evaluation Patient Details Name: Vickie Barton MRN: [id number] DOB: 1931-01-09 Today's Date: 01/17/2020   History of Present Illness  Vickie Barton 89yoF comes [facility] on 10/26 c generalized weakness, precipitating seevral falls over 2 days. PMH: StgIV uterineCA c skeletal mets, hypoTSH, combined HF, DVT, DVT on eliquis.  Clinical Impression  Pt admitted with above diagnosis. Pt currently with functional limitations due to the deficits listed below (see "PT Problem List"). Upon entry, pt in bed, awake and agreeable to participate, although anxious about falling and anxious about her LLE weakness. The pt is alert and oriented x4, pleasant, conversational, and generally a good historian.  MinA for bed mobility and transfers, but pt is essentially nonambulatory indefinitely. Functional mobility assessment demonstrates increased effort/time requirements, poor tolerance, and need for physical assistance, whereas the patient performed these at a higher level of independence PTA.  PT could return to home but will need assist from family with transfers and [facility] for mobility in the home. Pt will benefit from skilled PT intervention to increase independence and safety with basic mobility in preparation for discharge to the venue listed below.       Follow Up Recommendations Supervision for mobility/OOB;Supervision/Assistance - 24 hour;Home health PT    Equipment Recommendations  Wheelchair (measurements PT);Wheelchair cushion (measurements PT)    Recommendations for Other Services       Precautions / Restrictions Precautions Precautions: Fall Precaution Comments: LLE edema, echymosis s/p fall Restrictions Weight Bearing Restrictions: No      Mobility  Bed Mobility Overal bed mobility: Needs Assistance Bed Mobility: Supine to Sit;Sit to Supine     Supine to sit: Modified independent (Device/Increase time) Sit to supine: Min assist   General bed mobility comments:  labored, but able to do most on her ownb, needs help with leg back into bed.    Transfers Overall transfer level: Needs assistance Equipment used: Rolling walker (2 wheeled) Transfers: Sit to/from Stand Sit to Stand: Min guard         General transfer comment: very anxious but willing; instructged to perform with LLE PWB -> TTWB.  Ambulation/Gait Ambulation/Gait assistance: Min assist Gait Distance (Feet): 0.5 Feet Assistive device: Rolling walker (2 wheeled) Gait Pattern/deviations: Step-to pattern     General Gait Details: weak, unable to put weight on LLE; unable to support self on RW alone for hop-to gait (very anxious)  Stairs            Wheelchair Mobility    Modified Rankin (Stroke Patients Only)       Balance Overall balance assessment: History of Falls;Needs assistance Sitting-balance support: Feet supported Sitting balance-Leahy Scale: Good     Standing balance support: Bilateral upper extremity supported;During functional activity Standing balance-Leahy Scale: Poor                               Pertinent Vitals/Pain Pain Assessment: No/denies pain    Home Living Family/patient expects to be discharged to:: Private residence Living Arrangements: Children (DTR, SIL, Granddaughter) Available Help at Discharge: Family;Available 24 hours/day Type of Home: House Home Access: Stairs to enter Entrance Stairs-Rails: None (2 steps in garage, no rail) Entrance Stairs-Number of Steps: 2 Home Layout: Two level;Able to live on main level with bedroom/bathroom Home Equipment: Gilford Rile - 2 wheels;Walker - 4 wheels;Transport chair      Prior Function Level of Independence: Independent with assistive device(s)         Comments:  Previously a Printmaker, 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 weakness and lacking of full knee extension and ankle DF. P/ROM restriction of Left knee xtension (without pain) LLE Sensation: WNL       Communication      Cognition Arousal/Alertness: Awake/alert Behavior During Therapy: WFL for tasks assessed/performed;Anxious Overall Cognitive Status: Within Functional Limits for tasks assessed                                 General Comments: some repitition in conversation      General Comments      Exercises General Exercises - Lower Extremity Long Arc Quad: AROM;Left;10 reps;Seated;Limitations Long [employer] Limitations: lacks full range   Assessment/Plan    PT Assessment Patient needs continued PT services  PT Problem List Decreased strength;Decreased range of motion;Decreased knowledge of use of DME;Decreased activity tolerance;Decreased balance;Decreased mobility       PT Treatment Interventions DME instruction;Gait training;Balance training;Stair training;Functional mobility training;Therapeutic activities;Therapeutic exercise;Patient/family education    PT Goals (Current goals can be found in the Care Plan section)  Acute Rehab PT Goals Patient Stated Goal: regain ability to walk again PT Goal Formulation: With patient Time For Goal Achievement: 01/31/20 Potential to Achieve Goals: Good    Frequency Min 2X/week   Barriers to discharge        Co-evaluation               AM-PAC PT "6 Clicks" Mobility  Outcome Measure Help needed turning from your back to your side while in a flat bed without using bedrails?: A Little Help needed moving from lying on your back to sitting on the side of a flat bed without using bedrails?: A  Little Help needed moving to and from a bed to a chair (including a wheelchair)?: A Little Help needed standing up from a chair using your arms (e.g., wheelchair or bedside chair)?: A Little Help needed to walk in hospital room?: Total Help needed climbing 3-5 steps with a railing? : Total 6 Click Score: 14    End of Session Equipment Utilized During Treatment: Gait belt Activity Tolerance: Patient tolerated treatment well;No increased pain Patient left: in bed;with call bell/phone within reach Nurse Communication: Patient requests pain meds PT Visit Diagnosis: Unsteadiness on feet (R26.81);Other abnormalities of gait and mobility (R26.89);Muscle weakness (generalized) (M62.81);Difficulty in walking, not elsewhere classified (R26.2)    Time: 4497-5300 PT Time Calculation (min) (ACUTE ONLY): 37 min   Charges:   PT Evaluation $PT Eval Moderate Complexity: 1 Mod PT Treatments $Therapeutic Exercise: 8-22 mins        12:26 PM, 01/17/20 Etta Grandchild, PT, DPT Physical Therapist - [facility]  [phone] ([place])    [place] C 01/17/2020, 12:19 PM

## 2020-01-18 ENCOUNTER — Telehealth: Payer: Self-pay | Admitting: *Deleted

## 2020-01-18 ENCOUNTER — Inpatient Hospital Stay: Payer: Medicare Other

## 2020-01-18 ENCOUNTER — Other Ambulatory Visit: Payer: Medicare Other

## 2020-01-18 DIAGNOSIS — I5042 Chronic combined systolic (congestive) and diastolic (congestive) heart failure: Secondary | ICD-10-CM | POA: Diagnosis not present

## 2020-01-18 DIAGNOSIS — E871 Hypo-osmolality and hyponatremia: Secondary | ICD-10-CM | POA: Diagnosis not present

## 2020-01-18 DIAGNOSIS — R531 Weakness: Secondary | ICD-10-CM

## 2020-01-18 DIAGNOSIS — Z515 Encounter for palliative care: Secondary | ICD-10-CM

## 2020-01-18 DIAGNOSIS — E039 Hypothyroidism, unspecified: Secondary | ICD-10-CM | POA: Diagnosis not present

## 2020-01-18 MED ORDER — METOPROLOL TARTRATE 25 MG PO TABS
25.0000 mg | ORAL_TABLET | Freq: Two times a day (BID) | ORAL | Status: DC
  Administered 2020-01-18: 21:00:00 25 mg via ORAL
  Filled 2020-01-18 (×2): qty 1

## 2020-01-18 MED ORDER — OXYCODONE HCL ER 10 MG PO T12A
10.0000 mg | EXTENDED_RELEASE_TABLET | Freq: Three times a day (TID) | ORAL | Status: DC
  Administered 2020-01-18 – 2020-01-19 (×3): 10 mg via ORAL
  Filled 2020-01-18 (×3): qty 1

## 2020-01-18 MED ORDER — BOOST / RESOURCE BREEZE PO LIQD CUSTOM: 1.0000 | Freq: Three times a day (TID) | ORAL | Status: DC

## 2020-01-18 NOTE — Consult Note (Signed)
[place]  [phone] [phone]   Name: Vickie Barton Date: 01/18/2020 MRN: [id number]  DOB: [birth year]  Patient Care Team: [name], MD as PCP - General (Family Medicine) Rockey Situ, Kathlene November, MD as PCP - Cardiology (Cardiology) [name] Jonette Eva, NP as Nurse Practitioner (Hospice and Palliative Medicine) Chika Cichowski, [name], NP as Nurse Practitioner (Hospice and Palliative Medicine) [name], MD as Consulting Physician (Oncology)    REASON FOR CONSULTATION: Vickie Barton is an 86 year old female with multiple medical problems including  history of PE/DVT on Eliquis, CM with EF of 35%, history of hydronephrosis status post stenting, and 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  Patient has received multiple lines of chemotherapy most recently completing carbo/taxol in September 2021.    Patient was admitted to the hospital 01/16/2020 with progressive weakness, falls, and leg pain.  CT of the pelvis on 01/18/2020 revealed disease progression with increased size of her pelvic mass and progression of cortical erosion of the adjacent left iliac bone.  Palliative care was consulted to help address goals.  SOCIAL HISTORY:    Patient is widowed.  She lives with her daughter.  Patient had a son who died in 04-24-2018 from cancer.  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  She later worked at an [employer].  ADVANCE DIRECTIVES:  Does not have  CODE STATUS: DNR  PAST MEDICAL HISTORY: Past Medical History:  Diagnosis Date  . Arthritis   . CHF (congestive heart failure) ([place])   . Constipation   . DVT (deep venous thrombosis) ([place]) 04/24/17   left leg  . Endometrial cancer ([place]) 01/28/2016   Hysterectomy, chemo + rad tx's, also internal brachytherapy on  02/2018.   [name] Hyperlipidemia   . Hypertension   . Hypothyroidism     PAST SURGICAL HISTORY:  Past Surgical History:  Procedure Laterality Date  . CYSTOSCOPY W/ RETROGRADES Left 01/01/2017   Procedure: CYSTOSCOPY WITH RETROGRADE PYELOGRAM;  Surgeon: Nickie Retort, MD;  Location: [facility];  Service: Urology;  Laterality: Left;  . CYSTOSCOPY W/ RETROGRADES Left 04/23/2017   Procedure: CYSTOSCOPY WITH RETROGRADE PYELOGRAM;  Surgeon: Abbie Sons, MD;  Location: [facility];  Service: Urology;  Laterality: Left;  . CYSTOSCOPY W/ URETERAL STENT PLACEMENT Left 04/23/2017   Procedure: CYSTOSCOPY WITH STENT REMOVAL;  Surgeon: Abbie Sons, MD;  Location: [facility];  Service: Urology;  Laterality: Left;  . CYSTOSCOPY WITH STENT PLACEMENT Left 01/01/2017   Procedure: CYSTOSCOPY WITH STENT PLACEMENT;  Surgeon: Nickie Retort, MD;  Location: [facility];  Service: Urology;  Laterality: Left;  . DILATION AND CURETTAGE OF UTERUS    . HYSTEROSCOPY WITH D & C N/A 01/28/2016   Procedure: DILATATION AND CURETTAGE /HYSTEROSCOPY;  Surgeon: [name], MD;  Location: [facility];  Service: Gynecology;  Laterality: N/A;  . LAPAROSCOPIC BILATERAL SALPINGO OOPHERECTOMY Bilateral 02/26/2016   Procedure: LAPAROSCOPIC BILATERAL SALPINGO OOPHORECTOMY;  Surgeon: [name], MD;  Location: [facility];  Service: Gynecology;  Laterality: Bilateral;  . LAPAROSCOPIC HYSTERECTOMY N/A 02/26/2016   Procedure: HYSTERECTOMY TOTAL LAPAROSCOPIC;  Surgeon: [name], MD;  Location: [facility];  Service: Gynecology;  Laterality: N/A;  . SENTINEL NODE BIOPSY N/A 02/26/2016   Procedure: SENTINEL NODE BIOPSY;  Surgeon: [name], MD;  Location: [facility];  Service: Gynecology;  Laterality: N/A;  . TEE WITHOUT CARDIOVERSION  N/A 06/01/2018   Procedure: TRANSESOPHAGEAL ECHOCARDIOGRAM (TEE);  Surgeon: [name], MD;  Location: [facility];  Service: Cardiovascular;  Laterality: N/A;  . TONSILLECTOMY       HEMATOLOGY/ONCOLOGY HISTORY:  Oncology History   No history exists.    ALLERGIES:  is allergic to sulfa antibiotics and zinc.  MEDICATIONS:  Current Facility-Administered Medications  Medication Dose Route Frequency Provider Last Rate Last Admin  . acetaminophen (TYLENOL) tablet 650 mg  650 mg Oral Q6H PRN Howerter, Justin B, DO   650 mg at 01/18/20 6837   Or  . acetaminophen (TYLENOL) suppository 650 mg  650 mg Rectal Q6H PRN Howerter, Justin B, DO      . apixaban (ELIQUIS) tablet 5 mg  5 mg Oral BID Howerter, Justin B, DO   5 mg at 01/18/20 0808  . atorvastatin (LIPITOR) tablet 10 mg  10 mg Oral q1800 Howerter, Justin B, DO   10 mg at 01/17/20 1605  . levothyroxine (SYNTHROID) tablet 75 mcg  75 mcg Oral Q0600 Max Sane, MD      . metoprolol tartrate (LOPRESSOR) tablet 25 mg  25 mg Oral BID Max Sane, MD      . ondansetron [facility]) injection 4 mg  4 mg Intravenous Q6H PRN Howerter, Justin B, DO   4 mg at 01/16/20 2136  . oxyCODONE (Oxy IR/ROXICODONE) immediate release tablet 10 mg  10 mg Oral Q4H PRN Howerter, Justin B, DO   10 mg at 01/18/20 1124    VITAL SIGNS: BP (!) 148/68 (BP Location: Left Arm)   Pulse 65   Temp 98 F (36.7 C) (Oral)   Resp 15   Ht 5' 1"  (1.549 m)   Wt 102 lb (46.3 kg)   SpO2 100%   BMI 19.27 kg/m  Filed Weights   01/17/20 0933 01/17/20 1200 01/18/20 0500  Weight: 120 lb 13 oz (54.8 kg) 116 lb (52.6 kg) 102 lb (46.3 kg)    Estimated body mass index is 19.27 kg/m as calculated from the following:   Height as of this encounter: 5' 1"  (1.549 m).   Weight as of this encounter: 102 lb (46.3 kg).  LABS: CBC:    Component Value Date/Time   WBC 7.4 01/17/2020 0329   HGB 9.7 (L) 01/17/2020 0329   HCT 30.9 (L) 01/17/2020 0329   PLT 302 01/17/2020 0329   MCV 94.2 01/17/2020 0329   NEUTROABS 5.3 01/17/2020 0329   LYMPHSABS 1.3 01/17/2020 0329   MONOABS 0.6 01/17/2020 0329   EOSABS 0.1 01/17/2020 0329   BASOSABS 0.0 01/17/2020 0329    Comprehensive Metabolic Panel:    Component Value Date/Time   NA 136 01/17/2020 0329   K 3.5 01/17/2020 0329   CL 101 01/17/2020 0329   CO2 24 01/17/2020 0329   BUN 15 01/17/2020 0329   CREATININE 0.70 01/17/2020 0329   GLUCOSE 96 01/17/2020 0329   CALCIUM 8.9 01/17/2020 0329   AST 36 01/17/2020 0329   ALT 14 01/17/2020 0329   ALKPHOS 79 01/17/2020 0329   BILITOT 0.8 01/17/2020 0329   PROT 7.3 01/17/2020 0329   ALBUMIN 3.6 01/17/2020 0329    RADIOGRAPHIC STUDIES: DG Chest 2 View  Result Date: 01/16/2020 CLINICAL DATA:  Chest pain.  Recent fall. EXAM: CHEST - 2 VIEW COMPARISON:  May 30, 2018 FINDINGS: The heart size and mediastinal contours are within normal limits. Both lungs are clear. The visualized skeletal structures are unremarkable. IMPRESSION: No active cardiopulmonary disease. Electronically Signed   By: Harrell Gave  Green M.D.   On: 01/16/2020 15:17   DG Pelvis 1-2 Views  Result Date: 01/16/2020 CLINICAL DATA:  Pain status post fall EXAM: PELVIS - 1-2 VIEW COMPARISON:  CT dated September 05, 2019 FINDINGS: There is no acute displaced fracture or dislocation. There is increasing sclerosis within the left iliac bone. There is diffuse heterogeneous osteopenia of the visualized osseous structures. An IVC filter is noted. Mild degenerative changes are noted of the hip. IMPRESSION: 1. No acute displaced fracture or dislocation. 2. Increasing sclerosis within the left iliac bone is likely related to the previously demonstrated mass within the left iliacus muscle. 3. Diffuse heterogeneous osteopenia. 4. Mild degenerative changes of the hip. Electronically Signed   By: Constance Holster M.D.   On: 01/16/2020 15:20   DG Tibia/Fibula Left  Result Date: 01/16/2020 CLINICAL DATA:  Pain status post fall EXAM: LEFT TIBIA AND FIBULA - 2 VIEW COMPARISON:  None. FINDINGS: There is no evidence of fracture or other focal bone lesions. Soft tissues are unremarkable. IMPRESSION: Negative.  Electronically Signed   By: Constance Holster M.D.   On: 01/16/2020 15:20   CT PELVIS WO CONTRAST  Addendum Date: 01/18/2020   ADDENDUM REPORT: 01/18/2020 10:46 ADDENDUM: The COMPARISON section should read as follows: COMPARISON:  Pelvic radiographs dated 01/16/2020. Electronically Signed   By: Zerita Boers M.D.   On: 01/18/2020 10:46   Result Date: 01/18/2020 CLINICAL DATA:  Lower leg pain, fracture suspected. Negative radiographs. EXAM: CT PELVIS WITHOUT CONTRAST TECHNIQUE: Multidetector CT imaging of the pelvis was performed following the standard protocol without intravenous contrast. COMPARISON:  None. FINDINGS: Urinary Tract:  No abnormality visualized. Bowel: There is colonic diverticulosis without evidence of diverticulitis. Vascular/Lymphatic: No pathologically enlarged lymph nodes. No significant vascular abnormality seen. Reproductive:  No mass or other significant abnormality Other: A large mass involving the left iliopsoas measures 9.0 x 5.7 cm in the axial plane compared to 8.5 x 5.2 cm in the axial plane on 09/14/2019. There is progression of cortical erosion of the adjacent left iliac bone. Musculoskeletal: Degenerative changes are seen in the spine. No acute osseous injury. The bones are diffusely demineralized. IMPRESSION: 1. No acute osseous injury. 2. Large mass involving the left iliopsoas has slightly increased in size since 09/14/2019 and is consistent with malignancy. There is progression of cortical erosion of the adjacent left iliac bone. Electronically Signed: By: Zerita Boers M.D. On: 01/18/2020 10:35   CT TIBIA FIBULA LEFT WO CONTRAST  Result Date: 01/18/2020 CLINICAL DATA:  Lower leg pain, stress fracture suspected. Negative radiographs. EXAM: CT OF THE LOWER LEFT EXTREMITY WITHOUT CONTRAST TECHNIQUE: Multidetector CT imaging of the lower left extremity was performed according to the standard protocol. COMPARISON:  Radiographs of the tibia and fibula dated 01/16/2020.  FINDINGS: Bones/Joint/Cartilage No acute osseous injury. No knee joint or ankle joint effusions. Moderate degenerative changes are seen in the knee joint. Ligaments Suboptimally assessed by CT. Muscles and Tendons Normal. Soft tissues There is soft tissue swelling and subcutaneous fat stranding overlying the anterior aspect of the tibial diaphysis, likely reflecting bruise due to trauma. IMPRESSION: 1. No acute osseous injury. Electronically Signed   By: Zerita Boers M.D.   On: 01/18/2020 10:45   DG FEMUR MIN 2 VIEWS LEFT  Result Date: 01/16/2020 CLINICAL DATA:  Pain status post fall EXAM: LEFT FEMUR 2 VIEWS COMPARISON:  None. FINDINGS: There is no evidence of fracture or other focal bone lesions. Soft tissues are unremarkable. IMPRESSION: Negative. Electronically Signed   By: Harrell Gave  Green M.D.   On: 01/16/2020 15:18    PERFORMANCE STATUS (ECOG) : 4 - Bedbound  REVIEW OF SYSTEMS: As noted above. Otherwise, a complete review of systems is negative.  PHYSICAL EXAM: General: NAD, frail appearing, thin Cardiovascular: regular rate and rhythm Pulmonary: clear ant fields Abdomen: soft, nontender, + bowel sounds GU: no suprapubic tenderness Extremities: 3+ edema left lower extremity Skin: no rashes Neurological: Weakness but otherwise nonfocal  IMPRESSION: Patient known to me from the clinic.  I met with patient to discuss goals.  Together, we reviewed her work-up to date.  Patient verbalized understanding that imaging shows progression of her cancer.  Given her declining performance status, it seems unlikely that she would have viable options for treatment.  Additionally, patient states that she is "tired of being poked and having all these tests and procedures."   I discussed with her the option of focusing more on quality of life and comfort at home.  I explained that she could be followed by hospice.  However, patient states that she plans to pursue home health at time of discharge and  would like to follow-up with Dr. Grayland Ormond in the [place]. I offered to make her an appointment at the [facility] but she declined this. She stated that she would prefer to call herself after discharge from the hospital.   Symptomatically, patient endorses leg pain but states that she is comfortable with current regimen of oxycodone.  PLAN: -Continue current scope of treatment -Home health with palliative care following -Recommend follow-up in the [place] but patient insists to be the one to schedule this appointment -Continue oxycodone 10 mg every 4 hours as needed for breakthrough pain -Daily bowel regimen -DNR  Case and plan discussed with Dr. Grayland Ormond   Time Total: 30 minutes  Visit consisted of counseling and education dealing with the complex and emotionally intense issues of symptom management and palliative care in the setting of serious and potentially life-threatening illness.Greater than 50%  of this time was spent counseling and coordinating care related to the above assessment and plan.  Signed by: Altha Harm, PhD, NP-C [phone] (Work Cell)

## 2020-01-18 NOTE — Progress Notes (Signed)
Physical Therapy Treatment Patient Details Name: Vickie Barton MRN: [id number] DOB: November 28, 1930 Today's Date: 01/18/2020    History of Present Illness Vickie Barton 89yoF [facility] c generalized weakness, precipitating seevral falls over 2 days. PMH: StgIV uterineCA c skeletal mets, hypoTSH, combined HF, DVT, DVT on eliquis.    PT Comments    Pt in bed upon entry agreeable to PT session, just finished OT. MinA to EOB, minA depednent SPT to recliner, then minA SPT recliner to EOB with RW. Pt hesitant to follow cues for instruction due to falls anxiety, prefers to move in a way that is familiar to her. Pt requires minA back into bed. Transport arrives to take patient to CT, session ended early. Will continue to follow.   Follow Up Recommendations  Supervision for mobility/OOB;Supervision/Assistance - 24 hour;Home health PT     Equipment Recommendations  Wheelchair (measurements PT);Wheelchair cushion (measurements PT)    Recommendations for Other Services       Precautions / Restrictions Precautions Precautions: Fall Precaution Comments: LLE edema, echymosis s/p fall Restrictions Weight Bearing Restrictions: No    Mobility  Bed Mobility Overal bed mobility: Needs Assistance Bed Mobility: Supine to Sit;Sit to Supine     Supine to sit: Min assist Sit to supine: Min assist   General bed mobility comments: requires assist of legs  Transfers Overall transfer level: Needs assistance Equipment used: Rolling walker (2 wheeled);1 person hand held assist Transfers: Sit to/from [facility] Sit to Stand: Min assist Stand pivot transfers: Min assist       General transfer comment: Dependent transfer to recliner, then RW back to EOB  Ambulation/Gait                 Stairs             Wheelchair Mobility    Modified Rankin (Stroke Patients Only)       Balance                                             Cognition Arousal/Alertness: Awake/alert Behavior During Therapy: WFL for tasks assessed/performed Overall Cognitive Status: Within Functional Limits for tasks assessed                                        Exercises      General Comments        Pertinent Vitals/Pain Pain Assessment: No/denies pain    Home Living                      Prior Function            PT Goals (current goals can now be found in the care plan section)      Frequency    Min 2X/week      PT Plan Current plan remains appropriate    Co-evaluation              AM-PAC PT "6 Clicks" Mobility   Outcome Measure  Help needed turning from your back to your side while in a flat bed without using bedrails?: A Little Help needed moving from lying on your back to sitting on the side of a flat bed without using bedrails?: A Little Help needed moving to  and from a bed to a chair (including a wheelchair)?: A Little Help needed standing up from a chair using your arms (e.g., wheelchair or bedside chair)?: A Little Help needed to walk in hospital room?: A Lot Help needed climbing 3-5 steps with a railing? : Total 6 Click Score: 15    End of Session Equipment Utilized During Treatment: Gait belt Activity Tolerance: Patient tolerated treatment well;No increased pain Patient left: in bed;with call bell/phone within reach Nurse Communication: Patient requests pain meds PT Visit Diagnosis: Unsteadiness on feet (R26.81);Other abnormalities of gait and mobility (R26.89);Muscle weakness (generalized) (M62.81);Difficulty in walking, not elsewhere classified (R26.2)     Time: 9323-5573 PT Time Calculation (min) (ACUTE ONLY): 17 min  Charges:  $Therapeutic Exercise: 8-22 mins                     5:42 PM, 01/18/20 Etta Grandchild, PT, DPT Physical Therapist - [facility]  [phone] ([place])     Cory Rama C 01/18/2020, 5:41 PM

## 2020-01-18 NOTE — Progress Notes (Signed)
I received a message from triage nurse the patient had called to the [facility] requesting hospice.  I called and spoke with patient by phone.  Patient states that since our previous conversation she has talked with her family and would like to pursue hospice services at home.  She inquired about eligibility for hospice IPU but I do not think that she meets the prognostic criteria at the present time.  At patient's request, I called and spoke with patient's daughter by phone.  She confirmed that family is interested in hospice services.  We will ask that the hospice liaison see patient tomorrow.  Patient states that pain has been poorly controlled at night.  Patient has required frequent dosing of oxycodone IR around-the-clock.  We have previously had patient on OxyContin, which we will restart today to hopefully improve pain control.  Plan: -Hospice at home once medically ready for discharge -TOC/hospice liaison to coordinate -Restart OxyContin 10 mg every 8 hours -Continue oxycodone IR as needed for breakthrough pain  Time Total: 30 minutes  Visit consisted of counseling and education dealing with the complex and emotionally intense issues of symptom management and palliative care in the setting of serious and potentially life-threatening illness.Greater than 50%  of this time was spent counseling and coordinating care related to the above assessment and plan.  Signed by: Altha Harm, PhD, NP-C

## 2020-01-18 NOTE — Consult Note (Signed)
Full consult note to follow. Agree with PT/OT. No further intervention at this time. Please call with questions.

## 2020-01-18 NOTE — Evaluation (Signed)
Occupational Therapy Evaluation Patient Details Name: Vickie Barton MRN: [id number] DOB: 03/01/1931 Today's Date: 01/18/2020    History of Present Illness Vickie Barton 89yoF [facility] on 10/26 c generalized weakness, precipitating seevral falls over 2 days. PMH: StgIV uterineCA c skeletal mets, hypoTSH, combined HF, DVT, DVT on eliquis.   Clinical Impression   Pt was seen for OT evaluation this date. Prior to hospital admission, pt was performing SPT to/from rollator to access her home, taking sponge baths 2/2 difficulty negotiating tub transfers, fearful of falling, and able to perform seated dressing tasks by herself. Pt lives with family who are able to assist. Currently pt demonstrates impairments as described below (See OT problem list) which functionally limit her ability to perform ADL/self-care tasks. Pt currently requires PRN Min A for LB ADL tasks and Min A for ADL transfers (SPT). Pt educated in AE/DME to improve independence and safety. Pt would benefit from skilled OT services to address noted impairments and functional limitations (see below for any additional details) in order to maximize safety and independence while minimizing falls risk and caregiver burden. Upon hospital discharge, recommend HHOT to maximize pt safety and return to functional independence during meaningful occupations of daily life. Pt very eager to remain as independent at possible.     Follow Up Recommendations  Home health OT    Equipment Recommendations  Tub/shower bench    Recommendations for Other Services       Precautions / Restrictions Precautions Precautions: Fall Precaution Comments: LLE edema, echymosis s/p fall Restrictions Weight Bearing Restrictions: No      Mobility Bed Mobility               General bed mobility comments: deferred per pt request    Transfers                 General transfer comment: deferred per pt request    Balance Overall balance  assessment: History of Falls;Needs assistance                                         ADL either performed or assessed with clinical judgement   ADL Overall ADL's : Needs assistance/impaired                                       General ADL Comments: Anticipate minimal assist for bathing, dressing, and for ADL transfers 2/2 impairments and fear of falling     Vision         Perception     Praxis      Pertinent Vitals/Pain Pain Assessment: No/denies pain (no pain at rest)     Hand Dominance Right   Extremity/Trunk Assessment Upper Extremity Assessment Upper Extremity Assessment: Overall WFL for tasks assessed   Lower Extremity Assessment Lower Extremity Assessment: Generalized weakness;LLE deficits/detail LLE Deficits / Details: moderate Left ankle effusion, heavy mid antebrachial effusion, apparent knee joint line effusion. weakness and lacking of full knee extension and ankle DF. P/ROM restriction of Left knee xtension (without pain) LLE Sensation: decreased light touch (intermittent pins and needles distal of L knee)       Communication Communication Communication: No difficulties   Cognition Arousal/Alertness: Awake/alert Behavior During Therapy: WFL for tasks assessed/performed Overall Cognitive Status: Within Functional Limits for tasks assessed  General Comments       Exercises Other Exercises Other Exercises: Pt instructed in AE/DME to improve safety, confidence, and independence with ADL tasks   Shoulder Instructions      Home Living Family/patient expects to be discharged to:: Private residence Living Arrangements: Children (DTR, SIL, Granddaughter) Available Help at Discharge: Family;Available 24 hours/day Type of Home: House Home Access: Stairs to enter [employer] of Steps: 2 Entrance Stairs-Rails: None (2 steps in garage, no rail) Home Layout: Two  level;Able to live on main level with bedroom/bathroom     Bathroom Shower/Tub: Tub/shower unit;Curtain   Bathroom Toilet: Standard     Home Equipment: Environmental consultant - 2 wheels;Walker - 4 wheels;Transport chair;Shower seat          Prior Functioning/Environment Level of Independence: Independent with assistive device(s)        Comments: Previously a Printmaker, 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 on single digit distances due to leg giving out; household AMB >6 months        OT Problem List: Decreased strength;Decreased activity tolerance;Impaired balance (sitting and/or standing);Decreased knowledge of use of DME or AE      OT Treatment/Interventions: Self-care/ADL training;Therapeutic exercise;Therapeutic activities;DME and/or AE instruction;Patient/family education;Energy conservation;Balance training    OT Goals(Current goals can be found in the care plan section) Acute Rehab OT Goals Patient Stated Goal: regain ability to walk again OT Goal Formulation: With patient Time For Goal Achievement: 02/01/20 Potential to Achieve Goals: Good ADL Goals Pt Will Perform Lower Body Dressing: with modified independence;sit to/from stand Pt Will Transfer to Toilet: with min guard assist;stand pivot transfer (LRAD for transfer) Additional ADL Goal #1: Pt will verbalize plan to implement at least 1 learned falls prevention strategy to minimize her risk.  OT Frequency: Min 2X/week   Barriers to D/C:            Co-evaluation              AM-PAC OT "6 Clicks" Daily Activity     Outcome Measure Help from another person eating meals?: None Help from another person taking care of personal grooming?: None Help from another person toileting, which includes using toliet, bedpan, or urinal?: A Little Help from another person bathing (including washing, rinsing, drying)?: A Little Help from another person to put on and taking off regular upper body clothing?: None Help from another person to put on and  taking off regular lower body clothing?: A Little 6 Click Score: 21   End of Session    Activity Tolerance: Patient tolerated treatment well;Other (comment) (fear of falling) Patient left: in bed;with call bell/phone within reach;with bed alarm set  OT Visit Diagnosis: Other abnormalities of gait and mobility (R26.89);Repeated falls (R29.6);Muscle weakness (generalized) (M62.81)                Time: 1694-5038 OT Time Calculation (min): 12 min Charges:  OT General Charges $OT Visit: 1 Visit OT Evaluation $OT Eval Moderate Complexity: 1 Mod  Jeni Salles, MPH, MS, OTR/L ascom [phone] 01/18/20, 11:04 AM

## 2020-01-18 NOTE — Telephone Encounter (Signed)
Patient called requesting to speak with Dr Grayland Ormond today, she wants hospice care. She is currently admitted to room 130

## 2020-01-18 NOTE — Progress Notes (Signed)
Per Dr Manuella Ghazi d/c telemetry orders

## 2020-01-18 NOTE — Progress Notes (Signed)
1        [place] [facility] [place] NAME: Vickie Barton    MR#:  [id number]  DATE OF BIRTH:  [birth year]  SUBJECTIVE:  CHIEF COMPLAINT:   Chief Complaint  Patient presents with  . Fall  . Leg pain - cancer to leg  Complaining of left knee pain and unable to walk much.  Not had any bowel movement while here. REVIEW OF SYSTEMS:  Review of Systems  Constitutional: Positive for malaise/fatigue and weight loss. Negative for diaphoresis and fever.  HENT: Negative for ear discharge, ear pain, hearing loss, nosebleeds, sore throat and tinnitus.   Eyes: Negative for blurred vision and pain.  Respiratory: Negative for cough, hemoptysis, shortness of breath and wheezing.   Cardiovascular: Negative for chest pain, palpitations, orthopnea and leg swelling.  Gastrointestinal: Negative for abdominal pain, blood in stool, constipation, heartburn, nausea and vomiting.  Genitourinary: Negative for dysuria, frequency and urgency.  Musculoskeletal: Positive for joint pain. Negative for back pain and myalgias.  Skin: Negative for itching and rash.  Neurological: Negative for dizziness, tingling, tremors, focal weakness, seizures, weakness and headaches.  Psychiatric/Behavioral: Negative for depression. The patient is not nervous/anxious.    DRUG ALLERGIES:   Allergies  Allergen Reactions  . Sulfa Antibiotics Rash  . Zinc Rash   VITALS:  Blood pressure (!) 148/68, pulse 65, temperature 98 F (36.7 C), temperature source Oral, resp. rate 15, height (!) 5" (0.127 m), weight 46.3 kg, SpO2 100 %. PHYSICAL EXAMINATION:  Physical Exam HENT:     Head: Normocephalic and atraumatic.  Eyes:     Conjunctiva/sclera: Conjunctivae normal.     Pupils: Pupils are equal, round, and reactive to light.  Neck:     Thyroid: No thyromegaly.     Trachea: No tracheal deviation.  Cardiovascular:     Rate and Rhythm: Normal rate and regular rhythm.     Heart sounds: Normal heart sounds.    Pulmonary:     Effort: Pulmonary effort is normal. No respiratory distress.     Breath sounds: Normal breath sounds. No wheezing.  Chest:     Chest wall: No tenderness.  Abdominal:     General: Bowel sounds are normal. There is no distension.     Palpations: Abdomen is soft.     Tenderness: There is no abdominal tenderness.  Musculoskeletal:        General: Normal range of motion.     Cervical back: Normal range of motion and neck supple.  Skin:    General: Skin is warm and dry.     Findings: Bruising and ecchymosis present. No rash.     Comments: On LLE  Neurological:     Mental Status: She is alert and oriented to person, place, and time.     Cranial Nerves: No cranial nerve deficit.    LABORATORY PANEL:  Female CBC Recent Labs  Lab 01/17/20 0329  WBC 7.4  HGB 9.7*  HCT 30.9*  PLT 302   ------------------------------------------------------------------------------------------------------------------ Chemistries  Recent Labs  Lab 01/17/20 0329  NA 136  K 3.5  CL 101  CO2 24  GLUCOSE 96  BUN 15  CREATININE 0.70  CALCIUM 8.9  MG 2.7*  AST 36  ALT 14  ALKPHOS 79  BILITOT 0.8   RADIOLOGY:  CT PELVIS WO CONTRAST  Addendum Date: 01/18/2020   ADDENDUM REPORT: 01/18/2020 10:46 ADDENDUM: The COMPARISON section should read as follows: COMPARISON:  Pelvic radiographs dated 01/16/2020. Electronically Signed   By:  Zerita Boers M.D.   On: 01/18/2020 10:46   Result Date: 01/18/2020 CLINICAL DATA:  Lower leg pain, fracture suspected. Negative radiographs. EXAM: CT PELVIS WITHOUT CONTRAST TECHNIQUE: Multidetector CT imaging of the pelvis was performed following the standard protocol without intravenous contrast. COMPARISON:  None. FINDINGS: Urinary Tract:  No abnormality visualized. Bowel: There is colonic diverticulosis without evidence of diverticulitis. Vascular/Lymphatic: No pathologically enlarged lymph nodes. No significant vascular abnormality seen. Reproductive:  No  mass or other significant abnormality Other: A large mass involving the left iliopsoas measures 9.0 x 5.7 cm in the axial plane compared to 8.5 x 5.2 cm in the axial plane on 09/14/2019. There is progression of cortical erosion of the adjacent left iliac bone. Musculoskeletal: Degenerative changes are seen in the spine. No acute osseous injury. The bones are diffusely demineralized. IMPRESSION: 1. No acute osseous injury. 2. Large mass involving the left iliopsoas has slightly increased in size since 09/14/2019 and is consistent with malignancy. There is progression of cortical erosion of the adjacent left iliac bone. Electronically Signed: By: Zerita Boers M.D. On: 01/18/2020 10:35   CT TIBIA FIBULA LEFT WO CONTRAST  Result Date: 01/18/2020 CLINICAL DATA:  Lower leg pain, stress fracture suspected. Negative radiographs. EXAM: CT OF THE LOWER LEFT EXTREMITY WITHOUT CONTRAST TECHNIQUE: Multidetector CT imaging of the lower left extremity was performed according to the standard protocol. COMPARISON:  Radiographs of the tibia and fibula dated 01/16/2020. FINDINGS: Bones/Joint/Cartilage No acute osseous injury. No knee joint or ankle joint effusions. Moderate degenerative changes are seen in the knee joint. Ligaments Suboptimally assessed by CT. Muscles and Tendons Normal. Soft tissues There is soft tissue swelling and subcutaneous fat stranding overlying the anterior aspect of the tibial diaphysis, likely reflecting bruise due to trauma. IMPRESSION: 1. No acute osseous injury. Electronically Signed   By: Zerita Boers M.D.   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  Generalized weakness Likely multifactorial -diarrhea, dehydration,  hyponatremia, psychological and physiological stress from recent chemotherapy PT/OT evaluation recommends home health  Multiple mechanical falls No obvious acute fracture Pain control and PT, OT -recommends home health  Left knee and hip pain X-rays are negative.  Will get a CT for further evaluation Possible progression of her malignancy in the bones Orthopedic consult  Hyponatremia/hypokalemia Improved with IV hydration and repletion of potassium  Hypothyroidism With very low TSH of less than 0.01 suggestive of over replacement - cut back on her Synthroid dose from 150 to 75 mcg for now.  This could explain a lot of her signs and symptoms   Status is: Inpatient  Remains inpatient appropriate because:Persistent severe electrolyte disturbances,   Dispo: The patient is from: Home              Anticipated d/c is to: Home              Anticipated d/c date is: 1 day              Patient currently is not medically stable to d/c.  Await palliative care and orthopedic evaluation   We will get palliative care evaluation for goals of care discussion     DVT prophylaxis:            SCDs Start: 01/16/20 1829 apixaban (ELIQUIS)  tablet 5 mg     Family Communication: Discussed with patient   All the records are reviewed and case discussed with Care Management/Social Worker. Management plans discussed with the patient, nursing and they are in agreement.  CODE STATUS: DNR  TOTAL TIME TAKING CARE OF THIS PATIENT: 35 minutes.   More than 50% of the time was spent in counseling/coordination of care: YES  POSSIBLE D/C IN 1-2 DAYS, DEPENDING ON CLINICAL CONDITION.   Max Sane M.D on 01/18/2020 at 12:48 PM  [facility]   CC: Primary care physician; [name], MD  Note: This dictation was prepared with Dragon dictation along with smaller phrase technology. Any transcriptional errors that result from this process are unintentional.

## 2020-01-18 NOTE — Telephone Encounter (Signed)
See separate note from Sharion Dove, NP Palliative Care

## 2020-01-18 NOTE — Progress Notes (Signed)
Initial Nutrition Assessment  DOCUMENTATION CODES:   Severe malnutrition in context of chronic illness  INTERVENTION:  Provide Boost Breeze po TID, each supplement provides 250 kcal and 9 grams of protein.  Provide Magic cup TID with meals, each supplement provides 290 kcal and 9 grams of protein. Patient prefers chocolate.  NUTRITION DIAGNOSIS:   Severe Malnutrition related to chronic illness (stage IV high-grade serous endometrial cancer, CHF) as evidenced by severe fat depletion, severe muscle depletion.  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   Met with patient at bedside. She reports her appetite has been decreased related to nausea but is improving some today. She reports she ate fairly well at breakfast and finished about 50% of her lunch. She has tried Ensure and Boost and reports she does not like oral nutrition supplements. She is interested in trying [employer] and chocolate Magic Cup to see if she likes these.   Patient reports she has lost about 20 lbs recently. Per chart patient was 54.8 kg on 11/23/2019. She is now documented to be 46.3 kg (102 lbs). That is a weight loss of 8.5 kg (15.5% body weight) over almost 2 months, which is significant for time frame if weights are accurate.  Medications reviewed and include: levothyroxine.  Labs reviewed: Magnesium 2.7.  Noted in chart plan is for patient to be followed by hospice services at home.  NUTRITION - FOCUSED PHYSICAL EXAM:    Most Recent Value   Orbital Region Severe depletion  Upper Arm Region Severe depletion  Thoracic and Lumbar Region Moderate depletion  Buccal Region Severe depletion  Temple Region Severe depletion  Clavicle Bone Region Severe depletion  Clavicle and Acromion Bone Region Severe depletion  Scapular Bone Region Moderate depletion  Dorsal Hand Severe depletion  Patellar Region Unable to assess  Anterior Thigh Region Unable to assess  Posterior Calf Region Severe depletion  Edema (RD Assessment) Mild  Hair Reviewed  Eyes Reviewed  Mouth Reviewed  Skin Reviewed  Nails Reviewed     Diet Order:   Diet Order            Diet regular Room service appropriate? Yes; Fluid consistency: Thin  Diet effective now                EDUCATION NEEDS:   No education needs have been identified at this time  Skin:  Skin Assessment: Reviewed RN Assessment  Last BM:  01/16/2020 per chart  Height:   Ht Readings from Last 1 Encounters:  01/18/20 5' 1" (1.549 m)   Weight:   Wt Readings from Last 1 Encounters:  01/18/20 46.3 kg   BMI:  Body mass index is 19.27 kg/m.  Estimated Nutritional Needs:   Kcal:  1400-1600  Protein:  70-80 grams  Fluid:  1.5 L/day  Jacklynn Barnacle, MS, RD, LDN Pager number available on Amion

## 2020-01-18 NOTE — Progress Notes (Signed)
[facility] Liaison Note:  New referral for [employer] services at home received from Palliative NP [place]. Writer to follow up with Patient and family in the morning. [place] notified. Thank you Flo Shanks BSN, RN, [place] [phone]

## 2020-01-19 DIAGNOSIS — R531 Weakness: Secondary | ICD-10-CM | POA: Diagnosis not present

## 2020-01-19 DIAGNOSIS — E43 Unspecified severe protein-calorie malnutrition: Secondary | ICD-10-CM | POA: Insufficient documentation

## 2020-01-19 DIAGNOSIS — Z515 Encounter for palliative care: Secondary | ICD-10-CM | POA: Diagnosis not present

## 2020-01-19 DIAGNOSIS — R296 Repeated falls: Secondary | ICD-10-CM | POA: Diagnosis not present

## 2020-01-19 DIAGNOSIS — R52 Pain, unspecified: Secondary | ICD-10-CM

## 2020-01-19 DIAGNOSIS — E876 Hypokalemia: Secondary | ICD-10-CM | POA: Diagnosis not present

## 2020-01-19 LAB — CBC
HCT: 29.8 % — ABNORMAL LOW (ref 36.0–46.0)
Hemoglobin: 9.8 g/dL — ABNORMAL LOW (ref 12.0–15.0)
MCH: 30.2 pg (ref 26.0–34.0)
MCHC: 32.9 g/dL (ref 30.0–36.0)
MCV: 91.7 fL (ref 80.0–100.0)
Platelets: 250 10*3/uL (ref 150–400)
RBC: 3.25 MIL/uL — ABNORMAL LOW (ref 3.87–5.11)
RDW: 14.3 % (ref 11.5–15.5)
WBC: 9.4 10*3/uL (ref 4.0–10.5)
nRBC: 0 % (ref 0.0–0.2)

## 2020-01-19 LAB — BASIC METABOLIC PANEL
Anion gap: 11 (ref 5–15)
BUN: 10 mg/dL (ref 8–23)
CO2: 23 mmol/L (ref 22–32)
Calcium: 8.6 mg/dL — ABNORMAL LOW (ref 8.9–10.3)
Chloride: 99 mmol/L (ref 98–111)
Creatinine, Ser: 0.54 mg/dL (ref 0.44–1.00)
GFR, Estimated: >60 mL/min (ref >60)
Glucose, Bld: 105 mg/dL — ABNORMAL HIGH (ref 70–99)
Potassium: 4 mmol/L (ref 3.5–5.1)
Sodium: 133 mmol/L — ABNORMAL LOW (ref 135–145)

## 2020-01-19 LAB — TSH: TSH: <0.01 u[IU]/mL — ABNORMAL LOW (ref 0.350–4.500)

## 2020-01-19 MED ORDER — MORPHINE SULFATE ER 15 MG PO TBCR: 15.0000 mg | EXTENDED_RELEASE_TABLET | Freq: Three times a day (TID) | ORAL | Status: DC

## 2020-01-19 MED ORDER — APIXABAN 5 MG PO TABS
5.0000 mg | ORAL_TABLET | Freq: Two times a day (BID) | ORAL | 0 refills | Status: DC
Start: 2020-01-19 — End: 2020-01-19

## 2020-01-19 MED ORDER — OXYCODONE HCL ER 10 MG PO T12A: 10.0000 mg | EXTENDED_RELEASE_TABLET | Freq: Three times a day (TID) | ORAL | 0 refills | Status: DC

## 2020-01-19 MED ORDER — OXYCODONE HCL 10 MG PO TABS: 10.0000 mg | ORAL_TABLET | Freq: Three times a day (TID) | ORAL | 0 refills | Status: AC | PRN

## 2020-01-19 MED ORDER — MORPHINE SULFATE ER 15 MG PO TBCR: 15.0000 mg | EXTENDED_RELEASE_TABLET | Freq: Three times a day (TID) | ORAL | 0 refills | Status: DC

## 2020-01-19 MED ORDER — LEVOTHYROXINE SODIUM 75 MCG PO TABS: 75.0000 ug | ORAL_TABLET | Freq: Every day | ORAL | 0 refills | Status: AC

## 2020-01-19 NOTE — Progress Notes (Signed)
Received MD order to discharge patient to home with Hospice, reviewed discharge instructions, prescriptions, home meds and follow up appointments with patient and patient verbalized understanding

## 2020-01-19 NOTE — Discharge Summary (Signed)
[place] [place] NAME: Vickie Barton    MR#:  [id number]  DATE OF BIRTH:  October 08, 1930  DATE OF ADMISSION:  01/16/2020   ADMITTING PHYSICIAN: Max Sane, MD  DATE OF DISCHARGE: 01/19/2020  1:39 PM  PRIMARY CARE PHYSICIAN: [name], MD   ADMISSION DIAGNOSIS:  Acute hypokalemia [E87.6] Pain [R52] Generalized weakness [R53.1] Frequent falls [R29.6] Diarrhea, unspecified type [R19.7] Recurrent falls [R29.6] DISCHARGE DIAGNOSIS:  Principal Problem:   Generalized weakness Active Problems:   Frequent falls   Diarrhea   Acute hyponatremia   Acute hypokalemia   Chronic combined systolic and diastolic CHF (congestive heart failure) (HCC)   Acquired hypothyroidism   Recurrent falls   Protein-calorie malnutrition, severe  SECONDARY DIAGNOSIS:   Past Medical History:  Diagnosis Date  . Arthritis   . CHF (congestive heart failure) ([place])   . Constipation   . DVT (deep venous thrombosis) ([place]) 03/2017   left leg  . Endometrial cancer ([place]) 01/28/2016   Hysterectomy, chemo + rad tx's, also internal brachytherapy on 02/2018.   [name] Hyperlipidemia   . Hypertension   . 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  Generalized weakness Likely multifactorial -diarrhea, dehydration, hyponatremia, psychological and physiological stress from recent chemotherapy  Multiple mechanical falls No obvious acute fracture  Left knee and hip pain Likely due to progression of her malignancy in the bones  Hyponatremia/hypokalemia  Hypothyroidism With very low TSH of less than 0.01 suggestive of over replacement - cut back on her Synthroid dose from 150 to 75 mcg for now.   Patient and  family decided hospice at home.   DISCHARGE CONDITIONS:  stable CONSULTS OBTAINED:   DRUG ALLERGIES:   Allergies  Allergen Reactions  . Sulfa Antibiotics Rash  . Zinc Rash   DISCHARGE MEDICATIONS:   Allergies as of 01/19/2020      Reactions   Sulfa Antibiotics Rash   Zinc Rash      Medication List    STOP taking these medications   atorvastatin 10 MG tablet Commonly known as: LIPITOR   Eliquis 5 MG Tabs tablet Generic drug: apixaban   furosemide 20 MG tablet Commonly known as: LASIX   metoprolol tartrate 25 MG tablet Commonly known as: LOPRESSOR   ondansetron 8 MG tablet Commonly known as: ZOFRAN     TAKE these medications   diclofenac Sodium 1 % Gel Commonly known as: VOLTAREN Apply 2 g topically 4 (four) times daily.   levothyroxine 75 MCG tablet Commonly known as: SYNTHROID Take 1 tablet (75 mcg total) by mouth daily at 6 (six) AM. Start taking on: January 20, 2020 What changed:   medication strength  how much to take  when to take this   morphine 15 MG 12 hr tablet Commonly known as: MS CONTIN Take 1 tablet (15 mg total) by mouth every 8 (eight) hours.   Oxycodone HCl 10 MG Tabs Take 1 tablet (10 mg total) by mouth every 8 (eight) hours as needed for up to 3 days. What changed: when to take this      DISCHARGE INSTRUCTIONS:   DIET:  Regular diet DISCHARGE CONDITION:  Fair ACTIVITY:  Activity as tolerated OXYGEN:  Home Oxygen: No.  Oxygen Delivery: room air DISCHARGE LOCATION:  Home with Hospice  If you experience worsening of your admission symptoms, develop shortness of breath, life threatening emergency, suicidal or homicidal thoughts you must seek medical attention immediately by calling 911 or calling your MD immediately  if symptoms less severe.  You Must read complete instructions/literature along with all the possible adverse reactions/side effects for all the Medicines you take and that have been prescribed to you. Take  any new Medicines after you have completely understood and accpet all the possible adverse reactions/side effects.   Please note  You were cared for by a hospitalist during your hospital stay. If you have any questions about your discharge medications or the care you received while you were in the hospital after you are discharged, you can call the unit and asked to speak with the hospitalist on call if the hospitalist that took care of you is not available. Once you are discharged, your primary care physician will handle any further medical issues. Please note that NO REFILLS for any discharge medications will be authorized once you are discharged, as it is imperative that you return to your primary care physician (or establish a relationship with a primary care physician if you do not have one) for your aftercare needs so that they can reassess your need for medications and monitor your lab values.    On the day of Discharge:  VITAL SIGNS:  Blood pressure 129/60, pulse 81, temperature 99.4 F (37.4 C), temperature source Oral, resp. rate 16, height 5\' 1"  (1.549 m), weight 46.3 kg, SpO2 98 %. PHYSICAL EXAMINATION:  GENERAL:  86 y.o.-year-old patient lying in the bed with no acute distress.  EYES: Pupils equal, round, reactive to light and accommodation. No scleral icterus. Extraocular muscles intact.  HEENT: Head atraumatic, normocephalic. Oropharynx and nasopharynx clear.  NECK:  Supple, no jugular venous distention. No thyroid enlargement, no tenderness.  LUNGS: Normal breath sounds bilaterally, no wheezing, rales,rhonchi or crepitation. No use of accessory muscles of respiration.  CARDIOVASCULAR: S1, S2 normal. No murmurs, rubs, or gallops.  ABDOMEN: Soft, non-tender, non-distended. Bowel sounds present. No organomegaly or mass.  EXTREMITIES: No pedal edema, cyanosis, or clubbing.  NEUROLOGIC: Cranial nerves II through XII are intact. Muscle strength 5/5 in all extremities. Sensation intact.  Gait not checked.  PSYCHIATRIC: The patient is alert and oriented x 3.  SKIN: No obvious rash, lesion, or ulcer.  DATA REVIEW:   CBC Recent Labs  Lab 01/19/20 0613  WBC 9.4  HGB 9.8*  HCT 29.8*  PLT 250    Chemistries  Recent Labs  Lab 01/17/20 0329 01/17/20 0329 01/19/20 0613  NA 136   < > 133*  K 3.5   < > 4.0  CL 101   < > 99  CO2 24   < > 23  GLUCOSE 96   < > 105*  BUN 15   < > 10  CREATININE 0.70   < > 0.54  CALCIUM 8.9   < > 8.6*  MG 2.7*  --   --   AST 36  --   --   ALT 14  --   --   ALKPHOS 79  --   --   BILITOT 0.8  --   --    < > = values in this interval not displayed.    Management plans discussed with the patient, family and they are in agreement.  CODE STATUS: DNR   TOTAL TIME TAKING CARE OF THIS PATIENT: 45 minutes.    Max Sane M.D on 01/19/2020  at 1:49 PM  [facility]   CC: Primary care physician; [name], MD   Note: This dictation was prepared with Dragon dictation along with smaller phrase technology. Any transcriptional errors that result from this process are unintentional.

## 2020-01-19 NOTE — Progress Notes (Signed)
[facility] Liaison note:  Visit made to new referral for [facility] hospice services at home. Writer provided education regarding hospice services, philosophy and team approach to care to patient and via telephone to her daughter Vivien Rota. Questions answered. Hospice contact numbers and information given to patient. Plan is for discharge home via car today. Hospital care team updated. Signed out of facility DNR in place to accompany patient home. Referral updated to discharge. Thank you for the opportunity to be involved in the care of this patient and her family. Flo Shanks BSN, RN, [place] [phone]

## 2020-01-19 NOTE — Care Management Important Message (Signed)
Important Message  Patient Details  Name: Vickie Barton MRN: [id number] Date of Birth: [birth year]   Medicare Important Message Given:  Yes     [name] [name] 01/19/2020, 11:33 AM

## 2020-01-19 NOTE — Discharge Instructions (Signed)
Hospice Hospice is a service that is designed to provide people who are terminally ill and their families with medical, spiritual, and psychological support. Its aim is to improve your quality of life by keeping you as comfortable as possible in the final stages of life. Who will be my providers when I begin hospice care? Hospice teams often include:  A nurse.  A doctor. The hospice doctor will be available for your care, but you can include your regular doctor or nurse practitioner.  A social worker.  A counselor.  A religious leader (such as a chaplain).  A dietitian.  Therapists.  Trained volunteers who can help with care. What services does hospice provide? Hospice services can vary depending on the center or organization. Generally, they include:  Ways to keep you comfortable, such as: ? Providing care in your home or in a home-like setting. ? Working with your family and friends to help meet your needs. ? Allowing you to enjoy the support of loved ones by receiving much of your basic care from family and friends.  Pain relief and symptom management. The staff will supply all necessary medicines and equipment so that you can stay comfortable and alert enough to enjoy the company of your friends and family.  Visits or care from a nurse and doctor. This may include 24-hour on-call services.  Companionship when you are alone.  Allowing you and your family to rest. Hospice staff may do light housekeeping, prepare meals, and run errands.  Counseling. They will make sure your emotional, spiritual, and social needs are being met, as well as those needs of your family members.  Spiritual care. This will be individualized to meet your needs and your family's needs. It may involve: ? Helping you and your family understand the dying process. ? Helping you say goodbye to your family and friends. ? Performing a specific religious ceremony or ritual.  Massage.  Nutrition  therapy.  Physical and occupational therapy.  Short-term inpatient care, if something cannot be managed in the home.  Art or music therapy.  Bereavement support for grieving family members. When should hospice care begin? Most people who use hospice are believed to have less than 6 months to live.  Your family and health care providers can help you decide when hospice services should begin.  If you live longer than 6 months but your condition does not improve, your doctor may be able to approve you for continued hospice care.  If your condition improves, you may discontinue the program. What should I consider before selecting a program? Most hospice programs are run by nonprofit, independent organizations. Some are affiliated with hospitals, nursing homes, or home health care agencies. Hospice programs can take place in your home or at a hospice center, hospital, or skilled nursing facility. When choosing a hospice program, ask the following questions:  What services are available to me?  What services will be offered to my loved ones?  How involved will my loved ones be?  How involved will my health care provider be?  Who makes up the hospice care team? How are they trained or screened?  How will my pain and symptoms be managed?  If my circumstances change, can the services be provided in a different setting, such as my home or in the hospital?  Is the program reviewed and licensed by the state or certified in some other way?  What does it cost? Is it covered by insurance?  If I choose a hospice   center or nursing home, where is the hospice center located? Is it convenient for family and friends?  If I choose a hospice center or nursing home, can my family and friends visit any time?  Will you provide emotional and spiritual support?  Who can my family call with questions? Where can I learn more about hospice? You can learn about existing hospice programs in your area  from your health care providers. You can also read more about hospice online. The websites of the following organizations have helpful information:  [facility] and Palliative Care Organization [facility]): http://www.brown-buchanan.com/  National Association for [facility]): http://massey-hart.com/  Hospice Foundation of [place] ([place]): www.hospicefoundation.org  American Cancer Society (ACS): www.cancer.org  Hospice Net: www.hospicenet.org  Visiting Nurse Associations of [place] (VNAA): www.vnaa.org You may also find more information by contacting the following agencies:  A local agency on aging.  Your local [employer] chapter.  Your state's department of health or social services. Summary  Hospice is a service that is designed to provide people who are terminally ill and their families with medical, spiritual, and psychological support.  Hospice aims to improve your quality of life by keeping you as comfortable as possible in the final stages of life.  Hospice teams often include a doctor, nurse, social worker, counselor, religious leader,dietitian, therapists, and volunteers.  Hospice care generally includes medicine for symptom management, visits from doctors and nurses, physical and occupational therapy, nutrition counseling, spiritual and emotional counseling, caregiver support, and bereavement support for grieving family members.  Hospice programs can take place in your home or at a hospice center, hospital, or skilled nursing facility. This information is not intended to replace advice given to you by your health care provider. Make sure you discuss any questions you have with your health care provider. Document Revised: 11/30/2018 Document Reviewed: 03/31/2016 Elsevier Patient Education  [place].

## 2020-01-19 NOTE — Progress Notes (Signed)
[place]  [phone] [phone]   Name: MICKEY HEBEL Date: 01/19/2020 MRN: [id number]  DOB: 1930-10-29  Patient Care Team: [name], MD as PCP - General (Family Medicine) Rockey Situ, Kathlene November, MD as PCP - Cardiology (Cardiology) [name] Jonette Eva, NP as Nurse Practitioner (Hospice and Palliative Medicine) Bryor Rami, [name], NP as Nurse Practitioner (Hospice and Palliative Medicine) [name], MD as Consulting Physician (Oncology)    REASON FOR CONSULTATION: Vickie Barton is a 86 y.o. 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  Patient has received multiple lines of chemotherapy most recently completing carbo/taxol in September 2021.  Patient was admitted to the hospital 01/16/2020 with progressive weakness, falls, and leg pain.  CT of the pelvis on 01/18/2020 revealed disease progression with increased size of her pelvic mass and progression of cortical erosion of the adjacent left iliac bone.  Palliative care was consulted to help address goals..    CODE STATUS: DNR  PAST MEDICAL HISTORY: Past Medical History:  Diagnosis Date  . Arthritis   . CHF (congestive heart failure) ([place])   . Constipation   . DVT (deep venous thrombosis) ([place]) 03/2017   left leg  . Endometrial cancer ([place]) 01/28/2016   Hysterectomy, chemo + rad tx's, also internal brachytherapy on 02/2018.   [name] Hyperlipidemia   . Hypertension   . Hypothyroidism     PAST SURGICAL HISTORY:  Past Surgical History:  Procedure Laterality Date  . CYSTOSCOPY W/ RETROGRADES Left 01/01/2017   Procedure: CYSTOSCOPY WITH RETROGRADE PYELOGRAM;  Surgeon: Nickie Retort, MD;   Location: [facility];  Service: Urology;  Laterality: Left;  . CYSTOSCOPY W/ RETROGRADES Left 04/23/2017   Procedure: CYSTOSCOPY WITH RETROGRADE PYELOGRAM;  Surgeon: Abbie Sons, MD;  Location: [facility];  Service: Urology;  Laterality: Left;  . CYSTOSCOPY W/ URETERAL STENT PLACEMENT Left 04/23/2017   Procedure: CYSTOSCOPY WITH STENT REMOVAL;  Surgeon: Abbie Sons, MD;  Location: [facility];  Service: Urology;  Laterality: Left;  . CYSTOSCOPY WITH STENT PLACEMENT Left 01/01/2017   Procedure: CYSTOSCOPY WITH STENT PLACEMENT;  Surgeon: Nickie Retort, MD;  Location: [facility];  Service: Urology;  Laterality: Left;  . DILATION AND CURETTAGE OF UTERUS    . HYSTEROSCOPY WITH D & C N/A 01/28/2016   Procedure: DILATATION AND CURETTAGE /HYSTEROSCOPY;  Surgeon: [name], MD;  Location: [facility];  Service: Gynecology;  Laterality: N/A;  . LAPAROSCOPIC BILATERAL SALPINGO OOPHERECTOMY Bilateral 02/26/2016   Procedure: LAPAROSCOPIC BILATERAL SALPINGO OOPHORECTOMY;  Surgeon: [name], MD;  Location: [facility];  Service: Gynecology;  Laterality: Bilateral;  . LAPAROSCOPIC HYSTERECTOMY N/A 02/26/2016   Procedure: HYSTERECTOMY TOTAL LAPAROSCOPIC;  Surgeon: [name], MD;  Location: [facility];  Service: Gynecology;  Laterality: N/A;  . SENTINEL NODE BIOPSY N/A 02/26/2016   Procedure: SENTINEL NODE BIOPSY;  Surgeon: [name], MD;  Location: [facility];  Service: Gynecology;  Laterality: N/A;  . TEE WITHOUT CARDIOVERSION N/A 06/01/2018   Procedure: TRANSESOPHAGEAL ECHOCARDIOGRAM (TEE);  Surgeon: [name], MD;  Location: [facility];  Service: Cardiovascular;  Laterality: N/A;  . TONSILLECTOMY      HEMATOLOGY/ONCOLOGY HISTORY:  Oncology History   No history exists.    ALLERGIES:  is allergic to sulfa antibiotics  and zinc.  MEDICATIONS:  Current Facility-Administered Medications  Medication Dose Route Frequency Provider Last Rate Last Admin  . acetaminophen (TYLENOL) tablet 650 mg   650 mg Oral Q6H PRN Howerter, Justin B, DO   650 mg at 01/18/20 6213   Or  . acetaminophen (TYLENOL) suppository 650 mg  650 mg Rectal Q6H PRN Howerter, Justin B, DO      . apixaban (ELIQUIS) tablet 5 mg  5 mg Oral BID Howerter, Justin B, DO   5 mg at 01/18/20 2120  . atorvastatin (LIPITOR) tablet 10 mg  10 mg Oral q1800 Howerter, Justin B, DO   10 mg at 01/18/20 1849  . feeding supplement (BOOST / RESOURCE BREEZE) liquid 1 Container  1 Container Oral TID BM Max Sane, MD      . levothyroxine (SYNTHROID) tablet 75 mcg  75 mcg Oral Q0600 Max Sane, MD   75 mcg at 01/19/20 0543  . metoprolol tartrate (LOPRESSOR) tablet 25 mg  25 mg Oral BID Max Sane, MD   25 mg at 01/18/20 2120  . ondansetron (ZOFRAN) injection 4 mg  4 mg Intravenous Q6H PRN Howerter, Justin B, DO   4 mg at 01/16/20 2136  . oxyCODONE (Oxy IR/ROXICODONE) immediate release tablet 10 mg  10 mg Oral Q4H PRN Howerter, Justin B, DO   10 mg at 01/18/20 1538  . oxyCODONE (OXYCONTIN) 12 hr tablet 10 mg  10 mg Oral Q8H Melita Villalona, [name], NP   10 mg at 01/19/20 1059    VITAL SIGNS: BP 132/80 (BP Location: Left Arm)   Pulse 75   Temp 99.2 F (37.3 C) (Oral)   Resp 20   Ht 5\' 1"  (1.549 m)   Wt 102 lb (46.3 kg)   SpO2 91%   BMI 19.27 kg/m  Filed Weights   01/17/20 1200 01/18/20 0500 01/19/20 0500  Weight: 116 lb (52.6 kg) 102 lb (46.3 kg) 102 lb (46.3 kg)    Estimated body mass index is 19.27 kg/m as calculated from the following:   Height as of this encounter: 5\' 1"  (1.549 m).   Weight as of this encounter: 102 lb (46.3 kg).  LABS: CBC:    Component Value Date/Time   WBC 9.4 01/19/2020 0613   HGB 9.8 (L) 01/19/2020 0613   HCT 29.8 (L) 01/19/2020 0613   PLT 250 01/19/2020 0613   MCV 91.7 01/19/2020 0613   NEUTROABS 5.3 01/17/2020 0329   LYMPHSABS 1.3 01/17/2020 0329   MONOABS 0.6 01/17/2020 0329   EOSABS 0.1 01/17/2020 0329   BASOSABS 0.0 01/17/2020 0329   Comprehensive Metabolic Panel:    Component Value  Date/Time   NA 133 (L) 01/19/2020 0613   K 4.0 01/19/2020 0613   CL 99 01/19/2020 0613   CO2 23 01/19/2020 0613   BUN 10 01/19/2020 0613   CREATININE 0.54 01/19/2020 0613   GLUCOSE 105 (H) 01/19/2020 0613   CALCIUM 8.6 (L) 01/19/2020 0613   AST 36 01/17/2020 0329   ALT 14 01/17/2020 0329   ALKPHOS 79 01/17/2020 0329   BILITOT 0.8 01/17/2020 0329   PROT 7.3 01/17/2020 0329   ALBUMIN 3.6 01/17/2020 0329    RADIOGRAPHIC STUDIES: DG Chest 2 View  Result Date: 01/16/2020 CLINICAL DATA:  Chest pain.  Recent fall. EXAM: CHEST - 2 VIEW COMPARISON:  May 30, 2018 FINDINGS: The heart size and mediastinal contours are within normal limits. Both lungs are clear. The visualized skeletal structures are unremarkable. IMPRESSION: No active cardiopulmonary disease. Electronically Signed  By: Constance Holster M.D.   On: 01/16/2020 15:17   DG Pelvis 1-2 Views  Result Date: 01/16/2020 CLINICAL DATA:  Pain status post fall EXAM: PELVIS - 1-2 VIEW COMPARISON:  CT dated September 05, 2019 FINDINGS: There is no acute displaced fracture or dislocation. There is increasing sclerosis within the left iliac bone. There is diffuse heterogeneous osteopenia of the visualized osseous structures. An IVC filter is noted. Mild degenerative changes are noted of the hip. IMPRESSION: 1. No acute displaced fracture or dislocation. 2. Increasing sclerosis within the left iliac bone is likely related to the previously demonstrated mass within the left iliacus muscle. 3. Diffuse heterogeneous osteopenia. 4. Mild degenerative changes of the hip. Electronically Signed   By: Constance Holster M.D.   On: 01/16/2020 15:20   DG Tibia/Fibula Left  Result Date: 01/16/2020 CLINICAL DATA:  Pain status post fall EXAM: LEFT TIBIA AND FIBULA - 2 VIEW COMPARISON:  None. FINDINGS: There is no evidence of fracture or other focal bone lesions. Soft tissues are unremarkable. IMPRESSION: Negative. Electronically Signed   By: Constance Holster  M.D.   On: 01/16/2020 15:20   CT PELVIS WO CONTRAST  Addendum Date: 01/18/2020   ADDENDUM REPORT: 01/18/2020 10:46 ADDENDUM: The COMPARISON section should read as follows: COMPARISON:  Pelvic radiographs dated 01/16/2020. Electronically Signed   By: Zerita Boers M.D.   On: 01/18/2020 10:46   Result Date: 01/18/2020 CLINICAL DATA:  Lower leg pain, fracture suspected. Negative radiographs. EXAM: CT PELVIS WITHOUT CONTRAST TECHNIQUE: Multidetector CT imaging of the pelvis was performed following the standard protocol without intravenous contrast. COMPARISON:  None. FINDINGS: Urinary Tract:  No abnormality visualized. Bowel: There is colonic diverticulosis without evidence of diverticulitis. Vascular/Lymphatic: No pathologically enlarged lymph nodes. No significant vascular abnormality seen. Reproductive:  No mass or other significant abnormality Other: A large mass involving the left iliopsoas measures 9.0 x 5.7 cm in the axial plane compared to 8.5 x 5.2 cm in the axial plane on 09/14/2019. There is progression of cortical erosion of the adjacent left iliac bone. Musculoskeletal: Degenerative changes are seen in the spine. No acute osseous injury. The bones are diffusely demineralized. IMPRESSION: 1. No acute osseous injury. 2. Large mass involving the left iliopsoas has slightly increased in size since 09/14/2019 and is consistent with malignancy. There is progression of cortical erosion of the adjacent left iliac bone. Electronically Signed: By: Zerita Boers M.D. On: 01/18/2020 10:35   CT TIBIA FIBULA LEFT WO CONTRAST  Result Date: 01/18/2020 CLINICAL DATA:  Lower leg pain, stress fracture suspected. Negative radiographs. EXAM: CT OF THE LOWER LEFT EXTREMITY WITHOUT CONTRAST TECHNIQUE: Multidetector CT imaging of the lower left extremity was performed according to the standard protocol. COMPARISON:  Radiographs of the tibia and fibula dated 01/16/2020. FINDINGS: Bones/Joint/Cartilage No acute osseous  injury. No knee joint or ankle joint effusions. Moderate degenerative changes are seen in the knee joint. Ligaments Suboptimally assessed by CT. Muscles and Tendons Normal. Soft tissues There is soft tissue swelling and subcutaneous fat stranding overlying the anterior aspect of the tibial diaphysis, likely reflecting bruise due to trauma. IMPRESSION: 1. No acute osseous injury. Electronically Signed   By: Zerita Boers M.D.   On: 01/18/2020 10:45   DG FEMUR MIN 2 VIEWS LEFT  Result Date: 01/16/2020 CLINICAL DATA:  Pain status post fall EXAM: LEFT FEMUR 2 VIEWS COMPARISON:  None. FINDINGS: There is no evidence of fracture or other focal bone lesions. Soft tissues are unremarkable. IMPRESSION: Negative. Electronically Signed  By: Constance Holster M.D.   On: 01/16/2020 15:18    PERFORMANCE STATUS (ECOG) : 3 - Symptomatic, >50% confined to bed  Review of Systems Unless otherwise noted, a complete review of systems is negative.  Physical Exam General: NAD Pulmonary: Unlabored Extremities: no edema, no joint deformities Skin: no rashes Neurological: Weakness but otherwise nonfocal  IMPRESSION: Routine follow-up visit.  Patient reports sleeping better last night.  She says pain is better controlled today after starting OxyContin.  Unfortunately, OxyContin would not be covered by hospice formulary.  Will rotate to MS Contin 15mg  every 8 hours.  Case discussed with hospice liaison who is coordinating discharge plan.  PLAN: -Best supportive care -Hospice referral pending -Rotate from OxyContin to MS Contin 15 mg every 8 hours due to hospice formulary  -Rx sent to pharmacy for MS Contin and I called the pharmacy to cancel previous prescription -Continue oxycodone as needed for breakthrough pain -Continue bowel regimen  Case and plan discussed with Dr. Manuella Ghazi and hospice liaison    Time Total: 25 minutes  Visit consisted of counseling and education dealing with the complex and emotionally  intense issues of symptom management and palliative care in the setting of serious and potentially life-threatening illness.Greater than 50%  of this time was spent counseling and coordinating care related to the above assessment and plan.  Signed by: Altha Harm, PhD, NP-C

## 2020-01-20 LAB — METHYLMALONIC ACID, SERUM: Methylmalonic Acid, Quantitative: 106 nmol/L (ref 0–378)

## 2020-01-21 DIAGNOSIS — C7951 Secondary malignant neoplasm of bone: Secondary | ICD-10-CM | POA: Diagnosis not present

## 2020-01-21 DIAGNOSIS — N1330 Unspecified hydronephrosis: Secondary | ICD-10-CM | POA: Diagnosis not present

## 2020-01-21 DIAGNOSIS — Z923 Personal history of irradiation: Secondary | ICD-10-CM | POA: Diagnosis not present

## 2020-01-21 DIAGNOSIS — E785 Hyperlipidemia, unspecified: Secondary | ICD-10-CM | POA: Diagnosis not present

## 2020-01-21 DIAGNOSIS — Z681 Body mass index (BMI) 19 or less, adult: Secondary | ICD-10-CM | POA: Diagnosis not present

## 2020-01-21 DIAGNOSIS — K5909 Other constipation: Secondary | ICD-10-CM | POA: Diagnosis not present

## 2020-01-21 DIAGNOSIS — Z90710 Acquired absence of both cervix and uterus: Secondary | ICD-10-CM | POA: Diagnosis not present

## 2020-01-21 DIAGNOSIS — I509 Heart failure, unspecified: Secondary | ICD-10-CM | POA: Diagnosis not present

## 2020-01-21 DIAGNOSIS — I2699 Other pulmonary embolism without acute cor pulmonale: Secondary | ICD-10-CM | POA: Diagnosis not present

## 2020-01-21 DIAGNOSIS — Z9221 Personal history of antineoplastic chemotherapy: Secondary | ICD-10-CM | POA: Diagnosis not present

## 2020-01-21 DIAGNOSIS — E039 Hypothyroidism, unspecified: Secondary | ICD-10-CM | POA: Diagnosis not present

## 2020-01-21 DIAGNOSIS — M1990 Unspecified osteoarthritis, unspecified site: Secondary | ICD-10-CM | POA: Diagnosis not present

## 2020-01-21 DIAGNOSIS — R627 Adult failure to thrive: Secondary | ICD-10-CM | POA: Diagnosis not present

## 2020-01-21 DIAGNOSIS — I110 Hypertensive heart disease with heart failure: Secondary | ICD-10-CM | POA: Diagnosis not present

## 2020-01-21 DIAGNOSIS — C541 Malignant neoplasm of endometrium: Secondary | ICD-10-CM | POA: Diagnosis not present

## 2020-01-21 DIAGNOSIS — I82409 Acute embolism and thrombosis of unspecified deep veins of unspecified lower extremity: Secondary | ICD-10-CM | POA: Diagnosis not present

## 2020-01-21 DIAGNOSIS — Z7901 Long term (current) use of anticoagulants: Secondary | ICD-10-CM | POA: Diagnosis not present

## 2020-01-21 DIAGNOSIS — C7989 Secondary malignant neoplasm of other specified sites: Secondary | ICD-10-CM | POA: Diagnosis not present

## 2020-01-22 ENCOUNTER — Ambulatory Visit: Payer: Medicare Other

## 2020-01-22 ENCOUNTER — Other Ambulatory Visit: Payer: Medicare Other

## 2020-01-22 ENCOUNTER — Other Ambulatory Visit: Payer: Self-pay | Admitting: Hospice and Palliative Medicine

## 2020-01-22 ENCOUNTER — Encounter: Payer: Medicare Other | Admitting: Hospice and Palliative Medicine

## 2020-01-22 ENCOUNTER — Telehealth: Payer: Self-pay | Admitting: Hospice and Palliative Medicine

## 2020-01-22 ENCOUNTER — Ambulatory Visit: Payer: Medicare Other | Admitting: Oncology

## 2020-01-22 DIAGNOSIS — E785 Hyperlipidemia, unspecified: Secondary | ICD-10-CM | POA: Diagnosis not present

## 2020-01-22 DIAGNOSIS — R627 Adult failure to thrive: Secondary | ICD-10-CM | POA: Diagnosis not present

## 2020-01-22 DIAGNOSIS — C541 Malignant neoplasm of endometrium: Secondary | ICD-10-CM | POA: Diagnosis not present

## 2020-01-22 DIAGNOSIS — Z923 Personal history of irradiation: Secondary | ICD-10-CM | POA: Diagnosis not present

## 2020-01-22 DIAGNOSIS — Z90710 Acquired absence of both cervix and uterus: Secondary | ICD-10-CM | POA: Diagnosis not present

## 2020-01-22 DIAGNOSIS — I509 Heart failure, unspecified: Secondary | ICD-10-CM | POA: Diagnosis not present

## 2020-01-22 DIAGNOSIS — I82409 Acute embolism and thrombosis of unspecified deep veins of unspecified lower extremity: Secondary | ICD-10-CM | POA: Diagnosis not present

## 2020-01-22 DIAGNOSIS — Z9221 Personal history of antineoplastic chemotherapy: Secondary | ICD-10-CM | POA: Diagnosis not present

## 2020-01-22 DIAGNOSIS — K5909 Other constipation: Secondary | ICD-10-CM | POA: Diagnosis not present

## 2020-01-22 DIAGNOSIS — N1330 Unspecified hydronephrosis: Secondary | ICD-10-CM | POA: Diagnosis not present

## 2020-01-22 DIAGNOSIS — I2699 Other pulmonary embolism without acute cor pulmonale: Secondary | ICD-10-CM | POA: Diagnosis not present

## 2020-01-22 DIAGNOSIS — C7951 Secondary malignant neoplasm of bone: Secondary | ICD-10-CM | POA: Diagnosis not present

## 2020-01-22 DIAGNOSIS — Z7901 Long term (current) use of anticoagulants: Secondary | ICD-10-CM | POA: Diagnosis not present

## 2020-01-22 DIAGNOSIS — E039 Hypothyroidism, unspecified: Secondary | ICD-10-CM | POA: Diagnosis not present

## 2020-01-22 DIAGNOSIS — C7989 Secondary malignant neoplasm of other specified sites: Secondary | ICD-10-CM | POA: Diagnosis not present

## 2020-01-22 DIAGNOSIS — M1990 Unspecified osteoarthritis, unspecified site: Secondary | ICD-10-CM | POA: Diagnosis not present

## 2020-01-22 DIAGNOSIS — I110 Hypertensive heart disease with heart failure: Secondary | ICD-10-CM | POA: Diagnosis not present

## 2020-01-22 DIAGNOSIS — Z681 Body mass index (BMI) 19 or less, adult: Secondary | ICD-10-CM | POA: Diagnosis not present

## 2020-01-22 MED ORDER — LORAZEPAM 0.5 MG PO TABS: 0.5000 mg | ORAL_TABLET | Freq: Three times a day (TID) | ORAL | 0 refills | Status: DC | PRN

## 2020-01-22 NOTE — Progress Notes (Signed)
Request from hospice nurse for lorazepam. Will send Rx.

## 2020-01-22 NOTE — Telephone Encounter (Signed)
I received a call from patient's hospice nurse, April.  Since returning home, patient has continued to have severe and persistent generalized pain.  She has been taking the oxycodone every 4 hours in addition to the MS Contin 3 times daily.  Nurse was requesting to increase oxycodone IR every 3 hours as needed.  Plan: -Okay to increase oxycodone 10 mg every 3-4 hours as needed for breakthrough pain -Will increase MS Contin to 30 mg every 12 hours -Continue daily bowel regimen

## 2020-01-25 DIAGNOSIS — C7989 Secondary malignant neoplasm of other specified sites: Secondary | ICD-10-CM | POA: Diagnosis not present

## 2020-01-25 DIAGNOSIS — I2699 Other pulmonary embolism without acute cor pulmonale: Secondary | ICD-10-CM | POA: Diagnosis not present

## 2020-01-25 DIAGNOSIS — C541 Malignant neoplasm of endometrium: Secondary | ICD-10-CM | POA: Diagnosis not present

## 2020-01-25 DIAGNOSIS — C7951 Secondary malignant neoplasm of bone: Secondary | ICD-10-CM | POA: Diagnosis not present

## 2020-01-25 DIAGNOSIS — I110 Hypertensive heart disease with heart failure: Secondary | ICD-10-CM | POA: Diagnosis not present

## 2020-01-25 DIAGNOSIS — I82409 Acute embolism and thrombosis of unspecified deep veins of unspecified lower extremity: Secondary | ICD-10-CM | POA: Diagnosis not present

## 2020-01-29 ENCOUNTER — Telehealth: Payer: Self-pay | Admitting: Hospice and Palliative Medicine

## 2020-01-29 DIAGNOSIS — C7989 Secondary malignant neoplasm of other specified sites: Secondary | ICD-10-CM | POA: Diagnosis not present

## 2020-01-29 DIAGNOSIS — I82409 Acute embolism and thrombosis of unspecified deep veins of unspecified lower extremity: Secondary | ICD-10-CM | POA: Diagnosis not present

## 2020-01-29 DIAGNOSIS — I110 Hypertensive heart disease with heart failure: Secondary | ICD-10-CM | POA: Diagnosis not present

## 2020-01-29 DIAGNOSIS — C541 Malignant neoplasm of endometrium: Secondary | ICD-10-CM | POA: Diagnosis not present

## 2020-01-29 DIAGNOSIS — C7951 Secondary malignant neoplasm of bone: Secondary | ICD-10-CM | POA: Diagnosis not present

## 2020-01-29 DIAGNOSIS — I2699 Other pulmonary embolism without acute cor pulmonale: Secondary | ICD-10-CM | POA: Diagnosis not present

## 2020-01-29 MED ORDER — OXYCODONE HCL 10 MG PO TABS
10.0000 mg | ORAL_TABLET | ORAL | 0 refills | Status: DC | PRN
Start: 2020-01-29 — End: 2020-02-19

## 2020-01-29 MED ORDER — LORAZEPAM 0.5 MG PO TABS: 0.5000 mg | ORAL_TABLET | ORAL | 0 refills | Status: AC | PRN

## 2020-01-29 MED ORDER — MORPHINE SULFATE ER 30 MG PO TBCR: 30.0000 mg | EXTENDED_RELEASE_TABLET | Freq: Three times a day (TID) | ORAL | 0 refills | Status: DC

## 2020-01-29 MED ORDER — ONDANSETRON HCL 8 MG PO TABS: 8.0000 mg | ORAL_TABLET | Freq: Three times a day (TID) | ORAL | 0 refills | Status: DC | PRN

## 2020-01-29 NOTE — Telephone Encounter (Signed)
Received a call from patient's hospice nurse, April.  Patient remains in severe generalized pain despite using oxycodone every 3 hours as needed.  Pain was not significantly improved after increasing MS Contin last week.  We will again increase MS Contin to 30 mg every 8 hours.  Continue use of oxycodone as needed for breakthrough pain.  We will also liberalize frequency of lorazepam to every 4 hours as needed.  Patient reportedly is eating sips and bites.  We discussed option of transferring her to hospice [facility].

## 2020-02-01 ENCOUNTER — Other Ambulatory Visit: Payer: Medicare Other | Admitting: Nurse Practitioner

## 2020-02-01 DIAGNOSIS — I82409 Acute embolism and thrombosis of unspecified deep veins of unspecified lower extremity: Secondary | ICD-10-CM | POA: Diagnosis not present

## 2020-02-01 DIAGNOSIS — I110 Hypertensive heart disease with heart failure: Secondary | ICD-10-CM | POA: Diagnosis not present

## 2020-02-01 DIAGNOSIS — C7951 Secondary malignant neoplasm of bone: Secondary | ICD-10-CM | POA: Diagnosis not present

## 2020-02-01 DIAGNOSIS — C7989 Secondary malignant neoplasm of other specified sites: Secondary | ICD-10-CM | POA: Diagnosis not present

## 2020-02-01 DIAGNOSIS — C541 Malignant neoplasm of endometrium: Secondary | ICD-10-CM | POA: Diagnosis not present

## 2020-02-01 DIAGNOSIS — I2699 Other pulmonary embolism without acute cor pulmonale: Secondary | ICD-10-CM | POA: Diagnosis not present

## 2020-02-05 ENCOUNTER — Telehealth: Payer: Self-pay | Admitting: Hospice and Palliative Medicine

## 2020-02-05 DIAGNOSIS — I82409 Acute embolism and thrombosis of unspecified deep veins of unspecified lower extremity: Secondary | ICD-10-CM | POA: Diagnosis not present

## 2020-02-05 DIAGNOSIS — I2699 Other pulmonary embolism without acute cor pulmonale: Secondary | ICD-10-CM | POA: Diagnosis not present

## 2020-02-05 DIAGNOSIS — C541 Malignant neoplasm of endometrium: Secondary | ICD-10-CM | POA: Diagnosis not present

## 2020-02-05 DIAGNOSIS — C7989 Secondary malignant neoplasm of other specified sites: Secondary | ICD-10-CM | POA: Diagnosis not present

## 2020-02-05 DIAGNOSIS — I110 Hypertensive heart disease with heart failure: Secondary | ICD-10-CM | POA: Diagnosis not present

## 2020-02-05 DIAGNOSIS — C7951 Secondary malignant neoplasm of bone: Secondary | ICD-10-CM | POA: Diagnosis not present

## 2020-02-05 NOTE — Telephone Encounter (Signed)
I spoke with patient's hospice nurse, April. Patient is still having persistent generalized pain. Pain was somewhat improved after increasing MS Contin to 30 mg every 8 hours the patient remains in pain. Discussed that she can also take her oxycodone 1 to 2 tablets (10 to 20 mg) every 3-4 hours as needed for breakthrough pain. Currently it does not appear that she is taking much oxycodone. Can also consider future increases of MS Contin. Discussed constipation management.

## 2020-02-06 DIAGNOSIS — C7951 Secondary malignant neoplasm of bone: Secondary | ICD-10-CM | POA: Diagnosis not present

## 2020-02-06 DIAGNOSIS — I82409 Acute embolism and thrombosis of unspecified deep veins of unspecified lower extremity: Secondary | ICD-10-CM | POA: Diagnosis not present

## 2020-02-06 DIAGNOSIS — I2699 Other pulmonary embolism without acute cor pulmonale: Secondary | ICD-10-CM | POA: Diagnosis not present

## 2020-02-06 DIAGNOSIS — I110 Hypertensive heart disease with heart failure: Secondary | ICD-10-CM | POA: Diagnosis not present

## 2020-02-06 DIAGNOSIS — C7989 Secondary malignant neoplasm of other specified sites: Secondary | ICD-10-CM | POA: Diagnosis not present

## 2020-02-06 DIAGNOSIS — C541 Malignant neoplasm of endometrium: Secondary | ICD-10-CM | POA: Diagnosis not present

## 2020-02-08 ENCOUNTER — Telehealth: Payer: Self-pay | Admitting: *Deleted

## 2020-02-08 DIAGNOSIS — C541 Malignant neoplasm of endometrium: Secondary | ICD-10-CM | POA: Diagnosis not present

## 2020-02-08 DIAGNOSIS — I110 Hypertensive heart disease with heart failure: Secondary | ICD-10-CM | POA: Diagnosis not present

## 2020-02-08 DIAGNOSIS — I82409 Acute embolism and thrombosis of unspecified deep veins of unspecified lower extremity: Secondary | ICD-10-CM | POA: Diagnosis not present

## 2020-02-08 DIAGNOSIS — I2699 Other pulmonary embolism without acute cor pulmonale: Secondary | ICD-10-CM | POA: Diagnosis not present

## 2020-02-08 DIAGNOSIS — C7951 Secondary malignant neoplasm of bone: Secondary | ICD-10-CM | POA: Diagnosis not present

## 2020-02-08 DIAGNOSIS — C7989 Secondary malignant neoplasm of other specified sites: Secondary | ICD-10-CM | POA: Diagnosis not present

## 2020-02-08 NOTE — Telephone Encounter (Signed)
I have a stack of papers to sign on my desk.  Not sure if there are in them,  I'll sign today.

## 2020-02-08 NOTE — Telephone Encounter (Signed)
Signed order has been faxed

## 2020-02-08 NOTE — Telephone Encounter (Signed)
April called looking for the comfort kit orders to be returned signed. I have looked through media tablet and do not see that they have been sent and have called April and Crystal back requesting that thye be faxed to us 

## 2020-02-12 DIAGNOSIS — I82409 Acute embolism and thrombosis of unspecified deep veins of unspecified lower extremity: Secondary | ICD-10-CM | POA: Diagnosis not present

## 2020-02-12 DIAGNOSIS — C541 Malignant neoplasm of endometrium: Secondary | ICD-10-CM | POA: Diagnosis not present

## 2020-02-12 DIAGNOSIS — I2699 Other pulmonary embolism without acute cor pulmonale: Secondary | ICD-10-CM | POA: Diagnosis not present

## 2020-02-12 DIAGNOSIS — C7951 Secondary malignant neoplasm of bone: Secondary | ICD-10-CM | POA: Diagnosis not present

## 2020-02-12 DIAGNOSIS — I110 Hypertensive heart disease with heart failure: Secondary | ICD-10-CM | POA: Diagnosis not present

## 2020-02-12 DIAGNOSIS — C7989 Secondary malignant neoplasm of other specified sites: Secondary | ICD-10-CM | POA: Diagnosis not present

## 2020-02-14 ENCOUNTER — Other Ambulatory Visit: Payer: Self-pay | Admitting: Hospice and Palliative Medicine

## 2020-02-14 MED ORDER — MORPHINE SULFATE ER 30 MG PO TBCR: 30.0000 mg | EXTENDED_RELEASE_TABLET | Freq: Three times a day (TID) | ORAL | 0 refills | Status: AC

## 2020-02-14 NOTE — Progress Notes (Signed)
I spoke with patient's hospice nurse. She requested refill of MS Contin.

## 2020-02-19 ENCOUNTER — Other Ambulatory Visit: Payer: Self-pay | Admitting: Hospice and Palliative Medicine

## 2020-02-19 ENCOUNTER — Other Ambulatory Visit: Payer: Self-pay | Admitting: *Deleted

## 2020-02-19 DIAGNOSIS — C541 Malignant neoplasm of endometrium: Secondary | ICD-10-CM | POA: Diagnosis not present

## 2020-02-19 DIAGNOSIS — I110 Hypertensive heart disease with heart failure: Secondary | ICD-10-CM | POA: Diagnosis not present

## 2020-02-19 DIAGNOSIS — C7951 Secondary malignant neoplasm of bone: Secondary | ICD-10-CM | POA: Diagnosis not present

## 2020-02-19 DIAGNOSIS — C7989 Secondary malignant neoplasm of other specified sites: Secondary | ICD-10-CM | POA: Diagnosis not present

## 2020-02-19 DIAGNOSIS — I82409 Acute embolism and thrombosis of unspecified deep veins of unspecified lower extremity: Secondary | ICD-10-CM | POA: Diagnosis not present

## 2020-02-19 DIAGNOSIS — I2699 Other pulmonary embolism without acute cor pulmonale: Secondary | ICD-10-CM | POA: Diagnosis not present

## 2020-02-19 MED ORDER — OXYCODONE HCL 10 MG PO TABS
10.0000 mg | ORAL_TABLET | ORAL | 0 refills | Status: AC | PRN
Start: 2020-02-19 — End: ?

## 2020-02-19 NOTE — Progress Notes (Signed)
I received a call from patient's hospice nurse. Oxycodone refill requested.  Reportedly, family is reluctant to give patient pain medication despite severe pain.  We discussed option of increasing MS Contin if needed.

## 2020-02-20 ENCOUNTER — Other Ambulatory Visit: Payer: Self-pay | Admitting: *Deleted

## 2020-02-20 MED ORDER — ONDANSETRON HCL 8 MG PO TABS: 8.0000 mg | ORAL_TABLET | Freq: Three times a day (TID) | ORAL | 0 refills | Status: AC | PRN

## 2020-02-21 DIAGNOSIS — I2699 Other pulmonary embolism without acute cor pulmonale: Secondary | ICD-10-CM | POA: Diagnosis not present

## 2020-02-21 DIAGNOSIS — Z681 Body mass index (BMI) 19 or less, adult: Secondary | ICD-10-CM | POA: Diagnosis not present

## 2020-02-21 DIAGNOSIS — E039 Hypothyroidism, unspecified: Secondary | ICD-10-CM | POA: Diagnosis not present

## 2020-02-21 DIAGNOSIS — C7951 Secondary malignant neoplasm of bone: Secondary | ICD-10-CM | POA: Diagnosis not present

## 2020-02-21 DIAGNOSIS — I110 Hypertensive heart disease with heart failure: Secondary | ICD-10-CM | POA: Diagnosis not present

## 2020-02-21 DIAGNOSIS — Z923 Personal history of irradiation: Secondary | ICD-10-CM | POA: Diagnosis not present

## 2020-02-21 DIAGNOSIS — E785 Hyperlipidemia, unspecified: Secondary | ICD-10-CM | POA: Diagnosis not present

## 2020-02-21 DIAGNOSIS — C541 Malignant neoplasm of endometrium: Secondary | ICD-10-CM | POA: Diagnosis not present

## 2020-02-21 DIAGNOSIS — Z90710 Acquired absence of both cervix and uterus: Secondary | ICD-10-CM | POA: Diagnosis not present

## 2020-02-21 DIAGNOSIS — I82409 Acute embolism and thrombosis of unspecified deep veins of unspecified lower extremity: Secondary | ICD-10-CM | POA: Diagnosis not present

## 2020-02-21 DIAGNOSIS — N1330 Unspecified hydronephrosis: Secondary | ICD-10-CM | POA: Diagnosis not present

## 2020-02-21 DIAGNOSIS — Z9221 Personal history of antineoplastic chemotherapy: Secondary | ICD-10-CM | POA: Diagnosis not present

## 2020-02-21 DIAGNOSIS — R627 Adult failure to thrive: Secondary | ICD-10-CM | POA: Diagnosis not present

## 2020-02-21 DIAGNOSIS — Z7901 Long term (current) use of anticoagulants: Secondary | ICD-10-CM | POA: Diagnosis not present

## 2020-02-21 DIAGNOSIS — C7989 Secondary malignant neoplasm of other specified sites: Secondary | ICD-10-CM | POA: Diagnosis not present

## 2020-02-21 DIAGNOSIS — I509 Heart failure, unspecified: Secondary | ICD-10-CM | POA: Diagnosis not present

## 2020-02-21 DIAGNOSIS — K5909 Other constipation: Secondary | ICD-10-CM | POA: Diagnosis not present

## 2020-02-21 DIAGNOSIS — M1990 Unspecified osteoarthritis, unspecified site: Secondary | ICD-10-CM | POA: Diagnosis not present

## 2020-02-22 DIAGNOSIS — C541 Malignant neoplasm of endometrium: Secondary | ICD-10-CM | POA: Diagnosis not present

## 2020-02-22 DIAGNOSIS — C7989 Secondary malignant neoplasm of other specified sites: Secondary | ICD-10-CM | POA: Diagnosis not present

## 2020-02-22 DIAGNOSIS — C7951 Secondary malignant neoplasm of bone: Secondary | ICD-10-CM | POA: Diagnosis not present

## 2020-02-22 DIAGNOSIS — R279 Unspecified lack of coordination: Secondary | ICD-10-CM | POA: Diagnosis not present

## 2020-02-22 DIAGNOSIS — Z743 Need for continuous supervision: Secondary | ICD-10-CM | POA: Diagnosis not present

## 2020-02-22 DIAGNOSIS — I2699 Other pulmonary embolism without acute cor pulmonale: Secondary | ICD-10-CM | POA: Diagnosis not present

## 2020-02-22 DIAGNOSIS — I82409 Acute embolism and thrombosis of unspecified deep veins of unspecified lower extremity: Secondary | ICD-10-CM | POA: Diagnosis not present

## 2020-02-22 DIAGNOSIS — R410 Disorientation, unspecified: Secondary | ICD-10-CM | POA: Diagnosis not present

## 2020-02-22 DIAGNOSIS — I110 Hypertensive heart disease with heart failure: Secondary | ICD-10-CM | POA: Diagnosis not present

## 2020-02-23 DIAGNOSIS — C541 Malignant neoplasm of endometrium: Secondary | ICD-10-CM | POA: Diagnosis not present

## 2020-02-23 DIAGNOSIS — I110 Hypertensive heart disease with heart failure: Secondary | ICD-10-CM | POA: Diagnosis not present

## 2020-02-23 DIAGNOSIS — I82409 Acute embolism and thrombosis of unspecified deep veins of unspecified lower extremity: Secondary | ICD-10-CM | POA: Diagnosis not present

## 2020-02-23 DIAGNOSIS — I2699 Other pulmonary embolism without acute cor pulmonale: Secondary | ICD-10-CM | POA: Diagnosis not present

## 2020-02-23 DIAGNOSIS — C7989 Secondary malignant neoplasm of other specified sites: Secondary | ICD-10-CM | POA: Diagnosis not present

## 2020-02-23 DIAGNOSIS — C7951 Secondary malignant neoplasm of bone: Secondary | ICD-10-CM | POA: Diagnosis not present

## 2020-02-24 DIAGNOSIS — I2699 Other pulmonary embolism without acute cor pulmonale: Secondary | ICD-10-CM | POA: Diagnosis not present

## 2020-02-24 DIAGNOSIS — C541 Malignant neoplasm of endometrium: Secondary | ICD-10-CM | POA: Diagnosis not present

## 2020-02-24 DIAGNOSIS — C7951 Secondary malignant neoplasm of bone: Secondary | ICD-10-CM | POA: Diagnosis not present

## 2020-02-24 DIAGNOSIS — C7989 Secondary malignant neoplasm of other specified sites: Secondary | ICD-10-CM | POA: Diagnosis not present

## 2020-02-24 DIAGNOSIS — I110 Hypertensive heart disease with heart failure: Secondary | ICD-10-CM | POA: Diagnosis not present

## 2020-02-24 DIAGNOSIS — I82409 Acute embolism and thrombosis of unspecified deep veins of unspecified lower extremity: Secondary | ICD-10-CM | POA: Diagnosis not present

## 2020-02-25 DIAGNOSIS — C7951 Secondary malignant neoplasm of bone: Secondary | ICD-10-CM | POA: Diagnosis not present

## 2020-02-25 DIAGNOSIS — I82409 Acute embolism and thrombosis of unspecified deep veins of unspecified lower extremity: Secondary | ICD-10-CM | POA: Diagnosis not present

## 2020-02-25 DIAGNOSIS — I2699 Other pulmonary embolism without acute cor pulmonale: Secondary | ICD-10-CM | POA: Diagnosis not present

## 2020-02-25 DIAGNOSIS — I110 Hypertensive heart disease with heart failure: Secondary | ICD-10-CM | POA: Diagnosis not present

## 2020-02-25 DIAGNOSIS — C7989 Secondary malignant neoplasm of other specified sites: Secondary | ICD-10-CM | POA: Diagnosis not present

## 2020-02-25 DIAGNOSIS — C541 Malignant neoplasm of endometrium: Secondary | ICD-10-CM | POA: Diagnosis not present

## 2020-02-26 DIAGNOSIS — I82409 Acute embolism and thrombosis of unspecified deep veins of unspecified lower extremity: Secondary | ICD-10-CM | POA: Diagnosis not present

## 2020-02-26 DIAGNOSIS — I2699 Other pulmonary embolism without acute cor pulmonale: Secondary | ICD-10-CM | POA: Diagnosis not present

## 2020-02-26 DIAGNOSIS — C7951 Secondary malignant neoplasm of bone: Secondary | ICD-10-CM | POA: Diagnosis not present

## 2020-02-26 DIAGNOSIS — C541 Malignant neoplasm of endometrium: Secondary | ICD-10-CM | POA: Diagnosis not present

## 2020-02-26 DIAGNOSIS — C7989 Secondary malignant neoplasm of other specified sites: Secondary | ICD-10-CM | POA: Diagnosis not present

## 2020-02-26 DIAGNOSIS — I110 Hypertensive heart disease with heart failure: Secondary | ICD-10-CM | POA: Diagnosis not present

## 2020-02-27 DIAGNOSIS — I2699 Other pulmonary embolism without acute cor pulmonale: Secondary | ICD-10-CM | POA: Diagnosis not present

## 2020-02-27 DIAGNOSIS — C7951 Secondary malignant neoplasm of bone: Secondary | ICD-10-CM | POA: Diagnosis not present

## 2020-02-27 DIAGNOSIS — I110 Hypertensive heart disease with heart failure: Secondary | ICD-10-CM | POA: Diagnosis not present

## 2020-02-27 DIAGNOSIS — C7989 Secondary malignant neoplasm of other specified sites: Secondary | ICD-10-CM | POA: Diagnosis not present

## 2020-02-27 DIAGNOSIS — C541 Malignant neoplasm of endometrium: Secondary | ICD-10-CM | POA: Diagnosis not present

## 2020-02-27 DIAGNOSIS — I82409 Acute embolism and thrombosis of unspecified deep veins of unspecified lower extremity: Secondary | ICD-10-CM | POA: Diagnosis not present

## 2020-02-28 DIAGNOSIS — C7989 Secondary malignant neoplasm of other specified sites: Secondary | ICD-10-CM | POA: Diagnosis not present

## 2020-02-28 DIAGNOSIS — I82409 Acute embolism and thrombosis of unspecified deep veins of unspecified lower extremity: Secondary | ICD-10-CM | POA: Diagnosis not present

## 2020-02-28 DIAGNOSIS — I110 Hypertensive heart disease with heart failure: Secondary | ICD-10-CM | POA: Diagnosis not present

## 2020-02-28 DIAGNOSIS — I2699 Other pulmonary embolism without acute cor pulmonale: Secondary | ICD-10-CM | POA: Diagnosis not present

## 2020-02-28 DIAGNOSIS — C541 Malignant neoplasm of endometrium: Secondary | ICD-10-CM | POA: Diagnosis not present

## 2020-02-28 DIAGNOSIS — C7951 Secondary malignant neoplasm of bone: Secondary | ICD-10-CM | POA: Diagnosis not present

## 2020-02-29 DIAGNOSIS — C7989 Secondary malignant neoplasm of other specified sites: Secondary | ICD-10-CM | POA: Diagnosis not present

## 2020-02-29 DIAGNOSIS — I82409 Acute embolism and thrombosis of unspecified deep veins of unspecified lower extremity: Secondary | ICD-10-CM | POA: Diagnosis not present

## 2020-02-29 DIAGNOSIS — I110 Hypertensive heart disease with heart failure: Secondary | ICD-10-CM | POA: Diagnosis not present

## 2020-02-29 DIAGNOSIS — C541 Malignant neoplasm of endometrium: Secondary | ICD-10-CM | POA: Diagnosis not present

## 2020-02-29 DIAGNOSIS — I2699 Other pulmonary embolism without acute cor pulmonale: Secondary | ICD-10-CM | POA: Diagnosis not present

## 2020-02-29 DIAGNOSIS — C7951 Secondary malignant neoplasm of bone: Secondary | ICD-10-CM | POA: Diagnosis not present

## 2020-03-01 DIAGNOSIS — I2699 Other pulmonary embolism without acute cor pulmonale: Secondary | ICD-10-CM | POA: Diagnosis not present

## 2020-03-01 DIAGNOSIS — I82409 Acute embolism and thrombosis of unspecified deep veins of unspecified lower extremity: Secondary | ICD-10-CM | POA: Diagnosis not present

## 2020-03-01 DIAGNOSIS — C541 Malignant neoplasm of endometrium: Secondary | ICD-10-CM | POA: Diagnosis not present

## 2020-03-01 DIAGNOSIS — I110 Hypertensive heart disease with heart failure: Secondary | ICD-10-CM | POA: Diagnosis not present

## 2020-03-01 DIAGNOSIS — C7951 Secondary malignant neoplasm of bone: Secondary | ICD-10-CM | POA: Diagnosis not present

## 2020-03-01 DIAGNOSIS — C7989 Secondary malignant neoplasm of other specified sites: Secondary | ICD-10-CM | POA: Diagnosis not present

## 2020-03-02 DIAGNOSIS — C541 Malignant neoplasm of endometrium: Secondary | ICD-10-CM | POA: Diagnosis not present

## 2020-03-02 DIAGNOSIS — I82409 Acute embolism and thrombosis of unspecified deep veins of unspecified lower extremity: Secondary | ICD-10-CM | POA: Diagnosis not present

## 2020-03-02 DIAGNOSIS — C7989 Secondary malignant neoplasm of other specified sites: Secondary | ICD-10-CM | POA: Diagnosis not present

## 2020-03-02 DIAGNOSIS — I110 Hypertensive heart disease with heart failure: Secondary | ICD-10-CM | POA: Diagnosis not present

## 2020-03-02 DIAGNOSIS — I2699 Other pulmonary embolism without acute cor pulmonale: Secondary | ICD-10-CM | POA: Diagnosis not present

## 2020-03-02 DIAGNOSIS — C7951 Secondary malignant neoplasm of bone: Secondary | ICD-10-CM | POA: Diagnosis not present

## 2020-03-03 DIAGNOSIS — C7989 Secondary malignant neoplasm of other specified sites: Secondary | ICD-10-CM | POA: Diagnosis not present

## 2020-03-03 DIAGNOSIS — I110 Hypertensive heart disease with heart failure: Secondary | ICD-10-CM | POA: Diagnosis not present

## 2020-03-03 DIAGNOSIS — C7951 Secondary malignant neoplasm of bone: Secondary | ICD-10-CM | POA: Diagnosis not present

## 2020-03-03 DIAGNOSIS — I82409 Acute embolism and thrombosis of unspecified deep veins of unspecified lower extremity: Secondary | ICD-10-CM | POA: Diagnosis not present

## 2020-03-03 DIAGNOSIS — C541 Malignant neoplasm of endometrium: Secondary | ICD-10-CM | POA: Diagnosis not present

## 2020-03-03 DIAGNOSIS — I2699 Other pulmonary embolism without acute cor pulmonale: Secondary | ICD-10-CM | POA: Diagnosis not present

## 2020-03-04 DIAGNOSIS — C541 Malignant neoplasm of endometrium: Secondary | ICD-10-CM | POA: Diagnosis not present

## 2020-03-04 DIAGNOSIS — I2699 Other pulmonary embolism without acute cor pulmonale: Secondary | ICD-10-CM | POA: Diagnosis not present

## 2020-03-04 DIAGNOSIS — C7989 Secondary malignant neoplasm of other specified sites: Secondary | ICD-10-CM | POA: Diagnosis not present

## 2020-03-04 DIAGNOSIS — C7951 Secondary malignant neoplasm of bone: Secondary | ICD-10-CM | POA: Diagnosis not present

## 2020-03-04 DIAGNOSIS — I110 Hypertensive heart disease with heart failure: Secondary | ICD-10-CM | POA: Diagnosis not present

## 2020-03-04 DIAGNOSIS — I82409 Acute embolism and thrombosis of unspecified deep veins of unspecified lower extremity: Secondary | ICD-10-CM | POA: Diagnosis not present

## 2020-03-05 DIAGNOSIS — C541 Malignant neoplasm of endometrium: Secondary | ICD-10-CM | POA: Diagnosis not present

## 2020-03-05 DIAGNOSIS — I110 Hypertensive heart disease with heart failure: Secondary | ICD-10-CM | POA: Diagnosis not present

## 2020-03-05 DIAGNOSIS — C7951 Secondary malignant neoplasm of bone: Secondary | ICD-10-CM | POA: Diagnosis not present

## 2020-03-05 DIAGNOSIS — I2699 Other pulmonary embolism without acute cor pulmonale: Secondary | ICD-10-CM | POA: Diagnosis not present

## 2020-03-05 DIAGNOSIS — C7989 Secondary malignant neoplasm of other specified sites: Secondary | ICD-10-CM | POA: Diagnosis not present

## 2020-03-05 DIAGNOSIS — I82409 Acute embolism and thrombosis of unspecified deep veins of unspecified lower extremity: Secondary | ICD-10-CM | POA: Diagnosis not present

## 2020-03-06 DIAGNOSIS — I2699 Other pulmonary embolism without acute cor pulmonale: Secondary | ICD-10-CM | POA: Diagnosis not present

## 2020-03-06 DIAGNOSIS — C7951 Secondary malignant neoplasm of bone: Secondary | ICD-10-CM | POA: Diagnosis not present

## 2020-03-06 DIAGNOSIS — C7989 Secondary malignant neoplasm of other specified sites: Secondary | ICD-10-CM | POA: Diagnosis not present

## 2020-03-06 DIAGNOSIS — I110 Hypertensive heart disease with heart failure: Secondary | ICD-10-CM | POA: Diagnosis not present

## 2020-03-06 DIAGNOSIS — C541 Malignant neoplasm of endometrium: Secondary | ICD-10-CM | POA: Diagnosis not present

## 2020-03-06 DIAGNOSIS — I82409 Acute embolism and thrombosis of unspecified deep veins of unspecified lower extremity: Secondary | ICD-10-CM | POA: Diagnosis not present

## 2020-03-07 DIAGNOSIS — C7951 Secondary malignant neoplasm of bone: Secondary | ICD-10-CM | POA: Diagnosis not present

## 2020-03-07 DIAGNOSIS — I2699 Other pulmonary embolism without acute cor pulmonale: Secondary | ICD-10-CM | POA: Diagnosis not present

## 2020-03-07 DIAGNOSIS — I82409 Acute embolism and thrombosis of unspecified deep veins of unspecified lower extremity: Secondary | ICD-10-CM | POA: Diagnosis not present

## 2020-03-07 DIAGNOSIS — I110 Hypertensive heart disease with heart failure: Secondary | ICD-10-CM | POA: Diagnosis not present

## 2020-03-07 DIAGNOSIS — C541 Malignant neoplasm of endometrium: Secondary | ICD-10-CM | POA: Diagnosis not present

## 2020-03-07 DIAGNOSIS — C7989 Secondary malignant neoplasm of other specified sites: Secondary | ICD-10-CM | POA: Diagnosis not present

## 2020-03-08 DIAGNOSIS — I2699 Other pulmonary embolism without acute cor pulmonale: Secondary | ICD-10-CM | POA: Diagnosis not present

## 2020-03-08 DIAGNOSIS — C541 Malignant neoplasm of endometrium: Secondary | ICD-10-CM | POA: Diagnosis not present

## 2020-03-08 DIAGNOSIS — I82409 Acute embolism and thrombosis of unspecified deep veins of unspecified lower extremity: Secondary | ICD-10-CM | POA: Diagnosis not present

## 2020-03-08 DIAGNOSIS — C7951 Secondary malignant neoplasm of bone: Secondary | ICD-10-CM | POA: Diagnosis not present

## 2020-03-08 DIAGNOSIS — C7989 Secondary malignant neoplasm of other specified sites: Secondary | ICD-10-CM | POA: Diagnosis not present

## 2020-03-08 DIAGNOSIS — I110 Hypertensive heart disease with heart failure: Secondary | ICD-10-CM | POA: Diagnosis not present

## 2020-03-09 DIAGNOSIS — I2699 Other pulmonary embolism without acute cor pulmonale: Secondary | ICD-10-CM | POA: Diagnosis not present

## 2020-03-09 DIAGNOSIS — C541 Malignant neoplasm of endometrium: Secondary | ICD-10-CM | POA: Diagnosis not present

## 2020-03-09 DIAGNOSIS — C7989 Secondary malignant neoplasm of other specified sites: Secondary | ICD-10-CM | POA: Diagnosis not present

## 2020-03-09 DIAGNOSIS — C7951 Secondary malignant neoplasm of bone: Secondary | ICD-10-CM | POA: Diagnosis not present

## 2020-03-09 DIAGNOSIS — I110 Hypertensive heart disease with heart failure: Secondary | ICD-10-CM | POA: Diagnosis not present

## 2020-03-09 DIAGNOSIS — I82409 Acute embolism and thrombosis of unspecified deep veins of unspecified lower extremity: Secondary | ICD-10-CM | POA: Diagnosis not present

## 2020-03-10 DIAGNOSIS — C7951 Secondary malignant neoplasm of bone: Secondary | ICD-10-CM | POA: Diagnosis not present

## 2020-03-10 DIAGNOSIS — C7989 Secondary malignant neoplasm of other specified sites: Secondary | ICD-10-CM | POA: Diagnosis not present

## 2020-03-10 DIAGNOSIS — I82409 Acute embolism and thrombosis of unspecified deep veins of unspecified lower extremity: Secondary | ICD-10-CM | POA: Diagnosis not present

## 2020-03-10 DIAGNOSIS — I2699 Other pulmonary embolism without acute cor pulmonale: Secondary | ICD-10-CM | POA: Diagnosis not present

## 2020-03-10 DIAGNOSIS — I110 Hypertensive heart disease with heart failure: Secondary | ICD-10-CM | POA: Diagnosis not present

## 2020-03-10 DIAGNOSIS — C541 Malignant neoplasm of endometrium: Secondary | ICD-10-CM | POA: Diagnosis not present

## 2020-03-11 DIAGNOSIS — I2699 Other pulmonary embolism without acute cor pulmonale: Secondary | ICD-10-CM | POA: Diagnosis not present

## 2020-03-11 DIAGNOSIS — C7951 Secondary malignant neoplasm of bone: Secondary | ICD-10-CM | POA: Diagnosis not present

## 2020-03-11 DIAGNOSIS — I82409 Acute embolism and thrombosis of unspecified deep veins of unspecified lower extremity: Secondary | ICD-10-CM | POA: Diagnosis not present

## 2020-03-11 DIAGNOSIS — C541 Malignant neoplasm of endometrium: Secondary | ICD-10-CM | POA: Diagnosis not present

## 2020-03-11 DIAGNOSIS — I110 Hypertensive heart disease with heart failure: Secondary | ICD-10-CM | POA: Diagnosis not present

## 2020-03-11 DIAGNOSIS — C7989 Secondary malignant neoplasm of other specified sites: Secondary | ICD-10-CM | POA: Diagnosis not present

## 2020-03-12 DIAGNOSIS — C7989 Secondary malignant neoplasm of other specified sites: Secondary | ICD-10-CM | POA: Diagnosis not present

## 2020-03-12 DIAGNOSIS — C7951 Secondary malignant neoplasm of bone: Secondary | ICD-10-CM | POA: Diagnosis not present

## 2020-03-12 DIAGNOSIS — I110 Hypertensive heart disease with heart failure: Secondary | ICD-10-CM | POA: Diagnosis not present

## 2020-03-12 DIAGNOSIS — C541 Malignant neoplasm of endometrium: Secondary | ICD-10-CM | POA: Diagnosis not present

## 2020-03-12 DIAGNOSIS — I82409 Acute embolism and thrombosis of unspecified deep veins of unspecified lower extremity: Secondary | ICD-10-CM | POA: Diagnosis not present

## 2020-03-12 DIAGNOSIS — I2699 Other pulmonary embolism without acute cor pulmonale: Secondary | ICD-10-CM | POA: Diagnosis not present

## 2020-03-13 DIAGNOSIS — C541 Malignant neoplasm of endometrium: Secondary | ICD-10-CM | POA: Diagnosis not present

## 2020-03-13 DIAGNOSIS — I2699 Other pulmonary embolism without acute cor pulmonale: Secondary | ICD-10-CM | POA: Diagnosis not present

## 2020-03-13 DIAGNOSIS — I82409 Acute embolism and thrombosis of unspecified deep veins of unspecified lower extremity: Secondary | ICD-10-CM | POA: Diagnosis not present

## 2020-03-13 DIAGNOSIS — I110 Hypertensive heart disease with heart failure: Secondary | ICD-10-CM | POA: Diagnosis not present

## 2020-03-13 DIAGNOSIS — C7951 Secondary malignant neoplasm of bone: Secondary | ICD-10-CM | POA: Diagnosis not present

## 2020-03-13 DIAGNOSIS — C7989 Secondary malignant neoplasm of other specified sites: Secondary | ICD-10-CM | POA: Diagnosis not present

## 2020-03-14 DIAGNOSIS — I2699 Other pulmonary embolism without acute cor pulmonale: Secondary | ICD-10-CM | POA: Diagnosis not present

## 2020-03-14 DIAGNOSIS — C7989 Secondary malignant neoplasm of other specified sites: Secondary | ICD-10-CM | POA: Diagnosis not present

## 2020-03-14 DIAGNOSIS — C7951 Secondary malignant neoplasm of bone: Secondary | ICD-10-CM | POA: Diagnosis not present

## 2020-03-14 DIAGNOSIS — I110 Hypertensive heart disease with heart failure: Secondary | ICD-10-CM | POA: Diagnosis not present

## 2020-03-14 DIAGNOSIS — C541 Malignant neoplasm of endometrium: Secondary | ICD-10-CM | POA: Diagnosis not present

## 2020-03-14 DIAGNOSIS — I82409 Acute embolism and thrombosis of unspecified deep veins of unspecified lower extremity: Secondary | ICD-10-CM | POA: Diagnosis not present

## 2020-03-15 DIAGNOSIS — I2699 Other pulmonary embolism without acute cor pulmonale: Secondary | ICD-10-CM | POA: Diagnosis not present

## 2020-03-15 DIAGNOSIS — C7989 Secondary malignant neoplasm of other specified sites: Secondary | ICD-10-CM | POA: Diagnosis not present

## 2020-03-15 DIAGNOSIS — I82409 Acute embolism and thrombosis of unspecified deep veins of unspecified lower extremity: Secondary | ICD-10-CM | POA: Diagnosis not present

## 2020-03-15 DIAGNOSIS — I110 Hypertensive heart disease with heart failure: Secondary | ICD-10-CM | POA: Diagnosis not present

## 2020-03-15 DIAGNOSIS — C541 Malignant neoplasm of endometrium: Secondary | ICD-10-CM | POA: Diagnosis not present

## 2020-03-15 DIAGNOSIS — C7951 Secondary malignant neoplasm of bone: Secondary | ICD-10-CM | POA: Diagnosis not present

## 2020-03-16 DIAGNOSIS — C7989 Secondary malignant neoplasm of other specified sites: Secondary | ICD-10-CM | POA: Diagnosis not present

## 2020-03-16 DIAGNOSIS — I110 Hypertensive heart disease with heart failure: Secondary | ICD-10-CM | POA: Diagnosis not present

## 2020-03-16 DIAGNOSIS — C541 Malignant neoplasm of endometrium: Secondary | ICD-10-CM | POA: Diagnosis not present

## 2020-03-16 DIAGNOSIS — I2699 Other pulmonary embolism without acute cor pulmonale: Secondary | ICD-10-CM | POA: Diagnosis not present

## 2020-03-16 DIAGNOSIS — C7951 Secondary malignant neoplasm of bone: Secondary | ICD-10-CM | POA: Diagnosis not present

## 2020-03-16 DIAGNOSIS — I82409 Acute embolism and thrombosis of unspecified deep veins of unspecified lower extremity: Secondary | ICD-10-CM | POA: Diagnosis not present

## 2020-03-17 DIAGNOSIS — I82409 Acute embolism and thrombosis of unspecified deep veins of unspecified lower extremity: Secondary | ICD-10-CM | POA: Diagnosis not present

## 2020-03-17 DIAGNOSIS — C7989 Secondary malignant neoplasm of other specified sites: Secondary | ICD-10-CM | POA: Diagnosis not present

## 2020-03-17 DIAGNOSIS — C541 Malignant neoplasm of endometrium: Secondary | ICD-10-CM | POA: Diagnosis not present

## 2020-03-17 DIAGNOSIS — I2699 Other pulmonary embolism without acute cor pulmonale: Secondary | ICD-10-CM | POA: Diagnosis not present

## 2020-03-17 DIAGNOSIS — I110 Hypertensive heart disease with heart failure: Secondary | ICD-10-CM | POA: Diagnosis not present

## 2020-03-17 DIAGNOSIS — C7951 Secondary malignant neoplasm of bone: Secondary | ICD-10-CM | POA: Diagnosis not present

## 2020-03-18 DIAGNOSIS — I2699 Other pulmonary embolism without acute cor pulmonale: Secondary | ICD-10-CM | POA: Diagnosis not present

## 2020-03-18 DIAGNOSIS — C541 Malignant neoplasm of endometrium: Secondary | ICD-10-CM | POA: Diagnosis not present

## 2020-03-18 DIAGNOSIS — C7989 Secondary malignant neoplasm of other specified sites: Secondary | ICD-10-CM | POA: Diagnosis not present

## 2020-03-18 DIAGNOSIS — I110 Hypertensive heart disease with heart failure: Secondary | ICD-10-CM | POA: Diagnosis not present

## 2020-03-18 DIAGNOSIS — I82409 Acute embolism and thrombosis of unspecified deep veins of unspecified lower extremity: Secondary | ICD-10-CM | POA: Diagnosis not present

## 2020-03-18 DIAGNOSIS — C7951 Secondary malignant neoplasm of bone: Secondary | ICD-10-CM | POA: Diagnosis not present

## 2020-03-19 DIAGNOSIS — C7989 Secondary malignant neoplasm of other specified sites: Secondary | ICD-10-CM | POA: Diagnosis not present

## 2020-03-19 DIAGNOSIS — I82409 Acute embolism and thrombosis of unspecified deep veins of unspecified lower extremity: Secondary | ICD-10-CM | POA: Diagnosis not present

## 2020-03-19 DIAGNOSIS — C541 Malignant neoplasm of endometrium: Secondary | ICD-10-CM | POA: Diagnosis not present

## 2020-03-19 DIAGNOSIS — I110 Hypertensive heart disease with heart failure: Secondary | ICD-10-CM | POA: Diagnosis not present

## 2020-03-19 DIAGNOSIS — C7951 Secondary malignant neoplasm of bone: Secondary | ICD-10-CM | POA: Diagnosis not present

## 2020-03-19 DIAGNOSIS — I2699 Other pulmonary embolism without acute cor pulmonale: Secondary | ICD-10-CM | POA: Diagnosis not present

## 2020-03-20 DIAGNOSIS — I2699 Other pulmonary embolism without acute cor pulmonale: Secondary | ICD-10-CM | POA: Diagnosis not present

## 2020-03-20 DIAGNOSIS — C7989 Secondary malignant neoplasm of other specified sites: Secondary | ICD-10-CM | POA: Diagnosis not present

## 2020-03-20 DIAGNOSIS — C541 Malignant neoplasm of endometrium: Secondary | ICD-10-CM | POA: Diagnosis not present

## 2020-03-20 DIAGNOSIS — I82409 Acute embolism and thrombosis of unspecified deep veins of unspecified lower extremity: Secondary | ICD-10-CM | POA: Diagnosis not present

## 2020-03-20 DIAGNOSIS — I110 Hypertensive heart disease with heart failure: Secondary | ICD-10-CM | POA: Diagnosis not present

## 2020-03-20 DIAGNOSIS — C7951 Secondary malignant neoplasm of bone: Secondary | ICD-10-CM | POA: Diagnosis not present

## 2020-03-21 DIAGNOSIS — C541 Malignant neoplasm of endometrium: Secondary | ICD-10-CM | POA: Diagnosis not present

## 2020-03-21 DIAGNOSIS — I2699 Other pulmonary embolism without acute cor pulmonale: Secondary | ICD-10-CM | POA: Diagnosis not present

## 2020-03-21 DIAGNOSIS — I110 Hypertensive heart disease with heart failure: Secondary | ICD-10-CM | POA: Diagnosis not present

## 2020-03-21 DIAGNOSIS — C7989 Secondary malignant neoplasm of other specified sites: Secondary | ICD-10-CM | POA: Diagnosis not present

## 2020-03-21 DIAGNOSIS — I82409 Acute embolism and thrombosis of unspecified deep veins of unspecified lower extremity: Secondary | ICD-10-CM | POA: Diagnosis not present

## 2020-03-21 DIAGNOSIS — C7951 Secondary malignant neoplasm of bone: Secondary | ICD-10-CM | POA: Diagnosis not present

## 2020-03-22 DIAGNOSIS — C7951 Secondary malignant neoplasm of bone: Secondary | ICD-10-CM | POA: Diagnosis not present

## 2020-03-22 DIAGNOSIS — C541 Malignant neoplasm of endometrium: Secondary | ICD-10-CM | POA: Diagnosis not present

## 2020-03-22 DIAGNOSIS — I82409 Acute embolism and thrombosis of unspecified deep veins of unspecified lower extremity: Secondary | ICD-10-CM | POA: Diagnosis not present

## 2020-03-22 DIAGNOSIS — I110 Hypertensive heart disease with heart failure: Secondary | ICD-10-CM | POA: Diagnosis not present

## 2020-03-22 DIAGNOSIS — I2699 Other pulmonary embolism without acute cor pulmonale: Secondary | ICD-10-CM | POA: Diagnosis not present

## 2020-03-22 DIAGNOSIS — C7989 Secondary malignant neoplasm of other specified sites: Secondary | ICD-10-CM | POA: Diagnosis not present

## 2020-03-23 DIAGNOSIS — I509 Heart failure, unspecified: Secondary | ICD-10-CM | POA: Diagnosis not present

## 2020-03-23 DIAGNOSIS — Z681 Body mass index (BMI) 19 or less, adult: Secondary | ICD-10-CM | POA: Diagnosis not present

## 2020-03-23 DIAGNOSIS — C7989 Secondary malignant neoplasm of other specified sites: Secondary | ICD-10-CM | POA: Diagnosis not present

## 2020-03-23 DIAGNOSIS — K5909 Other constipation: Secondary | ICD-10-CM | POA: Diagnosis not present

## 2020-03-23 DIAGNOSIS — Z9221 Personal history of antineoplastic chemotherapy: Secondary | ICD-10-CM | POA: Diagnosis not present

## 2020-03-23 DIAGNOSIS — N1330 Unspecified hydronephrosis: Secondary | ICD-10-CM | POA: Diagnosis not present

## 2020-03-23 DIAGNOSIS — I110 Hypertensive heart disease with heart failure: Secondary | ICD-10-CM | POA: Diagnosis not present

## 2020-03-23 DIAGNOSIS — Z7901 Long term (current) use of anticoagulants: Secondary | ICD-10-CM | POA: Diagnosis not present

## 2020-03-23 DIAGNOSIS — E039 Hypothyroidism, unspecified: Secondary | ICD-10-CM | POA: Diagnosis not present

## 2020-03-23 DIAGNOSIS — Z90710 Acquired absence of both cervix and uterus: Secondary | ICD-10-CM | POA: Diagnosis not present

## 2020-03-23 DIAGNOSIS — C7951 Secondary malignant neoplasm of bone: Secondary | ICD-10-CM | POA: Diagnosis not present

## 2020-03-23 DIAGNOSIS — Z923 Personal history of irradiation: Secondary | ICD-10-CM | POA: Diagnosis not present

## 2020-03-23 DIAGNOSIS — R627 Adult failure to thrive: Secondary | ICD-10-CM | POA: Diagnosis not present

## 2020-03-23 DIAGNOSIS — M1990 Unspecified osteoarthritis, unspecified site: Secondary | ICD-10-CM | POA: Diagnosis not present

## 2020-03-23 DIAGNOSIS — E785 Hyperlipidemia, unspecified: Secondary | ICD-10-CM | POA: Diagnosis not present

## 2020-03-23 DIAGNOSIS — C541 Malignant neoplasm of endometrium: Secondary | ICD-10-CM | POA: Diagnosis not present

## 2020-03-23 DIAGNOSIS — I82409 Acute embolism and thrombosis of unspecified deep veins of unspecified lower extremity: Secondary | ICD-10-CM | POA: Diagnosis not present

## 2020-03-23 DIAGNOSIS — I2699 Other pulmonary embolism without acute cor pulmonale: Secondary | ICD-10-CM | POA: Diagnosis not present

## 2020-03-24 DIAGNOSIS — C7989 Secondary malignant neoplasm of other specified sites: Secondary | ICD-10-CM | POA: Diagnosis not present

## 2020-03-24 DIAGNOSIS — I110 Hypertensive heart disease with heart failure: Secondary | ICD-10-CM | POA: Diagnosis not present

## 2020-03-24 DIAGNOSIS — I2699 Other pulmonary embolism without acute cor pulmonale: Secondary | ICD-10-CM | POA: Diagnosis not present

## 2020-03-24 DIAGNOSIS — I82409 Acute embolism and thrombosis of unspecified deep veins of unspecified lower extremity: Secondary | ICD-10-CM | POA: Diagnosis not present

## 2020-03-24 DIAGNOSIS — C541 Malignant neoplasm of endometrium: Secondary | ICD-10-CM | POA: Diagnosis not present

## 2020-03-24 DIAGNOSIS — C7951 Secondary malignant neoplasm of bone: Secondary | ICD-10-CM | POA: Diagnosis not present

## 2020-03-25 DIAGNOSIS — I2699 Other pulmonary embolism without acute cor pulmonale: Secondary | ICD-10-CM | POA: Diagnosis not present

## 2020-03-25 DIAGNOSIS — I82409 Acute embolism and thrombosis of unspecified deep veins of unspecified lower extremity: Secondary | ICD-10-CM | POA: Diagnosis not present

## 2020-03-25 DIAGNOSIS — C541 Malignant neoplasm of endometrium: Secondary | ICD-10-CM | POA: Diagnosis not present

## 2020-03-25 DIAGNOSIS — I110 Hypertensive heart disease with heart failure: Secondary | ICD-10-CM | POA: Diagnosis not present

## 2020-03-25 DIAGNOSIS — C7951 Secondary malignant neoplasm of bone: Secondary | ICD-10-CM | POA: Diagnosis not present

## 2020-03-25 DIAGNOSIS — C7989 Secondary malignant neoplasm of other specified sites: Secondary | ICD-10-CM | POA: Diagnosis not present

## 2020-03-26 DIAGNOSIS — C7989 Secondary malignant neoplasm of other specified sites: Secondary | ICD-10-CM | POA: Diagnosis not present

## 2020-03-26 DIAGNOSIS — I82409 Acute embolism and thrombosis of unspecified deep veins of unspecified lower extremity: Secondary | ICD-10-CM | POA: Diagnosis not present

## 2020-03-26 DIAGNOSIS — C7951 Secondary malignant neoplasm of bone: Secondary | ICD-10-CM | POA: Diagnosis not present

## 2020-03-26 DIAGNOSIS — I2699 Other pulmonary embolism without acute cor pulmonale: Secondary | ICD-10-CM | POA: Diagnosis not present

## 2020-03-26 DIAGNOSIS — C541 Malignant neoplasm of endometrium: Secondary | ICD-10-CM | POA: Diagnosis not present

## 2020-03-26 DIAGNOSIS — I110 Hypertensive heart disease with heart failure: Secondary | ICD-10-CM | POA: Diagnosis not present

## 2020-03-27 DIAGNOSIS — C541 Malignant neoplasm of endometrium: Secondary | ICD-10-CM | POA: Diagnosis not present

## 2020-03-27 DIAGNOSIS — I82409 Acute embolism and thrombosis of unspecified deep veins of unspecified lower extremity: Secondary | ICD-10-CM | POA: Diagnosis not present

## 2020-03-27 DIAGNOSIS — C7989 Secondary malignant neoplasm of other specified sites: Secondary | ICD-10-CM | POA: Diagnosis not present

## 2020-03-27 DIAGNOSIS — I2699 Other pulmonary embolism without acute cor pulmonale: Secondary | ICD-10-CM | POA: Diagnosis not present

## 2020-03-27 DIAGNOSIS — I110 Hypertensive heart disease with heart failure: Secondary | ICD-10-CM | POA: Diagnosis not present

## 2020-03-27 DIAGNOSIS — C7951 Secondary malignant neoplasm of bone: Secondary | ICD-10-CM | POA: Diagnosis not present

## 2020-03-28 DIAGNOSIS — C7951 Secondary malignant neoplasm of bone: Secondary | ICD-10-CM | POA: Diagnosis not present

## 2020-03-28 DIAGNOSIS — I110 Hypertensive heart disease with heart failure: Secondary | ICD-10-CM | POA: Diagnosis not present

## 2020-03-28 DIAGNOSIS — C541 Malignant neoplasm of endometrium: Secondary | ICD-10-CM | POA: Diagnosis not present

## 2020-03-28 DIAGNOSIS — I2699 Other pulmonary embolism without acute cor pulmonale: Secondary | ICD-10-CM | POA: Diagnosis not present

## 2020-03-28 DIAGNOSIS — I82409 Acute embolism and thrombosis of unspecified deep veins of unspecified lower extremity: Secondary | ICD-10-CM | POA: Diagnosis not present

## 2020-03-28 DIAGNOSIS — C7989 Secondary malignant neoplasm of other specified sites: Secondary | ICD-10-CM | POA: Diagnosis not present

## 2020-03-29 DIAGNOSIS — C7989 Secondary malignant neoplasm of other specified sites: Secondary | ICD-10-CM | POA: Diagnosis not present

## 2020-03-29 DIAGNOSIS — I82409 Acute embolism and thrombosis of unspecified deep veins of unspecified lower extremity: Secondary | ICD-10-CM | POA: Diagnosis not present

## 2020-03-29 DIAGNOSIS — I2699 Other pulmonary embolism without acute cor pulmonale: Secondary | ICD-10-CM | POA: Diagnosis not present

## 2020-03-29 DIAGNOSIS — I110 Hypertensive heart disease with heart failure: Secondary | ICD-10-CM | POA: Diagnosis not present

## 2020-03-29 DIAGNOSIS — C7951 Secondary malignant neoplasm of bone: Secondary | ICD-10-CM | POA: Diagnosis not present

## 2020-03-29 DIAGNOSIS — C541 Malignant neoplasm of endometrium: Secondary | ICD-10-CM | POA: Diagnosis not present

## 2020-03-30 DIAGNOSIS — C541 Malignant neoplasm of endometrium: Secondary | ICD-10-CM | POA: Diagnosis not present

## 2020-03-30 DIAGNOSIS — C7989 Secondary malignant neoplasm of other specified sites: Secondary | ICD-10-CM | POA: Diagnosis not present

## 2020-03-30 DIAGNOSIS — C7951 Secondary malignant neoplasm of bone: Secondary | ICD-10-CM | POA: Diagnosis not present

## 2020-03-30 DIAGNOSIS — I2699 Other pulmonary embolism without acute cor pulmonale: Secondary | ICD-10-CM | POA: Diagnosis not present

## 2020-03-30 DIAGNOSIS — I110 Hypertensive heart disease with heart failure: Secondary | ICD-10-CM | POA: Diagnosis not present

## 2020-03-30 DIAGNOSIS — I82409 Acute embolism and thrombosis of unspecified deep veins of unspecified lower extremity: Secondary | ICD-10-CM | POA: Diagnosis not present

## 2020-03-31 DIAGNOSIS — I82409 Acute embolism and thrombosis of unspecified deep veins of unspecified lower extremity: Secondary | ICD-10-CM | POA: Diagnosis not present

## 2020-03-31 DIAGNOSIS — C7951 Secondary malignant neoplasm of bone: Secondary | ICD-10-CM | POA: Diagnosis not present

## 2020-03-31 DIAGNOSIS — C7989 Secondary malignant neoplasm of other specified sites: Secondary | ICD-10-CM | POA: Diagnosis not present

## 2020-03-31 DIAGNOSIS — I2699 Other pulmonary embolism without acute cor pulmonale: Secondary | ICD-10-CM | POA: Diagnosis not present

## 2020-03-31 DIAGNOSIS — I110 Hypertensive heart disease with heart failure: Secondary | ICD-10-CM | POA: Diagnosis not present

## 2020-03-31 DIAGNOSIS — C541 Malignant neoplasm of endometrium: Secondary | ICD-10-CM | POA: Diagnosis not present

## 2020-04-01 DIAGNOSIS — C7989 Secondary malignant neoplasm of other specified sites: Secondary | ICD-10-CM | POA: Diagnosis not present

## 2020-04-01 DIAGNOSIS — I82409 Acute embolism and thrombosis of unspecified deep veins of unspecified lower extremity: Secondary | ICD-10-CM | POA: Diagnosis not present

## 2020-04-01 DIAGNOSIS — I2699 Other pulmonary embolism without acute cor pulmonale: Secondary | ICD-10-CM | POA: Diagnosis not present

## 2020-04-01 DIAGNOSIS — C541 Malignant neoplasm of endometrium: Secondary | ICD-10-CM | POA: Diagnosis not present

## 2020-04-01 DIAGNOSIS — I110 Hypertensive heart disease with heart failure: Secondary | ICD-10-CM | POA: Diagnosis not present

## 2020-04-01 DIAGNOSIS — C7951 Secondary malignant neoplasm of bone: Secondary | ICD-10-CM | POA: Diagnosis not present

## 2020-04-02 DIAGNOSIS — C7989 Secondary malignant neoplasm of other specified sites: Secondary | ICD-10-CM | POA: Diagnosis not present

## 2020-04-02 DIAGNOSIS — C541 Malignant neoplasm of endometrium: Secondary | ICD-10-CM | POA: Diagnosis not present

## 2020-04-02 DIAGNOSIS — I110 Hypertensive heart disease with heart failure: Secondary | ICD-10-CM | POA: Diagnosis not present

## 2020-04-02 DIAGNOSIS — I82409 Acute embolism and thrombosis of unspecified deep veins of unspecified lower extremity: Secondary | ICD-10-CM | POA: Diagnosis not present

## 2020-04-02 DIAGNOSIS — I2699 Other pulmonary embolism without acute cor pulmonale: Secondary | ICD-10-CM | POA: Diagnosis not present

## 2020-04-02 DIAGNOSIS — C7951 Secondary malignant neoplasm of bone: Secondary | ICD-10-CM | POA: Diagnosis not present

## 2020-04-03 DIAGNOSIS — C7989 Secondary malignant neoplasm of other specified sites: Secondary | ICD-10-CM | POA: Diagnosis not present

## 2020-04-03 DIAGNOSIS — C7951 Secondary malignant neoplasm of bone: Secondary | ICD-10-CM | POA: Diagnosis not present

## 2020-04-03 DIAGNOSIS — I2699 Other pulmonary embolism without acute cor pulmonale: Secondary | ICD-10-CM | POA: Diagnosis not present

## 2020-04-03 DIAGNOSIS — I110 Hypertensive heart disease with heart failure: Secondary | ICD-10-CM | POA: Diagnosis not present

## 2020-04-03 DIAGNOSIS — I82409 Acute embolism and thrombosis of unspecified deep veins of unspecified lower extremity: Secondary | ICD-10-CM | POA: Diagnosis not present

## 2020-04-03 DIAGNOSIS — C541 Malignant neoplasm of endometrium: Secondary | ICD-10-CM | POA: Diagnosis not present

## 2020-04-04 DIAGNOSIS — I82409 Acute embolism and thrombosis of unspecified deep veins of unspecified lower extremity: Secondary | ICD-10-CM | POA: Diagnosis not present

## 2020-04-04 DIAGNOSIS — C7951 Secondary malignant neoplasm of bone: Secondary | ICD-10-CM | POA: Diagnosis not present

## 2020-04-04 DIAGNOSIS — I110 Hypertensive heart disease with heart failure: Secondary | ICD-10-CM | POA: Diagnosis not present

## 2020-04-04 DIAGNOSIS — I2699 Other pulmonary embolism without acute cor pulmonale: Secondary | ICD-10-CM | POA: Diagnosis not present

## 2020-04-04 DIAGNOSIS — C7989 Secondary malignant neoplasm of other specified sites: Secondary | ICD-10-CM | POA: Diagnosis not present

## 2020-04-04 DIAGNOSIS — C541 Malignant neoplasm of endometrium: Secondary | ICD-10-CM | POA: Diagnosis not present

## 2020-04-05 DIAGNOSIS — I82409 Acute embolism and thrombosis of unspecified deep veins of unspecified lower extremity: Secondary | ICD-10-CM | POA: Diagnosis not present

## 2020-04-05 DIAGNOSIS — C7951 Secondary malignant neoplasm of bone: Secondary | ICD-10-CM | POA: Diagnosis not present

## 2020-04-05 DIAGNOSIS — C7989 Secondary malignant neoplasm of other specified sites: Secondary | ICD-10-CM | POA: Diagnosis not present

## 2020-04-05 DIAGNOSIS — I2699 Other pulmonary embolism without acute cor pulmonale: Secondary | ICD-10-CM | POA: Diagnosis not present

## 2020-04-05 DIAGNOSIS — C541 Malignant neoplasm of endometrium: Secondary | ICD-10-CM | POA: Diagnosis not present

## 2020-04-05 DIAGNOSIS — I110 Hypertensive heart disease with heart failure: Secondary | ICD-10-CM | POA: Diagnosis not present

## 2020-04-06 DIAGNOSIS — I82409 Acute embolism and thrombosis of unspecified deep veins of unspecified lower extremity: Secondary | ICD-10-CM | POA: Diagnosis not present

## 2020-04-06 DIAGNOSIS — I2699 Other pulmonary embolism without acute cor pulmonale: Secondary | ICD-10-CM | POA: Diagnosis not present

## 2020-04-06 DIAGNOSIS — C7951 Secondary malignant neoplasm of bone: Secondary | ICD-10-CM | POA: Diagnosis not present

## 2020-04-06 DIAGNOSIS — C541 Malignant neoplasm of endometrium: Secondary | ICD-10-CM | POA: Diagnosis not present

## 2020-04-06 DIAGNOSIS — I110 Hypertensive heart disease with heart failure: Secondary | ICD-10-CM | POA: Diagnosis not present

## 2020-04-06 DIAGNOSIS — C7989 Secondary malignant neoplasm of other specified sites: Secondary | ICD-10-CM | POA: Diagnosis not present

## 2020-04-07 DIAGNOSIS — C541 Malignant neoplasm of endometrium: Secondary | ICD-10-CM | POA: Diagnosis not present

## 2020-04-07 DIAGNOSIS — I2699 Other pulmonary embolism without acute cor pulmonale: Secondary | ICD-10-CM | POA: Diagnosis not present

## 2020-04-07 DIAGNOSIS — C7951 Secondary malignant neoplasm of bone: Secondary | ICD-10-CM | POA: Diagnosis not present

## 2020-04-07 DIAGNOSIS — C7989 Secondary malignant neoplasm of other specified sites: Secondary | ICD-10-CM | POA: Diagnosis not present

## 2020-04-07 DIAGNOSIS — I110 Hypertensive heart disease with heart failure: Secondary | ICD-10-CM | POA: Diagnosis not present

## 2020-04-07 DIAGNOSIS — I82409 Acute embolism and thrombosis of unspecified deep veins of unspecified lower extremity: Secondary | ICD-10-CM | POA: Diagnosis not present

## 2020-04-08 DIAGNOSIS — C7951 Secondary malignant neoplasm of bone: Secondary | ICD-10-CM | POA: Diagnosis not present

## 2020-04-08 DIAGNOSIS — C7989 Secondary malignant neoplasm of other specified sites: Secondary | ICD-10-CM | POA: Diagnosis not present

## 2020-04-08 DIAGNOSIS — I110 Hypertensive heart disease with heart failure: Secondary | ICD-10-CM | POA: Diagnosis not present

## 2020-04-08 DIAGNOSIS — I2699 Other pulmonary embolism without acute cor pulmonale: Secondary | ICD-10-CM | POA: Diagnosis not present

## 2020-04-08 DIAGNOSIS — C541 Malignant neoplasm of endometrium: Secondary | ICD-10-CM | POA: Diagnosis not present

## 2020-04-08 DIAGNOSIS — I82409 Acute embolism and thrombosis of unspecified deep veins of unspecified lower extremity: Secondary | ICD-10-CM | POA: Diagnosis not present

## 2020-04-09 DIAGNOSIS — I2699 Other pulmonary embolism without acute cor pulmonale: Secondary | ICD-10-CM | POA: Diagnosis not present

## 2020-04-09 DIAGNOSIS — C541 Malignant neoplasm of endometrium: Secondary | ICD-10-CM | POA: Diagnosis not present

## 2020-04-09 DIAGNOSIS — C7989 Secondary malignant neoplasm of other specified sites: Secondary | ICD-10-CM | POA: Diagnosis not present

## 2020-04-09 DIAGNOSIS — I110 Hypertensive heart disease with heart failure: Secondary | ICD-10-CM | POA: Diagnosis not present

## 2020-04-09 DIAGNOSIS — I82409 Acute embolism and thrombosis of unspecified deep veins of unspecified lower extremity: Secondary | ICD-10-CM | POA: Diagnosis not present

## 2020-04-09 DIAGNOSIS — C7951 Secondary malignant neoplasm of bone: Secondary | ICD-10-CM | POA: Diagnosis not present

## 2020-04-10 DIAGNOSIS — I2699 Other pulmonary embolism without acute cor pulmonale: Secondary | ICD-10-CM | POA: Diagnosis not present

## 2020-04-10 DIAGNOSIS — I82409 Acute embolism and thrombosis of unspecified deep veins of unspecified lower extremity: Secondary | ICD-10-CM | POA: Diagnosis not present

## 2020-04-10 DIAGNOSIS — I110 Hypertensive heart disease with heart failure: Secondary | ICD-10-CM | POA: Diagnosis not present

## 2020-04-10 DIAGNOSIS — C7951 Secondary malignant neoplasm of bone: Secondary | ICD-10-CM | POA: Diagnosis not present

## 2020-04-10 DIAGNOSIS — C541 Malignant neoplasm of endometrium: Secondary | ICD-10-CM | POA: Diagnosis not present

## 2020-04-10 DIAGNOSIS — C7989 Secondary malignant neoplasm of other specified sites: Secondary | ICD-10-CM | POA: Diagnosis not present

## 2020-04-11 DIAGNOSIS — I110 Hypertensive heart disease with heart failure: Secondary | ICD-10-CM | POA: Diagnosis not present

## 2020-04-11 DIAGNOSIS — I82409 Acute embolism and thrombosis of unspecified deep veins of unspecified lower extremity: Secondary | ICD-10-CM | POA: Diagnosis not present

## 2020-04-11 DIAGNOSIS — C7989 Secondary malignant neoplasm of other specified sites: Secondary | ICD-10-CM | POA: Diagnosis not present

## 2020-04-11 DIAGNOSIS — I2699 Other pulmonary embolism without acute cor pulmonale: Secondary | ICD-10-CM | POA: Diagnosis not present

## 2020-04-11 DIAGNOSIS — C541 Malignant neoplasm of endometrium: Secondary | ICD-10-CM | POA: Diagnosis not present

## 2020-04-11 DIAGNOSIS — C7951 Secondary malignant neoplasm of bone: Secondary | ICD-10-CM | POA: Diagnosis not present

## 2020-04-12 DIAGNOSIS — I110 Hypertensive heart disease with heart failure: Secondary | ICD-10-CM | POA: Diagnosis not present

## 2020-04-12 DIAGNOSIS — C7951 Secondary malignant neoplasm of bone: Secondary | ICD-10-CM | POA: Diagnosis not present

## 2020-04-12 DIAGNOSIS — C541 Malignant neoplasm of endometrium: Secondary | ICD-10-CM | POA: Diagnosis not present

## 2020-04-12 DIAGNOSIS — I2699 Other pulmonary embolism without acute cor pulmonale: Secondary | ICD-10-CM | POA: Diagnosis not present

## 2020-04-12 DIAGNOSIS — C7989 Secondary malignant neoplasm of other specified sites: Secondary | ICD-10-CM | POA: Diagnosis not present

## 2020-04-12 DIAGNOSIS — I82409 Acute embolism and thrombosis of unspecified deep veins of unspecified lower extremity: Secondary | ICD-10-CM | POA: Diagnosis not present

## 2020-04-13 DIAGNOSIS — C541 Malignant neoplasm of endometrium: Secondary | ICD-10-CM | POA: Diagnosis not present

## 2020-04-13 DIAGNOSIS — C7951 Secondary malignant neoplasm of bone: Secondary | ICD-10-CM | POA: Diagnosis not present

## 2020-04-13 DIAGNOSIS — C7989 Secondary malignant neoplasm of other specified sites: Secondary | ICD-10-CM | POA: Diagnosis not present

## 2020-04-13 DIAGNOSIS — I2699 Other pulmonary embolism without acute cor pulmonale: Secondary | ICD-10-CM | POA: Diagnosis not present

## 2020-04-13 DIAGNOSIS — I82409 Acute embolism and thrombosis of unspecified deep veins of unspecified lower extremity: Secondary | ICD-10-CM | POA: Diagnosis not present

## 2020-04-13 DIAGNOSIS — I110 Hypertensive heart disease with heart failure: Secondary | ICD-10-CM | POA: Diagnosis not present

## 2020-04-14 DIAGNOSIS — I82409 Acute embolism and thrombosis of unspecified deep veins of unspecified lower extremity: Secondary | ICD-10-CM | POA: Diagnosis not present

## 2020-04-14 DIAGNOSIS — C7989 Secondary malignant neoplasm of other specified sites: Secondary | ICD-10-CM | POA: Diagnosis not present

## 2020-04-14 DIAGNOSIS — I2699 Other pulmonary embolism without acute cor pulmonale: Secondary | ICD-10-CM | POA: Diagnosis not present

## 2020-04-14 DIAGNOSIS — C7951 Secondary malignant neoplasm of bone: Secondary | ICD-10-CM | POA: Diagnosis not present

## 2020-04-14 DIAGNOSIS — I110 Hypertensive heart disease with heart failure: Secondary | ICD-10-CM | POA: Diagnosis not present

## 2020-04-14 DIAGNOSIS — C541 Malignant neoplasm of endometrium: Secondary | ICD-10-CM | POA: Diagnosis not present

## 2020-04-15 DIAGNOSIS — C541 Malignant neoplasm of endometrium: Secondary | ICD-10-CM | POA: Diagnosis not present

## 2020-04-15 DIAGNOSIS — I82409 Acute embolism and thrombosis of unspecified deep veins of unspecified lower extremity: Secondary | ICD-10-CM | POA: Diagnosis not present

## 2020-04-15 DIAGNOSIS — C7951 Secondary malignant neoplasm of bone: Secondary | ICD-10-CM | POA: Diagnosis not present

## 2020-04-15 DIAGNOSIS — I2699 Other pulmonary embolism without acute cor pulmonale: Secondary | ICD-10-CM | POA: Diagnosis not present

## 2020-04-15 DIAGNOSIS — I110 Hypertensive heart disease with heart failure: Secondary | ICD-10-CM | POA: Diagnosis not present

## 2020-04-15 DIAGNOSIS — C7989 Secondary malignant neoplasm of other specified sites: Secondary | ICD-10-CM | POA: Diagnosis not present

## 2020-04-16 DIAGNOSIS — I110 Hypertensive heart disease with heart failure: Secondary | ICD-10-CM | POA: Diagnosis not present

## 2020-04-16 DIAGNOSIS — I82409 Acute embolism and thrombosis of unspecified deep veins of unspecified lower extremity: Secondary | ICD-10-CM | POA: Diagnosis not present

## 2020-04-16 DIAGNOSIS — C541 Malignant neoplasm of endometrium: Secondary | ICD-10-CM | POA: Diagnosis not present

## 2020-04-16 DIAGNOSIS — C7951 Secondary malignant neoplasm of bone: Secondary | ICD-10-CM | POA: Diagnosis not present

## 2020-04-16 DIAGNOSIS — C7989 Secondary malignant neoplasm of other specified sites: Secondary | ICD-10-CM | POA: Diagnosis not present

## 2020-04-16 DIAGNOSIS — I2699 Other pulmonary embolism without acute cor pulmonale: Secondary | ICD-10-CM | POA: Diagnosis not present

## 2020-04-23 DEATH — deceased

## 2021-09-28 IMAGING — CT CT ABD-PELV W/ CM
2 of 5 series · 15 of 46 positions shown, 17 images · IV contrast (omnipaque)
Comparison: 12/05/2018 CT abdomen/pelvis.

CLINICAL DATA: Endometrial cancer status post TAHBSO 02/26/2016,
chemotherapy and radiation therapy. Ongoing chemotherapy. Restaging.

EXAM:
CT ABDOMEN AND PELVIS WITH CONTRAST
TECHNIQUE: Multidetector CT imaging of the abdomen and pelvis was performed
using the standard protocol following bolus administration of
intravenous contrast.
CONTRAST:  75mL OMNIPAQUE IOHEXOL 300 MG/ML  SOLN

[Series 2: abd pelvis 5.00 · axial · 0.65mm/px · z∈[-1467,-1117]mm · 12 of 80 slices shown, 14 images]
[im 5/80  soft-tissue]
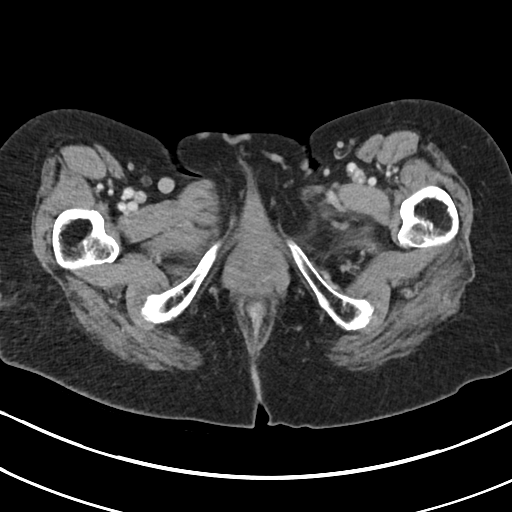
[im 5/80  bone]
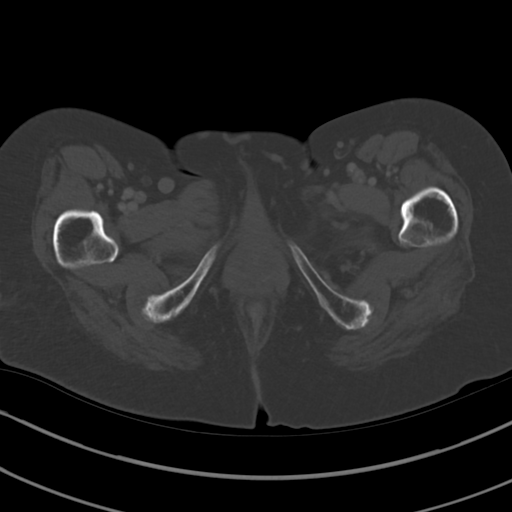
[im 14/80  soft-tissue]
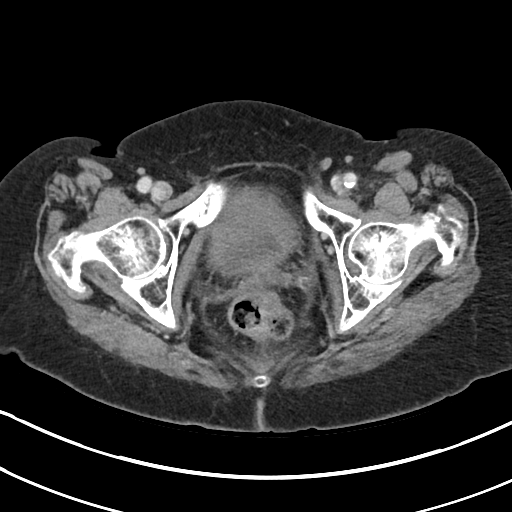
[im 18/80  soft-tissue]
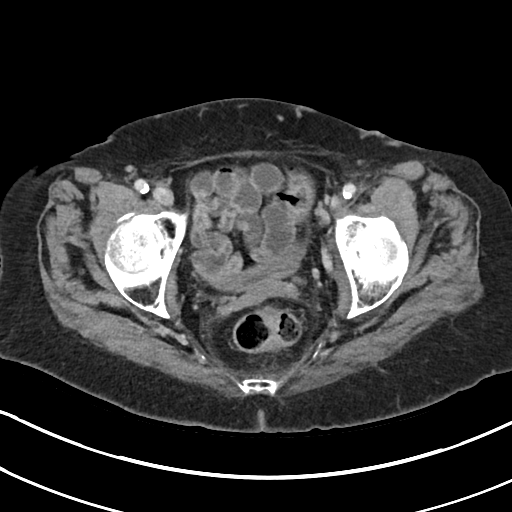
[im 22/80  soft-tissue]
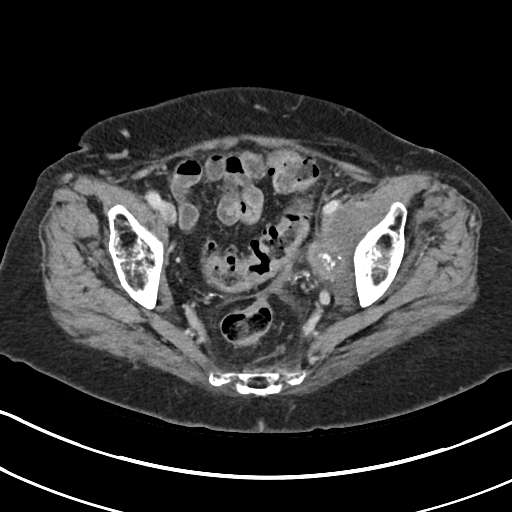
[im 31/80  soft-tissue]
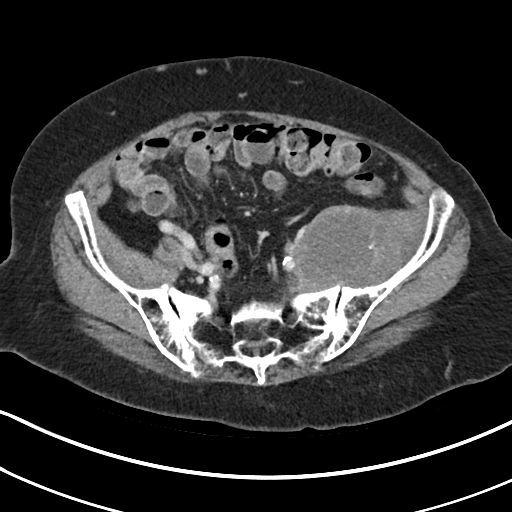
[im 36/80  soft-tissue]
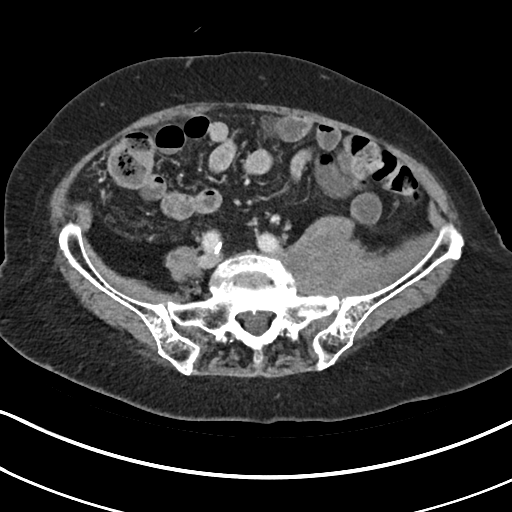
[im 44/80  soft-tissue]
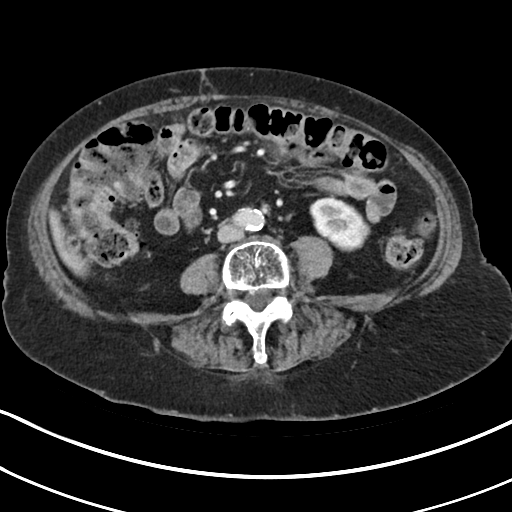
[im 49/80  soft-tissue]
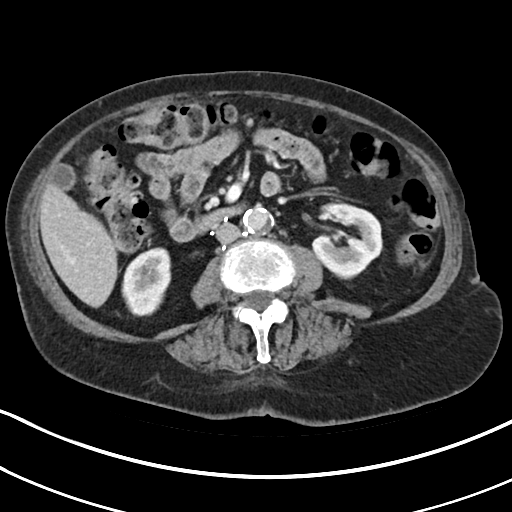
[im 58/80  soft-tissue]
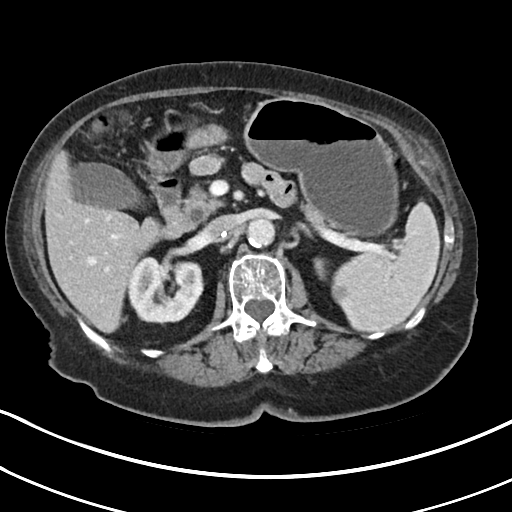
[im 58/80  bone]
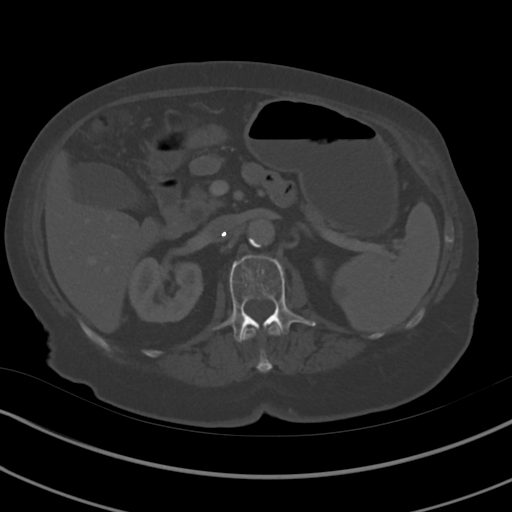
[im 62/80  soft-tissue]
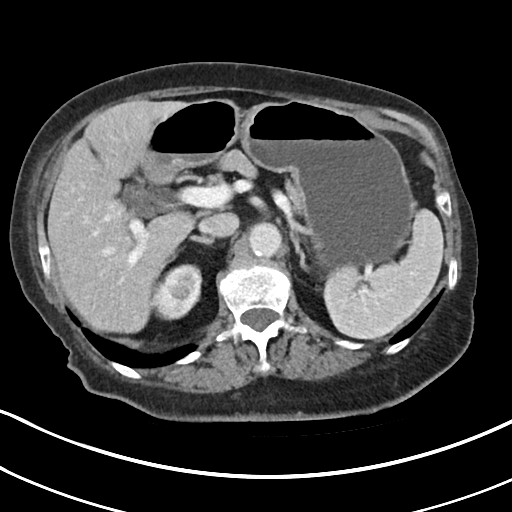
[im 66/80  soft-tissue]
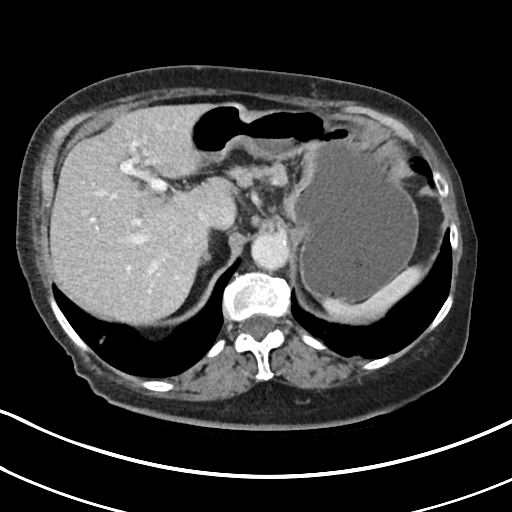
[im 75/80  soft-tissue]
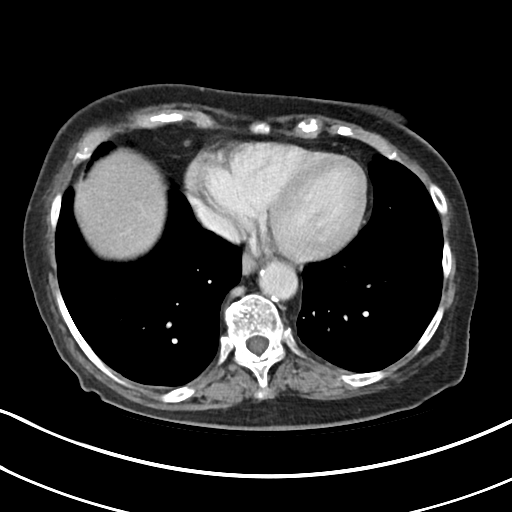

[Series 5: coronals abd pelvis 2.00 cor · coronal · 0.62mm/px · 3 of 126 slices shown]
[im 42/126  soft-tissue]
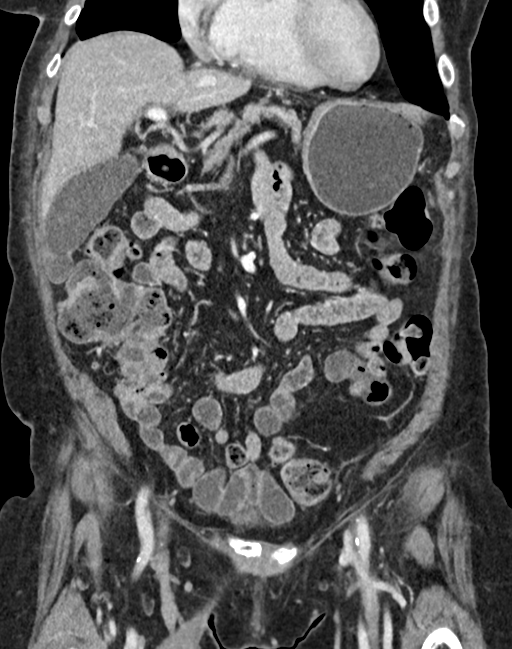
[im 56/126  soft-tissue]
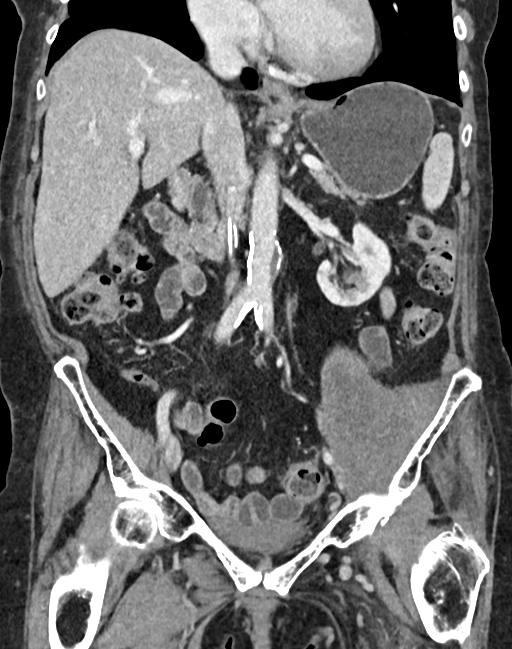
[im 70/126  soft-tissue]
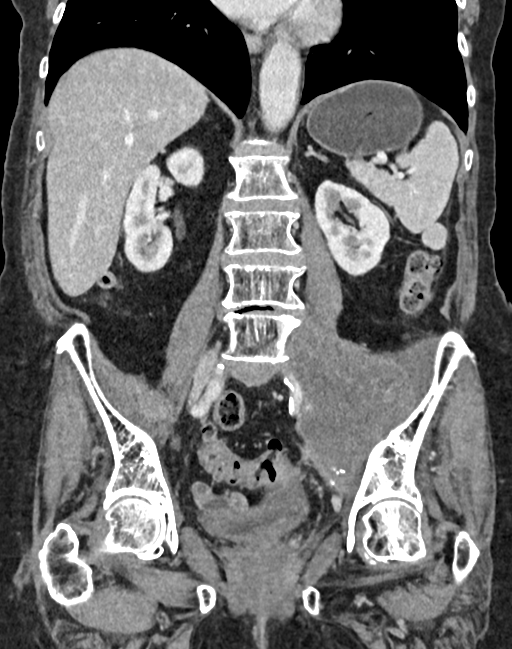

[15 of 46 positions shown; findings below may reference images not displayed]

FINDINGS: Lower chest: Stable small parenchymal bands at the dependent lung
bases bilaterally. No acute abnormality at the lung bases.

Hepatobiliary: Normal liver size. No liver masses. Stable small
venovenous shunt in the lateral segment left liver lobe. Phrygian
cap in the fundal gallbladder with possible layering tiny gallstone.
Otherwise normal gallbladder. No biliary ductal dilatation. CBD
diameter 6 mm.

Pancreas: Normal, with no mass or duct dilation.

Spleen: Normal size spleen. Subcentimeter hypodense medial splenic
lesion is too small to characterize and unchanged, considered
benign. No new splenic lesions.

Adrenals/Urinary Tract: Normal adrenals. No hydronephrosis.
Hypodense 1.6 cm posterior interpolar right renal cortical lesion is
stable in size, presumably a benign cyst. Separate simple 2.1 cm
medial interpolar right renal cyst. Normal nondistended bladder.

Stomach/Bowel: Normal non-distended stomach. Normal caliber small
bowel with no small bowel wall thickening. Normal appendix. Moderate
diffuse colonic diverticulosis with no large bowel wall thickening
or significant pericolonic fat stranding.

Vascular/Lymphatic: Atherosclerotic nonaneurysmal abdominal aorta.
IVC filter in stable position below the level of the renal veins.
Patent portal, splenic, hepatic and renal veins. No pathologically
enlarged lymph nodes in the abdomen or pelvis.

Reproductive: Status post hysterectomy, with no abnormal findings at
the vaginal cuff. No right adnexal mass. Large left pelvic sidewall
9.9 x 4.8 cm mass with heterogeneous enhancement (series 2/image 54)
invading the left iliopsoas muscle and with cortical erosion along
the anterior left iliac wing, significantly increased from 5.4 x
cm on 12/05/2018 CT.

Other: No pneumoperitoneum, ascites or focal fluid collection.

Musculoskeletal: No aggressive appearing focal osseous lesions.
Moderate lumbar spondylosis.
IMPRESSION: 1. Large left pelvic sidewall 9.9 x 4.8 cm mass with heterogeneous
enhancement, invading the left iliopsoas muscle and with cortical
erosion along the anterior left iliac wing, significantly increased
since 12/05/2018 CT, compatible with progressive metastatic disease.
2. No additional sites of metastatic disease in the abdomen or
pelvis.
3. Aortic Atherosclerosis ([id number]).
